# Patient Record
Sex: Female | Born: 1950 | Race: Black or African American | Hispanic: No | Marital: Married | Smoking: Current every day smoker
Health system: Southern US, Community
[De-identification: ages and names within clinical notes are randomized; demographics above are authoritative.]

## PROBLEM LIST (undated history)

## (undated) DIAGNOSIS — M545 Low back pain, unspecified: Secondary | ICD-10-CM

## (undated) DIAGNOSIS — I10 Essential (primary) hypertension: Secondary | ICD-10-CM

## (undated) DIAGNOSIS — F32A Depression, unspecified: Secondary | ICD-10-CM

## (undated) DIAGNOSIS — G8929 Other chronic pain: Secondary | ICD-10-CM

## (undated) DIAGNOSIS — M1990 Unspecified osteoarthritis, unspecified site: Secondary | ICD-10-CM

## (undated) DIAGNOSIS — F329 Major depressive disorder, single episode, unspecified: Secondary | ICD-10-CM

## (undated) DIAGNOSIS — E785 Hyperlipidemia, unspecified: Secondary | ICD-10-CM

---

## 1991-10-01 HISTORY — PX: LUMBAR DISC SURGERY: SHX700

## 1994-09-30 HISTORY — PX: CHOLECYSTECTOMY: SHX55

## 2003-12-06 ENCOUNTER — Encounter: Admission: RE | Admit: 2003-12-06 | Discharge: 2003-12-06 | Payer: Self-pay | Admitting: Neurosurgery

## 2003-12-21 ENCOUNTER — Encounter: Admission: RE | Admit: 2003-12-21 | Discharge: 2003-12-21 | Payer: Self-pay | Admitting: Neurosurgery

## 2004-01-10 ENCOUNTER — Encounter: Admission: RE | Admit: 2004-01-10 | Discharge: 2004-01-10 | Payer: Self-pay | Admitting: Neurosurgery

## 2004-07-19 ENCOUNTER — Ambulatory Visit: Payer: Self-pay | Admitting: Pain Medicine

## 2004-07-25 ENCOUNTER — Ambulatory Visit: Payer: Self-pay | Admitting: Pain Medicine

## 2004-08-20 ENCOUNTER — Ambulatory Visit: Payer: Self-pay | Admitting: Pain Medicine

## 2004-09-06 ENCOUNTER — Ambulatory Visit: Payer: Self-pay | Admitting: Pain Medicine

## 2004-09-17 ENCOUNTER — Ambulatory Visit: Payer: Self-pay | Admitting: Pain Medicine

## 2004-10-22 ENCOUNTER — Ambulatory Visit: Payer: Self-pay | Admitting: Pain Medicine

## 2004-11-22 ENCOUNTER — Ambulatory Visit: Payer: Self-pay | Admitting: Pain Medicine

## 2004-11-28 ENCOUNTER — Ambulatory Visit: Payer: Self-pay | Admitting: Pain Medicine

## 2005-01-08 ENCOUNTER — Ambulatory Visit: Payer: Self-pay | Admitting: Pain Medicine

## 2005-01-16 ENCOUNTER — Ambulatory Visit: Payer: Self-pay | Admitting: Pain Medicine

## 2005-02-20 ENCOUNTER — Ambulatory Visit: Payer: Self-pay | Admitting: Pain Medicine

## 2005-02-27 ENCOUNTER — Ambulatory Visit: Payer: Self-pay | Admitting: Pain Medicine

## 2005-03-08 ENCOUNTER — Encounter: Payer: Self-pay | Admitting: Pain Medicine

## 2005-03-30 ENCOUNTER — Encounter: Payer: Self-pay | Admitting: Pain Medicine

## 2005-04-09 ENCOUNTER — Ambulatory Visit: Payer: Self-pay | Admitting: Pain Medicine

## 2005-04-17 ENCOUNTER — Ambulatory Visit: Payer: Self-pay | Admitting: Pain Medicine

## 2005-04-30 ENCOUNTER — Encounter: Payer: Self-pay | Admitting: Pain Medicine

## 2005-05-09 ENCOUNTER — Ambulatory Visit: Payer: Self-pay | Admitting: Specialist

## 2005-05-16 ENCOUNTER — Ambulatory Visit: Payer: Self-pay | Admitting: Pain Medicine

## 2005-05-22 ENCOUNTER — Ambulatory Visit: Payer: Self-pay | Admitting: Pain Medicine

## 2005-06-18 ENCOUNTER — Ambulatory Visit: Payer: Self-pay | Admitting: Pain Medicine

## 2005-06-26 ENCOUNTER — Ambulatory Visit: Payer: Self-pay | Admitting: Pain Medicine

## 2005-07-18 ENCOUNTER — Ambulatory Visit: Payer: Self-pay | Admitting: Pain Medicine

## 2005-07-24 ENCOUNTER — Ambulatory Visit: Payer: Self-pay | Admitting: Pain Medicine

## 2005-08-15 ENCOUNTER — Ambulatory Visit: Payer: Self-pay | Admitting: Pain Medicine

## 2005-09-12 ENCOUNTER — Ambulatory Visit: Payer: Self-pay | Admitting: Pain Medicine

## 2005-09-25 ENCOUNTER — Ambulatory Visit: Payer: Self-pay | Admitting: Pain Medicine

## 2005-10-08 ENCOUNTER — Ambulatory Visit: Payer: Self-pay | Admitting: Pain Medicine

## 2005-10-16 ENCOUNTER — Ambulatory Visit: Payer: Self-pay | Admitting: Pain Medicine

## 2005-11-05 ENCOUNTER — Ambulatory Visit: Payer: Self-pay | Admitting: Pain Medicine

## 2005-11-13 ENCOUNTER — Ambulatory Visit: Payer: Self-pay | Admitting: Pain Medicine

## 2005-12-05 ENCOUNTER — Ambulatory Visit: Payer: Self-pay | Admitting: Pain Medicine

## 2005-12-11 ENCOUNTER — Ambulatory Visit: Payer: Self-pay | Admitting: Pain Medicine

## 2005-12-26 ENCOUNTER — Ambulatory Visit: Payer: Self-pay | Admitting: Pain Medicine

## 2006-01-20 ENCOUNTER — Ambulatory Visit: Payer: Self-pay | Admitting: Pain Medicine

## 2006-05-27 ENCOUNTER — Ambulatory Visit: Payer: Self-pay | Admitting: Pain Medicine

## 2006-05-28 ENCOUNTER — Ambulatory Visit: Payer: Self-pay | Admitting: Pain Medicine

## 2006-06-24 ENCOUNTER — Ambulatory Visit: Payer: Self-pay | Admitting: Pain Medicine

## 2006-07-21 ENCOUNTER — Ambulatory Visit: Payer: Self-pay | Admitting: Pain Medicine

## 2006-08-26 ENCOUNTER — Ambulatory Visit: Payer: Self-pay | Admitting: Pain Medicine

## 2006-09-10 ENCOUNTER — Ambulatory Visit (HOSPITAL_COMMUNITY): Admission: RE | Admit: 2006-09-10 | Discharge: 2006-09-10 | Payer: Self-pay | Admitting: Neurosurgery

## 2006-09-18 ENCOUNTER — Ambulatory Visit: Payer: Self-pay | Admitting: Pain Medicine

## 2006-10-21 ENCOUNTER — Ambulatory Visit: Payer: Self-pay | Admitting: Pain Medicine

## 2006-11-05 ENCOUNTER — Ambulatory Visit: Payer: Self-pay | Admitting: Pain Medicine

## 2006-11-18 ENCOUNTER — Ambulatory Visit: Payer: Self-pay | Admitting: Pain Medicine

## 2006-12-16 ENCOUNTER — Ambulatory Visit: Payer: Self-pay | Admitting: Pain Medicine

## 2006-12-24 ENCOUNTER — Ambulatory Visit: Payer: Self-pay | Admitting: Pain Medicine

## 2007-01-15 ENCOUNTER — Ambulatory Visit: Payer: Self-pay | Admitting: Pain Medicine

## 2007-02-11 ENCOUNTER — Ambulatory Visit: Payer: Self-pay | Admitting: Pain Medicine

## 2007-03-12 ENCOUNTER — Ambulatory Visit: Payer: Self-pay | Admitting: Pain Medicine

## 2007-04-08 ENCOUNTER — Ambulatory Visit: Payer: Self-pay | Admitting: Pain Medicine

## 2007-05-07 ENCOUNTER — Ambulatory Visit: Payer: Self-pay | Admitting: Pain Medicine

## 2007-05-13 ENCOUNTER — Ambulatory Visit: Payer: Self-pay | Admitting: Pain Medicine

## 2007-06-04 ENCOUNTER — Ambulatory Visit: Payer: Self-pay | Admitting: Pain Medicine

## 2007-07-08 ENCOUNTER — Ambulatory Visit: Payer: Self-pay | Admitting: Pain Medicine

## 2007-08-06 ENCOUNTER — Ambulatory Visit: Payer: Self-pay | Admitting: Pain Medicine

## 2007-08-12 ENCOUNTER — Ambulatory Visit: Payer: Self-pay | Admitting: Pain Medicine

## 2007-09-01 ENCOUNTER — Emergency Department: Payer: Self-pay | Admitting: Emergency Medicine

## 2007-09-10 ENCOUNTER — Ambulatory Visit: Payer: Self-pay | Admitting: Pain Medicine

## 2007-09-28 ENCOUNTER — Ambulatory Visit: Payer: Self-pay | Admitting: Pain Medicine

## 2007-10-12 ENCOUNTER — Ambulatory Visit: Payer: Self-pay | Admitting: Pain Medicine

## 2007-11-10 ENCOUNTER — Ambulatory Visit: Payer: Self-pay | Admitting: Pain Medicine

## 2007-12-10 ENCOUNTER — Ambulatory Visit: Payer: Self-pay | Admitting: Pain Medicine

## 2007-12-16 ENCOUNTER — Ambulatory Visit: Payer: Self-pay | Admitting: Pain Medicine

## 2008-01-05 ENCOUNTER — Ambulatory Visit: Payer: Self-pay | Admitting: Pain Medicine

## 2008-01-11 ENCOUNTER — Ambulatory Visit: Payer: Self-pay | Admitting: Pain Medicine

## 2008-03-08 ENCOUNTER — Ambulatory Visit: Payer: Self-pay | Admitting: Pain Medicine

## 2008-03-14 ENCOUNTER — Ambulatory Visit: Payer: Self-pay | Admitting: Pain Medicine

## 2008-04-05 ENCOUNTER — Ambulatory Visit: Payer: Self-pay | Admitting: Pain Medicine

## 2008-04-18 ENCOUNTER — Ambulatory Visit: Payer: Self-pay | Admitting: Pain Medicine

## 2008-05-03 ENCOUNTER — Ambulatory Visit: Payer: Self-pay | Admitting: Pain Medicine

## 2008-06-02 ENCOUNTER — Ambulatory Visit: Payer: Self-pay | Admitting: Pain Medicine

## 2008-06-15 ENCOUNTER — Ambulatory Visit: Payer: Self-pay | Admitting: Pain Medicine

## 2008-07-05 ENCOUNTER — Ambulatory Visit: Payer: Self-pay | Admitting: Pain Medicine

## 2008-08-04 ENCOUNTER — Ambulatory Visit: Payer: Self-pay | Admitting: Pain Medicine

## 2008-08-10 ENCOUNTER — Ambulatory Visit: Payer: Self-pay | Admitting: Pain Medicine

## 2008-08-30 ENCOUNTER — Ambulatory Visit: Payer: Self-pay | Admitting: Pain Medicine

## 2008-09-14 ENCOUNTER — Ambulatory Visit: Payer: Self-pay | Admitting: Pain Medicine

## 2008-10-03 ENCOUNTER — Ambulatory Visit: Payer: Self-pay | Admitting: Pain Medicine

## 2008-11-01 ENCOUNTER — Ambulatory Visit: Payer: Self-pay | Admitting: Pain Medicine

## 2008-11-09 ENCOUNTER — Ambulatory Visit: Payer: Self-pay | Admitting: Pain Medicine

## 2008-12-06 ENCOUNTER — Ambulatory Visit: Payer: Self-pay | Admitting: Pain Medicine

## 2008-12-28 ENCOUNTER — Ambulatory Visit: Payer: Self-pay | Admitting: Pain Medicine

## 2009-01-10 ENCOUNTER — Ambulatory Visit: Payer: Self-pay | Admitting: Family Medicine

## 2009-01-26 ENCOUNTER — Ambulatory Visit: Payer: Self-pay | Admitting: Pain Medicine

## 2009-02-28 ENCOUNTER — Ambulatory Visit: Payer: Self-pay | Admitting: Pain Medicine

## 2009-03-08 ENCOUNTER — Ambulatory Visit: Payer: Self-pay | Admitting: Pain Medicine

## 2009-03-30 ENCOUNTER — Ambulatory Visit: Payer: Self-pay | Admitting: Pain Medicine

## 2009-05-01 ENCOUNTER — Ambulatory Visit: Payer: Self-pay | Admitting: Pain Medicine

## 2009-05-30 ENCOUNTER — Ambulatory Visit: Payer: Self-pay | Admitting: Pain Medicine

## 2009-06-29 ENCOUNTER — Ambulatory Visit: Payer: Self-pay | Admitting: Pain Medicine

## 2009-07-12 ENCOUNTER — Ambulatory Visit: Payer: Self-pay | Admitting: Pain Medicine

## 2009-07-26 ENCOUNTER — Ambulatory Visit: Payer: Self-pay | Admitting: Pain Medicine

## 2009-08-07 ENCOUNTER — Ambulatory Visit: Payer: Self-pay | Admitting: Pain Medicine

## 2009-08-29 ENCOUNTER — Ambulatory Visit: Payer: Self-pay | Admitting: Pain Medicine

## 2009-08-30 ENCOUNTER — Ambulatory Visit: Payer: Self-pay | Admitting: Pain Medicine

## 2009-09-28 ENCOUNTER — Ambulatory Visit: Payer: Self-pay | Admitting: Pain Medicine

## 2009-10-24 ENCOUNTER — Ambulatory Visit: Payer: Self-pay | Admitting: Pain Medicine

## 2009-11-01 ENCOUNTER — Ambulatory Visit: Payer: Self-pay | Admitting: Pain Medicine

## 2009-11-23 ENCOUNTER — Ambulatory Visit: Payer: Self-pay | Admitting: Pain Medicine

## 2009-12-28 ENCOUNTER — Ambulatory Visit: Payer: Self-pay | Admitting: Pain Medicine

## 2010-01-16 ENCOUNTER — Ambulatory Visit: Payer: Self-pay | Admitting: Family Medicine

## 2010-01-29 ENCOUNTER — Ambulatory Visit: Payer: Self-pay | Admitting: Pain Medicine

## 2010-02-27 ENCOUNTER — Ambulatory Visit: Payer: Self-pay | Admitting: Pain Medicine

## 2010-03-09 ENCOUNTER — Encounter: Payer: Self-pay | Admitting: Pain Medicine

## 2010-03-29 ENCOUNTER — Ambulatory Visit: Payer: Self-pay | Admitting: Pain Medicine

## 2010-03-30 ENCOUNTER — Encounter: Payer: Self-pay | Admitting: Pain Medicine

## 2010-05-01 ENCOUNTER — Ambulatory Visit: Payer: Self-pay | Admitting: Pain Medicine

## 2010-05-09 ENCOUNTER — Ambulatory Visit: Payer: Self-pay | Admitting: Pain Medicine

## 2010-06-05 ENCOUNTER — Ambulatory Visit: Payer: Self-pay | Admitting: Pain Medicine

## 2010-07-03 ENCOUNTER — Ambulatory Visit: Payer: Self-pay | Admitting: Pain Medicine

## 2010-07-31 ENCOUNTER — Ambulatory Visit: Payer: Self-pay | Admitting: Pain Medicine

## 2010-08-08 ENCOUNTER — Ambulatory Visit: Payer: Self-pay | Admitting: Pain Medicine

## 2010-08-30 ENCOUNTER — Ambulatory Visit: Payer: Self-pay | Admitting: Pain Medicine

## 2010-10-02 ENCOUNTER — Ambulatory Visit: Payer: Self-pay | Admitting: Pain Medicine

## 2010-10-17 ENCOUNTER — Ambulatory Visit: Payer: Self-pay | Admitting: Pain Medicine

## 2010-10-30 ENCOUNTER — Ambulatory Visit: Payer: Self-pay | Admitting: Pain Medicine

## 2010-11-27 ENCOUNTER — Ambulatory Visit: Payer: Self-pay | Admitting: Pain Medicine

## 2010-12-05 ENCOUNTER — Ambulatory Visit: Payer: Self-pay | Admitting: Pain Medicine

## 2010-12-27 ENCOUNTER — Ambulatory Visit: Payer: Self-pay | Admitting: Pain Medicine

## 2011-01-22 ENCOUNTER — Ambulatory Visit: Payer: Self-pay | Admitting: Family Medicine

## 2011-01-24 ENCOUNTER — Ambulatory Visit: Payer: Self-pay | Admitting: Pain Medicine

## 2011-02-26 ENCOUNTER — Ambulatory Visit: Payer: Self-pay | Admitting: Pain Medicine

## 2011-03-04 ENCOUNTER — Ambulatory Visit: Payer: Self-pay | Admitting: Pain Medicine

## 2011-04-02 ENCOUNTER — Ambulatory Visit: Payer: Self-pay | Admitting: Pain Medicine

## 2011-04-16 ENCOUNTER — Ambulatory Visit: Payer: Self-pay | Admitting: Pain Medicine

## 2011-04-30 ENCOUNTER — Ambulatory Visit: Payer: Self-pay | Admitting: Pain Medicine

## 2011-05-06 ENCOUNTER — Ambulatory Visit: Payer: Self-pay | Admitting: Pain Medicine

## 2011-05-28 ENCOUNTER — Ambulatory Visit: Payer: Self-pay | Admitting: Pain Medicine

## 2011-06-27 ENCOUNTER — Ambulatory Visit: Payer: Self-pay | Admitting: Pain Medicine

## 2011-07-03 ENCOUNTER — Ambulatory Visit: Payer: Self-pay | Admitting: Pain Medicine

## 2011-07-25 ENCOUNTER — Ambulatory Visit: Payer: Self-pay | Admitting: Pain Medicine

## 2011-08-29 ENCOUNTER — Ambulatory Visit: Payer: Self-pay | Admitting: Pain Medicine

## 2011-10-14 ENCOUNTER — Encounter (HOSPITAL_COMMUNITY): Payer: Self-pay | Admitting: Emergency Medicine

## 2011-10-14 ENCOUNTER — Observation Stay (HOSPITAL_COMMUNITY)
Admission: EM | Admit: 2011-10-14 | Discharge: 2011-10-15 | Disposition: A | Discharge status: Home / Self Care | Payer: Medicare Other | Attending: Emergency Medicine | Admitting: Emergency Medicine

## 2011-10-14 DIAGNOSIS — M549 Dorsalgia, unspecified: Secondary | ICD-10-CM | POA: Insufficient documentation

## 2011-10-14 DIAGNOSIS — R112 Nausea with vomiting, unspecified: Principal | ICD-10-CM

## 2011-10-14 DIAGNOSIS — I10 Essential (primary) hypertension: Secondary | ICD-10-CM | POA: Insufficient documentation

## 2011-10-14 DIAGNOSIS — E860 Dehydration: Secondary | ICD-10-CM

## 2011-10-14 DIAGNOSIS — G8929 Other chronic pain: Secondary | ICD-10-CM | POA: Insufficient documentation

## 2011-10-14 DIAGNOSIS — E119 Type 2 diabetes mellitus without complications: Secondary | ICD-10-CM | POA: Insufficient documentation

## 2011-10-14 HISTORY — DX: Depression, unspecified: F32.A

## 2011-10-14 HISTORY — DX: Essential (primary) hypertension: I10

## 2011-10-14 HISTORY — DX: Major depressive disorder, single episode, unspecified: F32.9

## 2011-10-14 LAB — DIFFERENTIAL
Basophils Absolute: 0.1 10*3/uL (ref 0.0–0.1)
Basophils Relative: 1 % (ref 0–1)
Eosinophils Absolute: 0.3 10*3/uL (ref 0.0–0.7)
Eosinophils Relative: 3 % (ref 0–5)
Lymphocytes Relative: 29 % (ref 12–46)
Lymphs Abs: 2.9 K/uL (ref 0.7–4.0)
Monocytes Absolute: 0.5 K/uL (ref 0.1–1.0)
Monocytes Relative: 5 % (ref 3–12)
Neutro Abs: 6.2 10*3/uL (ref 1.7–7.7)
Neutrophils Relative %: 62 % (ref 43–77)

## 2011-10-14 LAB — URINALYSIS, ROUTINE W REFLEX MICROSCOPIC
Bilirubin Urine: NEGATIVE
Glucose, UA: 250 mg/dL — AB
Hgb urine dipstick: NEGATIVE
Ketones, ur: 15 mg/dL — AB
Leukocytes, UA: NEGATIVE
Nitrite: NEGATIVE
Protein, ur: NEGATIVE mg/dL
Specific Gravity, Urine: 1.014 (ref 1.005–1.030)
Urobilinogen, UA: 0.2 mg/dL (ref 0.0–1.0)
pH: 5 (ref 5.0–8.0)

## 2011-10-14 LAB — BASIC METABOLIC PANEL WITH GFR
BUN: 16 mg/dL (ref 6–23)
Creatinine, Ser: 1.17 mg/dL — ABNORMAL HIGH (ref 0.50–1.10)
GFR calc Af Amer: 57 mL/min — ABNORMAL LOW (ref >90)
GFR calc non Af Amer: 50 mL/min — ABNORMAL LOW (ref >90)
Potassium: 3.5 meq/L (ref 3.5–5.1)

## 2011-10-14 LAB — BASIC METABOLIC PANEL
CO2: 29 mEq/L (ref 19–32)
Calcium: 9.7 mg/dL (ref 8.4–10.5)
Chloride: 92 mEq/L — ABNORMAL LOW (ref 96–112)
Glucose, Bld: 241 mg/dL — ABNORMAL HIGH (ref 70–99)
Sodium: 134 mEq/L — ABNORMAL LOW (ref 135–145)

## 2011-10-14 LAB — CBC
HCT: 41.4 % (ref 36.0–46.0)
Hemoglobin: 14.2 g/dL (ref 12.0–15.0)
MCH: 30.3 pg (ref 26.0–34.0)
MCHC: 34.3 g/dL (ref 30.0–36.0)
MCV: 88.3 fL (ref 78.0–100.0)
Platelets: 292 K/uL (ref 150–400)
RBC: 4.69 MIL/uL (ref 3.87–5.11)
RDW: 12.7 % (ref 11.5–15.5)
WBC: 9.9 K/uL (ref 4.0–10.5)

## 2011-10-14 LAB — GLUCOSE, CAPILLARY: Glucose-Capillary: 214 mg/dL — ABNORMAL HIGH (ref 70–99)

## 2011-10-14 MED ORDER — ONDANSETRON HCL 4 MG/2ML IJ SOLN
4.0000 mg | Freq: Once | INTRAMUSCULAR | Status: AC
  Administered 2011-10-14: 4 mg via INTRAVENOUS
  Filled 2011-10-14: qty 2

## 2011-10-14 MED ORDER — OLMESARTAN-AMLODIPINE-HCTZ 40-10-25 MG PO TABS: 1.0000 | ORAL_TABLET | Freq: Every day | ORAL | Status: DC

## 2011-10-14 MED ORDER — ONDANSETRON HCL 4 MG/2ML IJ SOLN: 4.0000 mg | Freq: Four times a day (QID) | INTRAMUSCULAR | Status: DC | PRN

## 2011-10-14 MED ORDER — DULOXETINE HCL 60 MG PO CPEP
60.0000 mg | ORAL_CAPSULE | ORAL | Status: AC
  Administered 2011-10-14: 60 mg via ORAL
  Filled 2011-10-14: qty 1

## 2011-10-14 MED ORDER — INSULIN DETEMIR 100 UNIT/ML ~~LOC~~ SOLN
60.0000 [IU] | SUBCUTANEOUS | Status: AC
  Administered 2011-10-14: 60 [IU] via SUBCUTANEOUS
  Filled 2011-10-14 (×2): qty 3

## 2011-10-14 MED ORDER — AMLODIPINE BESYLATE 10 MG PO TABS
10.0000 mg | ORAL_TABLET | ORAL | Status: AC
  Administered 2011-10-14: 10 mg via ORAL
  Filled 2011-10-14: qty 1

## 2011-10-14 MED ORDER — SODIUM CHLORIDE 0.9 % IV BOLUS (SEPSIS)
1000.0000 mL | Freq: Once | INTRAVENOUS | Status: AC
  Administered 2011-10-14: 1000 mL via INTRAVENOUS

## 2011-10-14 MED ORDER — ASPIRIN EC 81 MG PO TBEC
81.0000 mg | DELAYED_RELEASE_TABLET | ORAL | Status: AC
  Administered 2011-10-14: 81 mg via ORAL
  Filled 2011-10-14: qty 1

## 2011-10-14 MED ORDER — OLMESARTAN MEDOXOMIL 40 MG PO TABS
40.0000 mg | ORAL_TABLET | ORAL | Status: AC
  Administered 2011-10-14: 40 mg via ORAL
  Filled 2011-10-14: qty 1

## 2011-10-14 MED ORDER — HYDROCHLOROTHIAZIDE 25 MG PO TABS
25.0000 mg | ORAL_TABLET | ORAL | Status: AC
  Administered 2011-10-14: 25 mg via ORAL
  Filled 2011-10-14: qty 1

## 2011-10-14 MED ORDER — METFORMIN HCL 500 MG PO TABS
1000.0000 mg | ORAL_TABLET | ORAL | Status: AC
  Administered 2011-10-14: 1000 mg via ORAL
  Filled 2011-10-14: qty 2

## 2011-10-14 MED ORDER — SODIUM CHLORIDE 0.9 % IV SOLN
INTRAVENOUS | Status: DC
  Administered 2011-10-14 – 2011-10-15 (×2): via INTRAVENOUS

## 2011-10-14 MED ORDER — FAMOTIDINE IN NACL 20-0.9 MG/50ML-% IV SOLN
20.0000 mg | Freq: Once | INTRAVENOUS | Status: AC
  Administered 2011-10-14: 20 mg via INTRAVENOUS
  Filled 2011-10-14: qty 50

## 2011-10-14 MED ORDER — SIMVASTATIN 20 MG PO TABS
20.0000 mg | ORAL_TABLET | ORAL | Status: AC
  Administered 2011-10-14: 20 mg via ORAL
  Filled 2011-10-14: qty 1

## 2011-10-14 MED ORDER — ACETAMINOPHEN 325 MG PO TABS: 650.0000 mg | ORAL_TABLET | ORAL | Status: DC | PRN

## 2011-10-14 NOTE — ED Provider Notes (Signed)
History     CSN: [id number]  Arrival date & time 10/14/11  1526   First MD Initiated Contact with Patient 10/14/11 1859      Chief Complaint  Patient presents with  . Emesis    (Consider location/radiation/quality/duration/timing/severity/associated sxs/prior treatment) HPI Comments: Pt with N/V/D for past 5 days.  No sig abd pain.  Pt has chronic back pain, not worse than usual.  Possibly chills at home, no known fevers.  No CP, SOB, coughing or URI symptoms.  Pt has been able to keep down some oranges, but not for long by her report.  She denies foreign travel, no recent abx use.  She has not taken any specific medications at home for her symptoms.  She has sig h/o HTN and DM as well as chronic pain.  In the last 24 hours, she has only had 2 episodes of vomiting and 1 loose stool which she attributes to not eating much recently.  Her PCP is in [place] with the [facility].  The history is provided by the patient, a relative and the spouse.    Past Medical History  Diagnosis Date  . Hypertension   . Diabetes mellitus   . Back pain     Past Surgical History  Procedure Date  . Back surgery     No family history on file.  History  Substance Use Topics  . Smoking status: Current Everyday Smoker  . Smokeless tobacco: Not on file  . Alcohol Use: No    OB History    Grav Para Term Preterm Abortions TAB SAB Ect Mult Living                  Review of Systems  Constitutional: Positive for appetite change and fatigue.  HENT: Negative for congestion and rhinorrhea.   Respiratory: Negative for cough, chest tightness and shortness of breath.   Cardiovascular: Negative for chest pain.  Gastrointestinal: Positive for nausea, vomiting and diarrhea. Negative for abdominal pain.  Musculoskeletal: Positive for back pain.  Skin: Negative for rash and wound.  Neurological: Negative for headaches.  All other systems reviewed and are negative.    Allergies  Aleve;  Iodine; Neurontin; and Voltaren  Home Medications   Current Outpatient Rx  Name Route Sig Dispense Refill  . ASPIRIN EC 81 MG PO TBEC Oral Take 81 mg by mouth daily.    . DORZOLAMIDE HCL-TIMOLOL MAL 22.3-6.8 MG/ML OP SOLN Both Eyes Place 1 drop into both eyes 2 (two) times daily.    . DULOXETINE HCL 60 MG PO CPEP Oral Take 60 mg by mouth daily.    . FENTANYL 75 MCG/HR TD PT72 Transdermal Place 1 patch onto the skin every 3 (three) days.    . INSULIN DETEMIR 100 UNIT/ML [place] SOLN Subcutaneous Inject 60 Units into the skin at bedtime.    Marland Kitchen METFORMIN HCL 1000 MG PO TABS Oral Take 1,000 mg by mouth 2 (two) times daily with a meal.    . OLMESARTAN-AMLODIPINE-HCTZ 40-10-25 MG PO TABS Oral Take 1 tablet by mouth daily.    . OXYCODONE HCL (ABUSE DETER) 5 MG PO TABS Oral Take 5-10 mg by mouth every 6 (six) hours as needed. For breakthrough pain    . PRAVASTATIN SODIUM 40 MG PO TABS Oral Take 40 mg by mouth daily.      BP 131/78  Pulse 84  Temp(Src) 97.9 F (36.6 C) (Oral)  Resp 16  SpO2 98%  Physical Exam  Nursing note and  vitals reviewed. Constitutional: She appears well-developed and well-nourished.  HENT:  Head: Normocephalic and atraumatic.  Mouth/Throat: Uvula is midline. Mucous membranes are not pale and dry.  Eyes: Pupils are equal, round, and reactive to light. No scleral icterus.  Cardiovascular: Normal rate.   No murmur heard. Pulmonary/Chest: Effort normal. No respiratory distress. She has no wheezes. She has no rales.  Abdominal: Soft. Bowel sounds are increased. There is no tenderness. There is no rebound, no guarding and no CVA tenderness.  Skin: Skin is warm, dry and intact. No rash noted.    ED Course  Procedures (including critical care time)  Labs Reviewed  BASIC METABOLIC PANEL - Abnormal; Notable for the following:    Sodium 134 (*)    Chloride 92 (*)    Glucose, Bld 241 (*)    Creatinine, Ser 1.17 (*)    GFR calc non Af Amer 50 (*)    GFR calc Af Amer 57 (*)     All other components within normal limits  CBC  DIFFERENTIAL  URINALYSIS, ROUTINE W REFLEX MICROSCOPIC   No results found.   No diagnosis found.    MDM  Pt has soft abodmen, no guard or rebound.  Pt is dehydrated on exam.  Pt placed in CDU under dehydration protocol and discussed with PAC Narvaez to follow  And reassess after IVF's and diet is advanced slowly.          Gavin Pound. Oletta Lamas, MD 10/14/11 1958

## 2011-10-14 NOTE — ED Notes (Signed)
Pt st's she has not been able to keep any food down for over 1 week.  St's she has had vomiting and diarrhea.   Also c/o bil feet being swollen for past 4 days.  Family at bedside.  Pt denies any pain

## 2011-10-14 NOTE — ED Notes (Signed)
Pt c/o n/v/d with inability to keep po foods down x 1 week. Pt states last instance of nvd was on Friday. Currently denies pain. Denies nausea or other urinary sx. Pt in no acute distress.

## 2011-10-14 NOTE — ED Notes (Signed)
PT. REPORTS VOMITTING WITH DIARRHEA FOR 4 DAYS WITH FEET SWELLING , GENERALIZED WEAKNESS , NO FEVER OR CHILLS.

## 2011-10-14 NOTE — ED Notes (Signed)
Patient denies pain and is resting comfortably.  

## 2011-10-15 LAB — GLUCOSE, CAPILLARY: Glucose-Capillary: 183 mg/dL — ABNORMAL HIGH (ref 70–99)

## 2011-10-15 MED ORDER — ONDANSETRON HCL 4 MG PO TABS: 4.0000 mg | ORAL_TABLET | Freq: Three times a day (TID) | ORAL | Status: AC | PRN

## 2011-10-15 NOTE — ED Notes (Signed)
Clear liquid tray ordered 

## 2011-10-15 NOTE — ED Provider Notes (Signed)
61 year old female currently in the CDU under dehydration protocol, due to nausea vomiting and diarrhea for the past 4 days. While in the CDU patient is receiving IV fluid and slowly advanced to soft diet. She is able to tolerate this by mouth without difficulty at this time. Patient states she feels much better. She denies any abdominal pain. Patient requests to be discharged. Patient agrees to follow up with her primary care Dr. for further evaluation.  Fayrene Helper, PA-C 10/15/11 [phone]

## 2011-10-15 NOTE — ED Notes (Signed)
Pt up to restroom with her cane. Gait steady.

## 2011-10-18 NOTE — ED Provider Notes (Signed)
History/physical exam/procedure(s) were performed by non-physician practitioner and as supervising physician I was immediately available for consultation/collaboration. I have reviewed all notes and am in agreement with care and plan.   Hilario Quarry, MD 10/18/11 [phone]

## 2011-10-22 ENCOUNTER — Ambulatory Visit: Payer: Self-pay | Admitting: Pain Medicine

## 2011-10-30 ENCOUNTER — Ambulatory Visit: Payer: Self-pay | Admitting: Pain Medicine

## 2011-11-28 ENCOUNTER — Ambulatory Visit: Payer: Self-pay | Admitting: Pain Medicine

## 2011-12-13 ENCOUNTER — Ambulatory Visit: Payer: Self-pay | Admitting: Gastroenterology

## 2011-12-26 ENCOUNTER — Ambulatory Visit: Payer: Self-pay | Admitting: Pain Medicine

## 2012-01-08 ENCOUNTER — Ambulatory Visit: Payer: Self-pay | Admitting: Pain Medicine

## 2012-01-28 ENCOUNTER — Ambulatory Visit: Payer: Self-pay | Admitting: Pain Medicine

## 2012-02-05 ENCOUNTER — Ambulatory Visit: Payer: Self-pay | Admitting: Pain Medicine

## 2012-02-25 ENCOUNTER — Ambulatory Visit: Payer: Self-pay | Admitting: Pain Medicine

## 2012-03-02 ENCOUNTER — Ambulatory Visit: Payer: Self-pay | Admitting: Pain Medicine

## 2012-03-26 ENCOUNTER — Ambulatory Visit: Payer: Self-pay | Admitting: Pain Medicine

## 2012-04-09 ENCOUNTER — Ambulatory Visit: Payer: Self-pay | Admitting: Family Medicine

## 2012-04-10 ENCOUNTER — Ambulatory Visit: Payer: Self-pay | Admitting: Family Medicine

## 2012-04-23 ENCOUNTER — Ambulatory Visit: Payer: Self-pay | Admitting: Family Medicine

## 2012-04-28 ENCOUNTER — Ambulatory Visit: Payer: Self-pay | Admitting: Pain Medicine

## 2012-04-30 ENCOUNTER — Ambulatory Visit: Payer: Self-pay | Admitting: Family Medicine

## 2012-05-26 ENCOUNTER — Ambulatory Visit: Payer: Self-pay | Admitting: Pain Medicine

## 2012-05-31 ENCOUNTER — Ambulatory Visit: Payer: Self-pay | Admitting: Family Medicine

## 2012-06-16 ENCOUNTER — Ambulatory Visit: Payer: Self-pay | Admitting: Pain Medicine

## 2012-06-22 ENCOUNTER — Ambulatory Visit: Payer: Self-pay | Admitting: Pain Medicine

## 2012-07-03 ENCOUNTER — Ambulatory Visit: Payer: Self-pay | Admitting: Pain Medicine

## 2012-07-23 ENCOUNTER — Ambulatory Visit: Payer: Self-pay | Admitting: Pain Medicine

## 2012-08-05 ENCOUNTER — Ambulatory Visit: Payer: Self-pay | Admitting: Pain Medicine

## 2012-09-02 ENCOUNTER — Ambulatory Visit: Payer: Self-pay | Admitting: Pain Medicine

## 2012-09-28 ENCOUNTER — Ambulatory Visit: Payer: Self-pay | Admitting: Pain Medicine

## 2012-10-26 ENCOUNTER — Ambulatory Visit: Payer: Self-pay | Admitting: Family Medicine

## 2012-10-29 ENCOUNTER — Ambulatory Visit: Payer: Self-pay | Admitting: Pain Medicine

## 2012-12-24 ENCOUNTER — Ambulatory Visit: Payer: Self-pay | Admitting: Pain Medicine

## 2013-01-21 ENCOUNTER — Ambulatory Visit: Payer: Self-pay | Admitting: Pain Medicine

## 2013-02-23 ENCOUNTER — Ambulatory Visit: Payer: Self-pay | Admitting: Pain Medicine

## 2013-04-20 ENCOUNTER — Ambulatory Visit: Payer: Self-pay | Admitting: Pain Medicine

## 2013-05-18 ENCOUNTER — Ambulatory Visit: Payer: Self-pay | Admitting: Pain Medicine

## 2013-06-16 ENCOUNTER — Ambulatory Visit: Payer: Self-pay | Admitting: Pain Medicine

## 2013-07-19 ENCOUNTER — Ambulatory Visit: Payer: Self-pay | Admitting: Pain Medicine

## 2013-08-19 ENCOUNTER — Ambulatory Visit: Payer: Self-pay | Admitting: Pain Medicine

## 2013-08-27 ENCOUNTER — Ambulatory Visit: Payer: Self-pay | Admitting: Family Medicine

## 2013-08-30 ENCOUNTER — Encounter (HOSPITAL_COMMUNITY): Payer: Self-pay | Admitting: Emergency Medicine

## 2013-08-30 ENCOUNTER — Emergency Department (HOSPITAL_COMMUNITY): Payer: Medicare Other

## 2013-08-30 ENCOUNTER — Emergency Department (HOSPITAL_COMMUNITY)
Admission: EM | Admit: 2013-08-30 | Discharge: 2013-08-31 | Disposition: A | Discharge status: Home / Self Care | Payer: Medicare Other | Attending: Emergency Medicine | Admitting: Emergency Medicine

## 2013-08-30 DIAGNOSIS — Z79899 Other long term (current) drug therapy: Secondary | ICD-10-CM | POA: Insufficient documentation

## 2013-08-30 DIAGNOSIS — F17200 Nicotine dependence, unspecified, uncomplicated: Secondary | ICD-10-CM | POA: Insufficient documentation

## 2013-08-30 DIAGNOSIS — Z8739 Personal history of other diseases of the musculoskeletal system and connective tissue: Secondary | ICD-10-CM | POA: Insufficient documentation

## 2013-08-30 DIAGNOSIS — Z7982 Long term (current) use of aspirin: Secondary | ICD-10-CM | POA: Insufficient documentation

## 2013-08-30 DIAGNOSIS — I10 Essential (primary) hypertension: Secondary | ICD-10-CM | POA: Insufficient documentation

## 2013-08-30 DIAGNOSIS — Z794 Long term (current) use of insulin: Secondary | ICD-10-CM | POA: Insufficient documentation

## 2013-08-30 DIAGNOSIS — N200 Calculus of kidney: Secondary | ICD-10-CM | POA: Insufficient documentation

## 2013-08-30 DIAGNOSIS — R112 Nausea with vomiting, unspecified: Secondary | ICD-10-CM | POA: Insufficient documentation

## 2013-08-30 DIAGNOSIS — E119 Type 2 diabetes mellitus without complications: Secondary | ICD-10-CM | POA: Insufficient documentation

## 2013-08-30 DIAGNOSIS — Z8659 Personal history of other mental and behavioral disorders: Secondary | ICD-10-CM | POA: Insufficient documentation

## 2013-08-30 LAB — CBC WITH DIFFERENTIAL/PLATELET
Basophils Absolute: 0.1 10*3/uL (ref 0.0–0.1)
Eosinophils Relative: 3 % (ref 0–5)
HCT: 40.7 % (ref 36.0–46.0)
Lymphocytes Relative: 38 % (ref 12–46)
MCH: 30.1 pg (ref 26.0–34.0)
MCHC: 34.9 g/dL (ref 30.0–36.0)
MCV: 86.2 fL (ref 78.0–100.0)
Monocytes Absolute: 0.6 10*3/uL (ref 0.1–1.0)
RDW: 13.3 % (ref 11.5–15.5)
WBC: 9.4 10*3/uL (ref 4.0–10.5)

## 2013-08-30 LAB — URINALYSIS, ROUTINE W REFLEX MICROSCOPIC
Bilirubin Urine: NEGATIVE
Ketones, ur: NEGATIVE mg/dL
Nitrite: NEGATIVE
pH: 6 (ref 5.0–8.0)

## 2013-08-30 LAB — BASIC METABOLIC PANEL
BUN: 21 mg/dL (ref 6–23)
CO2: 25 mEq/L (ref 19–32)
Chloride: 97 mEq/L (ref 96–112)
Creatinine, Ser: 1.49 mg/dL — ABNORMAL HIGH (ref 0.50–1.10)
Glucose, Bld: 182 mg/dL — ABNORMAL HIGH (ref 70–99)

## 2013-08-30 LAB — URINE MICROSCOPIC-ADD ON

## 2013-08-30 MED ORDER — MORPHINE SULFATE 4 MG/ML IJ SOLN
4.0000 mg | Freq: Once | INTRAMUSCULAR | Status: AC
  Administered 2013-08-30: 4 mg via INTRAVENOUS
  Filled 2013-08-30: qty 1

## 2013-08-30 MED ORDER — SODIUM CHLORIDE 0.9 % IV BOLUS (SEPSIS)
1000.0000 mL | Freq: Once | INTRAVENOUS | Status: AC
  Administered 2013-08-30: 1000 mL via INTRAVENOUS

## 2013-08-30 MED ORDER — ONDANSETRON HCL 4 MG/2ML IJ SOLN
4.0000 mg | Freq: Once | INTRAMUSCULAR | Status: AC
  Administered 2013-08-30: 4 mg via INTRAVENOUS
  Filled 2013-08-30: qty 2

## 2013-08-30 NOTE — ED Notes (Signed)
Pt c/o left flank pain x 3 wks; CT scan on Friday--told tonight has kidney stone right side; scheduled for MD appt on 12/11; states started vomiting today; no fever

## 2013-08-30 NOTE — ED Provider Notes (Signed)
CSN: [id number]     Arrival date & time 08/30/13  1908 History   First MD Initiated Contact with Patient 08/30/13 2109     Chief Complaint  Patient presents with  . Flank Pain   (Consider location/radiation/quality/duration/timing/severity/associated sxs/prior Treatment) HPI  This is a 62 year old female who presents with flank pain. She reports 3 weeks of right-sided flank pain. She had a CT scan on Friday and was told she had a kidney stone. She is unsure of how big it is. She has an appointment with urology on December 11. She reports nonbilious, nonbloody emesis today. She denies any fevers. She reports persistent pain despite using oxycodone. She denies any hematuria or dysuria. Patient currently reports her pain as 9/10.  Past Medical History  Diagnosis Date  . Hypertension   . Diabetes mellitus   . Back pain   . Depression    Past Surgical History  Procedure Laterality Date  . Back surgery     No family history on file. History  Substance Use Topics  . Smoking status: Current Every Day Smoker  . Smokeless tobacco: Not on file  . Alcohol Use: No   OB History   Grav Para Term Preterm Abortions TAB SAB Ect Mult Living                 Review of Systems  Constitutional: Negative for fever.  Respiratory: Negative for cough, chest tightness and shortness of breath.   Cardiovascular: Negative for chest pain.  Gastrointestinal: Positive for nausea and vomiting. Negative for abdominal pain and diarrhea.  Genitourinary: Positive for flank pain. Negative for dysuria and hematuria.  Neurological: Negative for headaches.  All other systems reviewed and are negative.    Allergies  Diclofenac sodium; Iodine; Naproxen sodium; and Neurontin  Home Medications   Current Outpatient Rx  Name  Route  Sig  Dispense  Refill  . aspirin EC 81 MG tablet   Oral   Take 81 mg by mouth daily.         . carvedilol (COREG) 3.125 MG tablet   Oral   Take 3.125 mg by mouth 2 (two)  times daily with a meal.         . dorzolamide-timolol (COSOPT) 22.3-6.8 MG/ML ophthalmic solution   Both Eyes   Place 1 drop into both eyes 2 (two) times daily.         . fentaNYL (DURAGESIC - DOSED MCG/HR) 100 MCG/HR   Transdermal   Place 100 mcg onto the skin every 3 (three) days. Used with patch.         . fentaNYL (DURAGESIC - DOSED MCG/HR) 75 MCG/HR   Transdermal   Place 1 patch onto the skin every 3 (three) days. Used with patch         . Insulin Glargine (LANTUS SOLOSTAR) 100 UNIT/ML SOPN   Subcutaneous   Inject 60 Units into the skin every morning.         . metFORMIN (GLUCOPHAGE) 1000 MG tablet   Oral   Take 1,000 mg by mouth 2 (two) times daily with a meal.         . Multiple Vitamins-Minerals (MULTIVITAMIN WITH MINERALS) tablet   Oral   Take 1 tablet by mouth daily.         . Olmesartan-Amlodipine-HCTZ (TRIBENZOR) 40-10-25 MG TABS   Oral   Take 1 tablet by mouth daily.         . Oxycodone HCl 10 MG TABS  Oral   Take 10-20 mg by mouth every 4 (four) hours as needed (breakthrough pain).         . pravastatin (PRAVACHOL) 40 MG tablet   Oral   Take 40 mg by mouth daily.         . saxagliptin HCl (ONGLYZA) 5 MG TABS tablet   Oral   Take 5 mg by mouth daily.         . tamsulosin (FLOMAX) 0.4 MG CAPS capsule   Oral   Take 1 capsule (0.4 mg total) by mouth daily.   30 capsule   0    BP 133/74  Pulse 81  Temp(Src) 98.5 F (36.9 C) (Oral)  Resp 20  SpO2 97% Physical Exam  Nursing note and vitals reviewed. Constitutional: She is oriented to person, place, and time. She appears well-developed and well-nourished.  HENT:  Head: Normocephalic and atraumatic.  Eyes: Pupils are equal, round, and reactive to light.  Neck: Neck supple.  Cardiovascular: Normal rate, regular rhythm and normal heart sounds.   No murmur heard. Pulmonary/Chest: Effort normal. No respiratory distress.  Abdominal: Soft. Bowel sounds are normal.  She exhibits no distension. There is no tenderness. There is no rebound and no guarding.  Genitourinary:  No CVA tenderness  Neurological: She is alert and oriented to person, place, and time.  Skin: Skin is warm and dry.  Psychiatric: She has a normal mood and affect.    ED Course  Procedures (including critical care time) Labs Review Labs Reviewed  BASIC METABOLIC PANEL - Abnormal; Notable for the following:    Glucose, Bld 182 (*)    Creatinine, Ser 1.49 (*)    GFR calc non Af Amer 36 (*)    GFR calc Af Amer 42 (*)    All other components within normal limits  URINALYSIS, ROUTINE W REFLEX MICROSCOPIC - Abnormal; Notable for the following:    APPearance CLOUDY (*)    Glucose, UA 100 (*)    Leukocytes, UA TRACE (*)    All other components within normal limits  URINE MICROSCOPIC-ADD ON - Abnormal; Notable for the following:    Squamous Epithelial / LPF MANY (*)    Bacteria, UA FEW (*)    All other components within normal limits  CBC WITH DIFFERENTIAL   Imaging Review Ct Abdomen Pelvis Wo Contrast  08/30/2013   CLINICAL DATA:  Worsening right-sided flank pain.  EXAM: CT ABDOMEN AND PELVIS WITHOUT CONTRAST  TECHNIQUE: Multidetector CT imaging of the abdomen and pelvis was performed following the standard protocol without intravenous contrast.  COMPARISON:  None.  FINDINGS: Lower Chest: Clear lung bases. Mild cardiomegaly without pericardial or pleural effusion.  Abdomen/Pelvis: Normal liver, spleen, stomach. Mild pancreatic atrophy. Cholecystectomy without biliary ductal dilatation. Normal adrenal glands. 6 mm low-density left renal lesion which is likely a cyst. Mild right renal atrophy. No hydronephrosis. No hydroureter or ureteric calculi. A right-sided bladder base or ureterovesicular junction stone measures 1.0 cm on image 66.  Advanced aortic and branch vessel atherosclerosis. No retroperitoneal or retrocrural adenopathy. Normal colon, appendix, and terminal ileum. Normal small  bowel without abdominal ascites. No pelvic adenopathy. Normal uterus and adnexa, without significant free pelvic fluid.  Bones/Musculoskeletal: Advanced bilateral hip osteoarthritis. 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  IMPRESSION: 1. 1.0 cm right bladder base or ureterovesicular junction calculus. No significant urinary tract obstruction. 2. Advanced aortic and branch vessel atherosclerosis.   Electronically Signed   By: Jeronimo Greaves M.D.   On: 08/30/2013 23:30  EKG Interpretation   None       MDM   1. Kidney stone    Patient presents with persistent right-sided flank pain. Was diagnosed with a kidney stone on Friday but states she's had worsening of pain. She is nontoxic-appearing on exam. Vital signs are within normal limits. Lab work is notable for mild bump in the patient's creatinine.  I do not have access to the patient's CT scan nor do I know how big the stone is. Given this, patient will be sent for another CT scan. CT scan shows a 1 cm at the bladder or UVJ junction. Patient has had complete resolution of her pain while in the ER. I suspect she may have passed a stone into her bladder. However, given the size of the stone will touch base with urology. Anticipate the patient to be discharged home with urology followup.    Shon Baton, MD 08/31/13 [phone]

## 2013-08-31 MED ORDER — TAMSULOSIN HCL 0.4 MG PO CAPS: 0.4000 mg | ORAL_CAPSULE | Freq: Every day | ORAL | Status: DC

## 2013-09-01 ENCOUNTER — Other Ambulatory Visit: Payer: Self-pay

## 2013-09-01 ENCOUNTER — Ambulatory Visit (HOSPITAL_BASED_OUTPATIENT_CLINIC_OR_DEPARTMENT_OTHER)
Admission: RE | Admit: 2013-09-01 | Discharge: 2013-09-01 | Disposition: A | Discharge status: Home / Self Care | Payer: Medicare Other | Source: Ambulatory Visit | Attending: Urology | Admitting: Urology

## 2013-09-01 ENCOUNTER — Ambulatory Visit (HOSPITAL_BASED_OUTPATIENT_CLINIC_OR_DEPARTMENT_OTHER): Payer: Medicare Other | Admitting: Anesthesiology

## 2013-09-01 ENCOUNTER — Encounter (HOSPITAL_BASED_OUTPATIENT_CLINIC_OR_DEPARTMENT_OTHER): Payer: Self-pay | Admitting: *Deleted

## 2013-09-01 ENCOUNTER — Other Ambulatory Visit: Payer: Self-pay | Admitting: Urology

## 2013-09-01 ENCOUNTER — Encounter (HOSPITAL_BASED_OUTPATIENT_CLINIC_OR_DEPARTMENT_OTHER): Payer: Medicare Other | Admitting: Anesthesiology

## 2013-09-01 ENCOUNTER — Encounter (HOSPITAL_BASED_OUTPATIENT_CLINIC_OR_DEPARTMENT_OTHER)
Admission: RE | Disposition: A | Discharge status: Home / Self Care | Payer: Self-pay | Source: Ambulatory Visit | Attending: Urology

## 2013-09-01 DIAGNOSIS — N210 Calculus in bladder: Secondary | ICD-10-CM | POA: Insufficient documentation

## 2013-09-01 DIAGNOSIS — Z7982 Long term (current) use of aspirin: Secondary | ICD-10-CM | POA: Insufficient documentation

## 2013-09-01 DIAGNOSIS — Q6231 Congenital ureterocele, orthotopic: Secondary | ICD-10-CM | POA: Insufficient documentation

## 2013-09-01 DIAGNOSIS — I10 Essential (primary) hypertension: Secondary | ICD-10-CM | POA: Insufficient documentation

## 2013-09-01 DIAGNOSIS — E7800 Pure hypercholesterolemia, unspecified: Secondary | ICD-10-CM | POA: Insufficient documentation

## 2013-09-01 DIAGNOSIS — Z79899 Other long term (current) drug therapy: Secondary | ICD-10-CM | POA: Insufficient documentation

## 2013-09-01 DIAGNOSIS — F17200 Nicotine dependence, unspecified, uncomplicated: Secondary | ICD-10-CM | POA: Insufficient documentation

## 2013-09-01 DIAGNOSIS — Z794 Long term (current) use of insulin: Secondary | ICD-10-CM | POA: Insufficient documentation

## 2013-09-01 DIAGNOSIS — N201 Calculus of ureter: Secondary | ICD-10-CM | POA: Insufficient documentation

## 2013-09-01 DIAGNOSIS — E119 Type 2 diabetes mellitus without complications: Secondary | ICD-10-CM | POA: Insufficient documentation

## 2013-09-01 HISTORY — DX: Low back pain, unspecified: M54.50

## 2013-09-01 HISTORY — DX: Unspecified osteoarthritis, unspecified site: M19.90

## 2013-09-01 HISTORY — DX: Other chronic pain: G89.29

## 2013-09-01 HISTORY — PX: CYSTOSCOPY WITH URETEROSCOPY: SHX5123

## 2013-09-01 HISTORY — DX: Low back pain: M54.5

## 2013-09-01 HISTORY — DX: Hyperlipidemia, unspecified: E78.5

## 2013-09-01 LAB — POCT I-STAT 4, (NA,K, GLUC, HGB,HCT)
HCT: 41 % (ref 36.0–46.0)
Hemoglobin: 13.9 g/dL (ref 12.0–15.0)
Sodium: 136 mEq/L (ref 135–145)

## 2013-09-01 LAB — GLUCOSE, CAPILLARY: Glucose-Capillary: 126 mg/dL — ABNORMAL HIGH (ref 70–99)

## 2013-09-01 SURGERY — CYSTOSCOPY WITH URETEROSCOPY
Anesthesia: General | Site: Bladder | Laterality: Right

## 2013-09-01 MED ORDER — CIPROFLOXACIN IN D5W 400 MG/200ML IV SOLN
INTRAVENOUS | Status: AC
  Filled 2013-09-01: qty 200

## 2013-09-01 MED ORDER — PHENAZOPYRIDINE HCL 200 MG PO TABS
200.0000 mg | ORAL_TABLET | Freq: Three times a day (TID) | ORAL | Status: DC | PRN
Start: 2013-09-01 — End: 2014-05-31

## 2013-09-01 MED ORDER — HYDROMORPHONE HCL PF 1 MG/ML IJ SOLN
0.2500 mg | INTRAMUSCULAR | Status: DC | PRN
  Filled 2013-09-01: qty 1

## 2013-09-01 MED ORDER — FENTANYL CITRATE 0.05 MG/ML IJ SOLN
INTRAMUSCULAR | Status: AC
  Filled 2013-09-01: qty 2

## 2013-09-01 MED ORDER — CIPROFLOXACIN IN D5W 400 MG/200ML IV SOLN
400.0000 mg | INTRAVENOUS | Status: AC
  Administered 2013-09-01: 400 mg via INTRAVENOUS
  Filled 2013-09-01: qty 200

## 2013-09-01 MED ORDER — PROMETHAZINE HCL 25 MG/ML IJ SOLN
6.2500 mg | INTRAMUSCULAR | Status: DC | PRN
  Filled 2013-09-01: qty 1

## 2013-09-01 MED ORDER — SODIUM CHLORIDE 0.9 % IJ SOLN
3.0000 mL | INTRAMUSCULAR | Status: DC | PRN
  Filled 2013-09-01: qty 3

## 2013-09-01 MED ORDER — PROPOFOL 10 MG/ML IV BOLUS
INTRAVENOUS | Status: DC | PRN
  Administered 2013-09-01: 50 mg via INTRAVENOUS
  Administered 2013-09-01: 150 mg via INTRAVENOUS

## 2013-09-01 MED ORDER — EPHEDRINE SULFATE 50 MG/ML IJ SOLN
INTRAMUSCULAR | Status: DC | PRN
  Administered 2013-09-01: 10 mg via INTRAVENOUS

## 2013-09-01 MED ORDER — SODIUM CHLORIDE 0.9 % IR SOLN
Status: DC | PRN
  Administered 2013-09-01: 1000 mL via INTRAVESICAL

## 2013-09-01 MED ORDER — LACTATED RINGERS IV SOLN
INTRAVENOUS | Status: DC
  Administered 2013-09-01 (×2): via INTRAVENOUS
  Filled 2013-09-01: qty 1000

## 2013-09-01 MED ORDER — MEPERIDINE HCL 25 MG/ML IJ SOLN
6.2500 mg | INTRAMUSCULAR | Status: DC | PRN
  Filled 2013-09-01: qty 1

## 2013-09-01 MED ORDER — BELLADONNA ALKALOIDS-OPIUM 16.2-60 MG RE SUPP
RECTAL | Status: AC
  Filled 2013-09-01: qty 1

## 2013-09-01 MED ORDER — OXYCODONE HCL 5 MG PO TABS
ORAL_TABLET | ORAL | Status: AC
  Filled 2013-09-01: qty 1

## 2013-09-01 MED ORDER — FENTANYL CITRATE 0.05 MG/ML IJ SOLN
INTRAMUSCULAR | Status: DC | PRN
  Administered 2013-09-01 (×4): 50 ug via INTRAVENOUS

## 2013-09-01 MED ORDER — ONDANSETRON HCL 4 MG/2ML IJ SOLN
INTRAMUSCULAR | Status: DC | PRN
  Administered 2013-09-01: 4 mg via INTRAVENOUS

## 2013-09-01 MED ORDER — ACETAMINOPHEN 325 MG PO TABS
650.0000 mg | ORAL_TABLET | ORAL | Status: DC | PRN
  Filled 2013-09-01: qty 2

## 2013-09-01 MED ORDER — BELLADONNA ALKALOIDS-OPIUM 16.2-60 MG RE SUPP
RECTAL | Status: DC | PRN
  Administered 2013-09-01: 1 via RECTAL

## 2013-09-01 MED ORDER — SODIUM CHLORIDE 0.9 % IV SOLN
250.0000 mL | INTRAVENOUS | Status: DC | PRN
  Filled 2013-09-01: qty 250

## 2013-09-01 MED ORDER — ONDANSETRON HCL 4 MG/2ML IJ SOLN
4.0000 mg | Freq: Four times a day (QID) | INTRAMUSCULAR | Status: DC | PRN
  Filled 2013-09-01: qty 2

## 2013-09-01 MED ORDER — OXYCODONE HCL 5 MG/5ML PO SOLN
5.0000 mg | Freq: Once | ORAL | Status: DC | PRN
  Filled 2013-09-01: qty 5

## 2013-09-01 MED ORDER — ACETAMINOPHEN 650 MG RE SUPP
650.0000 mg | RECTAL | Status: DC | PRN
  Filled 2013-09-01: qty 1

## 2013-09-01 MED ORDER — SODIUM CHLORIDE 0.9 % IJ SOLN
3.0000 mL | Freq: Two times a day (BID) | INTRAMUSCULAR | Status: DC
  Filled 2013-09-01: qty 3

## 2013-09-01 MED ORDER — FENTANYL CITRATE 0.05 MG/ML IJ SOLN
25.0000 ug | INTRAMUSCULAR | Status: DC | PRN
  Filled 2013-09-01: qty 1

## 2013-09-01 MED ORDER — OXYCODONE HCL 5 MG PO TABS
5.0000 mg | ORAL_TABLET | Freq: Once | ORAL | Status: DC | PRN
  Filled 2013-09-01: qty 1

## 2013-09-01 MED ORDER — OXYCODONE HCL 5 MG PO TABS
5.0000 mg | ORAL_TABLET | ORAL | Status: DC | PRN
  Administered 2013-09-01: 5 mg via ORAL
  Filled 2013-09-01: qty 2

## 2013-09-01 MED ORDER — LIDOCAINE HCL (CARDIAC) 20 MG/ML IV SOLN
INTRAVENOUS | Status: DC | PRN
  Administered 2013-09-01: 50 mg via INTRAVENOUS

## 2013-09-01 SURGICAL SUPPLY — 40 items
BAG DRAIN URO-CYSTO SKYTR STRL (DRAIN) ×2 IMPLANT
BAG DRN UROCATH (DRAIN) ×1
BASKET LASER NITINOL 1.9FR (BASKET) IMPLANT
BASKET SEGURA 3FR (UROLOGICAL SUPPLIES) IMPLANT
BASKET STNLS GEMINI 4WIRE 3FR (BASKET) IMPLANT
BASKET ZERO TIP NITINOL 2.4FR (BASKET) IMPLANT
BRUSH URET BIOPSY 3F (UROLOGICAL SUPPLIES) IMPLANT
BSKT STON RTRVL 120 1.9FR (BASKET)
BSKT STON RTRVL GEM 120X11 3FR (BASKET)
BSKT STON RTRVL ZERO TP 2.4FR (BASKET)
CANISTER SUCT LVC 12 LTR MEDI- (MISCELLANEOUS) ×1 IMPLANT
CATH URET 5FR 28IN CONE TIP (BALLOONS)
CATH URET 5FR 28IN OPEN ENDED (CATHETERS) IMPLANT
CATH URET 5FR 70CM CONE TIP (BALLOONS) IMPLANT
CLOTH BEACON ORANGE TIMEOUT ST (SAFETY) ×2 IMPLANT
DRAPE CAMERA CLOSED 9X96 (DRAPES) ×2 IMPLANT
ELECT HF RESECT BIPO 24F 45 ND (CUTTING LOOP) ×1 IMPLANT
ELECT REM PT RETURN 9FT ADLT (ELECTROSURGICAL)
ELECTRODE REM PT RTRN 9FT ADLT (ELECTROSURGICAL) IMPLANT
FIBER LASER FLEXIVA 1000 (UROLOGICAL SUPPLIES) IMPLANT
FIBER LASER FLEXIVA 200 (UROLOGICAL SUPPLIES) IMPLANT
FIBER LASER FLEXIVA 365 (UROLOGICAL SUPPLIES) IMPLANT
FIBER LASER FLEXIVA 550 (UROLOGICAL SUPPLIES) IMPLANT
GLOVE SURG SS PI 8.0 STRL IVOR (GLOVE) ×2 IMPLANT
GOWN STRL REIN XL XLG (GOWN DISPOSABLE) ×2 IMPLANT
GOWN XL W/COTTON TOWEL STD (GOWNS) ×3 IMPLANT
GUIDEWIRE 0.038 PTFE COATED (WIRE) IMPLANT
GUIDEWIRE ANG ZIPWIRE 038X150 (WIRE) IMPLANT
GUIDEWIRE STR DUAL SENSOR (WIRE) ×2 IMPLANT
IV NS 1000ML (IV SOLUTION) ×4
IV NS 1000ML BAXH (IV SOLUTION) IMPLANT
IV NS IRRIG 3000ML ARTHROMATIC (IV SOLUTION) ×1 IMPLANT
KIT BALLIN UROMAX 15FX10 (LABEL) IMPLANT
KIT BALLN UROMAX 15FX4 (MISCELLANEOUS) IMPLANT
KIT BALLN UROMAX 26 75X4 (MISCELLANEOUS)
LOOP ELECTRODE MEDIUM (ELECTRODE) ×1 IMPLANT
PACK CYSTOSCOPY (CUSTOM PROCEDURE TRAY) ×2 IMPLANT
SET HIGH PRES BAL DIL (LABEL)
SHEATH ACCESS URETERAL 38CM (SHEATH) IMPLANT
SHEATH ACCESS URETERAL 54CM (SHEATH) IMPLANT

## 2013-09-01 NOTE — H&P (Signed)
ctive Problems Problems   1. Ureteral stone (592.1)  History of Present Illness     Lorraine Jones is a 62 yo BF who is sent from the ER for a 10mm right distal/bladder stone seen on CT on 12/1.  She had the onset about 2 weeks ago of right flank pain.  The pain was severe with nausea and vomiting.  She has had no gross hematuria.  She did have frequency and urgency.   She had some hesitancy intermittantly.  She had no fever.   She has not had prior stones.  She denies a history of UTI's or GU surgery.   She continues to have pain.   Past Medical History Problems   1. History of arthritis (V13.4)  2. History of backache (V13.59)  3. History of depression (V11.8)  4. History of diabetes mellitus (V12.29)  5. History of hypercholesterolemia (V12.29)  6. History of hypertension (V12.59)  Surgical History Problems   1. History of Back Surgery  2. History of Cholecystectomy  Current Meds  1. Aspirin 81 MG Oral Tablet;  Therapy: (Recorded:03Dec2014) to Recorded  2. Coreg 3.125 MG Oral Tablet;  Therapy: (Recorded:03Dec2014) to Recorded  3. Cosopt 22.3-6.8 MG/ML Ophthalmic Solution;  Therapy: (Recorded:03Dec2014) to Recorded  4. Duragesic-100 PT72;  Therapy: (Recorded:03Dec2014) to Recorded  5. Duragesic-75 PT72;  Therapy: (Recorded:03Dec2014) to Recorded  6. Lantus SoloStar 100 UNIT/ML SOLN;  Therapy: (Recorded:03Dec2014) to Recorded  7. MetFORMIN HCl - 1000 MG Oral Tablet;  Therapy: (Recorded:03Dec2014) to Recorded  8. Onglyza 5 MG Oral Tablet;  Therapy: (Recorded:03Dec2014) to Recorded  9. OxyCODONE HCl - 10 MG Oral Tablet; TAKE TABLET  PRN;  Therapy: (Recorded:03Dec2014) to Recorded  10. Pravastatin Sodium 40 MG Oral Tablet;   Therapy: (Recorded:03Dec2014) to Recorded  11. Tribenzor 40-5-25 MG Oral Tablet;   Therapy: (Recorded:03Dec2014) to Recorded  Allergies Medication   1. Aleve TABS  2. Lodine TABS  3. Neurontin TABS  4. Voltaren TBEC  Family History Problems   1.  Family history of Dialysis : Mother  Social History Problems    Denied: History of Alcohol use   Caffeine use (V49.89)   3-4   Current every day smoker (305.1)   smokes 1 ppd; smoked for 10 years and quit for 15 years; resumed smoking for 2 years,     current smoker   Disabled   Married   Mother deceased   Number of children   1 son 2 daughters  Review of Systems  Genitourinary: urinary frequency, urinary urgency, nocturia, incontinence, urinary hesitancy and urinary stream starts and stops.  Gastrointestinal: flank pain and heartburn.  Cardiovascular: no chest pain.  Respiratory: no shortness of breath.  Musculoskeletal: back pain.    Vitals Vital Signs [Data Includes: Last 1 Day]  Recorded: 03Dec2014 09:05AM  Height: 4 ft 11 in Weight: 196 lb  BMI Calculated: 39.59 BSA Calculated: 1.83 Blood Pressure: 153 / 80 Temperature: 97.9 F Heart Rate: 85  Physical Exam Constitutional: Well nourished and well developed . No acute distress.  ENT:. The ears and nose are normal in appearance.  Neck: The appearance of the neck is normal and no neck mass is present.  Pulmonary: No respiratory distress and normal respiratory rhythm and effort.  Cardiovascular: Heart rate and rhythm are normal . No peripheral edema.  Abdomen: The abdomen is mildly obese. No masses are palpated. Mild tenderness in the RLQ is present. moderate right CVA tenderness and no left CVA tenderness. No hernias are palpable. No hepatosplenomegaly noted.  Lymphatics: The supraclavicular, femoral and inguinal nodes are not enlarged or tender.  Skin: Normal skin turgor, no visible rash and no visible skin lesions.  Neuro/Psych:. Mood and affect are appropriate.    Results/Data Urine [Data Includes: Last 1 Day]   03Dec2014  COLOR YELLOW   APPEARANCE CLOUDY   SPECIFIC GRAVITY 1.020   pH 5.5   GLUCOSE 500 mg/dL  BILIRUBIN NEG   KETONE NEG mg/dL  BLOOD NEG   PROTEIN NEG mg/dL  UROBILINOGEN 0.2 mg/dL   NITRITE NEG   LEUKOCYTE ESTERASE NEG   SQUAMOUS EPITHELIAL/HPF FEW   WBC 0-2 WBC/hpf  RBC 0-2 RBC/hpf  BACTERIA NONE SEEN   CRYSTALS NONE SEEN   CASTS NONE SEEN    Old records or history reviewed: ER records reviewed.  The following images/tracing/specimen were independently visualized:  I have reviewed her CT films and report. KUB today shows no change in the position of the right distal stone which is about 10mm. She has a clip in the RUQ but no other significant findings.  The following clinical lab reports were reviewed:  UA and labs from ER reviewed.    Assessment Assessed   1. Ureteral stone (592.1)   She has a 10mm right UVJ stone with pain and would like treatment.   Plan Health Maintenance   1. UA With REFLEX; Status:Resulted - Requires Verification;   Done: 03Dec2014 08:56AM Ureteral stone   2. KUB; Status:Active; Requested for:03Dec2014;   3. Follow-up Schedule Surgery Office  Follow-up  Status: Hold For - Appointment   Requested for: 03Dec2014   I am going to add her on for stone extraction today and reviewed the risks of bleeding, infection, ureteral and bladder injury, need for stent and second procedures, thrombotic events and anesthetic complications.   Discussion/Summary  CC: Dr. Autumn Messing at [facility].

## 2013-09-01 NOTE — Brief Op Note (Signed)
09/01/2013  2:43 PM  PATIENT:  Lorraine Jones  62 y.o. female  PRE-OPERATIVE DIAGNOSIS:  right uvj stone  POST-OPERATIVE DIAGNOSIS:  Right ureterocele with distal stone  PROCEDURE:  Procedure(s) with comments: CYSTOSCOPY WITH Unroofing of right ureterocele and ureteral stone extraction.   collins knife  SURGEON:  Surgeon(s) and Role:    * Bjorn Pippin, MD - Primary  PHYSICIAN ASSISTANT:   ASSISTANTS: none   ANESTHESIA:   general  EBL:  Total I/O In: 700 [I.V.:700] Out: -   BLOOD ADMINISTERED:none  DRAINS: none   LOCAL MEDICATIONS USED:  NONE  SPECIMEN:  Source of Specimen:  stone from ureterocele.  DISPOSITION OF SPECIMEN:  to family  COUNTS:  YES  TOURNIQUET:  * No tourniquets in log *  DICTATION: .Other Dictation: Dictation Number [id number]  PLAN OF CARE: Discharge to home after PACU  PATIENT DISPOSITION:  PACU - hemodynamically stable.   Delay start of Pharmacological VTE agent (>24hrs) due to surgical blood loss or risk of bleeding: not applicable

## 2013-09-01 NOTE — Anesthesia Procedure Notes (Signed)
Procedure Name: LMA Insertion Date/Time: 09/01/2013 2:25 PM Performed by: Maris Berger T Pre-anesthesia Checklist: Patient identified, Emergency Drugs available, Suction available and Patient being monitored Patient Re-evaluated:Patient Re-evaluated prior to inductionOxygen Delivery Method: Circle System Utilized Preoxygenation: Pre-oxygenation with 100% oxygen Intubation Type: IV induction Ventilation: Mask ventilation without difficulty LMA: LMA inserted LMA Size: 4.0 Number of attempts: 1 Airway Equipment and Method: bite block Placement Confirmation: positive ETCO2 Tube secured with: Tape Dental Injury: Teeth and Oropharynx as per pre-operative assessment

## 2013-09-01 NOTE — Transfer of Care (Signed)
Immediate Anesthesia Transfer of Care Note  Patient: Lorraine Jones  Procedure(s) Performed: Procedure(s) with comments: CYSTOSCOPY WITH UNROOFING AND REMOVING OF STONE WITH GYRUS COLLINS KNIFE.  (Right) - cysto, right uretersoscopy and stone extraction   collins knife  Patient Location: PACU  Anesthesia Type:General  Level of Consciousness: awake and oriented  Airway & Oxygen Therapy: Patient Spontanous Breathing and Patient connected to nasal cannula oxygen  Post-op Assessment: Report given to PACU RN  Post vital signs: Reviewed and stable  Complications: No apparent anesthesia complications

## 2013-09-01 NOTE — Anesthesia Preprocedure Evaluation (Addendum)
Anesthesia Evaluation  Patient identified by MRN, date of birth, ID band Patient awake    Reviewed: Allergy & Precautions, H&P , NPO status , Patient's Chart, lab work & pertinent test results  Airway       Dental  (+) Dental Advisory Given   Pulmonary Current Smoker,          Cardiovascular hypertension, Pt. on medications negative cardio ROS      Neuro/Psych PSYCHIATRIC DISORDERS Depression negative neurological ROS     GI/Hepatic negative GI ROS, Neg liver ROS,   Endo/Other  diabetes, Type 2, Insulin Dependent and Oral Hypoglycemic Agents  Renal/GU negative Renal ROS     Musculoskeletal negative musculoskeletal ROS (+)   Abdominal   Peds  Hematology negative hematology ROS (+)   Anesthesia Other Findings   Reproductive/Obstetrics negative OB ROS                        Anesthesia Physical Anesthesia Plan  ASA: II  Anesthesia Plan: General   Post-op Pain Management:    Induction: Intravenous  Airway Management Planned: LMA  Additional Equipment:   Intra-op Plan:   Post-operative Plan: Extubation in OR  Informed Consent: I have reviewed the patients History and Physical, chart, labs and discussed the procedure including the risks, benefits and alternatives for the proposed anesthesia with the patient or authorized representative who has indicated his/her understanding and acceptance.   Dental advisory given  Plan Discussed with: CRNA  Anesthesia Plan Comments:        Anesthesia Quick Evaluation

## 2013-09-02 ENCOUNTER — Encounter (HOSPITAL_BASED_OUTPATIENT_CLINIC_OR_DEPARTMENT_OTHER): Payer: Self-pay | Admitting: Urology

## 2013-09-02 NOTE — Op Note (Signed)
NAMEMIRELY, PANGLE                 ACCOUNT NO.:  [id number]  MEDICAL RECORD NO.:  [id number]  LOCATION:                                 FACILITY:  PHYSICIAN:  Excell Seltzer. Annabell Howells, M.D.    DATE OF BIRTH:  26-Oct-1950  DATE OF PROCEDURE:  09/01/2013 DATE OF DISCHARGE:  09/01/2013                              OPERATIVE REPORT   PROCEDURE:  Cystoscopy with unroofing of right ureterocele and extraction of right ureteral stone.  PREOPERATIVE DIAGNOSIS:  Right distal ureteral stone.  POSTOPERATIVE DIAGNOSIS:  Right distal ureteral stone, but the stone was within ureterocele.  SURGEON:  Excell Seltzer. Annabell Howells, M.D.  ANESTHESIA:  General.  SPECIMENS:  Stone.  DRAINS:  None.  COMPLICATIONS:  None.  INDICATIONS:  Ms. Weingartner is a 62 year old African American female with recent right flank pain.  She was found on CT to have a 1-cm right distal ureteral stone.  While an ureterocele sac was not evident on CT, clinically it behaved like a stone within the ureterocele.  It was felt that cystoscopy with possible ureteroscopy versus routine ureterocele was indicated.  FINDINGS OF PROCEDURE:  She was given Cipro.  She was taken to the operating room where general anesthetic was induced.  She was placed in the lithotomy position and fitted with PAS hose.  She was prepped with chlorhexidine and draped in usual sterile fashion.  Cystoscopy was performed using a 22-French scope and 12-degree lens. Examination revealed a normal urethra.  The bladder wall was smooth and pale without tumor, stones, or inflammation.  The left ureteral orifice was unremarkable.  The right ureteral orifice was medial on top of a bulging ureterocele consistent with CT findings.  Once cystoscopy had been complete, a 28-French gyrus continuous flow resectoscope sheath was inserted.  This was fitted with a [employer], and an Rowan handle with a 12-degree lens.  The ureteral orifice opening was identified and the tip of the  Collins knife was inserted, the ureterocele was then unroofed for approximately 1.5 cm laterally from the orifice.  Once unroofed, the stone was readily exposed and I was able to tease it into the bladder using the tip of the [employer].  The [employer] was then used to grasp the stone and it was retrieved intact from the bladder.  Inspection revealed no active bleeding.  I did fulgurate around the ureterocele rim to ensure hemostasis.  At this point, the bladder was drained.  B and O suppository was placed.  She was taken down from lithotomy position.  Her anesthetic was reversed.  She was moved to recovery room in a stable condition.  There were no complications.     Excell Seltzer. Annabell Howells, M.D.     JJW/MEDQ  D:  09/01/2013  T:  09/02/2013  Job:  [id number]

## 2013-09-02 NOTE — Anesthesia Postprocedure Evaluation (Signed)
  Anesthesia Post-op Note  Patient: Lorraine Jones  Procedure(s) Performed: Procedure(s) (LRB): CYSTOSCOPY WITH UNROOFING  RIGHT URETEROCELE AND URETERAL STONE REMOVED  WITH GYRUS COLLINS KNIFE.  (Right)  Patient Location: PACU  Anesthesia Type: General  Level of Consciousness: awake and alert   Airway and Oxygen Therapy: Patient Spontanous Breathing  Post-op Pain: mild  Post-op Assessment: Post-op Vital signs reviewed, Patient's Cardiovascular Status Stable, Respiratory Function Stable, Patent Airway and No signs of Nausea or vomiting  Last Vitals:  Filed Vitals:   09/01/13 1620  BP: 150/70  Pulse: 80  Temp: 36.3 C  Resp: 18    Post-op Vital Signs: stable   Complications: No apparent anesthesia complications

## 2013-09-16 ENCOUNTER — Ambulatory Visit: Payer: Self-pay | Admitting: Pain Medicine

## 2013-10-14 ENCOUNTER — Ambulatory Visit: Payer: Self-pay | Admitting: Pain Medicine

## 2013-11-30 ENCOUNTER — Ambulatory Visit: Payer: Self-pay | Admitting: Pain Medicine

## 2013-12-20 ENCOUNTER — Ambulatory Visit: Payer: Self-pay | Admitting: Family Medicine

## 2013-12-24 ENCOUNTER — Ambulatory Visit: Payer: Self-pay | Admitting: Family Medicine

## 2014-01-11 ENCOUNTER — Ambulatory Visit: Payer: Self-pay | Admitting: Pain Medicine

## 2014-02-10 ENCOUNTER — Ambulatory Visit: Payer: Self-pay | Admitting: Pain Medicine

## 2014-02-28 ENCOUNTER — Ambulatory Visit: Payer: Self-pay | Admitting: Pain Medicine

## 2014-03-09 ENCOUNTER — Ambulatory Visit: Payer: Self-pay | Admitting: Pain Medicine

## 2014-04-07 ENCOUNTER — Ambulatory Visit: Payer: Self-pay | Admitting: Pain Medicine

## 2014-04-27 ENCOUNTER — Ambulatory Visit: Payer: Self-pay | Admitting: Pain Medicine

## 2014-05-10 ENCOUNTER — Ambulatory Visit: Payer: Self-pay | Admitting: Pain Medicine

## 2014-05-31 ENCOUNTER — Encounter (HOSPITAL_COMMUNITY): Payer: Self-pay | Admitting: Emergency Medicine

## 2014-05-31 ENCOUNTER — Inpatient Hospital Stay (HOSPITAL_COMMUNITY)
Admission: EM | Admit: 2014-05-31 | Discharge: 2014-06-03 | DRG: 641 | Disposition: A | Discharge status: Home / Self Care | Payer: Medicare HMO | Attending: Internal Medicine | Admitting: Internal Medicine

## 2014-05-31 ENCOUNTER — Emergency Department (HOSPITAL_COMMUNITY): Payer: Medicare HMO

## 2014-05-31 DIAGNOSIS — E1165 Type 2 diabetes mellitus with hyperglycemia: Secondary | ICD-10-CM

## 2014-05-31 DIAGNOSIS — I10 Essential (primary) hypertension: Secondary | ICD-10-CM | POA: Diagnosis present

## 2014-05-31 DIAGNOSIS — E785 Hyperlipidemia, unspecified: Secondary | ICD-10-CM | POA: Diagnosis present

## 2014-05-31 DIAGNOSIS — E871 Hypo-osmolality and hyponatremia: Principal | ICD-10-CM | POA: Diagnosis present

## 2014-05-31 DIAGNOSIS — F17200 Nicotine dependence, unspecified, uncomplicated: Secondary | ICD-10-CM | POA: Diagnosis present

## 2014-05-31 DIAGNOSIS — F329 Major depressive disorder, single episode, unspecified: Secondary | ICD-10-CM | POA: Diagnosis present

## 2014-05-31 DIAGNOSIS — N390 Urinary tract infection, site not specified: Secondary | ICD-10-CM

## 2014-05-31 DIAGNOSIS — R1011 Right upper quadrant pain: Secondary | ICD-10-CM

## 2014-05-31 DIAGNOSIS — Z23 Encounter for immunization: Secondary | ICD-10-CM

## 2014-05-31 DIAGNOSIS — E119 Type 2 diabetes mellitus without complications: Secondary | ICD-10-CM

## 2014-05-31 DIAGNOSIS — Z7982 Long term (current) use of aspirin: Secondary | ICD-10-CM

## 2014-05-31 DIAGNOSIS — K5909 Other constipation: Secondary | ICD-10-CM | POA: Diagnosis present

## 2014-05-31 DIAGNOSIS — Z794 Long term (current) use of insulin: Secondary | ICD-10-CM

## 2014-05-31 DIAGNOSIS — F3289 Other specified depressive episodes: Secondary | ICD-10-CM | POA: Diagnosis present

## 2014-05-31 DIAGNOSIS — IMO0002 Reserved for concepts with insufficient information to code with codable children: Secondary | ICD-10-CM | POA: Diagnosis present

## 2014-05-31 DIAGNOSIS — T4275XA Adverse effect of unspecified antiepileptic and sedative-hypnotic drugs, initial encounter: Secondary | ICD-10-CM | POA: Diagnosis present

## 2014-05-31 DIAGNOSIS — R1084 Generalized abdominal pain: Secondary | ICD-10-CM

## 2014-05-31 DIAGNOSIS — R1013 Epigastric pain: Secondary | ICD-10-CM

## 2014-05-31 DIAGNOSIS — G8929 Other chronic pain: Secondary | ICD-10-CM | POA: Diagnosis present

## 2014-05-31 DIAGNOSIS — IMO0001 Reserved for inherently not codable concepts without codable children: Secondary | ICD-10-CM | POA: Diagnosis present

## 2014-05-31 DIAGNOSIS — E1143 Type 2 diabetes mellitus with diabetic autonomic (poly)neuropathy: Secondary | ICD-10-CM | POA: Diagnosis present

## 2014-05-31 LAB — LIPASE, BLOOD: LIPASE: 47 U/L (ref 11–59)

## 2014-05-31 LAB — URINALYSIS, ROUTINE W REFLEX MICROSCOPIC
BILIRUBIN URINE: NEGATIVE
Glucose, UA: >1000 mg/dL — AB
Ketones, ur: NEGATIVE mg/dL
NITRITE: NEGATIVE
Protein, ur: NEGATIVE mg/dL
SPECIFIC GRAVITY, URINE: 1.036 — AB (ref 1.005–1.030)
UROBILINOGEN UA: 0.2 mg/dL (ref 0.0–1.0)
pH: 5.5 (ref 5.0–8.0)

## 2014-05-31 LAB — COMPREHENSIVE METABOLIC PANEL
ALT: 22 U/L (ref 0–35)
ANION GAP: 17 — AB (ref 5–15)
AST: 35 U/L (ref 0–37)
Albumin: 4 g/dL (ref 3.5–5.2)
Alkaline Phosphatase: 94 U/L (ref 39–117)
BUN: 12 mg/dL (ref 6–23)
CALCIUM: 10.4 mg/dL (ref 8.4–10.5)
CO2: 26 mEq/L (ref 19–32)
Chloride: 88 mEq/L — ABNORMAL LOW (ref 96–112)
Creatinine, Ser: 0.87 mg/dL (ref 0.50–1.10)
GFR calc Af Amer: 80 mL/min — ABNORMAL LOW (ref >90)
GFR calc non Af Amer: 69 mL/min — ABNORMAL LOW (ref >90)
Glucose, Bld: 391 mg/dL — ABNORMAL HIGH (ref 70–99)
Potassium: 4.2 mEq/L (ref 3.7–5.3)
Sodium: 131 mEq/L — ABNORMAL LOW (ref 137–147)
TOTAL PROTEIN: 8.3 g/dL (ref 6.0–8.3)
Total Bilirubin: 0.3 mg/dL (ref 0.3–1.2)

## 2014-05-31 LAB — CBC WITH DIFFERENTIAL/PLATELET
BASOS PCT: 0 % (ref 0–1)
Basophils Absolute: 0 10*3/uL (ref 0.0–0.1)
EOS ABS: 0.2 10*3/uL (ref 0.0–0.7)
Eosinophils Relative: 2 % (ref 0–5)
HEMATOCRIT: 43.5 % (ref 36.0–46.0)
Hemoglobin: 15.3 g/dL — ABNORMAL HIGH (ref 12.0–15.0)
Lymphocytes Relative: 27 % (ref 12–46)
Lymphs Abs: 2.6 10*3/uL (ref 0.7–4.0)
MCH: 30.2 pg (ref 26.0–34.0)
MCHC: 35.2 g/dL (ref 30.0–36.0)
MCV: 85.8 fL (ref 78.0–100.0)
MONOS PCT: 6 % (ref 3–12)
Monocytes Absolute: 0.6 10*3/uL (ref 0.1–1.0)
Neutro Abs: 6.1 10*3/uL (ref 1.7–7.7)
Neutrophils Relative %: 65 % (ref 43–77)
Platelets: 428 10*3/uL — ABNORMAL HIGH (ref 150–400)
RBC: 5.07 MIL/uL (ref 3.87–5.11)
RDW: 12.3 % (ref 11.5–15.5)
WBC: 9.5 10*3/uL (ref 4.0–10.5)

## 2014-05-31 LAB — URINE MICROSCOPIC-ADD ON

## 2014-05-31 LAB — I-STAT TROPONIN, ED: TROPONIN I, POC: 0 ng/mL (ref 0.00–0.08)

## 2014-05-31 MED ORDER — ONDANSETRON HCL 4 MG/2ML IJ SOLN
4.0000 mg | Freq: Once | INTRAMUSCULAR | Status: AC
  Administered 2014-06-01: 4 mg via INTRAVENOUS
  Filled 2014-05-31: qty 2

## 2014-05-31 MED ORDER — MORPHINE SULFATE 4 MG/ML IJ SOLN
4.0000 mg | Freq: Once | INTRAMUSCULAR | Status: AC
Start: 2014-05-31 — End: 2014-06-01
  Administered 2014-06-01: 4 mg via INTRAVENOUS
  Filled 2014-05-31: qty 1

## 2014-05-31 MED ORDER — IOHEXOL 300 MG/ML  SOLN
50.0000 mL | Freq: Once | INTRAMUSCULAR | Status: AC | PRN
  Administered 2014-05-31: 50 mL via ORAL

## 2014-05-31 MED ORDER — SODIUM CHLORIDE 0.9 % IV BOLUS (SEPSIS)
1000.0000 mL | Freq: Once | INTRAVENOUS | Status: AC
  Administered 2014-06-01: 1000 mL via INTRAVENOUS

## 2014-05-31 NOTE — ED Notes (Addendum)
Pt states she has had mid ABD pain x 2 weeks. Pt also states she has not had a bowel movement in 2 weeks. Pt has had a reduced appetite and did have some n/v over a week ago but denies any recent n/v. Pt states that she went to see her PCP last week and yesterday and was given Prilosec and Xantac.

## 2014-06-01 ENCOUNTER — Encounter (HOSPITAL_COMMUNITY): Payer: Self-pay | Admitting: Radiology

## 2014-06-01 DIAGNOSIS — Z7982 Long term (current) use of aspirin: Secondary | ICD-10-CM | POA: Diagnosis not present

## 2014-06-01 DIAGNOSIS — Z794 Long term (current) use of insulin: Secondary | ICD-10-CM | POA: Diagnosis not present

## 2014-06-01 DIAGNOSIS — K5909 Other constipation: Secondary | ICD-10-CM | POA: Diagnosis present

## 2014-06-01 DIAGNOSIS — IMO0002 Reserved for concepts with insufficient information to code with codable children: Secondary | ICD-10-CM | POA: Diagnosis present

## 2014-06-01 DIAGNOSIS — N390 Urinary tract infection, site not specified: Secondary | ICD-10-CM | POA: Diagnosis present

## 2014-06-01 DIAGNOSIS — I10 Essential (primary) hypertension: Secondary | ICD-10-CM | POA: Diagnosis present

## 2014-06-01 DIAGNOSIS — Z23 Encounter for immunization: Secondary | ICD-10-CM | POA: Diagnosis not present

## 2014-06-01 DIAGNOSIS — T4275XA Adverse effect of unspecified antiepileptic and sedative-hypnotic drugs, initial encounter: Secondary | ICD-10-CM | POA: Diagnosis present

## 2014-06-01 DIAGNOSIS — E1165 Type 2 diabetes mellitus with hyperglycemia: Secondary | ICD-10-CM

## 2014-06-01 DIAGNOSIS — E785 Hyperlipidemia, unspecified: Secondary | ICD-10-CM | POA: Diagnosis present

## 2014-06-01 DIAGNOSIS — G8929 Other chronic pain: Secondary | ICD-10-CM | POA: Diagnosis present

## 2014-06-01 DIAGNOSIS — IMO0001 Reserved for inherently not codable concepts without codable children: Secondary | ICD-10-CM | POA: Diagnosis present

## 2014-06-01 DIAGNOSIS — F3289 Other specified depressive episodes: Secondary | ICD-10-CM | POA: Diagnosis present

## 2014-06-01 DIAGNOSIS — F17200 Nicotine dependence, unspecified, uncomplicated: Secondary | ICD-10-CM | POA: Diagnosis present

## 2014-06-01 DIAGNOSIS — E871 Hypo-osmolality and hyponatremia: Secondary | ICD-10-CM | POA: Diagnosis present

## 2014-06-01 DIAGNOSIS — F329 Major depressive disorder, single episode, unspecified: Secondary | ICD-10-CM | POA: Diagnosis present

## 2014-06-01 DIAGNOSIS — E1143 Type 2 diabetes mellitus with diabetic autonomic (poly)neuropathy: Secondary | ICD-10-CM | POA: Diagnosis present

## 2014-06-01 LAB — BASIC METABOLIC PANEL
ANION GAP: 16 — AB (ref 5–15)
BUN: 12 mg/dL (ref 6–23)
CO2: 25 mEq/L (ref 19–32)
CREATININE: 0.79 mg/dL (ref 0.50–1.10)
Calcium: 9.4 mg/dL (ref 8.4–10.5)
Chloride: 88 mEq/L — ABNORMAL LOW (ref 96–112)
GFR, EST NON AFRICAN AMERICAN: 87 mL/min — AB (ref >90)
Glucose, Bld: 364 mg/dL — ABNORMAL HIGH (ref 70–99)
Potassium: 4.1 mEq/L (ref 3.7–5.3)
SODIUM: 129 meq/L — AB (ref 137–147)

## 2014-06-01 LAB — CBC
HEMATOCRIT: 39.6 % (ref 36.0–46.0)
Hemoglobin: 13.6 g/dL (ref 12.0–15.0)
MCH: 29.5 pg (ref 26.0–34.0)
MCHC: 34.3 g/dL (ref 30.0–36.0)
MCV: 85.9 fL (ref 78.0–100.0)
Platelets: 370 10*3/uL (ref 150–400)
RBC: 4.61 MIL/uL (ref 3.87–5.11)
RDW: 12.2 % (ref 11.5–15.5)
WBC: 8.4 10*3/uL (ref 4.0–10.5)

## 2014-06-01 LAB — GLUCOSE, CAPILLARY
GLUCOSE-CAPILLARY: 245 mg/dL — AB (ref 70–99)
Glucose-Capillary: 321 mg/dL — ABNORMAL HIGH (ref 70–99)
Glucose-Capillary: 274 mg/dL — ABNORMAL HIGH (ref 70–99)
Glucose-Capillary: 220 mg/dL — ABNORMAL HIGH (ref 70–99)

## 2014-06-01 LAB — CREATININE, SERUM
CREATININE: 0.77 mg/dL (ref 0.50–1.10)
GFR calc Af Amer: >90 mL/min (ref >90)
GFR, EST NON AFRICAN AMERICAN: 88 mL/min — AB (ref >90)

## 2014-06-01 LAB — HEMOGLOBIN A1C
Hgb A1c MFr Bld: 9.3 % — ABNORMAL HIGH (ref <5.7)
Mean Plasma Glucose: 220 mg/dL — ABNORMAL HIGH (ref <117)

## 2014-06-01 LAB — TSH: TSH: 0.849 u[IU]/mL (ref 0.350–4.500)

## 2014-06-01 MED ORDER — ONDANSETRON HCL 4 MG PO TABS: 4.0000 mg | ORAL_TABLET | Freq: Four times a day (QID) | ORAL | Status: DC | PRN

## 2014-06-01 MED ORDER — ONDANSETRON HCL 4 MG/2ML IJ SOLN: 4.0000 mg | Freq: Four times a day (QID) | INTRAMUSCULAR | Status: DC | PRN

## 2014-06-01 MED ORDER — IRBESARTAN 300 MG PO TABS
300.0000 mg | ORAL_TABLET | Freq: Every day | ORAL | Status: DC
  Administered 2014-06-01 – 2014-06-03 (×3): 300 mg via ORAL
  Filled 2014-06-01 (×3): qty 1

## 2014-06-01 MED ORDER — ADULT MULTIVITAMIN W/MINERALS CH
1.0000 | ORAL_TABLET | Freq: Every day | ORAL | Status: DC
  Administered 2014-06-01 – 2014-06-03 (×3): 1 via ORAL
  Filled 2014-06-01 (×3): qty 1

## 2014-06-01 MED ORDER — BISACODYL 10 MG RE SUPP: 10.0000 mg | Freq: Every day | RECTAL | Status: DC | PRN

## 2014-06-01 MED ORDER — METFORMIN HCL 500 MG PO TABS
1000.0000 mg | ORAL_TABLET | Freq: Two times a day (BID) | ORAL | Status: DC
  Administered 2014-06-02 – 2014-06-03 (×2): 1000 mg via ORAL
  Filled 2014-06-01 (×7): qty 2

## 2014-06-01 MED ORDER — CEFTRIAXONE SODIUM 1 G IJ SOLR
1.0000 g | Freq: Once | INTRAMUSCULAR | Status: AC
  Administered 2014-06-01: 1 g via INTRAVENOUS
  Filled 2014-06-01: qty 10

## 2014-06-01 MED ORDER — DOCUSATE SODIUM 100 MG PO CAPS
100.0000 mg | ORAL_CAPSULE | Freq: Two times a day (BID) | ORAL | Status: DC
  Administered 2014-06-01 – 2014-06-03 (×5): 100 mg via ORAL
  Filled 2014-06-01 (×6): qty 1

## 2014-06-01 MED ORDER — IOHEXOL 300 MG/ML  SOLN
100.0000 mL | Freq: Once | INTRAMUSCULAR | Status: AC | PRN
  Administered 2014-06-01: 100 mL via INTRAVENOUS

## 2014-06-01 MED ORDER — OLMESARTAN-AMLODIPINE-HCTZ 40-10-25 MG PO TABS: 1.0000 | ORAL_TABLET | Freq: Every day | ORAL | Status: DC

## 2014-06-01 MED ORDER — DORZOLAMIDE HCL-TIMOLOL MAL 2-0.5 % OP SOLN
1.0000 [drp] | Freq: Two times a day (BID) | OPHTHALMIC | Status: DC
  Administered 2014-06-01 – 2014-06-03 (×5): 1 [drp] via OPHTHALMIC
  Filled 2014-06-01: qty 10

## 2014-06-01 MED ORDER — LINAGLIPTIN 5 MG PO TABS
5.0000 mg | ORAL_TABLET | Freq: Every day | ORAL | Status: DC
  Administered 2014-06-01 – 2014-06-03 (×3): 5 mg via ORAL
  Filled 2014-06-01 (×3): qty 1

## 2014-06-01 MED ORDER — ASPIRIN EC 81 MG PO TBEC
81.0000 mg | DELAYED_RELEASE_TABLET | Freq: Every day | ORAL | Status: DC
  Administered 2014-06-01 – 2014-06-03 (×3): 81 mg via ORAL
  Filled 2014-06-01 (×3): qty 1

## 2014-06-01 MED ORDER — POLYETHYLENE GLYCOL 3350 17 G PO PACK
17.0000 g | PACK | Freq: Every day | ORAL | Status: DC | PRN
  Filled 2014-06-01: qty 1

## 2014-06-01 MED ORDER — INSULIN ASPART 100 UNIT/ML ~~LOC~~ SOLN
0.0000 [IU] | Freq: Three times a day (TID) | SUBCUTANEOUS | Status: DC
  Administered 2014-06-01: 06:00:00 via SUBCUTANEOUS
  Administered 2014-06-01: 5 [IU] via SUBCUTANEOUS
  Administered 2014-06-01: 8 [IU] via SUBCUTANEOUS
  Administered 2014-06-01: 5 [IU] via SUBCUTANEOUS
  Administered 2014-06-02: 2 [IU] via SUBCUTANEOUS
  Administered 2014-06-02 (×2): 8 [IU] via SUBCUTANEOUS
  Administered 2014-06-03: 2 [IU] via SUBCUTANEOUS

## 2014-06-01 MED ORDER — OXYCODONE HCL 5 MG PO TABS
5.0000 mg | ORAL_TABLET | ORAL | Status: DC | PRN
  Administered 2014-06-01 – 2014-06-02 (×4): 5 mg via ORAL
  Filled 2014-06-01 (×4): qty 1

## 2014-06-01 MED ORDER — HYDROCHLOROTHIAZIDE 25 MG PO TABS
25.0000 mg | ORAL_TABLET | Freq: Every day | ORAL | Status: DC
  Administered 2014-06-01 – 2014-06-03 (×3): 25 mg via ORAL
  Filled 2014-06-01 (×3): qty 1

## 2014-06-01 MED ORDER — HEPARIN SODIUM (PORCINE) 5000 UNIT/ML IJ SOLN
5000.0000 [IU] | Freq: Three times a day (TID) | INTRAMUSCULAR | Status: DC
  Administered 2014-06-01 – 2014-06-03 (×7): 5000 [IU] via SUBCUTANEOUS
  Filled 2014-06-01 (×10): qty 1

## 2014-06-01 MED ORDER — SODIUM CHLORIDE 0.9 % IV SOLN
INTRAVENOUS | Status: DC
  Administered 2014-06-01 – 2014-06-02 (×4): via INTRAVENOUS

## 2014-06-01 MED ORDER — MORPHINE SULFATE 2 MG/ML IJ SOLN
1.0000 mg | INTRAMUSCULAR | Status: DC | PRN
  Administered 2014-06-01 – 2014-06-02 (×3): 1 mg via INTRAVENOUS
  Filled 2014-06-01 (×3): qty 1

## 2014-06-01 MED ORDER — FOLIC ACID 1 MG PO TABS
1.0000 mg | ORAL_TABLET | Freq: Every day | ORAL | Status: DC
  Administered 2014-06-01 – 2014-06-03 (×3): 1 mg via ORAL
  Filled 2014-06-01 (×3): qty 1

## 2014-06-01 MED ORDER — MORPHINE SULFATE 4 MG/ML IJ SOLN
4.0000 mg | Freq: Once | INTRAMUSCULAR | Status: AC
  Administered 2014-06-01: 4 mg via INTRAVENOUS
  Filled 2014-06-01: qty 1

## 2014-06-01 MED ORDER — POLYETHYLENE GLYCOL 3350 17 G PO PACK
17.0000 g | PACK | Freq: Every day | ORAL | Status: DC
  Administered 2014-06-01: 17 g via ORAL
  Filled 2014-06-01 (×3): qty 1

## 2014-06-01 MED ORDER — SIMVASTATIN 20 MG PO TABS: 20.0000 mg | ORAL_TABLET | Freq: Every day | ORAL | Status: DC

## 2014-06-01 MED ORDER — MAGNESIUM HYDROXIDE 400 MG/5ML PO SUSP
30.0000 mL | Freq: Once | ORAL | Status: AC
  Administered 2014-06-01: 30 mL via ORAL
  Filled 2014-06-01: qty 30

## 2014-06-01 MED ORDER — VITAMIN B-1 100 MG PO TABS
100.0000 mg | ORAL_TABLET | Freq: Every day | ORAL | Status: DC
  Administered 2014-06-01 – 2014-06-03 (×3): 100 mg via ORAL
  Filled 2014-06-01 (×3): qty 1

## 2014-06-01 MED ORDER — DEXTROSE 5 % IV SOLN
1.0000 g | INTRAVENOUS | Status: DC
  Administered 2014-06-02 (×2): 1 g via INTRAVENOUS
  Filled 2014-06-01 (×2): qty 10

## 2014-06-01 MED ORDER — PRAVASTATIN SODIUM 40 MG PO TABS
40.0000 mg | ORAL_TABLET | Freq: Every day | ORAL | Status: DC
  Administered 2014-06-01 – 2014-06-02 (×2): 40 mg via ORAL
  Filled 2014-06-01 (×3): qty 1

## 2014-06-01 MED ORDER — AMLODIPINE BESYLATE 10 MG PO TABS
10.0000 mg | ORAL_TABLET | Freq: Every day | ORAL | Status: DC
  Administered 2014-06-01 – 2014-06-03 (×3): 10 mg via ORAL
  Filled 2014-06-01 (×3): qty 1

## 2014-06-01 MED ORDER — INSULIN GLARGINE 100 UNIT/ML ~~LOC~~ SOLN
60.0000 [IU] | Freq: Every morning | SUBCUTANEOUS | Status: DC
  Administered 2014-06-01: 60 [IU] via SUBCUTANEOUS
  Filled 2014-06-01 (×2): qty 0.6

## 2014-06-01 MED ORDER — PNEUMOCOCCAL VAC POLYVALENT 25 MCG/0.5ML IJ INJ
0.5000 mL | INJECTION | INTRAMUSCULAR | Status: AC
  Administered 2014-06-02: 0.5 mL via INTRAMUSCULAR
  Filled 2014-06-01 (×2): qty 0.5

## 2014-06-01 MED ORDER — CARVEDILOL 3.125 MG PO TABS
3.1250 mg | ORAL_TABLET | Freq: Two times a day (BID) | ORAL | Status: DC
  Administered 2014-06-01 – 2014-06-03 (×5): 3.125 mg via ORAL
  Filled 2014-06-01 (×8): qty 1

## 2014-06-01 NOTE — Care Management Note (Addendum)
    Page 1 of 1   06/03/2014     1:53:25 PM CARE MANAGEMENT NOTE 06/03/2014  Patient:  Lorraine Jones, Lorraine Jones   Account Number:  [id number]  Date Initiated:  06/01/2014  Documentation initiated by:  Dessa Phi  Subjective/Objective Assessment:   63 Y/O F ADMITTED W/HYPONATREMIA,UTI,SEVERE CONSTIPATION.     Action/Plan:   FROM HOME W/SPOUSE.HAS CANE.Marland Kitchen   Anticipated DC Date:  06/03/2014   Anticipated DC Plan:  [place]  CM consult      Choice offered to / List presented to:             Status of service:  Completed, signed off Medicare Important Message given?  YES (If response is "NO", the following Medicare IM given date fields will be blank) Date Medicare IM given:  06/03/2014 Medicare IM given by:  [facility] Date Additional Medicare IM given:   Additional Medicare IM given by:    Discharge Disposition:  HOME/SELF CARE  Per UR Regulation:  Reviewed for med. necessity/level of care/duration of stay  If discussed at [place] of Stay Meetings, dates discussed:    Comments:  06/03/14 Denay Pleitez RN,BSN NCM [phone] D/C [place].  06/01/14 Niyana Chesbro RN,BSN NCM [phone] IVF,IV ABX.D/C [facility] HOME.

## 2014-06-01 NOTE — ED Provider Notes (Signed)
CSN: [id number]     Arrival date & time 05/31/14  1907 History   First MD Initiated Contact with Patient 05/31/14 2256     Chief Complaint  Patient presents with  . Abdominal Pain     (Consider location/radiation/quality/duration/timing/severity/associated sxs/prior Treatment) HPI  Lorraine Jones is a 63 y.o. female with past medical history of hypertension and diabetes, presented to the emergency department with abdominal pain for the past 2 weeks. Patient describes the pain as a hurts and cannot describe it further. It is in the right upper quadrant, and it is worse with eating. Because of this the patient has avoided eating and lost 9 pounds in the last 2 weeks. Her primary care provider gave her Prilosec and Zantac which did not provide any relief. Patient states has a history of a cholecystectomy. She denies any fevers or recent infections. There's been no vomiting no changes in her bowel movements no changes in her urine. Nothing has made the pain better thus far.  10 Systems reviewed and are negative for acute change except as noted in the HPI.    Past Medical History  Diagnosis Date  . Hypertension   . Diabetes mellitus   . Depression   . Chronic low back pain   . Arthritis   . Hyperlipidemia    Past Surgical History  Procedure Laterality Date  . Cholecystectomy  1996  . Lumbar disc surgery  1993    L4  --  L5  . Cystoscopy with ureteroscopy Right 09/01/2013    Procedure: CYSTOSCOPY WITH UNROOFING  RIGHT URETEROCELE AND URETERAL STONE REMOVED  WITH GYRUS COLLINS KNIFE. ;  Surgeon: Irine Seal, MD;  Location: [place] [facility];  Service: Urology;  Laterality: Right;  cysto, right uretersoscopy and stone extraction   collins knife   History reviewed. No pertinent family history. History  Substance Use Topics  . Smoking status: Current Every Day Smoker -- 1.00 packs/day for 12 years    Types: Cigarettes  . Smokeless tobacco: Never Used  . Alcohol Use: No   OB  History   Grav Para Term Preterm Abortions TAB SAB Ect Mult Living                 Review of Systems    Allergies  Diclofenac sodium; Lodine; Naproxen sodium; and Neurontin  Home Medications   Prior to Admission medications   Medication Sig Start Date End Date Taking? Authorizing Provider  aspirin EC 81 MG tablet Take 81 mg by mouth daily.   Yes Historical Provider, MD  carvedilol (COREG) 3.125 MG tablet Take 3.125 mg by mouth 2 (two) times daily with a meal.   Yes Historical Provider, MD  dorzolamide-timolol (COSOPT) 22.3-6.8 MG/ML ophthalmic solution Place 1 drop into both eyes 2 (two) times daily.   Yes Historical Provider, MD  fentaNYL (DURAGESIC - DOSED MCG/HR) 100 MCG/HR Place 100 mcg onto the skin every other day. Used with 27mcg patch.   Yes Historical Provider, MD  fentaNYL (DURAGESIC - DOSED MCG/HR) 75 MCG/HR Place 1 patch onto the skin every other day. Used with 156mcg patch   Yes Historical Provider, MD  Insulin Glargine (LANTUS SOLOSTAR) 100 UNIT/ML SOPN Inject 60 Units into the skin every morning.   Yes Historical Provider, MD  metFORMIN (GLUCOPHAGE) 1000 MG tablet Take 1,000 mg by mouth 2 (two) times daily with a meal.   Yes Historical Provider, MD  Multiple Vitamins-Minerals (MULTIVITAMIN WITH MINERALS) tablet Take 1 tablet by mouth daily.  Yes Historical Provider, MD  Olmesartan-Amlodipine-HCTZ (TRIBENZOR) 40-10-25 MG TABS Take 1 tablet by mouth daily.   Yes Historical Provider, MD  Oxycodone HCl 10 MG TABS Take 10-20 mg by mouth every 4 (four) hours as needed (breakthrough pain).   Yes Historical Provider, MD  pravastatin (PRAVACHOL) 40 MG tablet Take 40 mg by mouth daily.   Yes Historical Provider, MD  saxagliptin HCl (ONGLYZA) 5 MG TABS tablet Take 5 mg by mouth daily.   Yes Historical Provider, MD   BP 140/85  Pulse 98  Temp(Src) 98.1 F (36.7 C) (Oral)  Resp 18  Ht 4\' 11"  (1.499 m)  Wt 182 lb 12.8 oz (82.918 kg)  BMI 36.90 kg/m2  SpO2 98% Physical Exam   Nursing note and vitals reviewed. Constitutional: She is oriented to person, place, and time. She appears well-developed and well-nourished. She appears distressed.  Patient is in mild distress from abdominal pain.  HENT:  Head: Normocephalic and atraumatic.  Nose: Nose normal.  Mouth/Throat: Oropharynx is clear and moist. No oropharyngeal exudate.  Eyes: Conjunctivae and EOM are normal. Pupils are equal, round, and reactive to light. No scleral icterus.  Neck: Normal range of motion. Neck supple. No JVD present. No tracheal deviation present. No thyromegaly present.  Cardiovascular: Normal rate, regular rhythm and normal heart sounds.  Exam reveals no gallop and no friction rub.   No murmur heard. Pulmonary/Chest: Effort normal and breath sounds normal. No respiratory distress. She has no wheezes. She exhibits no tenderness.  Abdominal: Soft. Bowel sounds are normal. She exhibits no distension and no mass. There is tenderness. There is no rebound and no guarding.  Tenderness to palpation of the right upper quadrant and midepigastrium. Positive Murphy's sign noted  Musculoskeletal: Normal range of motion. She exhibits no edema and no tenderness.  Lymphadenopathy:    She has no cervical adenopathy.  Neurological: She is alert and oriented to person, place, and time. No cranial nerve deficit.  Skin: Skin is warm and dry. No rash noted. She is not diaphoretic. No erythema. No pallor.    ED Course  Procedures (including critical care time) Labs Review Labs Reviewed  CBC WITH DIFFERENTIAL - Abnormal; Notable for the following:    Hemoglobin 15.3 (*)    Platelets 428 (*)    All other components within normal limits  COMPREHENSIVE METABOLIC PANEL - Abnormal; Notable for the following:    Sodium 131 (*)    Chloride 88 (*)    Glucose, Bld 391 (*)    GFR calc non Af Amer 69 (*)    GFR calc Af Amer 80 (*)    Anion gap 17 (*)    All other components within normal limits  URINALYSIS,  ROUTINE W REFLEX MICROSCOPIC - Abnormal; Notable for the following:    APPearance CLOUDY (*)    Specific Gravity, Urine 1.036 (*)    Glucose, UA >1000 (*)    Hgb urine dipstick SMALL (*)    Leukocytes, UA MODERATE (*)    All other components within normal limits  URINE MICROSCOPIC-ADD ON - Abnormal; Notable for the following:    Squamous Epithelial / LPF FEW (*)    Bacteria, UA MANY (*)    All other components within normal limits  BASIC METABOLIC PANEL - Abnormal; Notable for the following:    Sodium 129 (*)    Chloride 88 (*)    Glucose, Bld 364 (*)    GFR calc non Af Amer 87 (*)    Anion gap  16 (*)    All other components within normal limits  GLUCOSE, CAPILLARY - Abnormal; Notable for the following:    Glucose-Capillary 321 (*)    All other components within normal limits  LIPASE, BLOOD  CBC  CREATININE, SERUM  TSH  HEMOGLOBIN A1C  I-STAT TROPOININ, ED    Imaging Review Ct Abdomen Pelvis W Contrast  06/01/2014   CLINICAL DATA:  RUQ pain, s/p chole RUQ pain, s/p chole  EXAM: CT ABDOMEN AND PELVIS WITH CONTRAST  TECHNIQUE: Multidetector CT imaging of the abdomen and pelvis was performed using the standard protocol following bolus administration of intravenous contrast.  CONTRAST:  186mL OMNIPAQUE IOHEXOL 300 MG/ML  SOLN  COMPARISON:  08/30/2013  FINDINGS: Visualized lung bases clear. Surgical clips in the gallbladder fossa. Unremarkable liver, spleen, adrenal glands, pancreas. Subcentimeter low-attenuation bilateral renal lesions probably cysts but too small to accurately characterize. Prominent extrarenal collecting systems, with no ureterectasis. Patchy aortoiliac arterial calcifications without aneurysm. Portal vein patent. Stomach, small bowel, and colon are nondilated. Normal appendix. Uterus and adnexal regions unremarkable. Urinary bladder incompletely distended. Left pelvic phlebolith. No ascites. No free air. Degenerative disc disease L4-S1. Advanced degenerative changes in  bilateral hips right worse than left.  IMPRESSION: 1. No acute abdominal process post cholecystectomy. 2. Additional degenerative and  ancillary findings as above.   Electronically Signed   By: Arne Cleveland M.D.   On: 06/01/2014 01:06     EKG Interpretation None      MDM   Final diagnoses:  Hyponatremia  UTI (lower urinary tract infection)  Right upper quadrant pain    Patient presents emerge primarily concerned abdominal pain for 2 weeks. Physical exam revealed a concerning amount of abdominal tenderness, a CT scan was ordered and the absence of the gallbladder. CT did not reveal any acute process for her pain. Urinalysis did show a UTI, but this does not explain her right upper quadrant pain. She was given ceftriaxone for treatment. In addition patient has elected to light abnormalities, likely secondary to malnutrition. She was given a liter bolus of normal saline for hyponatremia, however repeat labs remain the same. For these reasons she was retained in the hospital for correction of her electrolytes her lites and further evaluation of her abdominal pain.    Everlene Balls, MD 06/01/14 [phone]

## 2014-06-01 NOTE — ED Notes (Signed)
Pt's family contact information.  Emmilyn Crooke (Husband) [phone]  Laquientta Woodhead (Daughter) [phone]

## 2014-06-01 NOTE — H&P (Signed)
[facility] History and Physical  BOWIE DOIRON IRW:[id number] DOB: [birth year] DOA: 05/31/2014  Referring physician: Everlene Balls, MD PCP: Christianne Borrow, MD   Chief Complaint: Abdominal Pain  HPI: Lorraine Jones is a 63 y.o. female presents with constipation and nausea. She has been ill for about 2 weeks. Patient states that she has not had a bowel movement in about 2 weeks. She has had abdominal pain. She states that she has noted some nausea and vomiting also. She has not had any black stools or frank blood. Patient states that she has not had any blood in the vomitus. Patient states that she has had no fevers or chills noted. There may have been some difficulty with urination with increased frequency. In the ED lab work reveals hyponatremia and she is being admitted for this. In addition she has significant use of narcotics for chronic pain.   Review of Systems:  Complete ROS performed is unremarkable other than what is noted in HPI  Past Medical History  Diagnosis Date  . Hypertension   . Diabetes mellitus   . Depression   . Chronic low back pain   . Arthritis   . Hyperlipidemia    Past Surgical History  Procedure Laterality Date  . Cholecystectomy  1996  . Lumbar disc surgery  1993    L4  --  L5  . Cystoscopy with ureteroscopy Right 09/01/2013    Procedure: CYSTOSCOPY WITH UNROOFING  RIGHT URETEROCELE AND URETERAL STONE REMOVED  WITH GYRUS COLLINS KNIFE. ;  Surgeon: Irine Seal, MD;  Location: [facility];  Service: Urology;  Laterality: Right;  cysto, right uretersoscopy and stone extraction   collins knife   Social History:  reports that she has been smoking Cigarettes.  She has a 12 pack-year smoking history. She has never used smokeless tobacco. She reports that she does not drink alcohol or use illicit drugs.  Allergies  Allergen Reactions  . Diclofenac Sodium Swelling  . Lodine [Etodolac] Swelling  . Naproxen Sodium Swelling  . Neurontin  [Gabapentin] Swelling    No family history on file.   Prior to Admission medications   Medication Sig Start Date End Date Taking? Authorizing Provider  aspirin EC 81 MG tablet Take 81 mg by mouth daily.   Yes Historical Provider, MD  carvedilol (COREG) 3.125 MG tablet Take 3.125 mg by mouth 2 (two) times daily with a meal.   Yes Historical Provider, MD  dorzolamide-timolol (COSOPT) 22.3-6.8 MG/ML ophthalmic solution Place 1 drop into both eyes 2 (two) times daily.   Yes Historical Provider, MD  fentaNYL (DURAGESIC - DOSED MCG/HR) 100 MCG/HR Place 100 mcg onto the skin every other day. Used with 26mcg patch.   Yes Historical Provider, MD  fentaNYL (DURAGESIC - DOSED MCG/HR) 75 MCG/HR Place 1 patch onto the skin every other day. Used with 169mcg patch   Yes Historical Provider, MD  Insulin Glargine (LANTUS SOLOSTAR) 100 UNIT/ML SOPN Inject 60 Units into the skin every morning.   Yes Historical Provider, MD  metFORMIN (GLUCOPHAGE) 1000 MG tablet Take 1,000 mg by mouth 2 (two) times daily with a meal.   Yes Historical Provider, MD  Multiple Vitamins-Minerals (MULTIVITAMIN WITH MINERALS) tablet Take 1 tablet by mouth daily.   Yes Historical Provider, MD  Olmesartan-Amlodipine-HCTZ (TRIBENZOR) 40-10-25 MG TABS Take 1 tablet by mouth daily.   Yes Historical Provider, MD  Oxycodone HCl 10 MG TABS Take 10-20 mg by mouth every 4 (four) hours as needed (breakthrough pain).  Yes Historical Provider, MD  pravastatin (PRAVACHOL) 40 MG tablet Take 40 mg by mouth daily.   Yes Historical Provider, MD  saxagliptin HCl (ONGLYZA) 5 MG TABS tablet Take 5 mg by mouth daily.   Yes Historical Provider, MD   Physical Exam: Filed Vitals:   05/31/14 1936 05/31/14 2315 06/01/14 0130  BP: 167/115 160/84 148/79  Pulse: 120 112 101  Temp: 98.9 F (37.2 C) 98.1 F (36.7 C) 97.9 F (36.6 C)  TempSrc: Oral Oral Oral  Resp: 18 20 17   SpO2: 99% 97% 95%    Wt Readings from Last 3 Encounters:  09/01/13 87.998 kg (194  lb)  09/01/13 87.998 kg (194 lb)    General:  Appears calm and comfortable Eyes: PERRL, normal lids, irises & conjunctiva ENT: grossly normal hearing, lips & tongue Neck: no LAD, masses or thyromegaly Cardiovascular: RRR, no m/r/g. No LE edema. Respiratory: CTA bilaterally, no w/r/r. Normal respiratory effort. Abdomen: soft, non distended Bowel sounds are diminished Skin: no rash or induration seen on limited exam Musculoskeletal: grossly normal tone BUE/BLE Psychiatric: grossly normal mood and affect, speech fluent and appropriate Neurologic: grossly non-focal.          Labs on Admission:  Basic Metabolic Panel:  Recent Labs Lab 05/31/14 2042 06/01/14 0232  NA 131* 129*  K 4.2 4.1  CL 88* 88*  CO2 26 25  GLUCOSE 391* 364*  BUN 12 12  CREATININE 0.87 0.79  CALCIUM 10.4 9.4   Liver Function Tests:  Recent Labs Lab 05/31/14 2042  AST 35  ALT 22  ALKPHOS 94  BILITOT 0.3  PROT 8.3  ALBUMIN 4.0    Recent Labs Lab 05/31/14 2042  LIPASE 47   No results found for this basename: AMMONIA,  in the last 168 hours CBC:  Recent Labs Lab 05/31/14 2042  WBC 9.5  NEUTROABS 6.1  HGB 15.3*  HCT 43.5  MCV 85.8  PLT 428*   Cardiac Enzymes: No results found for this basename: CKTOTAL, CKMB, CKMBINDEX, TROPONINI,  in the last 168 hours  BNP (last 3 results) No results found for this basename: PROBNP,  in the last 8760 hours CBG: No results found for this basename: GLUCAP,  in the last 168 hours  Radiological Exams on Admission: Ct Abdomen Pelvis W Contrast  06/01/2014   CLINICAL DATA:  RUQ pain, s/p chole RUQ pain, s/p chole  EXAM: CT ABDOMEN AND PELVIS WITH CONTRAST  TECHNIQUE: Multidetector CT imaging of the abdomen and pelvis was performed using the standard protocol following bolus administration of intravenous contrast.  CONTRAST:  150mL OMNIPAQUE IOHEXOL 300 MG/ML  SOLN  COMPARISON:  08/30/2013  FINDINGS: Visualized lung bases clear. Surgical clips in the  gallbladder fossa. Unremarkable liver, spleen, adrenal glands, pancreas. Subcentimeter low-attenuation bilateral renal lesions probably cysts but too small to accurately characterize. Prominent extrarenal collecting systems, with no ureterectasis. Patchy aortoiliac arterial calcifications without aneurysm. Portal vein patent. Stomach, small bowel, and colon are nondilated. Normal appendix. Uterus and adnexal regions unremarkable. Urinary bladder incompletely distended. Left pelvic phlebolith. No ascites. No free air. Degenerative disc disease L4-S1. Advanced degenerative changes in bilateral hips right worse than left.  IMPRESSION: 1. No acute abdominal process post cholecystectomy. 2. Additional degenerative and  ancillary findings as above.   Electronically Signed   By: Arne Cleveland M.D.   On: 06/01/2014 01:06      Assessment/Plan Active Problems:   Hyponatremia   Hypertension   Abdominal pain   Diabetes mellitus   1.  Hyponatremia -will be started on IV NS  -repeat labs in am  2. Hypertension -currently pressure is under control -will continue with home medications  3. UTI -started on rocephin daily -await culture results  4. Abdominal pain/constipation -she is high doses of narcotics likely contributing to her constipation -will hold fentanyl patch -give morphine as needed -CT scan clear of any obstruction -pain is more chronic nature  5. Diabetes Mellitus -will be continued on insulin -monitor CBG -cover with finger sticks   Code Status: Full Code (must indicate code status--if unknown or must be presumed, indicate so) DVT Prophylaxis:Heparin Family Communication: NOne (indicate person spoken with, if applicable, with phone number if by telephone) Disposition Plan: Home (indicate anticipated LOS)  Time spent: 73min  Nixon Sparr A [facility] Pager [phone]  **Disclaimer: This note may have been dictated with voice recognition software. Similar sounding  words can inadvertently be transcribed and this note may contain transcription errors which may not have been corrected upon publication of note.**

## 2014-06-01 NOTE — Progress Notes (Signed)
Patient seen and examined earlier today by my colleague Dr. Humphrey Rolls. Patient seen and examined, data base reviewed. Presented with moderate to severe epigastric abdominal pain. Found to have UTI, and severe constipation. Pseudohyponatremia with sodium of 129, corrected to glucose of 364 is 135.  Birdie Hopes Pager: [phone] 06/01/2014, 1:58 PM

## 2014-06-01 NOTE — Progress Notes (Signed)
UR completed 

## 2014-06-01 NOTE — ED Notes (Signed)
Bedside report received from previous RN, Carmelia.

## 2014-06-01 NOTE — Progress Notes (Signed)
Call to Dr. Humphrey Rolls to inform him patient CBG on arrival to floor is 321. Informed him this was taken due to lab glucose 364 on last check. Verbal order from Dr. Humphrey Rolls to cover patient CBG per existing insulin protocol as addtional dose. Alesia Richards, RN, 06/01/2014, .[phone].

## 2014-06-02 DIAGNOSIS — E871 Hypo-osmolality and hyponatremia: Principal | ICD-10-CM

## 2014-06-02 DIAGNOSIS — K5909 Other constipation: Secondary | ICD-10-CM | POA: Diagnosis present

## 2014-06-02 DIAGNOSIS — R1011 Right upper quadrant pain: Secondary | ICD-10-CM

## 2014-06-02 DIAGNOSIS — R1084 Generalized abdominal pain: Secondary | ICD-10-CM

## 2014-06-02 DIAGNOSIS — E119 Type 2 diabetes mellitus without complications: Secondary | ICD-10-CM

## 2014-06-02 DIAGNOSIS — N390 Urinary tract infection, site not specified: Secondary | ICD-10-CM

## 2014-06-02 LAB — GLUCOSE, CAPILLARY
GLUCOSE-CAPILLARY: 260 mg/dL — AB (ref 70–99)
GLUCOSE-CAPILLARY: 275 mg/dL — AB (ref 70–99)
GLUCOSE-CAPILLARY: 254 mg/dL — AB (ref 70–99)
GLUCOSE-CAPILLARY: 142 mg/dL — AB (ref 70–99)
Glucose-Capillary: 140 mg/dL — ABNORMAL HIGH (ref 70–99)

## 2014-06-02 LAB — BASIC METABOLIC PANEL
Anion gap: 14 (ref 5–15)
BUN: 6 mg/dL (ref 6–23)
CHLORIDE: 98 meq/L (ref 96–112)
CO2: 25 mEq/L (ref 19–32)
Calcium: 9.4 mg/dL (ref 8.4–10.5)
Creatinine, Ser: 0.73 mg/dL (ref 0.50–1.10)
GFR calc non Af Amer: 89 mL/min — ABNORMAL LOW (ref >90)
Glucose, Bld: 245 mg/dL — ABNORMAL HIGH (ref 70–99)
Potassium: 3.8 mEq/L (ref 3.7–5.3)
SODIUM: 137 meq/L (ref 137–147)

## 2014-06-02 MED ORDER — INSULIN GLARGINE 100 UNIT/ML ~~LOC~~ SOLN
80.0000 [IU] | Freq: Every morning | SUBCUTANEOUS | Status: DC
  Administered 2014-06-02 – 2014-06-03 (×2): 80 [IU] via SUBCUTANEOUS
  Filled 2014-06-02 (×3): qty 0.8

## 2014-06-02 NOTE — Progress Notes (Signed)
Patient had a large bowel movement this morning, and requested to not have Miralax this morning.  Durwin Nora RN

## 2014-06-02 NOTE — Progress Notes (Addendum)
Inpatient Diabetes Program Recommendations  AACE/ADA: New Consensus Statement on Inpatient Glycemic Control (2013)  Target Ranges:  Prepandial:   less than 140 mg/dL      Peak postprandial:   less than 180 mg/dL (1-2 hours)      Critically ill patients:  140 - 180 mg/dL     Results for Lorraine Jones, Lorraine Jones (MRN [id number]) as of 06/02/2014 14:24  Ref. Range 06/02/2014 07:53 06/02/2014 12:01  Glucose-Capillary Latest Range: 70-99 mg/dL 275 (H) 254 (H)    Results for Lorraine Jones, Lorraine Jones (MRN [id number]) as of 06/02/2014 14:24  Ref. Range 06/01/2014 06:23  Hemoglobin A1C Latest Range: <5.7 % 9.3 (H)     Home DM Meds:   Lantus 60 units QAM  Onglyza 5 mg daily  Metformin 1000 mg bid   **Note Lantus increased to 80 units QAM today.  Patient also getting Home oral DM medications + Novolog Moderate SSI.  **Spoke to patient about her current A1c of 9.3%.  Explained what an A1c is and what it measures.  Reminded patient that her goal A1c is 7% or less per ADA standards to prevent both acute and long-term complications.  Encouraged patient to check her CBGs at least bid at home (fasting and another check within the day) and to record all CBGs in a logbook for her PCP to review.  **Patient told me she sees her MD (Dr. Ancil Boozer at [facility] in [place]) every 3 months.  Patient told me her last A1c back in July was 7.7%.  Patient stated she constantly battles with her blood sugars and tries very hard to manage them at home.  Discussed with patient that the MD has increased her Lantus dose today.  Patient asked me if she will need to take more Lantus at home.  Explained to patient that the MD will ultimately make that decision and that the nurses working with her will review any medication adjustments with her at time of d/c.  Patient very appreciative of my visit and told me she did not have any additional questions.     Will follow Wyn Quaker RN, MSN, CDE Diabetes Coordinator Inpatient Diabetes  Program Team Pager: [phone] (8a-10p)

## 2014-06-02 NOTE — Progress Notes (Signed)
[facility] PROGRESS NOTE   Lorraine Jones ATF:[id number] DOB: July 15, 1951 DOA: 05/31/2014 PCP: Christianne Borrow, MD  HPI/Subjective: Feels much better, seen with husband at bedside. Epigastric abdominal pain is much better, had bowel movement this morning.  Assessment/Plan: Active Problems:   Hyponatremia   Hypertension   Abdominal pain   Diabetes mellitus     Abdominal pain/constipation -She is high doses of narcotics likely contributing to her constipation  -will hold fentanyl patch  -give morphine as needed  -CT scan clear of any obstruction  -Pain is much better, likely vomiting is contributing to abdominal pain. -Continue bowel regimen.  Hypertension -currently pressure is under control  -will continue with home medications   UTI -started on rocephin daily  -await culture results   Hyponatremia -will be started on IV NS  -repeat labs in am  Diabetes Mellitus -Uncontrolled diabetes mellitus, hemoglobin A1c is 9.2. -Patient is on 60 units of Lantus, we'll increase it to 80 units. -Continue SSI and carb modified diet.  Code Status: Full code Family Communication: Plan discussed with the patient. Disposition Plan: Remains inpatient   Consultants:  None  Procedures:  1  Antibiotics: Rocephin  Objective: Filed Vitals:   06/02/14 1355  BP: 109/66  Pulse: 92  Temp: 97.8 F (36.6 C)  Resp: 18    Intake/Output Summary (Last 24 hours) at 06/02/14 1437 Last data filed at 06/02/14 1330  Gross per 24 hour  Intake   2495 ml  Output   3401 ml  Net   -906 ml   Filed Weights   06/01/14 0457  Weight: 82.918 kg (182 lb 12.8 oz)    Exam: General: Alert and awake, oriented x3, not in any acute distress. HEENT: anicteric sclera, pupils reactive to light and accommodation, EOMI CVS: S1-S2 clear, no murmur rubs or gallops Chest: clear to auscultation bilaterally, no wheezing, rales or rhonchi Abdomen: soft nontender, nondistended, normal bowel  sounds, no organomegaly Extremities: no cyanosis, clubbing or edema noted bilaterally Neuro: Cranial nerves II-XII intact, no focal neurological deficits  Data Reviewed: Basic Metabolic Panel:  Recent Labs Lab 05/31/14 2042 06/01/14 0232 06/01/14 0623 06/02/14 0418  NA 131* 129*  --  137  K 4.2 4.1  --  3.8  CL 88* 88*  --  98  CO2 26 25  --  25  GLUCOSE 391* 364*  --  245*  BUN 12 12  --  6  CREATININE 0.87 0.79 0.77 0.73  CALCIUM 10.4 9.4  --  9.4   Liver Function Tests:  Recent Labs Lab 05/31/14 2042  AST 35  ALT 22  ALKPHOS 94  BILITOT 0.3  PROT 8.3  ALBUMIN 4.0    Recent Labs Lab 05/31/14 2042  LIPASE 47   No results found for this basename: AMMONIA,  in the last 168 hours CBC:  Recent Labs Lab 05/31/14 2042 06/01/14 0623  WBC 9.5 8.4  NEUTROABS 6.1  --   HGB 15.3* 13.6  HCT 43.5 39.6  MCV 85.8 85.9  PLT 428* 370   Cardiac Enzymes: No results found for this basename: CKTOTAL, CKMB, CKMBINDEX, TROPONINI,  in the last 168 hours BNP (last 3 results) No results found for this basename: PROBNP,  in the last 8760 hours CBG:  Recent Labs Lab 06/01/14 1150 06/01/14 1700 06/01/14 2045 06/02/14 0753 06/02/14 1201  GLUCAP 274* 220* 260* 275* 254*    Micro No results found for this or any previous visit (from the past 240 hour(s)).   Studies:  Ct Abdomen Pelvis W Contrast  06/01/2014   CLINICAL DATA:  RUQ pain, s/p chole RUQ pain, s/p chole  EXAM: CT ABDOMEN AND PELVIS WITH CONTRAST  TECHNIQUE: Multidetector CT imaging of the abdomen and pelvis was performed using the standard protocol following bolus administration of intravenous contrast.  CONTRAST:  130mL OMNIPAQUE IOHEXOL 300 MG/ML  SOLN  COMPARISON:  08/30/2013  FINDINGS: Visualized lung bases clear. Surgical clips in the gallbladder fossa. Unremarkable liver, spleen, adrenal glands, pancreas. Subcentimeter low-attenuation bilateral renal lesions probably cysts but too small to accurately  characterize. Prominent extrarenal collecting systems, with no ureterectasis. Patchy aortoiliac arterial calcifications without aneurysm. Portal vein patent. Stomach, small bowel, and colon are nondilated. Normal appendix. Uterus and adnexal regions unremarkable. Urinary bladder incompletely distended. Left pelvic phlebolith. No ascites. No free air. Degenerative disc disease L4-S1. Advanced degenerative changes in bilateral hips right worse than left.  IMPRESSION: 1. No acute abdominal process post cholecystectomy. 2. Additional degenerative and  ancillary findings as above.   Electronically Signed   By: Arne Cleveland M.D.   On: 06/01/2014 01:06    Scheduled Meds: . irbesartan  300 mg Oral Daily   And  . amLODipine  10 mg Oral Daily   And  . hydrochlorothiazide  25 mg Oral Daily  . aspirin EC  81 mg Oral Daily  . carvedilol  3.125 mg Oral BID WC  . cefTRIAXone (ROCEPHIN)  IV  1 g Intravenous Q24H  . docusate sodium  100 mg Oral BID  . dorzolamide-timolol  1 drop Both Eyes BID  . folic acid  1 mg Oral Daily  . heparin  5,000 Units Subcutaneous 3 times per day  . insulin aspart  0-15 Units Subcutaneous TID WC  . insulin glargine  80 Units Subcutaneous q morning - 10a  . linagliptin  5 mg Oral Daily  . metFORMIN  1,000 mg Oral BID WC  . multivitamin with minerals  1 tablet Oral Daily  . polyethylene glycol  17 g Oral Daily  . pravastatin  40 mg Oral q1800  . thiamine  100 mg Oral Daily   Continuous Infusions: . sodium chloride 75 mL/hr at 06/02/14 0431       Time spent: 35 minutes    [facility] A  [facility] Pager [phone] If 7PM-7AM, please contact night-coverage at [email or web address], password [facility] 06/02/2014, 2:37 PM  LOS: 2 days

## 2014-06-03 DIAGNOSIS — I10 Essential (primary) hypertension: Secondary | ICD-10-CM

## 2014-06-03 DIAGNOSIS — R1013 Epigastric pain: Secondary | ICD-10-CM

## 2014-06-03 LAB — BASIC METABOLIC PANEL
ANION GAP: 14 (ref 5–15)
BUN: 4 mg/dL — ABNORMAL LOW (ref 6–23)
CHLORIDE: 97 meq/L (ref 96–112)
CO2: 24 meq/L (ref 19–32)
Calcium: 9.9 mg/dL (ref 8.4–10.5)
Creatinine, Ser: 0.83 mg/dL (ref 0.50–1.10)
GFR calc Af Amer: 85 mL/min — ABNORMAL LOW (ref >90)
GFR calc non Af Amer: 73 mL/min — ABNORMAL LOW (ref >90)
GLUCOSE: 233 mg/dL — AB (ref 70–99)
POTASSIUM: 4 meq/L (ref 3.7–5.3)
SODIUM: 135 meq/L — AB (ref 137–147)

## 2014-06-03 LAB — GLUCOSE, CAPILLARY
GLUCOSE-CAPILLARY: 209 mg/dL — AB (ref 70–99)
Glucose-Capillary: 128 mg/dL — ABNORMAL HIGH (ref 70–99)

## 2014-06-03 MED ORDER — INSULIN GLARGINE 100 UNIT/ML SOLOSTAR PEN: 80.0000 [IU] | PEN_INJECTOR | Freq: Every morning | SUBCUTANEOUS | Status: DC

## 2014-06-03 MED ORDER — CEFUROXIME AXETIL 500 MG PO TABS: 500.0000 mg | ORAL_TABLET | Freq: Two times a day (BID) | ORAL | Status: DC

## 2014-06-03 MED ORDER — POLYETHYLENE GLYCOL 3350 17 G PO PACK: 17.0000 g | PACK | Freq: Every day | ORAL | Status: AC

## 2014-06-03 NOTE — Discharge Summary (Signed)
Physician Discharge Summary  Lorraine Jones JJO:[id number] DOB: 1951/05/05 DOA: 05/31/2014  PCP: Christianne Borrow, MD  Admit date: 05/31/2014 Discharge date: 06/03/2014  Time spent: 40 minutes  Recommendations for Outpatient Follow-up:  1. Followup with primary care physician within one week.  Discharge Diagnoses:  Principal Problem:   Abdominal pain Active Problems:   Hyponatremia   Hypertension   Diabetes mellitus   Unspecified constipation   Discharge Condition: Stable  Diet recommendation: Heart healthy  Filed Weights   06/01/14 0457  Weight: 82.918 kg (182 lb 12.8 oz)    History of present illness:  Lorraine Jones is a 63 y.o. female presents with constipation and nausea. She has been ill for about 2 weeks. Patient states that she has not had a bowel movement in about 2 weeks. She has had abdominal pain. She states that she has noted some nausea and vomiting also. She has not had any black stools or frank blood. Patient states that she has not had any blood in the vomitus. Patient states that she has had no fevers or chills noted. There may have been some difficulty with urination with increased frequency. In the ED lab work reveals hyponatremia and she is being admitted for this. In addition she has significant use of narcotics for chronic pain.  Hospital Course:   Abdominal pain/constipation  -She is high doses of narcotics likely contributing to her constipation  -Fentanyl patch held at the time of admission and restarted on discharge. -CT scan did not show any abnormalities. -Pain resolved prior to discharge, likely vomiting is contributing to abdominal pain. -Patient tolerated diet very well prior to discharge, she had 4 bowel movements.  -I have asked her to take MiraLax every day and MOM as needed for constipation  UTI  -Urinalysis was consistent with UTI the time of admission, patient started on Rocephin. -At the time of discharge the urine culture was not back so  discharge on Ceftin for 5 more days.  Diabetes Mellitus  -Uncontrolled DM2, hemoglobin A1c is 9.3 this correlates with a mean blood glucose of 220.  -Patient is on 60 units of Lantus, increased to 80 units. -Explained to her at length about sticking to carbohydrate modified diet.  Hypertension  -currently pressure is under control  -will continue with home medications   Hyponatremia  -Hyponatremia was pseudo-hyponatremia secondary to hyperglycemia. -Corrected sodium level was 135 at the time of admission (presented was 129 and glucose of 364)   Procedures:  None  Consultations:  None  Discharge Exam: Filed Vitals:   06/03/14 0624  BP: 144/83  Pulse: 98  Temp: 98.2 F (36.8 C)  Resp: 17   General: Alert and awake, oriented x3, not in any acute distress. HEENT: anicteric sclera, pupils reactive to light and accommodation, EOMI CVS: S1-S2 clear, no murmur rubs or gallops Chest: clear to auscultation bilaterally, no wheezing, rales or rhonchi Abdomen: soft nontender, nondistended, normal bowel sounds, no organomegaly Extremities: no cyanosis, clubbing or edema noted bilaterally Neuro: Cranial nerves II-XII intact, no focal neurological deficits  Discharge Instructions You were cared for by a hospitalist during your hospital stay. If you have any questions about your discharge medications or the care you received while you were in the hospital after you are discharged, you can call the unit and asked to speak with the hospitalist on call if the hospitalist that took care of you is not available. Once you are discharged, your primary care physician will handle any further medical issues. Please  note that NO REFILLS for any discharge medications will be authorized once you are discharged, as it is imperative that you return to your primary care physician (or establish a relationship with a primary care physician if you do not have one) for your aftercare needs so that they can  reassess your need for medications and monitor your lab values.  Discharge Instructions   Diet Carb Modified    Complete by:  As directed      Increase activity slowly    Complete by:  As directed           Current Discharge Medication List    START taking these medications   Details  cefUROXime (CEFTIN) 500 MG tablet Take 1 tablet (500 mg total) by mouth 2 (two) times daily with a meal. Qty: 10 tablet, Refills: 0    polyethylene glycol (MIRALAX / GLYCOLAX) packet Take 17 g by mouth daily. Qty: 14 each, Refills: 0      CONTINUE these medications which have CHANGED   Details  Insulin Glargine (LANTUS SOLOSTAR) 100 UNIT/ML Solostar Pen Inject 80 Units into the skin every morning.      CONTINUE these medications which have NOT CHANGED   Details  aspirin EC 81 MG tablet Take 81 mg by mouth daily.    carvedilol (COREG) 3.125 MG tablet Take 3.125 mg by mouth 2 (two) times daily with a meal.    dorzolamide-timolol (COSOPT) 22.3-6.8 MG/ML ophthalmic solution Place 1 drop into both eyes 2 (two) times daily.    fentaNYL (DURAGESIC - DOSED MCG/HR) 100 MCG/HR Place 100 mcg onto the skin every other day. Used with 5mcg patch.    fentaNYL (DURAGESIC - DOSED MCG/HR) 75 MCG/HR Place 1 patch onto the skin every other day. Used with 127mcg patch    metFORMIN (GLUCOPHAGE) 1000 MG tablet Take 1,000 mg by mouth 2 (two) times daily with a meal.    Multiple Vitamins-Minerals (MULTIVITAMIN WITH MINERALS) tablet Take 1 tablet by mouth daily.    Olmesartan-Amlodipine-HCTZ (TRIBENZOR) 40-10-25 MG TABS Take 1 tablet by mouth daily.    Oxycodone HCl 10 MG TABS Take 10-20 mg by mouth every 4 (four) hours as needed (breakthrough pain).    pravastatin (PRAVACHOL) 40 MG tablet Take 40 mg by mouth daily.    saxagliptin HCl (ONGLYZA) 5 MG TABS tablet Take 5 mg by mouth daily.       Allergies  Allergen Reactions  . Diclofenac Sodium Swelling  . Lodine [Etodolac] Swelling  . Naproxen Sodium  Swelling  . Neurontin [Gabapentin] Swelling   Follow-up Information   Follow up with Christianne Borrow, MD In 1 week.   Specialty:  Family Medicine   Contact information:   [address] [place] [address] [phone]        The results of significant diagnostics from this hospitalization (including imaging, microbiology, ancillary and laboratory) are listed below for reference.    Significant Diagnostic Studies: Ct Abdomen Pelvis W Contrast  06/01/2014   CLINICAL DATA:  RUQ pain, s/p chole RUQ pain, s/p chole  EXAM: CT ABDOMEN AND PELVIS WITH CONTRAST  TECHNIQUE: Multidetector CT imaging of the abdomen and pelvis was performed using the standard protocol following bolus administration of intravenous contrast.  CONTRAST:  154mL OMNIPAQUE IOHEXOL 300 MG/ML  SOLN  COMPARISON:  08/30/2013  FINDINGS: Visualized lung bases clear. Surgical clips in the gallbladder fossa. Unremarkable liver, spleen, adrenal glands, pancreas. Subcentimeter low-attenuation bilateral renal lesions probably cysts but too small to accurately characterize.  Prominent extrarenal collecting systems, with no ureterectasis. Patchy aortoiliac arterial calcifications without aneurysm. Portal vein patent. Stomach, small bowel, and colon are nondilated. Normal appendix. Uterus and adnexal regions unremarkable. Urinary bladder incompletely distended. Left pelvic phlebolith. No ascites. No free air. Degenerative disc disease L4-S1. Advanced degenerative changes in bilateral hips right worse than left.  IMPRESSION: 1. No acute abdominal process post cholecystectomy. 2. Additional degenerative and  ancillary findings as above.   Electronically Signed   By: Arne Cleveland M.D.   On: 06/01/2014 01:06    Microbiology: No results found for this or any previous visit (from the past 240 hour(s)).   Labs: Basic Metabolic Panel:  Recent Labs Lab 05/31/14 2042 06/01/14 0232 06/01/14 0623 06/02/14 0418 06/03/14 0831   NA 131* 129*  --  137 135*  K 4.2 4.1  --  3.8 4.0  CL 88* 88*  --  98 97  CO2 26 25  --  25 24  GLUCOSE 391* 364*  --  245* 233*  BUN 12 12  --  6 4*  CREATININE 0.87 0.79 0.77 0.73 0.83  CALCIUM 10.4 9.4  --  9.4 9.9   Liver Function Tests:  Recent Labs Lab 05/31/14 2042  AST 35  ALT 22  ALKPHOS 94  BILITOT 0.3  PROT 8.3  ALBUMIN 4.0    Recent Labs Lab 05/31/14 2042  LIPASE 47   No results found for this basename: AMMONIA,  in the last 168 hours CBC:  Recent Labs Lab 05/31/14 2042 06/01/14 0623  WBC 9.5 8.4  NEUTROABS 6.1  --   HGB 15.3* 13.6  HCT 43.5 39.6  MCV 85.8 85.9  PLT 428* 370   Cardiac Enzymes: No results found for this basename: CKTOTAL, CKMB, CKMBINDEX, TROPONINI,  in the last 168 hours BNP: BNP (last 3 results) No results found for this basename: PROBNP,  in the last 8760 hours CBG:  Recent Labs Lab 06/02/14 0753 06/02/14 1201 06/02/14 1645 06/02/14 2131 06/03/14 0720  GLUCAP 275* 254* 142* 140* 128*       Signed:  Duaa Stelzner A  [facility] 06/03/2014, 11:35 AM

## 2014-06-09 ENCOUNTER — Ambulatory Visit: Payer: Self-pay | Admitting: Pain Medicine

## 2014-07-07 ENCOUNTER — Ambulatory Visit: Payer: Self-pay | Admitting: Pain Medicine

## 2014-07-27 ENCOUNTER — Ambulatory Visit: Payer: Self-pay | Admitting: Pain Medicine

## 2014-08-30 ENCOUNTER — Ambulatory Visit: Payer: Self-pay | Admitting: Pain Medicine

## 2014-09-26 ENCOUNTER — Ambulatory Visit: Payer: Self-pay | Admitting: Pain Medicine

## 2014-10-04 ENCOUNTER — Ambulatory Visit: Payer: Self-pay | Admitting: Pain Medicine

## 2014-10-04 DIAGNOSIS — G894 Chronic pain syndrome: Secondary | ICD-10-CM | POA: Diagnosis not present

## 2014-10-04 DIAGNOSIS — M5416 Radiculopathy, lumbar region: Secondary | ICD-10-CM | POA: Diagnosis not present

## 2014-10-04 DIAGNOSIS — F1120 Opioid dependence, uncomplicated: Secondary | ICD-10-CM | POA: Diagnosis not present

## 2014-10-04 DIAGNOSIS — M169 Osteoarthritis of hip, unspecified: Secondary | ICD-10-CM | POA: Diagnosis not present

## 2014-10-04 DIAGNOSIS — E1340 Other specified diabetes mellitus with diabetic neuropathy, unspecified: Secondary | ICD-10-CM | POA: Diagnosis not present

## 2014-10-04 DIAGNOSIS — M47896 Other spondylosis, lumbar region: Secondary | ICD-10-CM | POA: Diagnosis not present

## 2014-10-04 DIAGNOSIS — Z79891 Long term (current) use of opiate analgesic: Secondary | ICD-10-CM | POA: Diagnosis not present

## 2014-10-04 DIAGNOSIS — M47817 Spondylosis without myelopathy or radiculopathy, lumbosacral region: Secondary | ICD-10-CM | POA: Diagnosis not present

## 2014-10-04 DIAGNOSIS — Z79899 Other long term (current) drug therapy: Secondary | ICD-10-CM | POA: Diagnosis not present

## 2014-10-13 DIAGNOSIS — G8929 Other chronic pain: Secondary | ICD-10-CM | POA: Diagnosis not present

## 2014-10-13 DIAGNOSIS — I10 Essential (primary) hypertension: Secondary | ICD-10-CM | POA: Diagnosis not present

## 2014-10-13 DIAGNOSIS — M549 Dorsalgia, unspecified: Secondary | ICD-10-CM | POA: Diagnosis not present

## 2014-10-13 DIAGNOSIS — E1121 Type 2 diabetes mellitus with diabetic nephropathy: Secondary | ICD-10-CM | POA: Diagnosis not present

## 2014-10-13 DIAGNOSIS — N182 Chronic kidney disease, stage 2 (mild): Secondary | ICD-10-CM | POA: Diagnosis not present

## 2014-12-01 ENCOUNTER — Ambulatory Visit: Payer: Self-pay | Admitting: Pain Medicine

## 2014-12-01 DIAGNOSIS — M47817 Spondylosis without myelopathy or radiculopathy, lumbosacral region: Secondary | ICD-10-CM | POA: Diagnosis not present

## 2014-12-01 DIAGNOSIS — M169 Osteoarthritis of hip, unspecified: Secondary | ICD-10-CM | POA: Diagnosis not present

## 2014-12-01 DIAGNOSIS — E1340 Other specified diabetes mellitus with diabetic neuropathy, unspecified: Secondary | ICD-10-CM | POA: Diagnosis not present

## 2014-12-01 DIAGNOSIS — M1610 Unilateral primary osteoarthritis, unspecified hip: Secondary | ICD-10-CM | POA: Diagnosis not present

## 2014-12-01 DIAGNOSIS — M47896 Other spondylosis, lumbar region: Secondary | ICD-10-CM | POA: Diagnosis not present

## 2014-12-01 DIAGNOSIS — M5416 Radiculopathy, lumbar region: Secondary | ICD-10-CM | POA: Diagnosis not present

## 2014-12-26 ENCOUNTER — Ambulatory Visit: Payer: Self-pay | Admitting: Pain Medicine

## 2014-12-26 DIAGNOSIS — M169 Osteoarthritis of hip, unspecified: Secondary | ICD-10-CM | POA: Diagnosis not present

## 2014-12-26 DIAGNOSIS — M47896 Other spondylosis, lumbar region: Secondary | ICD-10-CM | POA: Diagnosis not present

## 2014-12-26 DIAGNOSIS — M1610 Unilateral primary osteoarthritis, unspecified hip: Secondary | ICD-10-CM | POA: Diagnosis not present

## 2014-12-26 DIAGNOSIS — E119 Type 2 diabetes mellitus without complications: Secondary | ICD-10-CM | POA: Diagnosis not present

## 2015-01-26 ENCOUNTER — Ambulatory Visit
Admit: 2015-01-26 | Disposition: A | Discharge status: Home / Self Care | Payer: Self-pay | Attending: Pain Medicine | Admitting: Pain Medicine

## 2015-01-26 DIAGNOSIS — I10 Essential (primary) hypertension: Secondary | ICD-10-CM | POA: Diagnosis not present

## 2015-01-26 DIAGNOSIS — E1340 Other specified diabetes mellitus with diabetic neuropathy, unspecified: Secondary | ICD-10-CM | POA: Diagnosis not present

## 2015-01-26 DIAGNOSIS — M169 Osteoarthritis of hip, unspecified: Secondary | ICD-10-CM | POA: Diagnosis not present

## 2015-01-26 DIAGNOSIS — M5416 Radiculopathy, lumbar region: Secondary | ICD-10-CM | POA: Diagnosis not present

## 2015-01-26 DIAGNOSIS — E780 Pure hypercholesterolemia: Secondary | ICD-10-CM | POA: Diagnosis not present

## 2015-01-26 DIAGNOSIS — M545 Low back pain: Secondary | ICD-10-CM | POA: Diagnosis not present

## 2015-01-26 DIAGNOSIS — Z5181 Encounter for therapeutic drug level monitoring: Secondary | ICD-10-CM | POA: Diagnosis not present

## 2015-01-26 DIAGNOSIS — Z79899 Other long term (current) drug therapy: Secondary | ICD-10-CM | POA: Diagnosis not present

## 2015-01-26 DIAGNOSIS — M47817 Spondylosis without myelopathy or radiculopathy, lumbosacral region: Secondary | ICD-10-CM | POA: Diagnosis not present

## 2015-01-26 DIAGNOSIS — M79604 Pain in right leg: Secondary | ICD-10-CM | POA: Diagnosis not present

## 2015-01-26 DIAGNOSIS — F1120 Opioid dependence, uncomplicated: Secondary | ICD-10-CM | POA: Diagnosis not present

## 2015-01-26 DIAGNOSIS — E119 Type 2 diabetes mellitus without complications: Secondary | ICD-10-CM | POA: Diagnosis not present

## 2015-01-26 DIAGNOSIS — Z0389 Encounter for observation for other suspected diseases and conditions ruled out: Secondary | ICD-10-CM | POA: Diagnosis not present

## 2015-01-26 DIAGNOSIS — G8929 Other chronic pain: Secondary | ICD-10-CM | POA: Diagnosis not present

## 2015-01-30 ENCOUNTER — Other Ambulatory Visit: Payer: Self-pay

## 2015-01-30 DIAGNOSIS — Z1231 Encounter for screening mammogram for malignant neoplasm of breast: Secondary | ICD-10-CM

## 2015-02-08 ENCOUNTER — Telehealth: Payer: Self-pay | Admitting: Pain Medicine

## 2015-02-08 NOTE — Telephone Encounter (Signed)
Lorraine Jones, please check [employer] Coverage for [facility], was told by [facility] her coverage is lapsed and she is sched for appt 03-01-15 procedure

## 2015-02-14 DIAGNOSIS — E1121 Type 2 diabetes mellitus with diabetic nephropathy: Secondary | ICD-10-CM | POA: Diagnosis not present

## 2015-02-14 DIAGNOSIS — N182 Chronic kidney disease, stage 2 (mild): Secondary | ICD-10-CM | POA: Diagnosis not present

## 2015-02-14 DIAGNOSIS — G8929 Other chronic pain: Secondary | ICD-10-CM | POA: Diagnosis not present

## 2015-02-14 DIAGNOSIS — M549 Dorsalgia, unspecified: Secondary | ICD-10-CM | POA: Diagnosis not present

## 2015-02-14 DIAGNOSIS — I10 Essential (primary) hypertension: Secondary | ICD-10-CM | POA: Diagnosis not present

## 2015-02-16 ENCOUNTER — Ambulatory Visit: Payer: Medicare HMO

## 2015-02-24 ENCOUNTER — Other Ambulatory Visit: Payer: Self-pay

## 2015-02-24 ENCOUNTER — Ambulatory Visit
Admission: RE | Admit: 2015-02-24 | Discharge: 2015-02-24 | Disposition: A | Discharge status: Home / Self Care | Payer: Medicare Other | Source: Ambulatory Visit | Attending: Family Medicine | Admitting: Family Medicine

## 2015-02-24 DIAGNOSIS — Z1231 Encounter for screening mammogram for malignant neoplasm of breast: Secondary | ICD-10-CM | POA: Diagnosis not present

## 2015-02-27 ENCOUNTER — Other Ambulatory Visit: Payer: Self-pay | Admitting: Pain Medicine

## 2015-02-27 DIAGNOSIS — M169 Osteoarthritis of hip, unspecified: Secondary | ICD-10-CM | POA: Insufficient documentation

## 2015-02-27 DIAGNOSIS — M160 Bilateral primary osteoarthritis of hip: Secondary | ICD-10-CM

## 2015-02-27 DIAGNOSIS — M48062 Spinal stenosis, lumbar region with neurogenic claudication: Secondary | ICD-10-CM | POA: Insufficient documentation

## 2015-02-27 DIAGNOSIS — M5030 Other cervical disc degeneration, unspecified cervical region: Secondary | ICD-10-CM | POA: Insufficient documentation

## 2015-02-27 DIAGNOSIS — E1340 Other specified diabetes mellitus with diabetic neuropathy, unspecified: Secondary | ICD-10-CM

## 2015-02-27 DIAGNOSIS — M961 Postlaminectomy syndrome, not elsewhere classified: Secondary | ICD-10-CM

## 2015-02-27 DIAGNOSIS — M5481 Occipital neuralgia: Secondary | ICD-10-CM

## 2015-02-27 DIAGNOSIS — M533 Sacrococcygeal disorders, not elsewhere classified: Secondary | ICD-10-CM | POA: Insufficient documentation

## 2015-02-27 DIAGNOSIS — M5136 Other intervertebral disc degeneration, lumbar region: Secondary | ICD-10-CM

## 2015-03-01 ENCOUNTER — Encounter: Payer: Self-pay | Admitting: Pain Medicine

## 2015-03-01 ENCOUNTER — Ambulatory Visit: Payer: Medicare Other | Attending: Pain Medicine | Admitting: Pain Medicine

## 2015-03-01 VITALS — BP 123/64 | HR 85 | Temp 98.2°F | Resp 14 | Ht 60.0 in | Wt 195.0 lb

## 2015-03-01 DIAGNOSIS — M1610 Unilateral primary osteoarthritis, unspecified hip: Secondary | ICD-10-CM | POA: Diagnosis not present

## 2015-03-01 DIAGNOSIS — M1288 Other specific arthropathies, not elsewhere classified, other specified site: Secondary | ICD-10-CM | POA: Diagnosis not present

## 2015-03-01 DIAGNOSIS — M533 Sacrococcygeal disorders, not elsewhere classified: Secondary | ICD-10-CM

## 2015-03-01 DIAGNOSIS — M79604 Pain in right leg: Secondary | ICD-10-CM | POA: Diagnosis present

## 2015-03-01 DIAGNOSIS — M5136 Other intervertebral disc degeneration, lumbar region: Secondary | ICD-10-CM | POA: Insufficient documentation

## 2015-03-01 DIAGNOSIS — M5416 Radiculopathy, lumbar region: Secondary | ICD-10-CM | POA: Diagnosis not present

## 2015-03-01 DIAGNOSIS — M961 Postlaminectomy syndrome, not elsewhere classified: Secondary | ICD-10-CM

## 2015-03-01 DIAGNOSIS — M48062 Spinal stenosis, lumbar region with neurogenic claudication: Secondary | ICD-10-CM

## 2015-03-01 DIAGNOSIS — M51369 Other intervertebral disc degeneration, lumbar region without mention of lumbar back pain or lower extremity pain: Secondary | ICD-10-CM

## 2015-03-01 DIAGNOSIS — E1340 Other specified diabetes mellitus with diabetic neuropathy, unspecified: Secondary | ICD-10-CM

## 2015-03-01 DIAGNOSIS — M5030 Other cervical disc degeneration, unspecified cervical region: Secondary | ICD-10-CM

## 2015-03-01 DIAGNOSIS — M545 Low back pain: Secondary | ICD-10-CM | POA: Diagnosis present

## 2015-03-01 DIAGNOSIS — M79605 Pain in left leg: Secondary | ICD-10-CM | POA: Diagnosis present

## 2015-03-01 DIAGNOSIS — M160 Bilateral primary osteoarthritis of hip: Secondary | ICD-10-CM

## 2015-03-01 DIAGNOSIS — M5481 Occipital neuralgia: Secondary | ICD-10-CM

## 2015-03-01 MED ORDER — OXYCODONE HCL 10 MG PO TABS: ORAL_TABLET | ORAL | Status: DC

## 2015-03-01 MED ORDER — FENTANYL 75 MCG/HR TD PT72: MEDICATED_PATCH | TRANSDERMAL | Status: DC

## 2015-03-01 MED ORDER — TRIAMCINOLONE ACETONIDE 40 MG/ML IJ SUSP
INTRAMUSCULAR | Status: AC
  Administered 2015-03-01: 40 mg
  Filled 2015-03-01: qty 1

## 2015-03-01 MED ORDER — ORPHENADRINE CITRATE 30 MG/ML IJ SOLN
INTRAMUSCULAR | Status: AC
  Administered 2015-03-01: 60 mg
  Filled 2015-03-01: qty 2

## 2015-03-01 MED ORDER — FENTANYL 100 MCG/HR TD PT72: MEDICATED_PATCH | TRANSDERMAL | Status: DC

## 2015-03-01 MED ORDER — BUPIVACAINE HCL (PF) 0.25 % IJ SOLN
INTRAMUSCULAR | Status: AC
  Administered 2015-03-01: 30 mL
  Filled 2015-03-01: qty 30

## 2015-03-01 MED ORDER — FENTANYL CITRATE (PF) 100 MCG/2ML IJ SOLN
INTRAMUSCULAR | Status: AC
  Administered 2015-03-01: 100 ug via INTRAVENOUS
  Filled 2015-03-01: qty 2

## 2015-03-01 MED ORDER — CEFUROXIME AXETIL 250 MG PO TABS: 250.0000 mg | ORAL_TABLET | Freq: Two times a day (BID) | ORAL | Status: DC

## 2015-03-01 MED ORDER — MIDAZOLAM HCL 5 MG/5ML IJ SOLN
INTRAMUSCULAR | Status: AC
  Administered 2015-03-01: 3 mg via INTRAVENOUS
  Filled 2015-03-01: qty 5

## 2015-03-01 NOTE — Progress Notes (Signed)
   Subjective:    Patient ID: Lorraine Jones, female    DOB: 03-08-1951, 64 y.o.   MRN: [id number]  HPI PROCEDURE PERFORMED: Lumbosacral selective nerve root block   NOTE: The patient is a 64 y.o. female who returns to [place] for further evaluation and treatment of pain involving the lumbar and lower extremity region. Studies consisting of MRI has revealed the patient to be with evidence of degenerative changes lumbar spine, L5-S1 scar tissue formation, facet arthropathy, diffuse degenerative changes noted throughout the lumbar spine with degenerative joint disease of hip as well as component of diabetic neuropathy. There is concern regarding intraspinal abnormalities contributing to the patient's symptomatology. Patient appears to be with component of lumbar radiculopathy at this time. The risks, benefits, and expectations of the procedure have been explained to the patient who was understanding and in agreement with suggested treatment plan. We will proceed with interventional treatment as discussed and as explained to the patient. The patient is understanding and in agreement with suggested treatment plan.   DESCRIPTION OF PROCEDURE: Lumbosacral selective nerve root block with IV Versed, IV fentanyl conscious sedation, EKG, blood pressure, pulse, and pulse oximetry monitoring. The procedure was performed with the patient in the prone position under fluoroscopic guidance. With the patient in the prone position, Betadine prep of proposed entry site was performed. Local anesthetic skin wheal of proposed needle entry site was prepared with 1.5% plain lidocaine with AP view of the lumbosacral spine.   PROCEDURE #1: Needle placement at the right L 2 vertebral body: A 22 -gauge needle was inserted at the inferior border of the transverse process of the vertebral body with needle placed medial to the midline of the transverse process on AP view of the lumbosacral spine.  Repeat this technique at  L3, L4, L5, on the right side .   Needle placement was then verified on lateral view at all levels with needle tip documented to be in the posterior superior quadrant of the intervertebral foramen of  L 2, L3, L4, and L5. Following negative aspiration for heme and CSF at each level, each level was injected with 3 mL of 0.25% bupivacaine with Kenalog. The patient tolerated the procedure well. A total of 10 mg of Kenalog was utilized for the procedure.   PLAN:  1. Medications: Will continue presently prescribed medications. 2. The patient is to undergo follow-up evaluation with for evaluation of blood pressure and general medical condition status post procedure performed on today's visit. 3. Surgical follow-up evaluation. 4. Neurological evaluation. 5. May consider radiofrequency procedures, implantation type procedures and other treatment pending response to treatment and follow-up evaluation. 6. The patient has been advise do adhere to proper body mechanics and avoid activities which may aggravate condition. The patient has been advised to call the Pain Management Center prior to scheduled return appointment should there be significant change in the patient's condition or should the patient have other concerns regarding condition prior to scheduled return appointment.        Review of Systems     Objective:   Physical Exam        Assessment & Plan:

## 2015-03-01 NOTE — Patient Instructions (Addendum)
Continue present medications and antibioticcs  F/U PCP for evaliation of  BP and general medical  condition.  F/U surgical evaluation.  F/U nrurological evaluation.  May consider radiofrequency rhizolysis or intraspinal procedures pending response to present treatment and F/U evaluation.  Patient to call Pain Management Center should patient have concerns prior to scheduled return appointment.     Pain Management Discharge Instructions  General Discharge Instructions :  If you need to reach your doctor call: Monday-Friday 8:00 am - 4:00 pm at [phone] or toll free [phone].  After clinic hours [phone] to have operator reach doctor.  Bring all of your medication bottles to all your appointments in the pain clinic.  To cancel or reschedule your appointment with Pain Management please remember to call 24 hours in advance to avoid a fee.  Refer to the educational materials which you have been given on: General Risks, I had my Procedure. Discharge Instructions, Post Sedation.  Post Procedure Instructions:  The drugs you were given will stay in your system until tomorrow, so for the next 24 hours you should not drive, make any legal decisions or drink any alcoholic beverages.  You may eat anything you prefer, but it is better to start with liquids then soups and crackers, and gradually work up to solid foods.  Please notify your doctor immediately if you have any unusual bleeding, trouble breathing or pain that is not related to your normal pain.  Depending on the type of procedure that was done, some parts of your body may feel week and/or numb.  This usually clears up by tonight or the next day.  Walk with the use of an assistive device or accompanied by an adult for the 24 hours.  You may use ice on the affected area for the first 24 hours.  Put ice in a Ziploc bag and cover with a towel and place against area 15 minutes on 15 minutes off.  You may switch to heat  after 24 hours.  A prescription for CEFTIN was sent to your pharmacy and should be available for pickup today. A prescription for DURAGESIC X2  And OXYCODONE was given to you today.

## 2015-03-01 NOTE — Progress Notes (Signed)
Safety precautions to be maintained throughout the outpatient stay will include: orient to surroundings, keep bed in low position, maintain call bell within reach at all times, provide assistance with transfer out of bed and ambulation.  

## 2015-03-02 ENCOUNTER — Telehealth: Payer: Self-pay | Admitting: *Deleted

## 2015-03-02 NOTE — Telephone Encounter (Signed)
Denies any complications post procedure.

## 2015-03-18 ENCOUNTER — Encounter: Payer: Self-pay | Admitting: Family Medicine

## 2015-03-21 ENCOUNTER — Ambulatory Visit (INDEPENDENT_AMBULATORY_CARE_PROVIDER_SITE_OTHER): Payer: Medicare Other | Admitting: Family Medicine

## 2015-03-21 ENCOUNTER — Encounter: Payer: Self-pay | Admitting: Family Medicine

## 2015-03-21 VITALS — BP 132/84 | HR 91 | Temp 98.5°F | Resp 16 | Ht 60.0 in | Wt 193.3 lb

## 2015-03-21 DIAGNOSIS — E1143 Type 2 diabetes mellitus with diabetic autonomic (poly)neuropathy: Secondary | ICD-10-CM

## 2015-03-21 DIAGNOSIS — E1142 Type 2 diabetes mellitus with diabetic polyneuropathy: Secondary | ICD-10-CM

## 2015-03-21 DIAGNOSIS — G990 Autonomic neuropathy in diseases classified elsewhere: Secondary | ICD-10-CM

## 2015-03-21 DIAGNOSIS — I10 Essential (primary) hypertension: Secondary | ICD-10-CM | POA: Diagnosis not present

## 2015-03-21 DIAGNOSIS — IMO0002 Reserved for concepts with insufficient information to code with codable children: Secondary | ICD-10-CM

## 2015-03-21 DIAGNOSIS — E1165 Type 2 diabetes mellitus with hyperglycemia: Principal | ICD-10-CM

## 2015-03-21 MED ORDER — PIOGLITAZONE HCL 15 MG PO TABS: 15.0000 mg | ORAL_TABLET | Freq: Every day | ORAL | Status: DC

## 2015-03-21 MED ORDER — INSULIN LISPRO PROT & LISPRO (75-25 MIX) 100 UNIT/ML ~~LOC~~ SUSP: SUBCUTANEOUS | Status: DC

## 2015-03-21 NOTE — Patient Instructions (Signed)
Diabetes Mellitus and Food It is important for you to manage your blood sugar (glucose) level. Your blood glucose level can be greatly affected by what you eat. Eating healthier foods in the appropriate amounts throughout the day at about the same time each day will help you control your blood glucose level. It can also help slow or prevent worsening of your diabetes mellitus. Healthy eating may even help you improve the level of your blood pressure and reach or maintain a healthy weight.  HOW CAN FOOD AFFECT ME? Carbohydrates Carbohydrates affect your blood glucose level more than any other type of food. Your dietitian will help you determine how many carbohydrates to eat at each meal and teach you how to count carbohydrates. Counting carbohydrates is important to keep your blood glucose at a healthy level, especially if you are using insulin or taking certain medicines for diabetes mellitus. Alcohol Alcohol can cause sudden decreases in blood glucose (hypoglycemia), especially if you use insulin or take certain medicines for diabetes mellitus. Hypoglycemia can be a life-threatening condition. Symptoms of hypoglycemia (sleepiness, dizziness, and disorientation) are similar to symptoms of having too much alcohol.  If your health care provider has given you approval to drink alcohol, do so in moderation and use the following guidelines:  Women should not have more than one drink per day, and men should not have more than two drinks per day. One drink is equal to:  12 oz of beer.  5 oz of wine.  1 oz of hard liquor.  Do not drink on an empty stomach.  Keep yourself hydrated. Have water, diet soda, or unsweetened iced tea.  Regular soda, juice, and other mixers might contain a lot of carbohydrates and should be counted. WHAT FOODS ARE NOT RECOMMENDED? As you make food choices, it is important to remember that all foods are not the same. Some foods have fewer nutrients per serving than other  foods, even though they might have the same number of calories or carbohydrates. It is difficult to get your body what it needs when you eat foods with fewer nutrients. Examples of foods that you should avoid that are high in calories and carbohydrates but low in nutrients include:  Trans fats (most processed foods list trans fats on the Nutrition Facts label).  Regular soda.  Juice.  Candy.  Sweets, such as cake, pie, doughnuts, and cookies.  Fried foods. WHAT FOODS CAN I EAT? Have nutrient-rich foods, which will nourish your body and keep you healthy. The food you should eat also will depend on several factors, including:  The calories you need.  The medicines you take.  Your weight.  Your blood glucose level.  Your blood pressure level.  Your cholesterol level. You also should eat a variety of foods, including:  Protein, such as meat, poultry, fish, tofu, nuts, and seeds (lean animal proteins are best).  Fruits.  Vegetables.  Dairy products, such as milk, cheese, and yogurt (low fat is best).  Breads, grains, pasta, cereal, rice, and beans.  Fats such as olive oil, trans fat-free margarine, canola oil, avocado, and olives. DOES EVERYONE WITH DIABETES MELLITUS HAVE THE SAME MEAL PLAN? Because every person with diabetes mellitus is different, there is not one meal plan that works for everyone. It is very important that you meet with a dietitian who will help you create a meal plan that is just right for you. Document Released: 06/13/2005 Document Revised: 09/21/2013 Document Reviewed: 08/13/2013 [employer] Patient Information 2015 [employer]. This   information is not intended to replace advice given to you by your health care provider. Make sure you discuss any questions you have with your health care provider.  

## 2015-03-21 NOTE — Progress Notes (Signed)
Name: Lorraine Jones   MRN: [id number]    DOB: 19-Oct-1950   Date:03/21/2015       Progress Note  Subjective  Chief Complaint  Chief Complaint  Patient presents with  . Follow-up    1 month DM  . Diabetes    checks BG 3x qd Low-137,High-358  Pt states she went out of town and left the new insulin Humalog and has been replacing it with lantus    HPI  DMII insulin requiring: glucose has not been controlled, she was on Lantus 60 units daily, metfomin and Onglyza. However because of cost she was not compliant with Onglyza . Last hgbA1C was 9.9 % , we changed to Novolog Mix 75/25 one month ago and Pioglitazone,  but she went on vacation and forgot the insulin at home . She also states she never got the prescription for pioglitazone.  Glucose was between 170-265 fasting and 150-288 post-prandially while on the new insulin. She denies blurred vision, polydipsia or polyuria. Going tomorrow to have her eye exam.    HTN: taking medication and bp is at goal, denies side effects, chest pain or sob  Patient Active Problem List   Diagnosis Date Noted  . DDD (degenerative disc disease), lumbar 02/27/2015  . Lumbar post-laminectomy syndrome 02/27/2015  . Neuropathy due to secondary diabetes 02/27/2015  . Spinal stenosis, lumbar region, with neurogenic claudication 02/27/2015  . Sacroiliac joint dysfunction of both sides 02/27/2015  . Degenerative joint disease (DJD) of hip 02/27/2015  . DDD (degenerative disc disease), cervical 02/27/2015  . Bilateral occipital neuralgia 02/27/2015  . Chronic constipation 06/02/2014  . Hyponatremia 06/01/2014  . Hypertension 06/01/2014  . Uncontrolled type 2 diabetes with peripheral autonomic neuropathy 06/01/2014    History  Substance Use Topics  . Smoking status: Current Every Day Smoker -- 1.00 packs/day for 12 years    Types: Cigarettes  . Smokeless tobacco: Never Used  . Alcohol Use: No     Current outpatient prescriptions:  .  aspirin EC 81 MG  tablet, Take 81 mg by mouth daily., Disp: , Rfl:  .  carvedilol (COREG) 3.125 MG tablet, Take 3.125 mg by mouth 2 (two) times daily with a meal., Disp: , Rfl:  .  cefUROXime (CEFTIN) 500 MG tablet, Take 1 tablet (500 mg total) by mouth 2 (two) times daily with a meal., Disp: 10 tablet, Rfl: 0 .  dorzolamide-timolol (COSOPT) 22.3-6.8 MG/ML ophthalmic solution, Place 1 drop into both eyes 2 (two) times daily., Disp: , Rfl:  .  fentaNYL (DURAGESIC - DOSED MCG/HR) 100 MCG/HR, Apply  1 patch to skin every 2 days if tolerated. The patch will be applied with a 75 g per hour fentanyl patch, Disp: 15 patch, Rfl: 0 .  fentaNYL (DURAGESIC - DOSED MCG/HR) 75 MCG/HR, Apply 1 patch to skin every 2 days if tolerated. The patch will be applied with a 100 g per hour fentanyl patch, Disp: 15 patch, Rfl: Seroquel .  metFORMIN (GLUCOPHAGE) 1000 MG tablet, Take 1,000 mg by mouth 2 (two) times daily with a meal., Disp: , Rfl:  .  Multiple Vitamins-Minerals (MULTIVITAMIN WITH MINERALS) tablet, Take 1 tablet by mouth daily., Disp: , Rfl:  .  Olmesartan-Amlodipine-HCTZ (TRIBENZOR) 40-10-25 MG TABS, Take 1 tablet by mouth daily., Disp: , Rfl:  .  omeprazole (PRILOSEC) 20 MG capsule, Take 20 mg by mouth daily., Disp: , Rfl:  .  Oxycodone HCl 10 MG TABS, Limit 1 tab by mouth 4-6 times per day if tolerated, Disp:  100 tablet, Rfl: 0 .  polyethylene glycol (MIRALAX / GLYCOLAX) packet, Take 17 g by mouth daily., Disp: 14 each, Rfl: 0 .  pravastatin (PRAVACHOL) 40 MG tablet, Take 40 mg by mouth daily., Disp: , Rfl:  .  insulin lispro protamine-lispro (HUMALOG MIX 75/25) (75-25) 100 UNIT/ML SUSP injection, 30 units before breakfast and 20 units before dinner, Disp: 10 mL, Rfl: 1 .  pioglitazone (ACTOS) 15 MG tablet, Take 1 tablet (15 mg total) by mouth daily., Disp: 30 tablet, Rfl: 2  Allergies  Allergen Reactions  . Diclofenac Sodium Swelling  . Lodine [Etodolac] Swelling  . Naproxen Sodium Swelling  . Neurontin [Gabapentin]  Swelling    ROS  Constitutional: Negative for fever or weight change.  Respiratory: Negative for cough and shortness of breath.   Cardiovascular: Negative for chest pain or palpitations.  Gastrointestinal: Negative for abdominal pain, no bowel changes.  Musculoskeletal: chronic back pain and uses a cane to assist with ambulation  Skin: Negative for rash.  Neurological: Negative for dizziness or headache.  No other specific complaints in a complete review of systems (except as listed in HPI above).   Objective  Filed Vitals:   03/21/15 0911  BP: 132/84  Pulse: 91  Temp: 98.5 F (36.9 C)  TempSrc: Oral  Resp: 16  Height: 5' (1.524 m)  Weight: 193 lb 4.8 oz (87.68 kg)  SpO2: 96%    Body mass index is 37.75 kg/(m^2).    Physical Exam  Constitutional: Patient appears well-developed and well-nourished. No distress.  Eyes:  No scleral icterus.  Neck: Normal range of motion. Neck supple. Cardiovascular: Normal rate, regular rhythm and normal heart sounds.  No murmur heard. No BLE edema. Pulmonary/Chest: Effort normal and breath sounds normal. No respiratory distress. Abdominal: Soft.  There is no tenderness. Psychiatric: Patient has a normal mood and affect. behavior is normal. Judgment and thought content normal.   Depression screen PHQ 2/9 03/01/2015  Decreased Interest 1  Down, Depressed, Hopeless 1  PHQ - 2 Score 2  Altered sleeping 1  Tired, decreased energy 1  Change in appetite 1  Feeling bad or failure about yourself  0  Trouble concentrating 0  Moving slowly or fidgety/restless 0  Suicidal thoughts 0  PHQ-9 Score 5  Difficult doing work/chores Not difficult at all    No falls over the last year  Diabetic Foot Exam - Simple   Simple Foot Form  Visual Inspection  No deformities, no ulcerations, no other skin breakdown bilaterally:  Yes  Sensation Testing  Intact to touch and monofilament testing bilaterally:  Yes  Pulse Check  Posterior Tibialis and  Dorsalis pulse intact bilaterally:  Yes  Comments      Assessment & Plan  1. Uncontrolled type 2 diabetes with peripheral autonomic neuropathy Explained importance of not skipping meals, following a diabetic diet, increasing dose of Humalog Mix and monitor for hypoglycemia. Add Actos and continue Metformin - insulin lispro protamine-lispro (HUMALOG MIX 75/25) (75-25) 100 UNIT/ML SUSP injection; 30 units before breakfast and 20 units before dinner  Dispense: 10 mL; Refill: 1 - pioglitazone (ACTOS) 15 MG tablet; Take 1 tablet (15 mg total) by mouth daily.  Dispense: 30 tablet; Refill: 2   2. Essential hypertension Continue medication

## 2015-03-22 DIAGNOSIS — H40023 Open angle with borderline findings, high risk, bilateral: Secondary | ICD-10-CM | POA: Diagnosis not present

## 2015-03-30 ENCOUNTER — Ambulatory Visit: Payer: Medicare Other | Attending: Pain Medicine | Admitting: Pain Medicine

## 2015-03-30 ENCOUNTER — Encounter: Payer: Self-pay | Admitting: Pain Medicine

## 2015-03-30 VITALS — BP 145/61 | HR 89 | Temp 96.5°F | Resp 20 | Ht 60.0 in | Wt 193.0 lb

## 2015-03-30 DIAGNOSIS — M79604 Pain in right leg: Secondary | ICD-10-CM | POA: Diagnosis present

## 2015-03-30 DIAGNOSIS — E1140 Type 2 diabetes mellitus with diabetic neuropathy, unspecified: Secondary | ICD-10-CM | POA: Diagnosis not present

## 2015-03-30 DIAGNOSIS — M79609 Pain in unspecified limb: Secondary | ICD-10-CM | POA: Diagnosis not present

## 2015-03-30 DIAGNOSIS — M1610 Unilateral primary osteoarthritis, unspecified hip: Secondary | ICD-10-CM | POA: Diagnosis not present

## 2015-03-30 DIAGNOSIS — M533 Sacrococcygeal disorders, not elsewhere classified: Secondary | ICD-10-CM | POA: Diagnosis not present

## 2015-03-30 DIAGNOSIS — M5481 Occipital neuralgia: Secondary | ICD-10-CM

## 2015-03-30 DIAGNOSIS — M961 Postlaminectomy syndrome, not elsewhere classified: Secondary | ICD-10-CM

## 2015-03-30 DIAGNOSIS — M47817 Spondylosis without myelopathy or radiculopathy, lumbosacral region: Secondary | ICD-10-CM | POA: Diagnosis not present

## 2015-03-30 DIAGNOSIS — M160 Bilateral primary osteoarthritis of hip: Secondary | ICD-10-CM

## 2015-03-30 DIAGNOSIS — M5416 Radiculopathy, lumbar region: Secondary | ICD-10-CM | POA: Insufficient documentation

## 2015-03-30 DIAGNOSIS — M5136 Other intervertebral disc degeneration, lumbar region: Secondary | ICD-10-CM | POA: Insufficient documentation

## 2015-03-30 DIAGNOSIS — M48062 Spinal stenosis, lumbar region with neurogenic claudication: Secondary | ICD-10-CM

## 2015-03-30 DIAGNOSIS — Z9889 Other specified postprocedural states: Secondary | ICD-10-CM | POA: Insufficient documentation

## 2015-03-30 DIAGNOSIS — M5030 Other cervical disc degeneration, unspecified cervical region: Secondary | ICD-10-CM

## 2015-03-30 DIAGNOSIS — M791 Myalgia: Secondary | ICD-10-CM | POA: Diagnosis not present

## 2015-03-30 DIAGNOSIS — M545 Low back pain: Secondary | ICD-10-CM | POA: Diagnosis present

## 2015-03-30 DIAGNOSIS — E1340 Other specified diabetes mellitus with diabetic neuropathy, unspecified: Secondary | ICD-10-CM

## 2015-03-30 DIAGNOSIS — E871 Hypo-osmolality and hyponatremia: Secondary | ICD-10-CM

## 2015-03-30 MED ORDER — FENTANYL 100 MCG/HR TD PT72: MEDICATED_PATCH | TRANSDERMAL | Status: DC

## 2015-03-30 MED ORDER — OXYCODONE HCL 10 MG PO TABS: ORAL_TABLET | ORAL | Status: DC

## 2015-03-30 MED ORDER — FENTANYL 75 MCG/HR TD PT72: MEDICATED_PATCH | TRANSDERMAL | Status: DC

## 2015-03-30 NOTE — Progress Notes (Signed)
Safety precautions to be maintained throughout the outpatient stay will include: orient to surroundings, keep bed in low position, maintain call bell within reach at all times, provide assistance with transfer out of bed and ambulation.  

## 2015-03-30 NOTE — Progress Notes (Signed)
   Subjective:    Patient ID: Lorraine Jones, female    DOB: 1951/02/20, 64 y.o.   MRN: [id number]  HPI  Patient is 64 year old female returns to [facility] for further evaluation treatment of pain involving the lower back lower extremity region on the right especially. Patient is with prior surgical intervention of the lumbar region and is also with evidence of degenerative joint disease of the hip there is concern regarding both intra-articular abnormalities of the hip as well as intraspinal abnormalities contributing to patient's symptomatology in addition to component of diabetic neuropathy. At the present time we will avoid interventional treatment patient has had significant improvement following previous lumbosacral selective [facility]. We will consider interventional treatment at the hip as discussed with patient pending follow-up evaluation and continue presently prescribed medications at this time. All understanding and agree with suggested treatment plan.     Review of Systems     Objective:   Physical Exam  Mild tinnitus of the splenius capitis and occipitalis region was noted with mild tennis over the cervical facet cervical paraspinal musculature region and thoracic facet thoracic paraspinal musculature region. No crepitus of the thoracic region was noted. Mild tinnitus of the acromioclavicular glenohumeral joint region and unremarkable Spurling's maneuver. Palpation over the lumbar paraspinal muscles region lumbar facet region associated with moderate to moderately severe discomfort on the right compared to the left. Extension and palpation of the lumbar facets reproduce moderate to moderately severe discomfort. Straight leg raising limited to approximately 20 without a definite increased pain with dorsiflexion noted there was decreased sensation of the lower extremities in a stocking type distribution. Palpation over the PSIS and  PII S region reproduced moderate discomfort. Patrick's maneuver associated with severe pain when performed on the right. There was mild tenderness of the greater trochanteric region iliotibial band region. There was negative clonus negative Homans. DTRs difficult to elicit patient had difficulty relaxing. Abdomen nontender with no costovertebral maintenance noted.    Assessment & Plan:    Degenerative disc disease lumbar spine Status post prior surgical intervention lumbar region with L5-S1 scar formation, facet hypertrophy, and diffuse degenerative changes noted throughout the lumbar spine  Lumbar facet syndrome  Lumbar radiculopathy  Degenerative joint disease of hip  Diabetic neuropathy  Sacroiliac joint dysfunction    Plan   Continue present medications oxycodone and fentanyl patch  F/U PCP Dr. Lucita Lora for evaliation of  BP and general medical  condition.  F/U surgical evaluation  F/U neurological evaluation  May consider radiofrequency rhizolysis or intraspinal procedures pending response to present treatment and F/U evaluation.  [facility] to [facility] should patient have concerns prior to scheduled return appointment.

## 2015-03-30 NOTE — Patient Instructions (Addendum)
Continue present medications fentanyl patch and oxycodone   F/U PCP for evaliation of  BP and general medical  condition.  F/U surgical evaluation  F/U neurological evaluation  May consider radiofrequency rhizolysis or intraspinal procedures pending response to present treatment and F/U evaluation.  As discussed we may consider radiofrequency procedure for treating the pain of your hip  [facility] to call [facility] should patient have concerns prior to scheduled return appointment.

## 2015-04-27 ENCOUNTER — Encounter: Payer: Self-pay | Admitting: Pain Medicine

## 2015-04-27 ENCOUNTER — Ambulatory Visit: Payer: Medicare Other | Attending: Pain Medicine | Admitting: Pain Medicine

## 2015-04-27 VITALS — BP 135/85 | HR 95 | Temp 98.4°F | Resp 18 | Ht 63.0 in | Wt 193.0 lb

## 2015-04-27 DIAGNOSIS — M47817 Spondylosis without myelopathy or radiculopathy, lumbosacral region: Secondary | ICD-10-CM | POA: Diagnosis not present

## 2015-04-27 DIAGNOSIS — M160 Bilateral primary osteoarthritis of hip: Secondary | ICD-10-CM

## 2015-04-27 DIAGNOSIS — M5136 Other intervertebral disc degeneration, lumbar region: Secondary | ICD-10-CM | POA: Diagnosis not present

## 2015-04-27 DIAGNOSIS — E1140 Type 2 diabetes mellitus with diabetic neuropathy, unspecified: Secondary | ICD-10-CM | POA: Insufficient documentation

## 2015-04-27 DIAGNOSIS — M533 Sacrococcygeal disorders, not elsewhere classified: Secondary | ICD-10-CM

## 2015-04-27 DIAGNOSIS — E1340 Other specified diabetes mellitus with diabetic neuropathy, unspecified: Secondary | ICD-10-CM

## 2015-04-27 DIAGNOSIS — M79609 Pain in unspecified limb: Secondary | ICD-10-CM | POA: Diagnosis not present

## 2015-04-27 DIAGNOSIS — M545 Low back pain: Secondary | ICD-10-CM | POA: Diagnosis present

## 2015-04-27 DIAGNOSIS — M5416 Radiculopathy, lumbar region: Secondary | ICD-10-CM | POA: Insufficient documentation

## 2015-04-27 DIAGNOSIS — M5030 Other cervical disc degeneration, unspecified cervical region: Secondary | ICD-10-CM

## 2015-04-27 DIAGNOSIS — Z9889 Other specified postprocedural states: Secondary | ICD-10-CM | POA: Diagnosis not present

## 2015-04-27 DIAGNOSIS — M5481 Occipital neuralgia: Secondary | ICD-10-CM

## 2015-04-27 DIAGNOSIS — M48062 Spinal stenosis, lumbar region with neurogenic claudication: Secondary | ICD-10-CM

## 2015-04-27 DIAGNOSIS — M791 Myalgia: Secondary | ICD-10-CM | POA: Diagnosis not present

## 2015-04-27 DIAGNOSIS — M1610 Unilateral primary osteoarthritis, unspecified hip: Secondary | ICD-10-CM | POA: Insufficient documentation

## 2015-04-27 DIAGNOSIS — M961 Postlaminectomy syndrome, not elsewhere classified: Secondary | ICD-10-CM

## 2015-04-27 MED ORDER — FENTANYL 100 MCG/HR TD PT72: MEDICATED_PATCH | TRANSDERMAL | Status: DC

## 2015-04-27 MED ORDER — FENTANYL 75 MCG/HR TD PT72: MEDICATED_PATCH | TRANSDERMAL | Status: DC

## 2015-04-27 MED ORDER — OXYCODONE HCL 10 MG PO TABS: ORAL_TABLET | ORAL | Status: DC

## 2015-04-27 NOTE — Patient Instructions (Addendum)
Continue present medications oxycodone and fentanyl patch   Block of nerves to the sacroiliac joint to be performed Wednesday, 05/10/2015  F/U PCP Dr. Ancil Boozer for evaliation of  BP and general medical  condition.  F/U surgical evaluation  F/U neurological evaluation  May consider radiofrequency rhizolysis or intraspinal procedures pending response to present treatment and F/U evaluation.  Patient to call [facility] should patient have concerns prior to scheduled return appointment. GENERAL RISKS AND COMPLICATIONS  What are the risk, side effects and possible complications? Generally speaking, most procedures are safe.  However, with any procedure there are risks, side effects, and the possibility of complications.  The risks and complications are dependent upon the sites that are lesioned, or the type of nerve block to be performed.  The closer the procedure is to the spine, the more serious the risks are.  Great care is taken when placing the radio frequency needles, block needles or lesioning probes, but sometimes complications can occur. 1. Infection: Any time there is an injection through the skin, there is a risk of infection.  This is why sterile conditions are used for these blocks.  There are four possible types of infection. 1. Localized skin infection. 2. Central Nervous System Infection-This can be in the form of Meningitis, which can be deadly. 3. Epidural Infections-This can be in the form of an epidural abscess, which can cause pressure inside of the spine, causing compression of the spinal cord with subsequent paralysis. This would require an emergency surgery to decompress, and there are no guarantees that the patient would recover from the paralysis. 4. Discitis-This is an infection of the intervertebral discs.  It occurs in about 1% of discography procedures.  It is difficult to treat and it may lead to surgery.        2. Pain: the needles have to go through skin  and soft tissues, will cause soreness.       3. Damage to internal structures:  The nerves to be lesioned may be near blood vessels or    other nerves which can be potentially damaged.       4. Bleeding: Bleeding is more common if the patient is taking blood thinners such as  aspirin, Coumadin, Ticiid, Plavix, etc., or if he/she have some genetic predisposition  such as hemophilia. Bleeding into the spinal canal can cause compression of the spinal  cord with subsequent paralysis.  This would require an emergency surgery to  decompress and there are no guarantees that the patient would recover from the  paralysis.       5. Pneumothorax:  Puncturing of a lung is a possibility, every time a needle is introduced in  the area of the chest or upper back.  Pneumothorax refers to free air around the  collapsed lung(s), inside of the thoracic cavity (chest cavity).  Another two possible  complications related to a similar event would include: Hemothorax and Chylothorax.   These are variations of the Pneumothorax, where instead of air around the collapsed  lung(s), you may have blood or chyle, respectively.       6. Spinal headaches: They may occur with any procedures in the area of the spine.       7. Persistent CSF (Cerebro-Spinal Fluid) leakage: This is a rare problem, but may occur  with prolonged intrathecal or epidural catheters either due to the formation of a fistulous  track or a dural tear.       8. Nerve damage:  By working so close to the spinal cord, there is always a possibility of  nerve damage, which could be as serious as a permanent spinal cord injury with  paralysis.       9. Death:  Although rare, severe deadly allergic reactions known as "Anaphylactic  reaction" can occur to any of the medications used.      10. Worsening of the symptoms:  We can always make thing worse.  What are the chances of something like this happening? Chances of any of this occuring are extremely low.  By statistics,  you have more of a chance of getting killed in a motor vehicle accident: while driving to the hospital than any of the above occurring .  Nevertheless, you should be aware that they are possibilities.  In general, it is similar to taking a shower.  Everybody knows that you can slip, hit your head and get killed.  Does that mean that you should not shower again?  Nevertheless always keep in mind that statistics do not mean anything if you happen to be on the wrong side of them.  Even if a procedure has a 1 (one) in a 1,000,000 (million) chance of going wrong, it you happen to be that one..Also, keep in mind that by statistics, you have more of a chance of having something go wrong when taking medications.  Who should not have this procedure? If you are on a blood thinning medication (e.g. Coumadin, Plavix, see list of "Blood Thinners"), or if you have an active infection going on, you should not have the procedure.  If you are taking any blood thinners, please inform your physician.  How should I prepare for this procedure?  Do not eat or drink anything at least six hours prior to the procedure.  Bring a driver with you .  It cannot be a taxi.  Come accompanied by an adult that can drive you back, and that is strong enough to help you if your legs get weak or numb from the local anesthetic.  Take all of your medicines the morning of the procedure with just enough water to swallow them.  If you have diabetes, make sure that you are scheduled to have your procedure done first thing in the morning, whenever possible.  If you have diabetes, take only half of your insulin dose and notify our nurse that you have done so as soon as you arrive at the clinic.  If you are diabetic, but only take blood sugar pills (oral hypoglycemic), then do not take them on the morning of your procedure.  You may take them after you have had the procedure.  Do not take aspirin or any aspirin-containing medications, at  least eleven (11) days prior to the procedure.  They may prolong bleeding.  Wear loose fitting clothing that may be easy to take off and that you would not mind if it got stained with Betadine or blood.  Do not wear any jewelry or perfume  Remove any nail coloring.  It will interfere with some of our monitoring equipment.  NOTE: Remember that this is not meant to be interpreted as a complete list of all possible complications.  Unforeseen problems may occur.  BLOOD THINNERS The following drugs contain aspirin or other products, which can cause increased bleeding during surgery and should not be taken for 2 weeks prior to and 1 week after surgery.  If you should need take something for relief of minor pain, you may  take acetaminophen which is found in Tylenol,m Datril, Anacin-3 and Panadol. It is not blood thinner. The products listed below are.  Do not take any of the products listed below in addition to any listed on your instruction sheet.  A.P.C or A.P.C with Codeine Codeine Phosphate Capsules #3 Ibuprofen Ridaura  ABC compound Congesprin Imuran rimadil  Advil Cope Indocin Robaxisal  Alka-Seltzer Effervescent Pain Reliever and Antacid Coricidin or Coricidin-D  Indomethacin Rufen  Alka-Seltzer plus Cold Medicine Cosprin Ketoprofen S-A-C Tablets  Anacin Analgesic Tablets or Capsules Coumadin Korlgesic Salflex  Anacin Extra Strength Analgesic tablets or capsules CP-2 Tablets Lanoril Salicylate  Anaprox Cuprimine Capsules Levenox Salocol  Anexsia-D Dalteparin Magan Salsalate  Anodynos Darvon compound Magnesium Salicylate Sine-off  Ansaid Dasin Capsules Magsal Sodium Salicylate  Anturane Depen Capsules Marnal Soma  APF Arthritis pain formula Dewitt's Pills Measurin Stanback  Argesic Dia-Gesic Meclofenamic Sulfinpyrazone  Arthritis Bayer Timed Release Aspirin Diclofenac Meclomen Sulindac  Arthritis pain formula Anacin Dicumarol Medipren Supac  Analgesic (Safety coated) Arthralgen  Diffunasal Mefanamic Suprofen  Arthritis Strength Bufferin Dihydrocodeine Mepro Compound Suprol  Arthropan liquid Dopirydamole Methcarbomol with Aspirin Synalgos  ASA tablets/Enseals Disalcid Micrainin Tagament  Ascriptin Doan's Midol Talwin  Ascriptin A/D Dolene Mobidin Tanderil  Ascriptin Extra Strength Dolobid Moblgesic Ticlid  Ascriptin with Codeine Doloprin or Doloprin with Codeine Momentum Tolectin  Asperbuf Duoprin Mono-gesic Trendar  Aspergum Duradyne Motrin or Motrin IB Triminicin  Aspirin plain, buffered or enteric coated Durasal Myochrisine Trigesic  Aspirin Suppositories Easprin Nalfon Trillsate  Aspirin with Codeine Ecotrin Regular or Extra Strength Naprosyn Uracel  Atromid-S Efficin Naproxen Ursinus  Auranofin Capsules Elmiron Neocylate Vanquish  Axotal Emagrin Norgesic Verin  Azathioprine Empirin or Empirin with Codeine Normiflo Vitamin E  Azolid Emprazil Nuprin Voltaren  Bayer Aspirin plain, buffered or children's or timed BC Tablets or powders Encaprin Orgaran Warfarin Sodium  Buff-a-Comp Enoxaparin Orudis Zorpin  Buff-a-Comp with Codeine Equegesic Os-Cal-Gesic   Buffaprin Excedrin plain, buffered or Extra Strength Oxalid   Bufferin Arthritis Strength Feldene Oxphenbutazone   Bufferin plain or Extra Strength Feldene Capsules Oxycodone with Aspirin   Bufferin with Codeine Fenoprofen Fenoprofen Pabalate or Pabalate-SF   Buffets II Flogesic Panagesic   Buffinol plain or Extra Strength Florinal or Florinal with Codeine Panwarfarin   Buf-Tabs Flurbiprofen Penicillamine   Butalbital Compound Four-way cold tablets Penicillin   Butazolidin Fragmin Pepto-Bismol   Carbenicillin Geminisyn Percodan   Carna Arthritis Reliever Geopen Persantine   Carprofen Gold's salt Persistin   Chloramphenicol Goody's Phenylbutazone   Chloromycetin Haltrain Piroxlcam   Clmetidine heparin Plaquenil   Cllnoril Hyco-pap Ponstel   Clofibrate Hydroxy chloroquine Propoxyphen         Before  stopping any of these medications, be sure to consult the physician who ordered them.  Some, such as Coumadin (Warfarin) are ordered to prevent or treat serious conditions such as "deep thrombosis", "pumonary embolisms", and other heart problems.  The amount of time that you may need off of the medication may also vary with the medication and the reason for which you were taking it.  If you are taking any of these medications, please make sure you notify your pain physician before you undergo any procedures.         Selective Nerve Root Block Patient Information  Description: Specific nerve roots exit the spinal canal and these nerves can be compressed and inflamed by a bulging disc and bone spurs.  By injecting steroids on the nerve root, we can potentially decrease the inflammation surrounding these nerves,  which often leads to decreased pain.  Also, by injecting local anesthesia on the nerve root, this can provide [place] helpful information to give to your referring doctor if it decreases your pain.  Selective nerve root blocks can be done along the spine from the neck to the low back depending on the location of your pain.   After numbing the skin with local anesthesia, a small needle is passed to the nerve root and the position of the needle is verified using x-ray pictures.  After the needle is in correct position, we then deposit the medication.  You may experience a pressure sensation while this is being done.  The entire block usually lasts less than 15 minutes.  Conditions that may be treated with selective nerve root blocks:  Low back and leg pain  Spinal stenosis  Diagnostic block prior to potential surgery  Neck and arm pain  Post laminectomy syndrome  Preparation for the injection:  1. Do not eat any solid food or dairy products within 6 hours of your appointment. 2. You may drink clear liquids up to 2 hours before an appointment.  Clear liquids include water, black coffee,  juice or soda.  No milk or cream please. 3. You may take your regular medications, including pain medications, with a sip of water before your appointment.  Diabetics should hold regular insulin (if taken separately) and take 1/2 normal NPH dose the morning of the procedure.  Carry some sugar containing items with you to your appointment. 4. A driver must accompany you and be prepared to drive you home after your procedure. 5. Bring all your current medications with you. 6. An IV may be inserted and sedation may be given at the discretion of the physician. 7. A blood pressure cuff, EKG, and other monitors will often be applied during the procedure.  Some patients may need to have extra oxygen administered for a short period. 8. You will be asked to provide medical information, including allergies, prior to the procedure.  We must know immediately if you are taking blood  Thinners (like Coumadin) or if you are allergic to IV iodine contrast (dye).  Possible side-effects: All are usually temporary  Bleeding from needle site  Light headedness  Numbness and tingling  Decreased blood pressure  Weakness in arms/legs  Pressure sensation in back/neck  Pain at injection site (several days)  Possible complications: All are extremely rare  Infection  Nerve injury  Spinal headache (a headache wore with upright position)  Call if you experience:  Fever/chills associated with headache or increased back/neck pain  Headache worsened by an upright position  New onset weakness or numbness of an extremity below the injection site  Hives or difficulty breathing (go to the emergency room)  Inflammation or drainage at the injection site(s)  Severe back/neck pain greater than usual  New symptoms which are concerning to you  Please note:  Although the local anesthetic injected can often make your back or neck feel good for several hours after the injection the pain will likely return.   It takes 3-5 days for steroids to work on the nerve root. You may not notice any pain relief for at least one week.  If effective, we will often do a series of 3 injections spaced 3-6 weeks apart to maximally decrease your pain.    If you have any questions, please call [phone] [facility]

## 2015-04-27 NOTE — Progress Notes (Signed)
Discharged to home ambulatory with script in hand for Fentanyl patch 100 mcg and fentanyl patch 75 mcg, and oxycodone.  Pre procedure instructions given with teach back 3 done.

## 2015-04-27 NOTE — Progress Notes (Signed)
   Subjective:    Patient ID: Lorraine Jones, female    DOB: [birth year], 64 y.o.   MRN: [id number]  HPI  Patient is 64 year old female returns to [facility] for further evaluation and treatment of pain involving the lower back lower extremity region. Patient states she has severe pain of the right hip and buttocks region. Patient states that pain is aggravated by standing walking climbing stairs. Patient missed increased pain with twisting turning maneuvers and states that she has difficulty turning over in bed as well. We discussed patient's condition and is concern regarding significant component of pain being due to sacroiliac joint dysfunction. We will proceed with block of nerves to the sacroiliac joint at time return appointment. We will continue fentanyl patch and oxycodone as prescribed. The patient was understanding and agree persisted    Review of Systems     Objective:   Physical Exam  There was mild tenderness of the splenius capitis and occipitalis muscles. There was mild tenderness of the cervical facet cervical paraspinal musculature region as well as the thoracic facet thoracic paraspinal musculature region. There appeared to be unremarkable Spurling's maneuver. Patient appeared to be bilaterally equal grip strength Tinel and Phalen's maneuver without increased pain of significant degree. Palpation over the region of the lower thoracic paraspinal musculature region was a tends to palpation of moderate degree. Palpation over the lumbar facets lumbar paraspinal muscles reproduced pain of moderate degree. Palpation over the PSIS and PII S region on the right reproduced severe pain. Straight leg raising limited to 20 without increased pain with dorsiflexion noted. There was negative clonus negative Homans. Abdomen was nontender and no costovertebral angle tenderness was noted.      Assessment & Plan:   Degenerative disc disease lumbar spine Status post prior surgical  intervention lumbar region with L5-S1 scar formation, facet hypertrophy, and diffuse degenerative changes noted throughout the lumbar spine  Lumbar facet syndrome  Lumbar radiculopathy  Sacroiliac joint disease  Degenerative joint disease of hip  Diabetic neuropathy  Plan   Continue present medications oxycodone and fentanyl patch  Block of nerves to sacroiliac joint to be performed at time of return appointment  F/U PCP Dr. Lucita Lora for evaliation of  BP and general medical  condition.  F/U surgical evaluation  F/U neurological evaluation  May consider radiofrequency rhizolysis or intraspinal procedures pending response to present treatment and F/U evaluation.  Patient to call [facility] should patient have concerns prior to scheduled return appointment.

## 2015-05-10 ENCOUNTER — Encounter: Payer: Self-pay | Admitting: Pain Medicine

## 2015-05-10 ENCOUNTER — Ambulatory Visit: Payer: Medicare Other | Attending: Pain Medicine | Admitting: Pain Medicine

## 2015-05-10 VITALS — BP 141/68 | HR 88 | Temp 98.3°F | Resp 16 | Ht 61.0 in | Wt 193.0 lb

## 2015-05-10 DIAGNOSIS — M79604 Pain in right leg: Secondary | ICD-10-CM | POA: Diagnosis present

## 2015-05-10 DIAGNOSIS — M47817 Spondylosis without myelopathy or radiculopathy, lumbosacral region: Secondary | ICD-10-CM | POA: Diagnosis not present

## 2015-05-10 DIAGNOSIS — M5481 Occipital neuralgia: Secondary | ICD-10-CM

## 2015-05-10 DIAGNOSIS — M47816 Spondylosis without myelopathy or radiculopathy, lumbar region: Secondary | ICD-10-CM | POA: Diagnosis not present

## 2015-05-10 DIAGNOSIS — M545 Low back pain: Secondary | ICD-10-CM | POA: Diagnosis present

## 2015-05-10 DIAGNOSIS — M160 Bilateral primary osteoarthritis of hip: Secondary | ICD-10-CM

## 2015-05-10 DIAGNOSIS — Z9889 Other specified postprocedural states: Secondary | ICD-10-CM | POA: Diagnosis not present

## 2015-05-10 DIAGNOSIS — M5136 Other intervertebral disc degeneration, lumbar region: Secondary | ICD-10-CM | POA: Diagnosis not present

## 2015-05-10 DIAGNOSIS — E1340 Other specified diabetes mellitus with diabetic neuropathy, unspecified: Secondary | ICD-10-CM

## 2015-05-10 DIAGNOSIS — M48062 Spinal stenosis, lumbar region with neurogenic claudication: Secondary | ICD-10-CM

## 2015-05-10 DIAGNOSIS — M533 Sacrococcygeal disorders, not elsewhere classified: Secondary | ICD-10-CM

## 2015-05-10 DIAGNOSIS — M961 Postlaminectomy syndrome, not elsewhere classified: Secondary | ICD-10-CM

## 2015-05-10 DIAGNOSIS — K5909 Other constipation: Secondary | ICD-10-CM

## 2015-05-10 DIAGNOSIS — M5030 Other cervical disc degeneration, unspecified cervical region: Secondary | ICD-10-CM

## 2015-05-10 DIAGNOSIS — M79605 Pain in left leg: Secondary | ICD-10-CM | POA: Diagnosis present

## 2015-05-10 MED ORDER — FENTANYL CITRATE (PF) 100 MCG/2ML IJ SOLN
INTRAMUSCULAR | Status: AC
  Administered 2015-05-10: 100 ug via INTRAVENOUS
  Filled 2015-05-10: qty 2

## 2015-05-10 MED ORDER — OXYCODONE HCL 10 MG PO TABS: ORAL_TABLET | ORAL | Status: DC

## 2015-05-10 MED ORDER — LIDOCAINE HCL (PF) 1 % IJ SOLN
INTRAMUSCULAR | Status: AC
  Filled 2015-05-10: qty 5

## 2015-05-10 MED ORDER — MIDAZOLAM HCL 5 MG/5ML IJ SOLN
INTRAMUSCULAR | Status: AC
  Administered 2015-05-10: 5 mg via INTRAVENOUS
  Filled 2015-05-10: qty 5

## 2015-05-10 MED ORDER — TRIAMCINOLONE ACETONIDE 40 MG/ML IJ SUSP
INTRAMUSCULAR | Status: AC
  Administered 2015-05-10: 40 mg
  Filled 2015-05-10: qty 1

## 2015-05-10 MED ORDER — BUPIVACAINE HCL (PF) 0.25 % IJ SOLN
INTRAMUSCULAR | Status: AC
  Administered 2015-05-10: 20 mL
  Filled 2015-05-10: qty 30

## 2015-05-10 MED ORDER — FENTANYL 100 MCG/HR TD PT72: MEDICATED_PATCH | TRANSDERMAL | Status: DC

## 2015-05-10 MED ORDER — ORPHENADRINE CITRATE 30 MG/ML IJ SOLN
INTRAMUSCULAR | Status: AC
  Administered 2015-05-10: 30 mg
  Filled 2015-05-10: qty 2

## 2015-05-10 MED ORDER — CEFUROXIME AXETIL 250 MG PO TABS
250.0000 mg | ORAL_TABLET | Freq: Two times a day (BID) | ORAL | Status: DC
Start: 2015-05-10 — End: 2015-05-12

## 2015-05-10 MED ORDER — FENTANYL 75 MCG/HR TD PT72: MEDICATED_PATCH | TRANSDERMAL | Status: DC

## 2015-05-10 NOTE — Progress Notes (Signed)
Safety precautions to be maintained throughout the outpatient stay will include: orient to surroundings, keep bed in low position, maintain call bell within reach at all times, provide assistance with transfer out of bed and ambulation.  

## 2015-05-10 NOTE — Progress Notes (Signed)
Subjective:    Patient ID: Lorraine Jones, female    DOB: May 04, 1951, 64 y.o.   MRN: [id number]  HPI  PROCEDURE:  Block of nerves to the sacroiliac joint.   NOTE:  The patient is a 64 y.o. female who returns to the [facility] for further evaluation and treatment of pain involving the lower back and lower extremity region with pain in the region of the buttocks as well. Prior MRI studies reveal degenerative disc disease lumbar spine, status post prior surgical intervention lumbar region with L5-S1 scar formation, facet hypertrophy, and diffuse degenerative changes noted throughout the lumbar spine. Patient is with reproduction of pain with palpation of the PSIS and PII S regions with positive Patrick's maneuver is well    There is concern regarding a significant component of the patient's pain being due to sacroiliac joint dysfunction The risks, benefits, expectations of the procedure have been discussed and explained to the patient who is understanding and willing to proceed with interventional treatment in attempt to decrease severity of patient's symptoms, minimize the risk of medication escalation and  hopefully retard the progression of the patient's symptoms. We will proceed with what is felt to be a medically necessary procedure, block of nerves to the sacroiliac joint.   DESCRIPTION OF PROCEDURE:  Block of nerves to the sacroiliac joint.   The patient was taken to the fluoroscopy suite. With the patient in the prone position with EKG, blood pressure, pulse and pulse oximetry monitoring, IV Versed, IV fentanyl conscious sedation, Betadine prep of proposed entry site was performed.   Block of nerves at the L5 vertebral body level.   With the patient in prone position, under fluoroscopic guidance, a 22 -gauge needle was inserted at the L5 vertebral body level on the right side. With 15 degrees oblique orientation a 22 -gauge needle was inserted in the region known as Burton's eye or  eye of the Scotty dog. Following documentation of needle placement in the area of Burton's eye or eye of the Scotty dog under fluoroscopic guidance, needle placement was then accomplished at the sacral ala level on the right side.   Needle placement at the sacral ala.   With the patient in prone position under fluoroscopic guidance with AP view of the lumbosacral spine, a 22 -gauge needle was inserted in the region known as the sacral ala on the right side. Following documentation of needle placement on the right side under fluoroscopic guidance needle placement was then accomplished at the S1 foramen level.   Needle placement at the S1 foramen level.   With the patient in prone position under fluoroscopic guidance with AP view of the lumbosacral spine and cephalad orientation, a 22 -gauge needle was inserted at the superior and lateral border of the S1 foramen on the right side. Following documentation of needle placement at the S1 foramen level on the right side, needle placement was then accomplished at the S2 foramen level on the right side.   Needle placement at the S2 foramen level.   With the patient in prone position with AP view of the lumbosacral spine with cephalad orientation, a 22 - gauge needle was inserted at the superior and lateral border of the S2 foramen under fluoroscopic guidance on the right side. Following needle placement at the L5 vertebral body level, sacral ala, S1 foramen and S2 foramen on the right side, needle placement was verified on lateral view under fluoroscopic guidance.  Following needle placement documentation on lateral  view, each needle was injected with 1 mL of 0.25% bupivacaine and Kenalog.   A total of 10mg  of Kenalog was utilized for the procedure.   PLAN:  1. Medications: The patient will continue presently prescribed medications fentanyl patches and oxycodone.  2. The patient will be considered for modification of treatment regimen pending response to  the procedure performed on today's visit.  3. The patient is to follow-up with primary care physician  Dr Ancil Boozer  for evaluation of diabetes mellitus, blood pressure and general medical condition following the procedure performed on today's visit.  4. Surgical evaluation as discussed.  5. Neurological evaluation as discussed.  6. The patient may be a candidate for radiofrequency procedures, implantation devices and other treatment pending response to treatment performed on today's visit and follow-up evaluation.  7. The patient has been advised to adhere to proper body mechanics and to avoid activities which may exacerbate the patient's symptoms.   Return appointment to [facility] Management Center as scheduled.        Review of Systems     Objective:   Physical Exam        Assessment & Plan:

## 2015-05-10 NOTE — Patient Instructions (Addendum)
Continue present medications fentanyl patches and oxycodone and please began antibiotic antibiotic. Please obtain your antibiotic Ceftin today and begin taking antibiotic today  F/U PCP Dr.Sowles  for evaliation of  BP and general medical  condition.  F/U surgical evaluation as discussed  F/U neurological evaluation.  May consider radiofrequency rhizolysis or intraspinal procedures pending response to present treatment and F/U evaluation.  Patient to call [facility] should patient have concerns prior to scheduled return appointment.  Pain Management Discharge Instructions  General Discharge Instructions :  If you need to reach your doctor call: Monday-Friday 8:00 am - 4:00 pm at [phone] or toll free [phone].  After clinic hours [phone] to have operator reach doctor.  Bring all of your medication bottles to all your appointments in the pain clinic.  To cancel or reschedule your appointment with Pain Management please remember to call 24 hours in advance to avoid a fee.  Refer to the educational materials which you have been given on: General Risks, I had my Procedure. Discharge Instructions, Post Sedation.  Post Procedure Instructions:  The drugs you were given will stay in your system until tomorrow, so for the next 24 hours you should not drive, make any legal decisions or drink any alcoholic beverages.  You may eat anything you prefer, but it is better to start with liquids then soups and crackers, and gradually work up to solid foods.  Please notify your doctor immediately if you have any unusual bleeding, trouble breathing or pain that is not related to your normal pain.  Depending on the type of procedure that was done, some parts of your body may feel week and/or numb.  This usually clears up by tonight or the next day.  Walk with the use of an assistive device or accompanied by an adult for the 24 hours.  You may use ice on the affected area for  the first 24 hours.  Put ice in a Ziploc bag and cover with a towel and place against area 15 minutes on 15 minutes off.  You may switch to heat after 24 hours.GENERAL RISKS AND COMPLICATIONS  What are the risk, side effects and possible complications? Generally speaking, most procedures are safe.  However, with any procedure there are risks, side effects, and the possibility of complications.  The risks and complications are dependent upon the sites that are lesioned, or the type of nerve block to be performed.  The closer the procedure is to the spine, the more serious the risks are.  Great care is taken when placing the radio frequency needles, block needles or lesioning probes, but sometimes complications can occur. 1. Infection: Any time there is an injection through the skin, there is a risk of infection.  This is why sterile conditions are used for these blocks.  There are four possible types of infection. 1. Localized skin infection. 2. Central Nervous System Infection-This can be in the form of Meningitis, which can be deadly. 3. Epidural Infections-This can be in the form of an epidural abscess, which can cause pressure inside of the spine, causing compression of the spinal cord with subsequent paralysis. This would require an emergency surgery to decompress, and there are no guarantees that the patient would recover from the paralysis. 4. Discitis-This is an infection of the intervertebral discs.  It occurs in about 1% of discography procedures.  It is difficult to treat and it may lead to surgery.        2. Pain: the needles  have to go through skin and soft tissues, will cause soreness.       3. Damage to internal structures:  The nerves to be lesioned may be near blood vessels or    other nerves which can be potentially damaged.       4. Bleeding: Bleeding is more common if the patient is taking blood thinners such as  aspirin, Coumadin, Ticiid, Plavix, etc., or if he/she have some genetic  predisposition  such as hemophilia. Bleeding into the spinal canal can cause compression of the spinal  cord with subsequent paralysis.  This would require an emergency surgery to  decompress and there are no guarantees that the patient would recover from the  paralysis.       5. Pneumothorax:  Puncturing of a lung is a possibility, every time a needle is introduced in  the area of the chest or upper back.  Pneumothorax refers to free air around the  collapsed lung(s), inside of the thoracic cavity (chest cavity).  Another two possible  complications related to a similar event would include: Hemothorax and Chylothorax.   These are variations of the Pneumothorax, where instead of air around the collapsed  lung(s), you may have blood or chyle, respectively.       6. Spinal headaches: They may occur with any procedures in the area of the spine.       7. Persistent CSF (Cerebro-Spinal Fluid) leakage: This is a rare problem, but may occur  with prolonged intrathecal or epidural catheters either due to the formation of a fistulous  track or a dural tear.       8. Nerve damage: By working so close to the spinal cord, there is always a possibility of  nerve damage, which could be as serious as a permanent spinal cord injury with  paralysis.       9. Death:  Although rare, severe deadly allergic reactions known as "Anaphylactic  reaction" can occur to any of the medications used.      10. Worsening of the symptoms:  We can always make thing worse.  What are the chances of something like this happening? Chances of any of this occuring are extremely low.  By statistics, you have more of a chance of getting killed in a motor vehicle accident: while driving to the hospital than any of the above occurring .  Nevertheless, you should be aware that they are possibilities.  In general, it is similar to taking a shower.  Everybody knows that you can slip, hit your head and get killed.  Does that mean that you should not  shower again?  Nevertheless always keep in mind that statistics do not mean anything if you happen to be on the wrong side of them.  Even if a procedure has a 1 (one) in a 1,000,000 (million) chance of going wrong, it you happen to be that one..Also, keep in mind that by statistics, you have more of a chance of having something go wrong when taking medications.  Who should not have this procedure? If you are on a blood thinning medication (e.g. Coumadin, Plavix, see list of "Blood Thinners"), or if you have an active infection going on, you should not have the procedure.  If you are taking any blood thinners, please inform your physician.  How should I prepare for this procedure?  Do not eat or drink anything at least six hours prior to the procedure.  Bring a driver with you .  It cannot be a taxi.  Come accompanied by an adult that can drive you back, and that is strong enough to help you if your legs get weak or numb from the local anesthetic.  Take all of your medicines the morning of the procedure with just enough water to swallow them.  If you have diabetes, make sure that you are scheduled to have your procedure done first thing in the morning, whenever possible.  If you have diabetes, take only half of your insulin dose and notify our nurse that you have done so as soon as you arrive at the clinic.  If you are diabetic, but only take blood sugar pills (oral hypoglycemic), then do not take them on the morning of your procedure.  You may take them after you have had the procedure.  Do not take aspirin or any aspirin-containing medications, at least eleven (11) days prior to the procedure.  They may prolong bleeding.  Wear loose fitting clothing that may be easy to take off and that you would not mind if it got stained with Betadine or blood.  Do not wear any jewelry or perfume  Remove any nail coloring.  It will interfere with some of our monitoring equipment.  NOTE: Remember that  this is not meant to be interpreted as a complete list of all possible complications.  Unforeseen problems may occur.  BLOOD THINNERS The following drugs contain aspirin or other products, which can cause increased bleeding during surgery and should not be taken for 2 weeks prior to and 1 week after surgery.  If you should need take something for relief of minor pain, you may take acetaminophen which is found in Tylenol,m Datril, Anacin-3 and Panadol. It is not blood thinner. The products listed below are.  Do not take any of the products listed below in addition to any listed on your instruction sheet.  A.P.C or A.P.C with Codeine Codeine Phosphate Capsules #3 Ibuprofen Ridaura  ABC compound Congesprin Imuran rimadil  Advil Cope Indocin Robaxisal  Alka-Seltzer Effervescent Pain Reliever and Antacid Coricidin or Coricidin-D  Indomethacin Rufen  Alka-Seltzer plus Cold Medicine Cosprin Ketoprofen S-A-C Tablets  Anacin Analgesic Tablets or Capsules Coumadin Korlgesic Salflex  Anacin Extra Strength Analgesic tablets or capsules CP-2 Tablets Lanoril Salicylate  Anaprox Cuprimine Capsules Levenox Salocol  Anexsia-D Dalteparin Magan Salsalate  Anodynos Darvon compound Magnesium Salicylate Sine-off  Ansaid Dasin Capsules Magsal Sodium Salicylate  Anturane Depen Capsules Marnal Soma  APF Arthritis pain formula Dewitt's Pills Measurin Stanback  Argesic Dia-Gesic Meclofenamic Sulfinpyrazone  Arthritis Bayer Timed Release Aspirin Diclofenac Meclomen Sulindac  Arthritis pain formula Anacin Dicumarol Medipren Supac  Analgesic (Safety coated) Arthralgen Diffunasal Mefanamic Suprofen  Arthritis Strength Bufferin Dihydrocodeine Mepro Compound Suprol  Arthropan liquid Dopirydamole Methcarbomol with Aspirin Synalgos  ASA tablets/Enseals Disalcid Micrainin Tagament  Ascriptin Doan's Midol Talwin  Ascriptin A/D Dolene Mobidin Tanderil  Ascriptin Extra Strength Dolobid Moblgesic Ticlid  Ascriptin with Codeine  Doloprin or Doloprin with Codeine Momentum Tolectin  Asperbuf Duoprin Mono-gesic Trendar  Aspergum Duradyne Motrin or Motrin IB Triminicin  Aspirin plain, buffered or enteric coated Durasal Myochrisine Trigesic  Aspirin Suppositories Easprin Nalfon Trillsate  Aspirin with Codeine Ecotrin Regular or Extra Strength Naprosyn Uracel  Atromid-S Efficin Naproxen Ursinus  Auranofin Capsules Elmiron Neocylate Vanquish  Axotal Emagrin Norgesic Verin  Azathioprine Empirin or Empirin with Codeine Normiflo Vitamin E  Azolid Emprazil Nuprin Voltaren  Bayer Aspirin plain, buffered or children's or timed BC Tablets or powders Encaprin Orgaran Warfarin Sodium  Buff-a-Comp Enoxaparin Orudis Zorpin  Buff-a-Comp with Codeine Equegesic Os-Cal-Gesic   Buffaprin Excedrin plain, buffered or Extra Strength Oxalid   Bufferin Arthritis Strength Feldene Oxphenbutazone   Bufferin plain or Extra Strength Feldene Capsules Oxycodone with Aspirin   Bufferin with Codeine Fenoprofen Fenoprofen Pabalate or Pabalate-SF   Buffets II Flogesic Panagesic   Buffinol plain or Extra Strength Florinal or Florinal with Codeine Panwarfarin   Buf-Tabs Flurbiprofen Penicillamine   Butalbital Compound Four-way cold tablets Penicillin   Butazolidin Fragmin Pepto-Bismol   Carbenicillin Geminisyn Percodan   Carna Arthritis Reliever Geopen Persantine   Carprofen Gold's salt Persistin   Chloramphenicol Goody's Phenylbutazone   Chloromycetin Haltrain Piroxlcam   Clmetidine heparin Plaquenil   Cllnoril Hyco-pap Ponstel   Clofibrate Hydroxy chloroquine Propoxyphen         Before stopping any of these medications, be sure to consult the physician who ordered them.  Some, such as Coumadin (Warfarin) are ordered to prevent or treat serious conditions such as "deep thrombosis", "pumonary embolisms", and other heart problems.  The amount of time that you may need off of the medication may also vary with the medication and the reason for which  you were taking it.  If you are taking any of these medications, please make sure you notify your pain physician before you undergo any procedures.   Pick up antibiotic at the pharmacy. Duragesic 157mcg and 75 mcg script given Oxycodone 10mg  script given.

## 2015-05-11 ENCOUNTER — Telehealth: Payer: Self-pay

## 2015-05-11 NOTE — Telephone Encounter (Signed)
Patient states she is doing good.  

## 2015-05-12 ENCOUNTER — Ambulatory Visit (INDEPENDENT_AMBULATORY_CARE_PROVIDER_SITE_OTHER): Payer: Medicare Other | Admitting: Family Medicine

## 2015-05-12 ENCOUNTER — Encounter: Payer: Self-pay | Admitting: Family Medicine

## 2015-05-12 VITALS — BP 136/78 | HR 101 | Temp 98.0°F | Resp 16 | Ht 61.0 in | Wt 186.1 lb

## 2015-05-12 DIAGNOSIS — Z79899 Other long term (current) drug therapy: Secondary | ICD-10-CM | POA: Diagnosis not present

## 2015-05-12 DIAGNOSIS — E785 Hyperlipidemia, unspecified: Secondary | ICD-10-CM

## 2015-05-12 DIAGNOSIS — G8929 Other chronic pain: Secondary | ICD-10-CM | POA: Diagnosis not present

## 2015-05-12 DIAGNOSIS — Z23 Encounter for immunization: Secondary | ICD-10-CM

## 2015-05-12 DIAGNOSIS — E1165 Type 2 diabetes mellitus with hyperglycemia: Secondary | ICD-10-CM

## 2015-05-12 DIAGNOSIS — IMO0002 Reserved for concepts with insufficient information to code with codable children: Secondary | ICD-10-CM

## 2015-05-12 DIAGNOSIS — E1142 Type 2 diabetes mellitus with diabetic polyneuropathy: Secondary | ICD-10-CM

## 2015-05-12 DIAGNOSIS — K219 Gastro-esophageal reflux disease without esophagitis: Secondary | ICD-10-CM | POA: Diagnosis not present

## 2015-05-12 DIAGNOSIS — G990 Autonomic neuropathy in diseases classified elsewhere: Secondary | ICD-10-CM

## 2015-05-12 DIAGNOSIS — I10 Essential (primary) hypertension: Secondary | ICD-10-CM

## 2015-05-12 DIAGNOSIS — E1143 Type 2 diabetes mellitus with diabetic autonomic (poly)neuropathy: Secondary | ICD-10-CM

## 2015-05-12 LAB — POCT UA - MICROALBUMIN: Microalbumin Ur, POC: NEGATIVE mg/L

## 2015-05-12 MED ORDER — INSULIN LISPRO PROT & LISPRO (75-25 MIX) 100 UNIT/ML ~~LOC~~ SUSP: SUBCUTANEOUS | Status: DC

## 2015-05-12 MED ORDER — ZOSTER VACCINE LIVE 19400 UNT/0.65ML ~~LOC~~ SOLR: 0.6500 mL | Freq: Once | SUBCUTANEOUS | Status: DC

## 2015-05-12 MED ORDER — CARVEDILOL 3.125 MG PO TABS: 3.1250 mg | ORAL_TABLET | Freq: Two times a day (BID) | ORAL | Status: DC

## 2015-05-12 MED ORDER — PRAVASTATIN SODIUM 40 MG PO TABS: 40.0000 mg | ORAL_TABLET | Freq: Every day | ORAL | Status: DC

## 2015-05-12 MED ORDER — PIOGLITAZONE HCL 15 MG PO TABS: 15.0000 mg | ORAL_TABLET | Freq: Every day | ORAL | Status: DC

## 2015-05-12 MED ORDER — METFORMIN HCL 1000 MG PO TABS: 1000.0000 mg | ORAL_TABLET | Freq: Two times a day (BID) | ORAL | Status: DC

## 2015-05-12 MED ORDER — OLMESARTAN-AMLODIPINE-HCTZ 40-10-25 MG PO TABS: 1.0000 | ORAL_TABLET | Freq: Every day | ORAL | Status: DC

## 2015-05-12 NOTE — Progress Notes (Signed)
Name: Lorraine Jones   MRN: [id number]    DOB: 1951-03-15   Date:05/12/2015       Progress Note  Subjective  Chief Complaint  Chief Complaint  Patient presents with  . Diabetes    1 month f/u checking BG 2x day low-190, high-473    HPI  DMII with neuropathy: she is now on Pioglitazone, Metformin and Humalog 75/25, she brought the sugar log and average is 250's post-prandially, no fasting results, it has spiked to 473 once and 300's a couple of times over the past month.  She states polydipsia, polyphagia and polyuria have improved.  She is still getting steroid injections from the pain clinic.  She is taking statins and aspirin daily also on ARB. She states her neuropathy is under control at this time  HTN: she needs refill of Tribenzor, she is compliant with medication and bp is at goal today  GERD: she has been off Omeprazole and states symptoms have been controlled  Chronic Pain: she sees Dr. Primus Bravo at least once a month, taking pain medication as prescribed, she has constipation secondary to narcotics and is taking Miralax to control symptoms   Hyperlipidemia: due for labs, taking Pravastatin and denies side effects  Patient Active Problem List   Diagnosis Date Noted  . Gastroesophageal reflux disease without esophagitis 05/12/2015  . Chronic pain 05/12/2015  . Hyperlipidemia 05/12/2015  . DDD (degenerative disc disease), lumbar 02/27/2015  . Lumbar post-laminectomy syndrome 02/27/2015  . Neuropathy due to secondary diabetes 02/27/2015  . Spinal stenosis, lumbar region, with neurogenic claudication 02/27/2015  . Sacroiliac joint dysfunction of both sides 02/27/2015  . Degenerative joint disease (DJD) of hip 02/27/2015  . DDD (degenerative disc disease), cervical 02/27/2015  . Bilateral occipital neuralgia 02/27/2015  . Chronic constipation 06/02/2014  . Hyponatremia 06/01/2014  . Hypertension, benign 06/01/2014  . Uncontrolled type 2 diabetes with peripheral autonomic  neuropathy 06/01/2014    Past Surgical History  Procedure Laterality Date  . Cholecystectomy  1996  . Lumbar disc surgery  1993    L4  --  L5  . Cystoscopy with ureteroscopy Right 09/01/2013    Procedure: CYSTOSCOPY WITH UNROOFING  RIGHT URETEROCELE AND URETERAL STONE REMOVED  WITH GYRUS COLLINS KNIFE. ;  Surgeon: Irine Seal, MD;  Location: [facility];  Service: Urology;  Laterality: Right;  cysto, right uretersoscopy and stone extraction   collins knife    Family History  Problem Relation Age of Onset  . Diabetes Mother   . Heart disease Mother   . Hypertension Mother     Social History   Social History  . Marital Status: Married    Spouse Name: N/A  . Number of Children: N/A  . Years of Education: N/A   Occupational History  . Not on file.   Social History Main Topics  . Smoking status: Current Every Day Smoker -- 0.50 packs/day for 12 years    Types: Cigarettes  . Smokeless tobacco: Never Used  . Alcohol Use: No  . Drug Use: No  . Sexual Activity: Yes   Other Topics Concern  . Not on file   Social History Narrative     Current outpatient prescriptions:  .  aspirin EC 81 MG tablet, Take 81 mg by mouth daily., Disp: , Rfl:  .  carvedilol (COREG) 3.125 MG tablet, Take 3.125 mg by mouth 2 (two) times daily with a meal., Disp: , Rfl:  .  dorzolamide-timolol (COSOPT) 22.3-6.8 MG/ML ophthalmic solution, Place 1  drop into both eyes 2 (two) times daily., Disp: , Rfl:  .  fentaNYL (DURAGESIC - DOSED MCG/HR) 100 MCG/HR, Apply  1 patch to skin every 2 days if tolerated. The patch will be applied with a 75 g per hour fentanyl patch, Disp: 15 patch, Rfl: 0 .  fentaNYL (DURAGESIC - DOSED MCG/HR) 75 MCG/HR, Apply 1 patch to skin every 2 days if tolerated. The patch will be applied with a 100 g per hour fentanyl patch, Disp: 15 patch, Rfl: Seroquel .  insulin lispro protamine-lispro (HUMALOG MIX 75/25) (75-25) 100 UNIT/ML SUSP injection, 35 units before  breakfast and 25 units before dinner, Disp: 10 mL, Rfl: 1 .  metFORMIN (GLUCOPHAGE) 1000 MG tablet, Take 1 tablet (1,000 mg total) by mouth 2 (two) times daily with a meal., Disp: 60 tablet, Rfl: 6 .  Multiple Vitamins-Minerals (MULTIVITAMIN WITH MINERALS) tablet, Take 1 tablet by mouth daily., Disp: , Rfl:  .  Olmesartan-Amlodipine-HCTZ (TRIBENZOR) 40-10-25 MG TABS, Take 1 tablet by mouth daily., Disp: 30 tablet, Rfl: 6 .  Oxycodone HCl 10 MG TABS, Limit 1 tab by mouth 4-6 times per day if tolerated, Disp: 100 tablet, Rfl: 0 .  pioglitazone (ACTOS) 15 MG tablet, Take 1 tablet (15 mg total) by mouth daily., Disp: 30 tablet, Rfl: 5 .  polyethylene glycol (MIRALAX / GLYCOLAX) packet, Take 17 g by mouth daily., Disp: 14 each, Rfl: 0 .  pravastatin (PRAVACHOL) 40 MG tablet, Take 40 mg by mouth daily., Disp: , Rfl:  .  zoster vaccine live, PF, (ZOSTAVAX) 68341 UNT/0.65ML injection, Inject 19,400 Units into the skin once., Disp: 1 each, Rfl: 0  Allergies  Allergen Reactions  . Diclofenac Sodium Swelling  . Lodine [Etodolac] Swelling  . Naproxen Sodium Swelling  . Neurontin [Gabapentin] Swelling     ROS  Constitutional: Negative for fever or weight change.  Respiratory: Negative for cough and shortness of breath.   Cardiovascular: Negative for chest pain or palpitations.  Gastrointestinal: Negative for abdominal pain, no bowel changes.  Musculoskeletal: Positive  for gait problem - uses a cane  or joint swelling.  Skin: Negative for rash.  Neurological: Negative for dizziness or headache.  No other specific complaints in a complete review of systems (except as listed in HPI above).  Objective  Filed Vitals:   05/12/15 0910  BP: 136/78  Pulse: 101  Temp: 98 F (36.7 C)  TempSrc: Oral  Resp: 16  Height: 5\' 1"  (1.549 m)  Weight: 186 lb 1.6 oz (84.414 kg)  SpO2: 95%    Body mass index is 35.18 kg/(m^2).  Physical Exam  Constitutional: Patient appears well-developed and  well-nourished. Obese  No distress.  HEENT: head atraumatic, normocephalic, pupils equal and reactive to light, ears neck supple, throat within normal limits Cardiovascular: Normal rate, regular rhythm and normal heart sounds.  No murmur heard. No BLE edema. Pulmonary/Chest: Effort normal and breath sounds normal. No respiratory distress. Abdominal: Soft.  There is no tenderness. Psychiatric: Patient has a normal mood and affect. behavior is normal. Judgment and thought content normal.  Recent Results (from the past 2160 hour(s))  POCT UA - Microalbumin     Status: Normal   Collection Time: 05/12/15  9:18 AM  Result Value Ref Range   Microalbumin Ur, POC neg mg/L   Creatinine, POC  mg/dL   Albumin/Creatinine Ratio, Urine, POC       PHQ2/9: Depression screen [facility] 2/9 04/27/2015 03/30/2015 03/01/2015  Decreased Interest 0 0 1  Down, Depressed, Hopeless 0  0 1  PHQ - 2 Score 0 0 2  Altered sleeping - - 1  Tired, decreased energy - - 1  Change in appetite - - 1  Feeling bad or failure about yourself  - - 0  Trouble concentrating - - 0  Moving slowly or fidgety/restless - - 0  Suicidal thoughts - - 0  PHQ-9 Score - - 5  Difficult doing work/chores - - Not difficult at all    Fall Risk: Fall Risk  05/10/2015 04/27/2015 03/30/2015 03/21/2015 03/01/2015  Falls in the past year? No No No No No      Assessment & Plan  1. Uncontrolled type 2 diabetes with peripheral autonomic neuropathy Seems to be improving, we will increase dose of insulin to 35 in am and 25 in pm, discussed diet, eat more protein and less carbohydrates, check fasting glucose at least a couple times weekly, continue post-prandial monitoring - POCT UA - Microalbumin - pioglitazone (ACTOS) 15 MG tablet; Take 1 tablet (15 mg total) by mouth daily.  Dispense: 90 tablet; Refill: 1 - metFORMIN (GLUCOPHAGE) 1000 MG tablet; Take 1 tablet (1,000 mg total) by mouth 2 (two) times daily with a meal.  Dispense: 180 tablet; Refill: 1 -  insulin lispro protamine-lispro (HUMALOG MIX 75/25) (75-25) 100 UNIT/ML SUSP injection; 35 units before breakfast and 25 units before dinner  Dispense: 30 mL; Refill: 1  2. Hypertension, benign At goal, continue medication  - Comprehensive metabolic panel - carvedilol (COREG) 3.125 MG tablet; Take 1 tablet (3.125 mg total) by mouth 2 (two) times daily with a meal.  Dispense: 180 tablet; Refill: 1 - Olmesartan-Amlodipine-HCTZ (TRIBENZOR) 40-10-25 MG TABS; Take 1 tablet by mouth daily.  Dispense: 90 tablet; Refill: 1  3. Gastroesophageal reflux disease without esophagitis Doing well at this time, off medication   4. Chronic pain Continue follow up with Dr. Primus Bravo  5. High risk medication use  - CBC with Differential/Platelet - Vitamin B12  6. Hyperlipidemia  - Lipid panel - pravastatin (PRAVACHOL) 40 MG tablet; Take 1 tablet (40 mg total) by mouth daily.  Dispense: 90 tablet; Refill: 1  7. Need for shingles vaccine  - zoster vaccine live, PF, (ZOSTAVAX) 68127 UNT/0.65ML injection; Inject 19,400 Units into the skin once.  Dispense: 1 each; Refill: 0

## 2015-05-13 LAB — CBC WITH DIFFERENTIAL/PLATELET
Basophils Absolute: 0.1 10*3/uL (ref 0.0–0.2)
Basos: 1 %
EOS (ABSOLUTE): 0.1 10*3/uL (ref 0.0–0.4)
EOS: 1 %
Hematocrit: 44.2 % (ref 34.0–46.6)
Hemoglobin: 14.9 g/dL (ref 11.1–15.9)
Immature Grans (Abs): 0 10*3/uL (ref 0.0–0.1)
Immature Granulocytes: 0 %
Lymphocytes Absolute: 3.1 10*3/uL (ref 0.7–3.1)
Lymphs: 31 %
MCH: 31 pg (ref 26.6–33.0)
MCHC: 33.7 g/dL (ref 31.5–35.7)
MCV: 92 fL (ref 79–97)
Monocytes Absolute: 0.4 10*3/uL (ref 0.1–0.9)
Monocytes: 4 %
Neutrophils Absolute: 6.3 10*3/uL (ref 1.4–7.0)
Neutrophils: 63 %
Platelets: 302 10*3/uL (ref 150–379)
RBC: 4.81 x10E6/uL (ref 3.77–5.28)
RDW: 13.3 % (ref 12.3–15.4)
WBC: 10 10*3/uL (ref 3.4–10.8)

## 2015-05-13 LAB — LIPID PANEL
Chol/HDL Ratio: 3 ratio units (ref 0.0–4.4)
Cholesterol, Total: 215 mg/dL — ABNORMAL HIGH (ref 100–199)
HDL: 72 mg/dL (ref >39)
LDL CALC: 103 mg/dL — AB (ref 0–99)
TRIGLYCERIDES: 202 mg/dL — AB (ref 0–149)
VLDL Cholesterol Cal: 40 mg/dL (ref 5–40)

## 2015-05-13 LAB — COMPREHENSIVE METABOLIC PANEL
A/G RATIO: 1.9 (ref 1.1–2.5)
ALK PHOS: 60 IU/L (ref 39–117)
ALT: 12 IU/L (ref 0–32)
AST: 11 IU/L (ref 0–40)
Albumin: 4.6 g/dL (ref 3.6–4.8)
BUN/Creatinine Ratio: 14 (ref 11–26)
BUN: 14 mg/dL (ref 8–27)
CALCIUM: 10.1 mg/dL (ref 8.7–10.3)
CHLORIDE: 89 mmol/L — AB (ref 97–108)
CO2: 23 mmol/L (ref 18–29)
CREATININE: 0.97 mg/dL (ref 0.57–1.00)
GFR calc Af Amer: 71 mL/min/{1.73_m2} (ref >59)
GFR calc non Af Amer: 62 mL/min/{1.73_m2} (ref >59)
GLUCOSE: 367 mg/dL — AB (ref 65–99)
Globulin, Total: 2.4 g/dL (ref 1.5–4.5)
Potassium: 4.9 mmol/L (ref 3.5–5.2)
Sodium: 131 mmol/L — ABNORMAL LOW (ref 134–144)
Total Protein: 7 g/dL (ref 6.0–8.5)

## 2015-05-13 LAB — VITAMIN B12: Vitamin B-12: 406 pg/mL (ref 211–946)

## 2015-05-15 ENCOUNTER — Telehealth: Payer: Self-pay

## 2015-05-15 ENCOUNTER — Other Ambulatory Visit: Payer: Self-pay | Admitting: Pain Medicine

## 2015-05-15 NOTE — Telephone Encounter (Signed)
Left patient voicemail.

## 2015-05-30 ENCOUNTER — Ambulatory Visit: Payer: Medicare Other | Attending: Pain Medicine | Admitting: Pain Medicine

## 2015-05-30 ENCOUNTER — Encounter: Payer: Self-pay | Admitting: Pain Medicine

## 2015-05-30 VITALS — BP 148/74 | HR 93 | Temp 98.2°F | Resp 18 | Ht 61.0 in | Wt 186.0 lb

## 2015-05-30 DIAGNOSIS — M160 Bilateral primary osteoarthritis of hip: Secondary | ICD-10-CM

## 2015-05-30 DIAGNOSIS — M1610 Unilateral primary osteoarthritis, unspecified hip: Secondary | ICD-10-CM | POA: Diagnosis not present

## 2015-05-30 DIAGNOSIS — M79609 Pain in unspecified limb: Secondary | ICD-10-CM | POA: Diagnosis not present

## 2015-05-30 DIAGNOSIS — M5136 Other intervertebral disc degeneration, lumbar region: Secondary | ICD-10-CM

## 2015-05-30 DIAGNOSIS — E1165 Type 2 diabetes mellitus with hyperglycemia: Secondary | ICD-10-CM

## 2015-05-30 DIAGNOSIS — IMO0002 Reserved for concepts with insufficient information to code with codable children: Secondary | ICD-10-CM

## 2015-05-30 DIAGNOSIS — M79605 Pain in left leg: Secondary | ICD-10-CM | POA: Diagnosis present

## 2015-05-30 DIAGNOSIS — M5030 Other cervical disc degeneration, unspecified cervical region: Secondary | ICD-10-CM

## 2015-05-30 DIAGNOSIS — E1340 Other specified diabetes mellitus with diabetic neuropathy, unspecified: Secondary | ICD-10-CM

## 2015-05-30 DIAGNOSIS — M961 Postlaminectomy syndrome, not elsewhere classified: Secondary | ICD-10-CM

## 2015-05-30 DIAGNOSIS — E1140 Type 2 diabetes mellitus with diabetic neuropathy, unspecified: Secondary | ICD-10-CM | POA: Diagnosis not present

## 2015-05-30 DIAGNOSIS — M5416 Radiculopathy, lumbar region: Secondary | ICD-10-CM | POA: Diagnosis not present

## 2015-05-30 DIAGNOSIS — M47817 Spondylosis without myelopathy or radiculopathy, lumbosacral region: Secondary | ICD-10-CM | POA: Diagnosis not present

## 2015-05-30 DIAGNOSIS — M48062 Spinal stenosis, lumbar region with neurogenic claudication: Secondary | ICD-10-CM

## 2015-05-30 DIAGNOSIS — M5481 Occipital neuralgia: Secondary | ICD-10-CM

## 2015-05-30 DIAGNOSIS — M545 Low back pain: Secondary | ICD-10-CM | POA: Diagnosis present

## 2015-05-30 DIAGNOSIS — M533 Sacrococcygeal disorders, not elsewhere classified: Secondary | ICD-10-CM | POA: Diagnosis not present

## 2015-05-30 DIAGNOSIS — E1143 Type 2 diabetes mellitus with diabetic autonomic (poly)neuropathy: Secondary | ICD-10-CM

## 2015-05-30 DIAGNOSIS — M79604 Pain in right leg: Secondary | ICD-10-CM | POA: Diagnosis present

## 2015-05-30 MED ORDER — FENTANYL 100 MCG/HR TD PT72: MEDICATED_PATCH | TRANSDERMAL | Status: DC

## 2015-05-30 MED ORDER — FENTANYL 75 MCG/HR TD PT72: MEDICATED_PATCH | TRANSDERMAL | Status: DC

## 2015-05-30 MED ORDER — OXYCODONE HCL 10 MG PO TABS: ORAL_TABLET | ORAL | Status: DC

## 2015-05-30 NOTE — Progress Notes (Signed)
Safety precautions to be maintained throughout the outpatient stay will include: orient to surroundings, keep bed in low position, maintain call bell within reach at all times, provide assistance with transfer out of bed and ambulation.  

## 2015-05-30 NOTE — Progress Notes (Signed)
   Subjective:    Patient ID: Lorraine Jones, female    DOB: 11-Jul-1951, 64 y.o.   MRN: [id number]  HPI  Patient is 63 year old female returns to [facility] for further evaluation and treatment of pain involving the lower back and lower extremity region predominantly. Patient states that she had tremendous relief of pain following block of nerves to the sacroiliac joint. At the present time patient states she is doing remarkably well. Patient is able to enjoy activities of daily living without significant pain interfering with all activities of daily living. Patient denies trauma change in events of daily living the cause change in symptomatology. We will continue present medications of oxycodone and fentanyl patches. Patient is call Pain Management Center should there be significant change in condition prior to scheduled return appointment. The patient is understanding and agrees suggested treatment plan.      Review of Systems     Objective:   Physical Exam   there was mild tenderness of the splenius capitis and occipitalis musculature. Patient agrees regions reproduced mild discomfort. There was mild tenderness of the acromioclavicular glenohumeral joint regions. Palpation of the thoracic facet thoracic paraspinal muscles region was with mild tends to palpation. There was mild to palpation of the cervical facet cervical paraspinal musculature region as well. Patient appeared to be with bilaterally equal grip strength. Tinel and Phalen's maneuver were without increase of pain of significant 8. There was no crepitus of the thoracic region noted. Palpation over the lumbar paraspinal musculature region lumbar facet region with tinged palpation of mild degree. Lateral bending and rotation and extension and palpation of the lumbar facets reproduced mild discomfort. There was mild tenderness of PSIS PI is regions as well as the gluteal and piriformis musculature. There was mild tenderness  along the greater trochanteric region iliotibial band region. Straight leg raising tolerates approximately 20 without increased pain with dorsiflexion noted. There was no definite sensory deficits detected. Reevaluation was questionable decreased sensation along the L5 dermatomal distribution. DTRs difficult to elicit patient had difficulty relaxing. There was negative clonus negative Homans. Abdomen was nontender with no costovertebral tenderness noted.Degenerative disc disease lumbar spine Status post prior surgical intervention lumbar region with L5-S1 scar formation, facet hypertrophy, and diffuse degenerative changes noted throughout the lumbar spine  Lumbar facet syndrome  Lumbar radiculopathy  Sacroiliac joint disease  Degenerative joint disease of hip  Diabetic neuropathy   PLAN   Continue present medications oxycodone and fentanyl patches  F/U PCP Dr. Lucita Lora for evaliation of  BP and general medical  condition  F/U surgical evaluation to be considered as discussed   F/U neurological evaluation. Will facet avoid at this time  May radiofrequency rhizolysis or intraspinal procedures pending response to present treatment and F/U evaluation   Patient to call Pain Management Center should patient have concerns prior to scheduled return appointment.       Assessment & Plan:

## 2015-05-30 NOTE — Patient Instructions (Addendum)
Continue present medications oxycodone and fentanyl patches  F/U PCP Dr. Lucita Lora for evaliation of  BP and general medical  condition  F/U surgical evaluation  F/U neurological evaluation  May consider radiofrequency rhizolysis or intraspinal procedures pending response to present treatment and F/U evaluation   Patient to call Pain Management Center should patient have concerns prior to scheduled return appointment.

## 2015-06-12 ENCOUNTER — Telehealth: Payer: Self-pay | Admitting: Family Medicine

## 2015-06-12 NOTE — Telephone Encounter (Signed)
Ask her to come in with all her reading logs and we will discuss adjusting or changing dose

## 2015-06-12 NOTE — Telephone Encounter (Signed)
Please advise 

## 2015-06-12 NOTE — Telephone Encounter (Signed)
Pt wants to know if she can change her insulin back to the Lantus. She states the insulin she is on is not working and she is still getting high readings.

## 2015-06-12 NOTE — Telephone Encounter (Signed)
Patient notified and transferred up front to make a appointment to be seen.

## 2015-06-16 ENCOUNTER — Other Ambulatory Visit: Payer: Self-pay | Admitting: Family Medicine

## 2015-06-16 NOTE — Telephone Encounter (Signed)
Patient requesting refill. 

## 2015-06-19 ENCOUNTER — Ambulatory Visit (INDEPENDENT_AMBULATORY_CARE_PROVIDER_SITE_OTHER): Payer: Medicare Other | Admitting: Family Medicine

## 2015-06-19 ENCOUNTER — Encounter: Payer: Self-pay | Admitting: Family Medicine

## 2015-06-19 VITALS — BP 134/68 | HR 91 | Temp 98.6°F | Resp 16 | Ht 61.0 in | Wt 195.3 lb

## 2015-06-19 DIAGNOSIS — E118 Type 2 diabetes mellitus with unspecified complications: Secondary | ICD-10-CM | POA: Diagnosis not present

## 2015-06-19 DIAGNOSIS — Z23 Encounter for immunization: Secondary | ICD-10-CM

## 2015-06-19 DIAGNOSIS — E1340 Other specified diabetes mellitus with diabetic neuropathy, unspecified: Secondary | ICD-10-CM

## 2015-06-19 DIAGNOSIS — IMO0002 Reserved for concepts with insufficient information to code with codable children: Secondary | ICD-10-CM

## 2015-06-19 DIAGNOSIS — E1165 Type 2 diabetes mellitus with hyperglycemia: Secondary | ICD-10-CM

## 2015-06-19 LAB — POCT GLYCOSYLATED HEMOGLOBIN (HGB A1C): Hemoglobin A1C: 10.5

## 2015-06-19 LAB — POCT UA - MICROALBUMIN: MICROALBUMIN (UR) POC: NEGATIVE mg/L

## 2015-06-19 MED ORDER — INSULIN GLARGINE 100 UNIT/ML SOLOSTAR PEN: 50.0000 [IU] | PEN_INJECTOR | Freq: Every day | SUBCUTANEOUS | Status: DC

## 2015-06-19 MED ORDER — INSULIN ASPART 100 UNIT/ML FLEXPEN: 20.0000 [IU] | PEN_INJECTOR | Freq: Three times a day (TID) | SUBCUTANEOUS | Status: DC

## 2015-06-19 NOTE — Progress Notes (Signed)
Name: Lorraine Jones   MRN: [id number]    DOB: Sep 24, 1951   Date:06/19/2015       Progress Note  Subjective  Chief Complaint  Chief Complaint  Patient presents with  . Medication Refill    follow-up  . Diabetes    checks BG bid High-566, Low-345 pt is concerned about elevated readings.  she was changed from lantus to hemalog    HPI  DMII insulin requiring: she is on Metformin, Actos and Humalog Mix 75/25 but glucose is getting higher instead of lower, hgbA1C has gone from 9.9% to 10.5%, her glucose at home has been very high. She states she has been compliant with medication. She wants to go back to previous regiment of Lantus and Novolog. States husband has an income now and they should be able to afford medication.  She denies polyphagia, but always drinking a lot of water and has polydipsia. She denies blurred vision. Taking aspirin , ACE and statins as prescribed.  She has diabetic neuropathy and states pain on her feet is under control with medication ( given by Dr. Primus Bravo for chronic back pain). She is not physically active and she states she tries to be compliant with diet but a few times weekly she eats candy or dessert. She also eats fried chicken three times weekly, also has pasta, bread with lunch   Patient Active Problem List   Diagnosis Date Noted  . Gastroesophageal reflux disease without esophagitis 05/12/2015  . Chronic pain 05/12/2015  . Hyperlipidemia 05/12/2015  . DDD (degenerative disc disease), lumbar 02/27/2015  . Lumbar post-laminectomy syndrome 02/27/2015  . Neuropathy due to secondary diabetes 02/27/2015  . Spinal stenosis, lumbar region, with neurogenic claudication 02/27/2015  . Sacroiliac joint dysfunction of both sides 02/27/2015  . Degenerative joint disease (DJD) of hip 02/27/2015  . DDD (degenerative disc disease), cervical 02/27/2015  . Bilateral occipital neuralgia 02/27/2015  . Chronic constipation 06/02/2014  . Hyponatremia 06/01/2014  .  Hypertension, benign 06/01/2014    Past Surgical History  Procedure Laterality Date  . Cholecystectomy  1996  . Lumbar disc surgery  1993    L4  --  L5  . Cystoscopy with ureteroscopy Right 09/01/2013    Procedure: CYSTOSCOPY WITH UNROOFING  RIGHT URETEROCELE AND URETERAL STONE REMOVED  WITH GYRUS COLLINS KNIFE. ;  Surgeon: Irine Seal, MD;  Location: [facility];  Service: Urology;  Laterality: Right;  cysto, right uretersoscopy and stone extraction   collins knife    Family History  Problem Relation Age of Onset  . Diabetes Mother   . Heart disease Mother   . Hypertension Mother     Social History   Social History  . Marital Status: Married    Spouse Name: N/A  . Number of Children: N/A  . Years of Education: N/A   Occupational History  . Not on file.   Social History Main Topics  . Smoking status: Current Every Day Smoker -- 0.50 packs/day for 12 years    Types: Cigarettes  . Smokeless tobacco: Never Used  . Alcohol Use: No  . Drug Use: No  . Sexual Activity: Yes   Other Topics Concern  . Not on file   Social History Narrative     Current outpatient prescriptions:  .  aspirin EC 81 MG tablet, Take 81 mg by mouth daily., Disp: , Rfl:  .  carvedilol (COREG) 3.125 MG tablet, Take 1 tablet (3.125 mg total) by mouth 2 (two) times daily with a  meal., Disp: 180 tablet, Rfl: 1 .  dorzolamide-timolol (COSOPT) 22.3-6.8 MG/ML ophthalmic solution, Place 1 drop into both eyes 2 (two) times daily., Disp: , Rfl:  .  fentaNYL (DURAGESIC - DOSED MCG/HR) 100 MCG/HR, Apply  1 patch to skin every 2 days if tolerated. The patch will be applied with a 75 g per hour fentanyl patch, Disp: 15 patch, Rfl: 0 .  fentaNYL (DURAGESIC - DOSED MCG/HR) 75 MCG/HR, Apply 1 patch to skin every 2 days if tolerated. The patch will be applied with a 100 g per hour fentanyl patch, Disp: 15 patch, Rfl: Seroquel .  insulin aspart (NOVOLOG FLEXPEN) 100 UNIT/ML FlexPen, Inject 20 Units  into the skin 3 (three) times daily with meals., Disp: 15 mL, Rfl: 1 .  Insulin Glargine (LANTUS) 100 UNIT/ML Solostar Pen, Inject 50 Units into the skin daily at 10 pm., Disp: 15 mL, Rfl: 1 .  metFORMIN (GLUCOPHAGE) 1000 MG tablet, Take 1 tablet (1,000 mg total) by mouth 2 (two) times daily with a meal., Disp: 180 tablet, Rfl: 1 .  metoCLOPramide (REGLAN) 5 MG tablet, TAKE 1 TABLET BY MOUTH 3 TIMES A DAY, Disp: 90 tablet, Rfl: 3 .  Multiple Vitamins-Minerals (MULTIVITAMIN WITH MINERALS) tablet, Take 1 tablet by mouth daily., Disp: , Rfl:  .  Olmesartan-Amlodipine-HCTZ (TRIBENZOR) 40-10-25 MG TABS, Take 1 tablet by mouth daily., Disp: 90 tablet, Rfl: 1 .  Oxycodone HCl 10 MG TABS, Limit 1 tab by mouth 4-6 times per day if tolerated, Disp: 100 tablet, Rfl: 0 .  pioglitazone (ACTOS) 15 MG tablet, Take 1 tablet (15 mg total) by mouth daily., Disp: 90 tablet, Rfl: 1 .  polyethylene glycol (MIRALAX / GLYCOLAX) packet, Take 17 g by mouth daily., Disp: 14 each, Rfl: 0 .  pravastatin (PRAVACHOL) 40 MG tablet, Take 1 tablet (40 mg total) by mouth daily., Disp: 90 tablet, Rfl: 1  Allergies  Allergen Reactions  . Diclofenac Sodium Swelling  . Lodine [Etodolac] Swelling  . Naproxen Sodium Swelling  . Neurontin [Gabapentin] Swelling     ROS  Constitutional: Negative for fever , positive for  weight change - gain.  Respiratory: Negative for cough and shortness of breath.   Cardiovascular: Negative for chest pain or palpitations.  Gastrointestinal: Negative for abdominal pain, no bowel changes.  Musculoskeletal: positive  for gait problem - uses a cane - or joint swelling.  Skin: Negative for rash.  Neurological: Negative for dizziness or headache.  No other specific complaints in a complete review of systems (except as listed in HPI above).  Objective  Filed Vitals:   06/19/15 1211  BP: 134/68  Pulse: 91  Temp: 98.6 F (37 C)  TempSrc: Oral  Resp: 16  Height: 5\' 1"  (1.549 m)  Weight: 195  lb 4.8 oz (88.587 kg)  SpO2: 96%    Body mass index is 36.92 kg/(m^2).  Physical Exam  Constitutional: Patient appears well-developed and well-nourished. Obese No distress.  HEENT: head atraumatic, normocephalic, pupils equal and reactive to light,  neck supple, throat within normal limits Cardiovascular: Normal rate, regular rhythm and normal heart sounds.  No murmur heard. No BLE edema. Pulmonary/Chest: Effort normal and breath sounds normal. No respiratory distress. Abdominal: Soft.  There is no tenderness. Psychiatric: Patient has a normal mood and affect. behavior is normal. Judgment and thought content normal.  Recent Results (from the past 2160 hour(s))  POCT UA - Microalbumin     Status: Normal   Collection Time: 05/12/15  9:18 AM  Result Value  Ref Range   Microalbumin Ur, POC neg mg/L   Creatinine, POC  mg/dL   Albumin/Creatinine Ratio, Urine, POC    Lipid panel     Status: Abnormal   Collection Time: 05/12/15 10:10 AM  Result Value Ref Range   Cholesterol, Total 215 (H) 100 - 199 mg/dL   Triglycerides 202 (H) 0 - 149 mg/dL   HDL 72 >39 mg/dL    Comment: According to ATP-III Guidelines, HDL-C >59 mg/dL is considered a negative risk factor for CHD.    VLDL Cholesterol Cal 40 5 - 40 mg/dL   LDL Calculated 103 (H) 0 - 99 mg/dL   Chol/HDL Ratio 3.0 0.0 - 4.4 ratio units    Comment:                                   T. Chol/HDL Ratio                                             Men  Women                               1/2 Avg.Risk  3.4    3.3                                   Avg.Risk  5.0    4.4                                2X Avg.Risk  9.6    7.1                                3X Avg.Risk 23.4   11.0   Comprehensive metabolic panel     Status: Abnormal   Collection Time: 05/12/15 10:10 AM  Result Value Ref Range   Glucose 367 (H) 65 - 99 mg/dL   BUN 14 8 - 27 mg/dL   Creatinine, Ser 0.97 0.57 - 1.00 mg/dL   GFR calc non Af Amer 62 >59 mL/min/1.73   GFR calc Af  Amer 71 >59 mL/min/1.73   BUN/Creatinine Ratio 14 11 - 26   Sodium 131 (L) 134 - 144 mmol/L   Potassium 4.9 3.5 - 5.2 mmol/L   Chloride 89 (L) 97 - 108 mmol/L   CO2 23 18 - 29 mmol/L   Calcium 10.1 8.7 - 10.3 mg/dL   Total Protein 7.0 6.0 - 8.5 g/dL   Albumin 4.6 3.6 - 4.8 g/dL   Globulin, Total 2.4 1.5 - 4.5 g/dL   Albumin/Globulin Ratio 1.9 1.1 - 2.5   Bilirubin Total <0.2 0.0 - 1.2 mg/dL   Alkaline Phosphatase 60 39 - 117 IU/L   AST 11 0 - 40 IU/L   ALT 12 0 - 32 IU/L  CBC with Differential/Platelet     Status: None   Collection Time: 05/12/15 10:10 AM  Result Value Ref Range   WBC 10.0 3.4 - 10.8 x10E3/uL   RBC 4.81 3.77 - 5.28 x10E6/uL   Hemoglobin 14.9 11.1 - 15.9 g/dL   Hematocrit 44.2 34.0 -  46.6 %   MCV 92 79 - 97 fL   MCH 31.0 26.6 - 33.0 pg   MCHC 33.7 31.5 - 35.7 g/dL   RDW 13.3 12.3 - 15.4 %   Platelets 302 150 - 379 x10E3/uL   Neutrophils 63 %   Lymphs 31 %   Monocytes 4 %   Eos 1 %   Basos 1 %   Neutrophils Absolute 6.3 1.4 - 7.0 x10E3/uL   Lymphocytes Absolute 3.1 0.7 - 3.1 x10E3/uL   Monocytes Absolute 0.4 0.1 - 0.9 x10E3/uL   EOS (ABSOLUTE) 0.1 0.0 - 0.4 x10E3/uL   Basophils Absolute 0.1 0.0 - 0.2 x10E3/uL   Immature Granulocytes 0 %   Immature Grans (Abs) 0.0 0.0 - 0.1 x10E3/uL  Vitamin B12     Status: None   Collection Time: 05/12/15 10:10 AM  Result Value Ref Range   Vitamin B-12 406 211 - 946 pg/mL  POCT UA - Microalbumin     Status: Normal   Collection Time: 06/19/15 12:10 PM  Result Value Ref Range   Microalbumin Ur, POC negative mg/L   Creatinine, POC  mg/dL   Albumin/Creatinine Ratio, Urine, POC    POCT HgB A1C     Status: None   Collection Time: 06/19/15 12:12 PM  Result Value Ref Range   Hemoglobin A1C 10.5      PHQ2/9: Depression screen [facility] 2/9 05/30/2015 04/27/2015 03/30/2015 03/01/2015  Decreased Interest 0 0 0 1  Down, Depressed, Hopeless 0 0 0 1  PHQ - 2 Score 0 0 0 2  Altered sleeping - - - 1  Tired, decreased energy - - - 1   Change in appetite - - - 1  Feeling bad or failure about yourself  - - - 0  Trouble concentrating - - - 0  Moving slowly or fidgety/restless - - - 0  Suicidal thoughts - - - 0  PHQ-9 Score - - - 5  Difficult doing work/chores - - - Not difficult at all     Fall Risk: Fall Risk  05/30/2015 05/10/2015 04/27/2015 03/30/2015 03/21/2015  Falls in the past year? No No No No No      Functional Status Survey: Is the patient deaf or have difficulty hearing?: No Does the patient have difficulty seeing, even when wearing glasses/contacts?: Yes (glasses) Does the patient have difficulty concentrating, remembering, or making decisions?: No Does the patient have difficulty walking or climbing stairs?: No Does the patient have difficulty dressing or bathing?: No Does the patient have difficulty doing errands alone such as visiting a doctor's office or shopping?: No    Assessment & Plan  1. Diabetes mellitus type 2, uncontrolled, with complications Explained importance of compliance with her diet, gaining weight, having cookies and fried food, glucose is out of control, we will try going back to previous regiment, but hgbA1C has not been at goal for years, and we will refer her to Endocrinologist - patient refused to get referral made today, she states she will try her best to eat a diabetic diet - POCT UA - Microalbumin - POCT HgB A1C - Insulin Glargine (LANTUS) 100 UNIT/ML Solostar Pen; Inject 50 Units into the skin daily at 10 pm.  Dispense: 15 mL; Refill: 1 - insulin aspart (NOVOLOG FLEXPEN) 100 UNIT/ML FlexPen; Inject 20 Units into the skin 3 (three) times daily with meals.  Dispense: 15 mL; Refill: 1  2. Needs flu shot  - Flu Vaccine QUAD 36+ mos PF IM (Fluarix & Fluzone [employer]  PF)  3. Neuropathy due to secondary diabetes Doing well at this time

## 2015-06-19 NOTE — Patient Instructions (Signed)
Diabetes Mellitus and Food It is important for you to manage your blood sugar (glucose) level. Your blood glucose level can be greatly affected by what you eat. Eating healthier foods in the appropriate amounts throughout the day at about the same time each day will help you control your blood glucose level. It can also help slow or prevent worsening of your diabetes mellitus. Healthy eating may even help you improve the level of your blood pressure and reach or maintain a healthy weight.  HOW CAN FOOD AFFECT ME? Carbohydrates Carbohydrates affect your blood glucose level more than any other type of food. Your dietitian will help you determine how many carbohydrates to eat at each meal and teach you how to count carbohydrates. Counting carbohydrates is important to keep your blood glucose at a healthy level, especially if you are using insulin or taking certain medicines for diabetes mellitus. Alcohol Alcohol can cause sudden decreases in blood glucose (hypoglycemia), especially if you use insulin or take certain medicines for diabetes mellitus. Hypoglycemia can be a life-threatening condition. Symptoms of hypoglycemia (sleepiness, dizziness, and disorientation) are similar to symptoms of having too much alcohol.  If your health care provider has given you approval to drink alcohol, do so in moderation and use the following guidelines:  Women should not have more than one drink per day, and men should not have more than two drinks per day. One drink is equal to:  12 oz of beer.  5 oz of wine.  1 oz of hard liquor.  Do not drink on an empty stomach.  Keep yourself hydrated. Have water, diet soda, or unsweetened iced tea.  Regular soda, juice, and other mixers might contain a lot of carbohydrates and should be counted. WHAT FOODS ARE NOT RECOMMENDED? As you make food choices, it is important to remember that all foods are not the same. Some foods have fewer nutrients per serving than other  foods, even though they might have the same number of calories or carbohydrates. It is difficult to get your body what it needs when you eat foods with fewer nutrients. Examples of foods that you should avoid that are high in calories and carbohydrates but low in nutrients include:  Trans fats (most processed foods list trans fats on the Nutrition Facts label).  Regular soda.  Juice.  Candy.  Sweets, such as cake, pie, doughnuts, and cookies.  Fried foods. WHAT FOODS CAN I EAT? Have nutrient-rich foods, which will nourish your body and keep you healthy. The food you should eat also will depend on several factors, including:  The calories you need.  The medicines you take.  Your weight.  Your blood glucose level.  Your blood pressure level.  Your cholesterol level. You also should eat a variety of foods, including:  Protein, such as meat, poultry, fish, tofu, nuts, and seeds (lean animal proteins are best).  Fruits.  Vegetables.  Dairy products, such as milk, cheese, and yogurt (low fat is best).  Breads, grains, pasta, cereal, rice, and beans.  Fats such as olive oil, trans fat-free margarine, canola oil, avocado, and olives. DOES EVERYONE WITH DIABETES MELLITUS HAVE THE SAME MEAL PLAN? Because every person with diabetes mellitus is different, there is not one meal plan that works for everyone. It is very important that you meet with a dietitian who will help you create a meal plan that is just right for you. Document Released: 06/13/2005 Document Revised: 09/21/2013 Document Reviewed: 08/13/2013 [employer] Patient Information 2015 [employer]. This   information is not intended to replace advice given to you by your health care provider. Make sure you discuss any questions you have with your health care provider.  

## 2015-06-21 ENCOUNTER — Telehealth: Payer: Self-pay

## 2015-06-21 MED ORDER — INSULIN LISPRO 200 UNIT/ML ~~LOC~~ SOPN: 20.0000 [IU] | PEN_INJECTOR | Freq: Three times a day (TID) | SUBCUTANEOUS | Status: DC

## 2015-06-21 NOTE — Telephone Encounter (Signed)
[facility] faxed a letter stating due to patient Insurance Medicare Part D they will not cover Novolog. There needs to be failed or have contraindication or intolerance to Humalog Kwikpen or Humalog Cartridge.

## 2015-06-29 ENCOUNTER — Encounter: Payer: Self-pay | Admitting: Pain Medicine

## 2015-06-29 ENCOUNTER — Ambulatory Visit: Payer: Medicare Other | Attending: Pain Medicine | Admitting: Pain Medicine

## 2015-06-29 VITALS — BP 152/74 | HR 92 | Temp 98.3°F | Resp 16 | Ht 61.0 in | Wt 186.0 lb

## 2015-06-29 DIAGNOSIS — M5481 Occipital neuralgia: Secondary | ICD-10-CM

## 2015-06-29 DIAGNOSIS — M47817 Spondylosis without myelopathy or radiculopathy, lumbosacral region: Secondary | ICD-10-CM | POA: Diagnosis not present

## 2015-06-29 DIAGNOSIS — E1140 Type 2 diabetes mellitus with diabetic neuropathy, unspecified: Secondary | ICD-10-CM | POA: Diagnosis not present

## 2015-06-29 DIAGNOSIS — M5030 Other cervical disc degeneration, unspecified cervical region: Secondary | ICD-10-CM

## 2015-06-29 DIAGNOSIS — M533 Sacrococcygeal disorders, not elsewhere classified: Secondary | ICD-10-CM | POA: Diagnosis not present

## 2015-06-29 DIAGNOSIS — M5416 Radiculopathy, lumbar region: Secondary | ICD-10-CM | POA: Insufficient documentation

## 2015-06-29 DIAGNOSIS — Z9889 Other specified postprocedural states: Secondary | ICD-10-CM | POA: Diagnosis not present

## 2015-06-29 DIAGNOSIS — M961 Postlaminectomy syndrome, not elsewhere classified: Secondary | ICD-10-CM

## 2015-06-29 DIAGNOSIS — M5136 Other intervertebral disc degeneration, lumbar region: Secondary | ICD-10-CM

## 2015-06-29 DIAGNOSIS — M160 Bilateral primary osteoarthritis of hip: Secondary | ICD-10-CM

## 2015-06-29 DIAGNOSIS — M48062 Spinal stenosis, lumbar region with neurogenic claudication: Secondary | ICD-10-CM

## 2015-06-29 DIAGNOSIS — M545 Low back pain: Secondary | ICD-10-CM | POA: Diagnosis present

## 2015-06-29 DIAGNOSIS — E1340 Other specified diabetes mellitus with diabetic neuropathy, unspecified: Secondary | ICD-10-CM

## 2015-06-29 DIAGNOSIS — M79609 Pain in unspecified limb: Secondary | ICD-10-CM | POA: Diagnosis not present

## 2015-06-29 MED ORDER — FENTANYL 75 MCG/HR TD PT72: MEDICATED_PATCH | TRANSDERMAL | Status: DC

## 2015-06-29 MED ORDER — FENTANYL 100 MCG/HR TD PT72: MEDICATED_PATCH | TRANSDERMAL | Status: DC

## 2015-06-29 MED ORDER — OXYCODONE HCL 10 MG PO TABS: ORAL_TABLET | ORAL | Status: DC

## 2015-06-29 NOTE — Progress Notes (Signed)
Safety precautions to be maintained throughout the outpatient stay will include: orient to surroundings, keep bed in low position, maintain call bell within reach at all times, provide assistance with transfer out of bed and ambulation.  

## 2015-06-29 NOTE — Patient Instructions (Addendum)
PLAN   Continue present medication oxycodone and fentanyl patches  Radiofrequency rhizolysis lumbar facet, medial branch nerves, to be performed at time return appointment  F/U PCP Dr.Sowles  for evaliation of  BP and general medical  condition  F/U surgical evaluation. May consider pending follow-up evaluations  F/U neurological evaluation. May consider pending follow-up evaluations  May consider radiofrequency rhizolysis or intraspinal procedures pending response to present treatment and F/U evaluation   Patient to call Pain Management Center should patient have concerns prior to scheduled return appointment.

## 2015-06-29 NOTE — Progress Notes (Signed)
   Subjective:    Patient ID: Lorraine Jones, female    DOB: 10-07-1950, 64 y.o.   MRN: [id number]  HPI Patient is 64 year old female returns to [facility] for further evaluation and treatment of pain involving the lower back lower extremity region especially the right lower extremity region. Patient states she has had return of pain involving the lower back and lower extremity region. Patient states pain is aggravated by standing walking twisting turning maneuvers and interferes with patient's ability to obtain restful sleep as well as performs several activities of daily living. Patient states she is unable to stand  direct without experiencing severely disabling pain and the twisting turning maneuvers. Produce severe disabling pain. We discussed patient's condition and will attempt to obtain approval for radiofrequency rhizolysis lumbar facets, medial branch radiofrequency rhizolysis since patient has had greater than 50% relief of pain with prior lumbar facet, medial branch nerve, blocks. The patient was in agreement with suggested treatment plan.     Review of Systems     Objective:   Physical Exam  There was tends to palpation of the paraspinal muscular region cervical region cervical facet region a mild degree. There was mild tenderness of the splenius capitis and occipitalis musculature regions. Patient appeared to be with bilaterally equal grip strength. Tinel and Phalen's maneuver were without increase of pain of significant degree. There was tends to palpation over the thoracic facet thoracic paraspinal muscles region on the right. The left. No crepitus of the thoracic region was noted.there was tennis to palpation of the acromioclavicular and glenohumeral joint regions of mild degree. There appeared to be unremarkable Spurling's maneuver. Palpation over the thoracic paraspinal muscles and the thoracic facet region was without crepitus of the thoracic region noted. Palpation over  the lumbar paraspinal muscles region lumbar facet region with associated severe discomfort right greater than left with lateral bending and rotation reproducing severe pain on the right compared to the left. Straight leg raising was limited approximately 20 without a definite increased pain with dorsiflexion noted. There was negative clonus negative Homans. DTRs were difficult to elicit patient had difficulty relaxing. There was increase of pain of moderate degree with palpation of the PSIS and PII S region with severe disabling pain in the region of the PSIS and PII S region on the right greater than the left. DTRs were difficult to elicit patient had difficulty relaxing. Lateral bending and rotation reproduce severe disabling pain and. Reproduced most significant portion of patient's pain. There was negative clonus negative Homans. Abdomen was nontender with no costovertebral maintenance noted.      Assessment & Plan:  Degenerative disc disease lumbar spine Status post prior surgical intervention lumbar region with L5-S1 scar formation, facet hypertrophy, and diffuse degenerative changes noted throughout the lumbar spine  Lumbar facet syndrome  Lumbar radiculopathy  Sacroiliac joint disease  Diabetic neuropathy    PLAN   Continue present medication oxycodone and fentanyl patches  Radiofrequency rhizolysis lumbar facet, medial branch nerves, to be performed at time return appointment  F/U PCP  Dr.Sowles for evaliation of  BP and general medical  condition  F/U surgical evaluation. May consider pending follow-up evaluations  F/U neurological evaluation. May consider pending follow-up evaluatio   Patient to call Pain Management Center should patient have concerns prior to scheduled return appointment.

## 2015-07-13 DIAGNOSIS — H40053 Ocular hypertension, bilateral: Secondary | ICD-10-CM | POA: Diagnosis not present

## 2015-07-13 DIAGNOSIS — H40023 Open angle with borderline findings, high risk, bilateral: Secondary | ICD-10-CM | POA: Diagnosis not present

## 2015-07-17 ENCOUNTER — Other Ambulatory Visit: Payer: Self-pay

## 2015-07-17 MED ORDER — METOCLOPRAMIDE HCL 5 MG PO TABS: 5.0000 mg | ORAL_TABLET | Freq: Three times a day (TID) | ORAL | Status: DC

## 2015-07-17 NOTE — Telephone Encounter (Signed)
Patient requesting refill. 

## 2015-07-19 ENCOUNTER — Ambulatory Visit: Payer: Medicare Other | Attending: Pain Medicine | Admitting: Pain Medicine

## 2015-07-19 ENCOUNTER — Encounter: Payer: Self-pay | Admitting: Pain Medicine

## 2015-07-19 VITALS — BP 138/56 | HR 85 | Temp 98.2°F | Resp 14 | Ht 61.0 in | Wt 186.0 lb

## 2015-07-19 DIAGNOSIS — M79605 Pain in left leg: Secondary | ICD-10-CM | POA: Diagnosis present

## 2015-07-19 DIAGNOSIS — M48062 Spinal stenosis, lumbar region with neurogenic claudication: Secondary | ICD-10-CM

## 2015-07-19 DIAGNOSIS — M51369 Other intervertebral disc degeneration, lumbar region without mention of lumbar back pain or lower extremity pain: Secondary | ICD-10-CM

## 2015-07-19 DIAGNOSIS — M5136 Other intervertebral disc degeneration, lumbar region: Secondary | ICD-10-CM | POA: Diagnosis not present

## 2015-07-19 DIAGNOSIS — M5030 Other cervical disc degeneration, unspecified cervical region: Secondary | ICD-10-CM

## 2015-07-19 DIAGNOSIS — Z9889 Other specified postprocedural states: Secondary | ICD-10-CM | POA: Diagnosis not present

## 2015-07-19 DIAGNOSIS — M79604 Pain in right leg: Secondary | ICD-10-CM | POA: Diagnosis present

## 2015-07-19 DIAGNOSIS — M47817 Spondylosis without myelopathy or radiculopathy, lumbosacral region: Secondary | ICD-10-CM | POA: Diagnosis not present

## 2015-07-19 DIAGNOSIS — E1340 Other specified diabetes mellitus with diabetic neuropathy, unspecified: Secondary | ICD-10-CM

## 2015-07-19 DIAGNOSIS — M5481 Occipital neuralgia: Secondary | ICD-10-CM

## 2015-07-19 DIAGNOSIS — M961 Postlaminectomy syndrome, not elsewhere classified: Secondary | ICD-10-CM

## 2015-07-19 DIAGNOSIS — M160 Bilateral primary osteoarthritis of hip: Secondary | ICD-10-CM

## 2015-07-19 DIAGNOSIS — M545 Low back pain: Secondary | ICD-10-CM | POA: Diagnosis present

## 2015-07-19 DIAGNOSIS — M47816 Spondylosis without myelopathy or radiculopathy, lumbar region: Secondary | ICD-10-CM | POA: Insufficient documentation

## 2015-07-19 DIAGNOSIS — M533 Sacrococcygeal disorders, not elsewhere classified: Secondary | ICD-10-CM

## 2015-07-19 MED ORDER — MIDAZOLAM HCL 5 MG/5ML IJ SOLN
5.0000 mg | Freq: Once | INTRAMUSCULAR | Status: AC
  Administered 2015-07-19: 2 mg via INTRAVENOUS

## 2015-07-19 MED ORDER — CEFAZOLIN SODIUM 1-5 GM-% IV SOLN: 1.0000 g | Freq: Once | INTRAVENOUS | Status: DC

## 2015-07-19 MED ORDER — CEFUROXIME AXETIL 250 MG PO TABS: 250.0000 mg | ORAL_TABLET | Freq: Two times a day (BID) | ORAL | Status: DC

## 2015-07-19 MED ORDER — LACTATED RINGERS IV SOLN: 1000.0000 mL | INTRAVENOUS | Status: DC

## 2015-07-19 MED ORDER — SODIUM CHLORIDE 0.9 % IJ SOLN: 20.0000 mL | Freq: Once | INTRAMUSCULAR | Status: DC

## 2015-07-19 MED ORDER — LIDOCAINE HCL (PF) 1 % IJ SOLN
10.0000 mL | Freq: Once | INTRAMUSCULAR | Status: AC
  Administered 2015-07-19: 10:00:00 via SUBCUTANEOUS

## 2015-07-19 MED ORDER — LIDOCAINE HCL (PF) 1 % IJ SOLN
INTRAMUSCULAR | Status: AC
  Filled 2015-07-19: qty 10

## 2015-07-19 MED ORDER — FENTANYL CITRATE (PF) 100 MCG/2ML IJ SOLN
100.0000 ug | Freq: Once | INTRAMUSCULAR | Status: AC
  Administered 2015-07-19: 100 ug via INTRAVENOUS

## 2015-07-19 MED ORDER — MIDAZOLAM HCL 5 MG/5ML IJ SOLN
INTRAMUSCULAR | Status: AC
  Administered 2015-07-19: 2 mg via INTRAVENOUS
  Filled 2015-07-19: qty 5

## 2015-07-19 MED ORDER — FENTANYL CITRATE (PF) 100 MCG/2ML IJ SOLN
INTRAMUSCULAR | Status: AC
  Administered 2015-07-19: 100 ug via INTRAVENOUS
  Filled 2015-07-19: qty 2

## 2015-07-19 MED ORDER — BUPIVACAINE HCL (PF) 0.25 % IJ SOLN: 30.0000 mL | Freq: Once | INTRAMUSCULAR | Status: DC

## 2015-07-19 MED ORDER — ORPHENADRINE CITRATE 30 MG/ML IJ SOLN: 60.0000 mg | Freq: Once | INTRAMUSCULAR | Status: DC

## 2015-07-19 NOTE — Progress Notes (Signed)
Safety precautions to be maintained throughout the outpatient stay will include: orient to surroundings, keep bed in low position, maintain call bell within reach at all times, provide assistance with transfer out of bed and ambulation.  

## 2015-07-19 NOTE — Patient Instructions (Addendum)
PLAN  Continue present medication oxycodone and fentanyl patch and begin taking antibiotic Ceftin as prescribed. Please obtain your antibiotic today and begin taking antibiotic today  F/U PCP Dr.  Ancil Boozer for evaliation of  BPdiabetes mellitusd general medical  condition.  F/U surgical evaluation. May consider pending follow-up evaluations  F/U neurological evaluation. May consider pending follow-up evaluations  May consider radiofrequency rhizolysis or intraspinal procedures pending response to present treatment and F/U evaluation.  Patient to call Pain Management Center should patient have concerns prior to scheduled return appointment.   Pain Management Discharge Instructions  General Discharge Instructions :  If you need to reach your doctor call: Monday-Friday 8:00 am - 4:00 pm at [phone] or toll free [phone].  After clinic hours [phone] to have operator reach doctor.  Bring all of your medication bottles to all your appointments in the pain clinic.  To cancel or reschedule your appointment with Pain Management please remember to call 24 hours in advance to avoid a fee.  Refer to the educational materials which you have been given on: General Risks, I had my Procedure. Discharge Instructions, Post Sedation.  Post Procedure Instructions:  The drugs you were given will stay in your system until tomorrow, so for the next 24 hours you should not drive, make any legal decisions or drink any alcoholic beverages.  You may eat anything you prefer, but it is better to start with liquids then soups and crackers, and gradually work up to solid foods.  Please notify your doctor immediately if you have any unusual bleeding, trouble breathing or pain that is not related to your normal pain.  Depending on the type of procedure that was done, some parts of your body may feel week and/or numb.  This usually clears up by tonight or the next day.  Walk with the use of an  assistive device or accompanied by an adult for the 24 hours.  You may use ice on the affected area for the first 24 hours.  Put ice in a Ziploc bag and cover with a towel and place against area 15 minutes on 15 minutes off.  You may switch to heat after 24 hours.  A prescription for CEFTIN was sent to your pharmacy and should be available for pickup today.

## 2015-07-19 NOTE — Progress Notes (Signed)
Subjective:    Patient ID: Lorraine Jones, female    DOB: 1951/06/16, 64 y.o.   MRN: [id number]  HPI  PROCEDURE PERFORMED: Radiofrequency rhizolysis (lunbar facet medial branch nerve radiofrequency rhizolysis).  The procedure was performed with the COOLIEF technique.  COOLIEF technique was performed using the Synergy cooled radiofrequency probe [id number]-17-150-4, the synergy cooled radiofrequency introducer SII-17-150, and the synergy cooled sterile tube kit [id number].   HISTORY: The patient is a 64 y.o. female who returns to the [facility] for further evaluation of severely disabling pain involving the lumbar and lower extremity region.  MRI revealed degenerative disc disease lumbar spine Status post prior surgical intervention lumbar region with L5-S1 scar formation, facet hypertrophy, and diffuse degenerative changes noted throughout the lumbar spine   There is concern regarding significant component of patient's pain being due to facet arthropathy with facet syndrome. The patient has had significant relief of pain following previous lumbar facet, medial branch nerve, blocks. Radiofrequency rhizolysis of the lumbar facets, medial branch nerves, will be performed at this time an attempt to provide longer lasting relief of the patient's pain, minimize progression of the patient's symptoms, and avoid the need for more involved treatment. The risks, benefits and expectations of the procedure have been discussed and explained to the patient who was with understanding and wished to proceed with interventional treatment in an attempt to decrease severely  disabling pain of the lumbar and lower extremity region.  We will proceed with what is felt to be a medically necessary procedure, radiofrequency rhizolysis (lumbar facet medial branch nerve radiofrequency rhizolysis) using the COOLIEF technique.  DESCRIPTION OF PROCEDURE: COOLIEF Technique Radiofrequency Rhizolysis of Lumbar Facets (Medial Branch  Nerves Radiofrequency Rhizolysis) with IV Versed, IV Fentanyl conscious sedation, EKG, blood pressure, pulse, and pulse oximetry monitoring.  The procedure was performed with the patient in the prone position under fluoroscopic guidance.  Following Betadine prep of the proposed entry site, a 1.5% lidocaine plain skin wheal of the proposed needle entry site was  prepared in oblique orientation.   Right, L5 Radiofrequency Rhizolysis right L5 Facet  (Medial Branch Nerve): Under fluoroscopic guidance, the radiofrequency needle was inserted at the L5 vertebral body level after a local anesthetic skin wheal of 1.5% lidocaine plain and Betadine prep of the proposed needle entry site was performed.  The needle was inserted under fluoroscopic guidance in the area known as Burton's eye or eye of the Scotty dog with needle placed at the superior and lateral border of the targeted area with oblique orientation utilizing the tunnel (gun  barrel approach) technique to the targeted area.  Right Radiofrequency Rhizolysis at L4 and L3  Lumbar Facets (Medial Branch Nerves) The procedure was performed at L4 and L3 exactly as was performed at the L5 and under fluoroscopic guidance utilizing the identical technique as utilized at the L5 vertebral body level.     Needle was then verified on lateral view and following needle placement verification on lateral view, radiofrequency testing was then carried out with motor testing at 2 Hz stimulation without evidence of stimulation of the lower extremity. Following radiofrequency testing for motor testing, each radiofrequency probe was then prepared with 2 mL of preservative-free lidocaine and following 1 minute of anesthetizing each proposed radiofrequency lesioning  Level, the radiofrequency probe was then inserted in the right, L5 vertebral body level needle with radiofrequency lesioning carried out for 2-1/2 minutes with temperature of the tissue being maintained at 80 degrees  centigrade for 2-1/2 minutes.  Radiofrequency probe was then inserted in the right, L4 vertebral body level needle and radiofrequency lesioning was performed at 80 degrees centigrade tissue temperature for 2-1/2 minutes.  Radiofrequency probe was then inserted in the right L3 vertebral body level needle and radiofrequency lesioning was performed at 80 degrees centigrade tissue temperature for 2-1/2 minutes  The patient tolerated the procedure well.   PLAN: 1. Medications: The patient will continue the present medications. 2. The patient will follow up with Dr. Ancil Boozer regarding blood pressure and diabetes mellitus with and general medical condition as discussed with the patient.  3. Surgical evaluation as discussed . 4. Neurological evaluation as discussed.  5. The patient has been advised to call the [facility] prior to scheduled return appointment should there be significant change in the patient's condition or should the patient have other concerns regarding condition prior to scheduled return appointment.  The patient is with understanding and is in agreement with suggested treatment plan.    Review of Systems     Objective:   Physical Exam        Assessment & Plan:

## 2015-07-20 ENCOUNTER — Telehealth: Payer: Self-pay | Admitting: *Deleted

## 2015-07-20 NOTE — Telephone Encounter (Signed)
No problems post procedure. 

## 2015-07-25 ENCOUNTER — Encounter: Payer: Medicare Other | Admitting: Pain Medicine

## 2015-07-26 ENCOUNTER — Ambulatory Visit: Payer: Medicare Other | Attending: Pain Medicine | Admitting: Pain Medicine

## 2015-07-26 ENCOUNTER — Encounter: Payer: Self-pay | Admitting: Pain Medicine

## 2015-07-26 VITALS — BP 169/69 | HR 89 | Temp 98.2°F | Resp 18 | Ht 61.0 in | Wt 186.0 lb

## 2015-07-26 DIAGNOSIS — Z79899 Other long term (current) drug therapy: Secondary | ICD-10-CM | POA: Diagnosis not present

## 2015-07-26 DIAGNOSIS — M48062 Spinal stenosis, lumbar region with neurogenic claudication: Secondary | ICD-10-CM

## 2015-07-26 DIAGNOSIS — M791 Myalgia: Secondary | ICD-10-CM | POA: Diagnosis not present

## 2015-07-26 DIAGNOSIS — M1611 Unilateral primary osteoarthritis, right hip: Secondary | ICD-10-CM | POA: Diagnosis not present

## 2015-07-26 DIAGNOSIS — M79606 Pain in leg, unspecified: Secondary | ICD-10-CM | POA: Diagnosis not present

## 2015-07-26 DIAGNOSIS — E1140 Type 2 diabetes mellitus with diabetic neuropathy, unspecified: Secondary | ICD-10-CM | POA: Insufficient documentation

## 2015-07-26 DIAGNOSIS — M5136 Other intervertebral disc degeneration, lumbar region: Secondary | ICD-10-CM | POA: Diagnosis not present

## 2015-07-26 DIAGNOSIS — Z5181 Encounter for therapeutic drug level monitoring: Secondary | ICD-10-CM | POA: Diagnosis not present

## 2015-07-26 DIAGNOSIS — M5416 Radiculopathy, lumbar region: Secondary | ICD-10-CM | POA: Insufficient documentation

## 2015-07-26 DIAGNOSIS — M533 Sacrococcygeal disorders, not elsewhere classified: Secondary | ICD-10-CM | POA: Diagnosis not present

## 2015-07-26 DIAGNOSIS — M5481 Occipital neuralgia: Secondary | ICD-10-CM

## 2015-07-26 DIAGNOSIS — Z0389 Encounter for observation for other suspected diseases and conditions ruled out: Secondary | ICD-10-CM | POA: Diagnosis not present

## 2015-07-26 DIAGNOSIS — M160 Bilateral primary osteoarthritis of hip: Secondary | ICD-10-CM

## 2015-07-26 DIAGNOSIS — M545 Low back pain: Secondary | ICD-10-CM | POA: Insufficient documentation

## 2015-07-26 DIAGNOSIS — Z9889 Other specified postprocedural states: Secondary | ICD-10-CM | POA: Diagnosis not present

## 2015-07-26 DIAGNOSIS — M47817 Spondylosis without myelopathy or radiculopathy, lumbosacral region: Secondary | ICD-10-CM | POA: Diagnosis not present

## 2015-07-26 DIAGNOSIS — M5030 Other cervical disc degeneration, unspecified cervical region: Secondary | ICD-10-CM

## 2015-07-26 DIAGNOSIS — M51369 Other intervertebral disc degeneration, lumbar region without mention of lumbar back pain or lower extremity pain: Secondary | ICD-10-CM

## 2015-07-26 DIAGNOSIS — F1120 Opioid dependence, uncomplicated: Secondary | ICD-10-CM | POA: Diagnosis not present

## 2015-07-26 DIAGNOSIS — M961 Postlaminectomy syndrome, not elsewhere classified: Secondary | ICD-10-CM

## 2015-07-26 DIAGNOSIS — E1340 Other specified diabetes mellitus with diabetic neuropathy, unspecified: Secondary | ICD-10-CM

## 2015-07-26 MED ORDER — OXYCODONE HCL 10 MG PO TABS: ORAL_TABLET | ORAL | Status: DC

## 2015-07-26 MED ORDER — FENTANYL 75 MCG/HR TD PT72: MEDICATED_PATCH | TRANSDERMAL | Status: DC

## 2015-07-26 MED ORDER — FENTANYL 100 MCG/HR TD PT72: MEDICATED_PATCH | TRANSDERMAL | Status: DC

## 2015-07-26 NOTE — Progress Notes (Signed)
   Subjective:    Patient ID: Lorraine Jones, female    DOB: Feb 16, 1951, 64 y.o.   MRN: [id number]  HPI patient is 64 year old female who returns to [facility] for further evaluation and treatment of pain involving the lower back lower extremity region. 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 Patient is status post radiofrequency rhizolysis lumbar facet, medial branch nerves, at the present time we will continue to observe patient's response to radiofrequency procedures and will continue patient's medications of oxycodone and fentanyl patches. The patient was understanding and in agreement status treatment plan.     Review of Systems     Objective:   Physical Exam  There was tends to palpation of the splenius capitis and occipitalis musculature regions. Palpation of these regions reproduced pain of minimal degree. There was minimal tenderness of the splenius capitis the and occipitalis musculature region on the left as well as on the right. Palpation over the cervical facet cervical paraspinal musculature region with tinged palpation of mild degree. There was unremarkable Spurling's maneuver. Tinel and Phalen's maneuver without increased pain of significant degree. Patient appeared to be with bilaterally equal grip strength. Palpation over the thoracic facet thoracic paraspinal must region was without definite pain. Palpation over the lumbar paraspinal muscles lumbar facet region associated with pain of moderate to moderately severe degree. [place] rotation and extension to palpation lumbar facets reproduce moderate discomfort. There was moderate tends to palpation of the PSIS and PII S regions as well as the gluteal and piriformis musculature region. Straight leg raising was tolerates approximately 20 without increased pain with dorsiflexion noted. No  definite sensory deficit of dermatomal description was detected. There was negative clonus negative Homans. Abdomen was nontender with no costovertebral angle tenderness noted. There was mild tends to palpation over the region of the greater trochanteric region and iliotibial band region.      Assessment & Plan:     Degenerative disc disease lumbar spine Status post prior surgical intervention lumbar region with L5-S1 scar formation, facet hypertrophy, and diffuse degenerative changes noted throughout the lumbar spine  Lumbar facet syndrome  Lumbar radiculopathy  Sacroiliac joint disease  Diabetic neuropathy    PLAN   Continue present medication oxycodone and fentanyl patches  F/U PCP  Dr.Sowles for evaliation of  BP diabetes mellitus and general medical  condition  F/U surgical evaluation. May consider pending follow-up evaluations  F/U neurological evaluation. May consider pending follow-up evaluatio   Patient to call [facility] Management Center should patient have concerns prior to scheduled return appointment.

## 2015-07-26 NOTE — Patient Instructions (Signed)
PLAN   Continue present medication oxycodone and fentanyl patch  F/U PCP Dr.Sowles  for evaliation of  BP and general medical  condition  F/U surgical evaluation. May consider pending follow-up evaluations  F/U neurological evaluation. May consider pending follow-up evaluations  May consider radiofrequency rhizolysis or intraspinal procedures pending response to present treatment and F/U evaluation   Patient to call Pain Management Center should patient have concerns prior to scheduled return appointment.

## 2015-07-26 NOTE — Progress Notes (Signed)
Safety precautions to be maintained throughout the outpatient stay will include: orient to surroundings, keep bed in low position, maintain call bell within reach at all times, provide assistance with transfer out of bed and ambulation.  

## 2015-08-17 ENCOUNTER — Ambulatory Visit: Payer: Medicare Other | Admitting: Family Medicine

## 2015-08-21 ENCOUNTER — Other Ambulatory Visit: Payer: Self-pay | Admitting: Pain Medicine

## 2015-08-22 ENCOUNTER — Encounter: Payer: Self-pay | Admitting: Pain Medicine

## 2015-08-22 ENCOUNTER — Ambulatory Visit: Payer: Medicare Other | Attending: Pain Medicine | Admitting: Pain Medicine

## 2015-08-22 VITALS — BP 130/70 | HR 48 | Temp 98.3°F | Resp 16 | Ht 61.0 in | Wt 186.0 lb

## 2015-08-22 DIAGNOSIS — M48062 Spinal stenosis, lumbar region with neurogenic claudication: Secondary | ICD-10-CM

## 2015-08-22 DIAGNOSIS — F1120 Opioid dependence, uncomplicated: Secondary | ICD-10-CM | POA: Diagnosis not present

## 2015-08-22 DIAGNOSIS — M5116 Intervertebral disc disorders with radiculopathy, lumbar region: Secondary | ICD-10-CM | POA: Insufficient documentation

## 2015-08-22 DIAGNOSIS — M533 Sacrococcygeal disorders, not elsewhere classified: Secondary | ICD-10-CM | POA: Insufficient documentation

## 2015-08-22 DIAGNOSIS — Z9889 Other specified postprocedural states: Secondary | ICD-10-CM | POA: Insufficient documentation

## 2015-08-22 DIAGNOSIS — M791 Myalgia: Secondary | ICD-10-CM | POA: Diagnosis not present

## 2015-08-22 DIAGNOSIS — K5909 Other constipation: Secondary | ICD-10-CM

## 2015-08-22 DIAGNOSIS — M160 Bilateral primary osteoarthritis of hip: Secondary | ICD-10-CM

## 2015-08-22 DIAGNOSIS — M47817 Spondylosis without myelopathy or radiculopathy, lumbosacral region: Secondary | ICD-10-CM | POA: Diagnosis not present

## 2015-08-22 DIAGNOSIS — M25551 Pain in right hip: Secondary | ICD-10-CM | POA: Diagnosis not present

## 2015-08-22 DIAGNOSIS — Z5181 Encounter for therapeutic drug level monitoring: Secondary | ICD-10-CM | POA: Diagnosis not present

## 2015-08-22 DIAGNOSIS — M5416 Radiculopathy, lumbar region: Secondary | ICD-10-CM | POA: Diagnosis not present

## 2015-08-22 DIAGNOSIS — Z0389 Encounter for observation for other suspected diseases and conditions ruled out: Secondary | ICD-10-CM | POA: Diagnosis not present

## 2015-08-22 DIAGNOSIS — M545 Low back pain: Secondary | ICD-10-CM | POA: Diagnosis present

## 2015-08-22 DIAGNOSIS — M5030 Other cervical disc degeneration, unspecified cervical region: Secondary | ICD-10-CM

## 2015-08-22 DIAGNOSIS — M79604 Pain in right leg: Secondary | ICD-10-CM | POA: Diagnosis present

## 2015-08-22 DIAGNOSIS — M961 Postlaminectomy syndrome, not elsewhere classified: Secondary | ICD-10-CM

## 2015-08-22 DIAGNOSIS — E1140 Type 2 diabetes mellitus with diabetic neuropathy, unspecified: Secondary | ICD-10-CM | POA: Diagnosis not present

## 2015-08-22 DIAGNOSIS — E1340 Other specified diabetes mellitus with diabetic neuropathy, unspecified: Secondary | ICD-10-CM

## 2015-08-22 DIAGNOSIS — Z79899 Other long term (current) drug therapy: Secondary | ICD-10-CM | POA: Diagnosis not present

## 2015-08-22 DIAGNOSIS — M79605 Pain in left leg: Secondary | ICD-10-CM | POA: Diagnosis present

## 2015-08-22 DIAGNOSIS — M5481 Occipital neuralgia: Secondary | ICD-10-CM

## 2015-08-22 DIAGNOSIS — M5136 Other intervertebral disc degeneration, lumbar region: Secondary | ICD-10-CM

## 2015-08-22 MED ORDER — FENTANYL 100 MCG/HR TD PT72: MEDICATED_PATCH | TRANSDERMAL | Status: DC

## 2015-08-22 MED ORDER — OXYCODONE HCL 10 MG PO TABS: ORAL_TABLET | ORAL | Status: DC

## 2015-08-22 MED ORDER — FENTANYL 75 MCG/HR TD PT72: MEDICATED_PATCH | TRANSDERMAL | Status: DC

## 2015-08-22 NOTE — Progress Notes (Signed)
   Subjective:    Patient ID: Lorraine Jones, female    DOB: [birth year], 64 y.o.   MRN: [id number]  HPI  The patient is a 64 year old female who returns to pain management for further evaluation and treatment of pain involving the lower back and lower extremity region. Patient has had significant improvement of pain with prior interventional treatment performed in pain management Center with greater function rhizolysis lumbar facet medial branch nerve improvement patient ability to perform twisting and turning maneuvers. Patient continues to be with some pain involving the lower extremity region especially on the right. The patient also admits to pain occurring in the region of the right hip. We discussed interventional treatment of pain of the right hip and will consider patient for additional modifications of treatment pending follow-up evaluation At the present time we will continue patient's oxycodone and fentanyl patches.     Review of Systems     Objective:   Physical Exam   there was tenderness of the splenius capitis and a separate talus musculature regions of minimal degree. There was minimal tenderness of the cervical facet cervical paraspinal muscular region. There was unremarkable Spurling's maneuver. There was unremarkable Tinel and Phalen's maneuver. Patient was with bilaterally equal grip strength. There was minimal tense to palpation of the acromioclavicular and glenohumeral joint regions. Palpation of the thoracic facet thoracic paraspinal must reason reproduced minimal discomfort. Palpation over the lumbar paraspinal muscle region lumbosacral region associated with moderate to moderately severe discomfort. Lateral bending rotation extension and palpation of the lumbar facets reproduce moderate severe discomfort. Patient was a positive Patrick's maneuver and severe increased pain with palpation over the PSIS and PSIS region as well as tends to palpation greater trochanteric region  iliotibial band region. Straight leg raise was tolerates approximately 20 without increased pain with dorsiflexion noted. DTRs difficult to elicit patient had difficulty relaxing. Negative clonus negative Homans. Abdomen nontender with no costovertebral tenderness noted.       Assessment & Plan:     Degenerative disc disease lumbar spine Status post prior surgical intervention lumbar region with L5-S1 scar formation, facet hypertrophy, and diffuse degenerative changes noted throughout the lumbar spine  Lumbar facet syndrome  Lumbar radiculopathy  Sacroiliac joint disease  Diabetic neuropathy    PLAN   Continue present medication oxycodone and fentanyl patches  F/U PCP Dr.Sowles  for evaliation of  BP and general medical  condition  F/U surgical evaluation. May consider pending follow-up evaluations  F/U neurological evaluation. May consider pending follow-up evaluations  May consider radiofrequency rhizolysis or intraspinal procedures pending response to present treatment and F/U evaluation   Patient to call Pain Management Center should patient have concerns prior to scheduled return appointment.

## 2015-08-22 NOTE — Patient Instructions (Addendum)
PLAN   Continue present medication oxycodone and fentanyl patches  F/U PCP Dr.Sowles  for evaliation of  BP and general medical  condition  F/U surgical evaluation. May consider pending follow-up evaluations  F/U neurological evaluation. May consider pending follow-up evaluations  May consider radiofrequency rhizolysis or intraspinal procedures pending response to present treatment and F/U evaluation   Patient to call Pain Management Center should patient have concerns prior to scheduled return appointment.

## 2015-08-22 NOTE — Progress Notes (Signed)
Safety precautions to be maintained throughout the outpatient stay will include: orient to surroundings, keep bed in low position, maintain call bell within reach at all times, provide assistance with transfer out of bed and ambulation.  

## 2015-09-07 ENCOUNTER — Ambulatory Visit (INDEPENDENT_AMBULATORY_CARE_PROVIDER_SITE_OTHER): Payer: Medicare Other | Admitting: Family Medicine

## 2015-09-07 ENCOUNTER — Encounter: Payer: Self-pay | Admitting: Family Medicine

## 2015-09-07 VITALS — BP 130/78 | HR 94 | Temp 97.9°F | Resp 16 | Ht 61.0 in | Wt 197.0 lb

## 2015-09-07 DIAGNOSIS — E1140 Type 2 diabetes mellitus with diabetic neuropathy, unspecified: Secondary | ICD-10-CM

## 2015-09-07 DIAGNOSIS — G8929 Other chronic pain: Secondary | ICD-10-CM

## 2015-09-07 DIAGNOSIS — E1165 Type 2 diabetes mellitus with hyperglycemia: Secondary | ICD-10-CM | POA: Diagnosis not present

## 2015-09-07 DIAGNOSIS — Z794 Long term (current) use of insulin: Secondary | ICD-10-CM

## 2015-09-07 DIAGNOSIS — IMO0002 Reserved for concepts with insufficient information to code with codable children: Secondary | ICD-10-CM

## 2015-09-07 LAB — POCT UA - MICROALBUMIN: MICROALBUMIN (UR) POC: 20 mg/L

## 2015-09-07 MED ORDER — INSULIN LISPRO 200 UNIT/ML ~~LOC~~ SOPN: 24.0000 [IU] | PEN_INJECTOR | Freq: Three times a day (TID) | SUBCUTANEOUS | Status: DC

## 2015-09-07 MED ORDER — INSULIN GLARGINE 100 UNIT/ML SOLOSTAR PEN: 60.0000 [IU] | PEN_INJECTOR | Freq: Every day | SUBCUTANEOUS | Status: DC

## 2015-09-07 NOTE — Progress Notes (Signed)
Name: Lorraine Jones   MRN: [id number]    DOB: June 11, 1951   Date:09/07/2015       Progress Note  Subjective  Chief Complaint  Chief Complaint  Patient presents with  . Medication Refill    follow-up  . Diabetes    Checking glucose 2x every other day. Low-180, high-240  . Hypertension  . Hyperlipidemia  . Pain    chronic back pain seeing Dr. Primus Bravo    HPI  DMII type 2 insulin requiring: she is on Metformin, Actos and Humalog  Pre-meal 20 units, also Lantus 50 units daly.  hgbA1C has gone from 9.9% to 10.5% back in September, her glucose at home has been around 180's. . She states she has been compliant with medication. She denies polyphagia, but always drinking a lot of water and has polyuria. She denies blurred vision. Taking aspirin , ACE and statins as prescribed. She has diabetic neuropathy and states pain on her feet is under control with medication ( given by Dr. Primus Bravo for chronic back pain). She is not physically active and she states she tries to be compliant with diet but a few times weekly she eats candy or dessert. She also eats fried chicken three times weekly, also has pasta, bread with lunch - but is trying to cut down. Explained that glucose will not improve if she does not change her life style.   Patient Active Problem List   Diagnosis Date Noted  . Gastroesophageal reflux disease without esophagitis 05/12/2015  . Chronic pain 05/12/2015  . Hyperlipidemia 05/12/2015  . DDD (degenerative disc disease), lumbar 02/27/2015  . Lumbar post-laminectomy syndrome 02/27/2015  . Neuropathy due to secondary diabetes ([facility]) 02/27/2015  . Spinal stenosis, lumbar region, with neurogenic claudication 02/27/2015  . Sacroiliac joint dysfunction of both sides 02/27/2015  . Degenerative joint disease (DJD) of hip 02/27/2015  . DDD (degenerative disc disease), cervical 02/27/2015  . Bilateral occipital neuralgia 02/27/2015  . Chronic constipation 06/02/2014  . Hyponatremia 06/01/2014  .  Hypertension, benign 06/01/2014    Past Surgical History  Procedure Laterality Date  . Cholecystectomy  1996  . Lumbar disc surgery  1993    L4  --  L5  . Cystoscopy with ureteroscopy Right 09/01/2013    Procedure: CYSTOSCOPY WITH UNROOFING  RIGHT URETEROCELE AND URETERAL STONE REMOVED  WITH GYRUS COLLINS KNIFE. ;  Surgeon: Irine Seal, MD;  Location: [facility];  Service: Urology;  Laterality: Right;  cysto, right uretersoscopy and stone extraction   collins knife    Family History  Problem Relation Age of Onset  . Diabetes Mother   . Heart disease Mother   . Hypertension Mother     Social History   Social History  . Marital Status: Married    Spouse Name: N/A  . Number of Children: N/A  . Years of Education: N/A   Occupational History  . Not on file.   Social History Main Topics  . Smoking status: Current Every Day Smoker -- 0.50 packs/day for 12 years    Types: Cigarettes  . Smokeless tobacco: Never Used  . Alcohol Use: No  . Drug Use: No  . Sexual Activity: Yes   Other Topics Concern  . Not on file   Social History Narrative     Current outpatient prescriptions:  .  aspirin EC 81 MG tablet, Take 81 mg by mouth daily., Disp: , Rfl:  .  carvedilol (COREG) 3.125 MG tablet, Take 1 tablet (3.125 mg total) by  mouth 2 (two) times daily with a meal., Disp: 180 tablet, Rfl: 1 .  dorzolamide-timolol (COSOPT) 22.3-6.8 MG/ML ophthalmic solution, Place 1 drop into both eyes 2 (two) times daily., Disp: , Rfl:  .  fentaNYL (DURAGESIC - DOSED MCG/HR) 100 MCG/HR, Apply  1 patch to skin every 2 days if tolerated. The patch will be applied with a 75 g per hour fentanyl patch, Disp: 15 patch, Rfl: 0 .  fentaNYL (DURAGESIC - DOSED MCG/HR) 75 MCG/HR, Apply 1 patch to skin every 2 days if tolerated. The patch will be applied with a 100 g per hour fentanyl patch, Disp: 15 patch, Rfl: Seroquel .  Insulin Glargine (LANTUS) 100 UNIT/ML Solostar Pen, Inject 60 Units into  the skin daily at 10 pm., Disp: 15 mL, Rfl: 1 .  Insulin Lispro (HUMALOG KWIKPEN) 200 UNIT/ML SOPN, Inject 24 Units into the skin 3 (three) times daily before meals., Disp: 45 mL, Rfl: 1 .  metFORMIN (GLUCOPHAGE) 1000 MG tablet, Take 1 tablet (1,000 mg total) by mouth 2 (two) times daily with a meal., Disp: 180 tablet, Rfl: 1 .  metoCLOPramide (REGLAN) 5 MG tablet, Take 1 tablet (5 mg total) by mouth 3 (three) times daily., Disp: 90 tablet, Rfl: 1 .  Multiple Vitamins-Minerals (MULTIVITAMIN WITH MINERALS) tablet, Take 1 tablet by mouth daily., Disp: , Rfl:  .  Olmesartan-Amlodipine-HCTZ (TRIBENZOR) 40-10-25 MG TABS, Take 1 tablet by mouth daily., Disp: 90 tablet, Rfl: 1 .  Oxycodone HCl 10 MG TABS, Limit 1 tab by mouth 4-6 times per day if tolerated, Disp: 100 tablet, Rfl: 0 .  pioglitazone (ACTOS) 15 MG tablet, Take 1 tablet (15 mg total) by mouth daily., Disp: 90 tablet, Rfl: 1 .  polyethylene glycol (MIRALAX / GLYCOLAX) packet, Take 17 g by mouth daily., Disp: 14 each, Rfl: 0 .  pravastatin (PRAVACHOL) 40 MG tablet, Take 1 tablet (40 mg total) by mouth daily., Disp: 90 tablet, Rfl: 1  Allergies  Allergen Reactions  . Diclofenac Sodium Swelling  . Lodine [Etodolac] Swelling  . Naproxen Sodium Swelling  . Neurontin [Gabapentin] Swelling     ROS  Constitutional: Negative for fever ,but has  weight change.  Respiratory: Negative for cough and shortness of breath.   Cardiovascular: Negative for chest pain or palpitations.  Gastrointestinal: Negative for abdominal pain, no bowel changes - still has constipation .  Musculoskeletal: Positive  for gait problem but no joint swelling.  Skin: Negative for rash.  Neurological: Negative for dizziness or headache.  No other specific complaints in a complete review of systems (except as listed in HPI above).   Objective  Filed Vitals:   09/07/15 1132  BP: 130/78  Pulse: 94  Temp: 97.9 F (36.6 C)  TempSrc: Oral  Resp: 16  Height: 5\' 1"   (1.549 m)  Weight: 197 lb (89.359 kg)  SpO2: 90%    Body mass index is 37.24 kg/(m^2).  Physical Exam  Constitutional: Patient appears well-developed and well-nourished. Obese No distress.  HEENT: head atraumatic, normocephalic, pupils equal and reactive to light, neck supple, throat within normal limits Cardiovascular: Normal rate, regular rhythm and normal heart sounds.  No murmur heard. No BLE edema. Pulmonary/Chest: Effort normal and breath sounds normal. No respiratory distress. Abdominal: Soft.  There is no tenderness. Psychiatric: Patient has a normal mood and affect. behavior is normal. Judgment and thought content normal. Muscular Skeletal: still has a cane to assist with her gait. Pain during palpation of lumbar spine  Recent Results (from the past 2160  hour(s))  POCT UA - Microalbumin     Status: Normal   Collection Time: 06/19/15 12:10 PM  Result Value Ref Range   Microalbumin Ur, POC negative mg/L   Creatinine, POC  mg/dL   Albumin/Creatinine Ratio, Urine, POC    POCT HgB A1C     Status: None   Collection Time: 06/19/15 12:12 PM  Result Value Ref Range   Hemoglobin A1C 10.5   POCT UA - Microalbumin     Status: None   Collection Time: 09/07/15 11:53 AM  Result Value Ref Range   Microalbumin Ur, POC 20 mg/L   Creatinine, POC  mg/dL   Albumin/Creatinine Ratio, Urine, POC       PHQ2/9: Depression screen [facility] 2/9 08/22/2015 07/26/2015 06/29/2015 05/30/2015 04/27/2015  Decreased Interest 0 0 0 0 0  Down, Depressed, Hopeless 0 0 0 0 0  PHQ - 2 Score 0 0 0 0 0  Altered sleeping - - - - -  Tired, decreased energy - - - - -  Change in appetite - - - - -  Feeling bad or failure about yourself  - - - - -  Trouble concentrating - - - - -  Moving slowly or fidgety/restless - - - - -  Suicidal thoughts - - - - -  PHQ-9 Score - - - - -  Difficult doing work/chores - - - - -     Fall Risk: Fall Risk  08/22/2015 07/26/2015 07/19/2015 06/29/2015 05/30/2015  Falls in the  past year? (No Data) No No No No    Assessment & Plan  1. Uncontrolled type 2 diabetes mellitus with diabetic neuropathy, with long-term current use of insulin (HCC)  - POCT UA - Microalbumin Increase lantus to 60 units daily Increase Novolog to up to 24 units before meals  2. Chronic pain  Continue follow up with Dr. Primus Bravo., last steroid injection was in October, pain can also increase glucose, but needs to follow diet to get glucose under control

## 2015-09-08 ENCOUNTER — Other Ambulatory Visit: Payer: Self-pay | Admitting: Pain Medicine

## 2015-09-20 ENCOUNTER — Encounter: Payer: Self-pay | Admitting: Pain Medicine

## 2015-09-20 ENCOUNTER — Ambulatory Visit: Payer: Medicare Other | Attending: Pain Medicine | Admitting: Pain Medicine

## 2015-09-20 VITALS — BP 158/78 | HR 95 | Temp 98.6°F | Resp 18 | Ht 61.0 in | Wt 197.0 lb

## 2015-09-20 DIAGNOSIS — M533 Sacrococcygeal disorders, not elsewhere classified: Secondary | ICD-10-CM

## 2015-09-20 DIAGNOSIS — M545 Low back pain: Secondary | ICD-10-CM | POA: Diagnosis present

## 2015-09-20 DIAGNOSIS — M160 Bilateral primary osteoarthritis of hip: Secondary | ICD-10-CM

## 2015-09-20 DIAGNOSIS — E1340 Other specified diabetes mellitus with diabetic neuropathy, unspecified: Secondary | ICD-10-CM

## 2015-09-20 DIAGNOSIS — M5116 Intervertebral disc disorders with radiculopathy, lumbar region: Secondary | ICD-10-CM | POA: Diagnosis not present

## 2015-09-20 DIAGNOSIS — E1140 Type 2 diabetes mellitus with diabetic neuropathy, unspecified: Secondary | ICD-10-CM | POA: Diagnosis not present

## 2015-09-20 DIAGNOSIS — M47896 Other spondylosis, lumbar region: Secondary | ICD-10-CM | POA: Diagnosis not present

## 2015-09-20 DIAGNOSIS — M47817 Spondylosis without myelopathy or radiculopathy, lumbosacral region: Secondary | ICD-10-CM | POA: Diagnosis not present

## 2015-09-20 DIAGNOSIS — M961 Postlaminectomy syndrome, not elsewhere classified: Secondary | ICD-10-CM

## 2015-09-20 DIAGNOSIS — K5909 Other constipation: Secondary | ICD-10-CM

## 2015-09-20 DIAGNOSIS — M5416 Radiculopathy, lumbar region: Secondary | ICD-10-CM | POA: Diagnosis not present

## 2015-09-20 DIAGNOSIS — M79604 Pain in right leg: Secondary | ICD-10-CM | POA: Diagnosis present

## 2015-09-20 DIAGNOSIS — M5030 Other cervical disc degeneration, unspecified cervical region: Secondary | ICD-10-CM

## 2015-09-20 DIAGNOSIS — M5481 Occipital neuralgia: Secondary | ICD-10-CM

## 2015-09-20 DIAGNOSIS — M51369 Other intervertebral disc degeneration, lumbar region without mention of lumbar back pain or lower extremity pain: Secondary | ICD-10-CM

## 2015-09-20 DIAGNOSIS — M5136 Other intervertebral disc degeneration, lumbar region: Secondary | ICD-10-CM

## 2015-09-20 DIAGNOSIS — M79605 Pain in left leg: Secondary | ICD-10-CM | POA: Diagnosis present

## 2015-09-20 DIAGNOSIS — M791 Myalgia: Secondary | ICD-10-CM | POA: Diagnosis not present

## 2015-09-20 DIAGNOSIS — M48062 Spinal stenosis, lumbar region with neurogenic claudication: Secondary | ICD-10-CM

## 2015-09-20 MED ORDER — FENTANYL 75 MCG/HR TD PT72: MEDICATED_PATCH | TRANSDERMAL | Status: DC

## 2015-09-20 MED ORDER — FENTANYL 100 MCG/HR TD PT72: MEDICATED_PATCH | TRANSDERMAL | Status: DC

## 2015-09-20 MED ORDER — OXYCODONE HCL 10 MG PO TABS: ORAL_TABLET | ORAL | Status: DC

## 2015-09-20 NOTE — Patient Instructions (Addendum)
PLAN   Continue present medication oxycodone and fentanyl patches  Lumbosacral selective nerve root block to be performed at time return appointment  F/U PCP Dr.Sowles  for evaliation of  BP diabetes mellitus and general medical  condition  F/U surgical evaluation. May consider pending follow-up evaluations as discussed  F/U neurological evaluation. May consider [facility] EMG studies and other studies  May consider radiofrequency rhizolysis or intraspinal procedures pending response to present treatment and F/U evaluation   Patient to call [facility] Management Center should patient have concerns prior to scheduled return appointment.Selective Nerve Root Block Patient Information  Description: Specific nerve roots exit the spinal canal and these nerves can be compressed and inflamed by a bulging disc and bone spurs.  By injecting steroids on the nerve root, we can potentially decrease the inflammation surrounding these nerves, which often leads to decreased pain.  Also, by injecting local anesthesia on the nerve root, this can provide [place] helpful information to give to your referring doctor if it decreases your pain.  Selective nerve root blocks can be done along the spine from the neck to the low back depending on the location of your pain.   After numbing the skin with local anesthesia, a small needle is passed to the nerve root and the position of the needle is verified using x-ray pictures.  After the needle is in correct position, we then deposit the medication.  You may experience a pressure sensation while this is being done.  The entire block usually lasts less than 15 minutes.  Conditions that may be treated with selective nerve root blocks:  Low back and leg pain  Spinal stenosis  Diagnostic block prior to potential surgery  Neck and arm pain  Post laminectomy syndrome  Preparation for the injection:  1. Do not eat any solid food or dairy products within 6 hours of your  appointment. 2. You may drink clear liquids up to 2 hours before an appointment.  Clear liquids include water, black coffee, juice or soda.  No milk or cream please. 3. You may take your regular medications, including pain medications, with a sip of water before your appointment.  Diabetics should hold regular insulin (if taken separately) and take 1/2 normal NPH dose the morning of the procedure.  Carry some sugar containing items with you to your appointment. 4. A driver must accompany you and be prepared to drive you home after your procedure. 5. Bring all your current medications with you. 6. An IV may be inserted and sedation may be given at the discretion of the physician. 7. A blood pressure cuff, EKG, and other monitors will often be applied during the procedure.  Some patients may need to have extra oxygen administered for a short period. 8. You will be asked to provide medical information, including allergies, prior to the procedure.  We must know immediately if you are taking blood  Thinners (like Coumadin) or if you are allergic to IV iodine contrast (dye).  Possible side-effects: All are usually temporary  Bleeding from needle site  Light headedness  Numbness and tingling  Decreased blood pressure  Weakness in arms/legs  Pressure sensation in back/neck  Pain at injection site (several days)  Possible complications: All are extremely rare  Infection  Nerve injury  Spinal headache (a headache wore with upright position)  Call if you experience:  Fever/chills associated with headache or increased back/neck pain  Headache worsened by an upright position  New onset weakness or numbness of an  extremity below the injection site  Hives or difficulty breathing (go to the emergency room)  Inflammation or drainage at the injection site(s)  Severe back/neck pain greater than usual  New symptoms which are concerning to you  Please note:  Although the local  anesthetic injected can often make your back or neck feel good for several hours after the injection the pain will likely return.  It takes 3-5 days for steroids to work on the nerve root. You may not notice any pain relief for at least one week.  If effective, we will often do a series of 3 injections spaced 3-6 weeks apart to maximally decrease your pain.    If you have any questions, please call [phone] [facility]

## 2015-09-20 NOTE — Progress Notes (Signed)
Safety precautions to be maintained throughout the outpatient stay will include: orient to surroundings, keep bed in low position, maintain call bell within reach at all times, provide assistance with transfer out of bed and ambulation.  

## 2015-09-20 NOTE — Progress Notes (Signed)
   Subjective:    Patient ID: Lorraine Jones, female    DOB: Aug 11, 1951, 64 y.o.   MRN: [id number]  HPI   the patient is a 64 year old female who returns to pain management Center for further evaluation and treatment of pain involving the region of the lower back and lower extremity region especially the right lower extremity region. The patient is with pain aggravated by standing walking with pain radiating from the lumbar region toward the lower extremity. The patient is with prior surgical intervention of the lumbar region and is also with known degenerative joint disease of the hip. Patient also carries the diagnosis of diabetic neuropathy. We discussed patient's condition and will continue medications consisting of oxycodone and fentanyl patches. Patient wished to proceed with interventional treatment at time return appointment in attempt to decrease severe disabling pain of the lumbar lower extremity region especially the right lower extremity region. We will proceed with lumbosacral selective nerve root block at time return appointment in attempt to decrease severity of patient's symptoms, minimize progression of symptoms, and avoid need for more involved treatment. The patient agreed to suggested treatment plan.     Review of Systems     Objective:   Physical Exam   there was tends of the splenius capitis and occipitalis musculature region of mild degree with mild tenderness over the cervical facet cervical paraspinal musculatures region. There was tends to palpation over  Cervical facet cervical paraspinal musculature region as well as the thoracic facet thoracic paraspinal musculature region of mild to moderate degree. There was tenderness of the acromioclavicular and glenohumeral joint regions of minimal degree. Palpation over the thoracic facet thoracic paraspinal musculature region was without crepitus of the thoracic region. The patient appeared to be with bilaterally equal grip strength  and Tinel and Phalen's maneuver were without increased pain of significant degree. Palpation over the lumbar paraspinal must reason lumbar facet region associated with moderate to moderately severe discomfort. Lateral bending rotation extension and palpation of the lumbar facets reproduced moderately severe discomfort. Straight leg raise was limited to 20 without a definite increased pain with dorsiflexion noted. There appeared to be negative clonus negative Homans. Palpation over the PSIS and PII S region reproduced moderately severe discomfort. There was moderate to moderately severe increase of pain with Patrick's maneuver. There was moderate to moderate tends to palpation of the greater trochanteric region and iliotibial band region. There was questionable decreased sensation of the L5 dermatomal distribution.  There was negative clonus negative HomansAbdomen was nontender with no costovertebral tenderness noted.     Assessment & Plan:    Degenerative disc disease lumbar spine Status post prior surgical intervention lumbar region with L5-S1 scar formation, facet hypertrophy, and diffuse degenerative changes noted throughout the lumbar spine  Lumbar facet syndrome  Lumbar radiculopathy  Sacroiliac joint disease  Diabetic neuropathy    PLAN   Continue present medication oxycodone and fentanyl patches  Lumbosacral selective nerve root block to be performed at time return appointment  F/U PCP Dr.Sowles  for evaliation of  BP diabetes mellitus and general medical  condition  F/U surgical evaluation. May consider pending follow-up evaluations as discussed  F/U neurological evaluation. May consider [facility] EMG studies and other studies  May consider radiofrequency rhizolysis or intraspinal procedures pending response to present treatment and F/U evaluation   Patient to call Pain Management Center should patient have concerns prior to scheduled return appointment.

## 2015-10-16 ENCOUNTER — Ambulatory Visit: Payer: Medicare Other | Attending: Pain Medicine | Admitting: Pain Medicine

## 2015-10-16 ENCOUNTER — Encounter: Payer: Self-pay | Admitting: Pain Medicine

## 2015-10-16 VITALS — BP 114/59 | HR 88 | Temp 99.1°F | Resp 15 | Ht 61.0 in | Wt 197.0 lb

## 2015-10-16 DIAGNOSIS — M47816 Spondylosis without myelopathy or radiculopathy, lumbar region: Secondary | ICD-10-CM | POA: Diagnosis not present

## 2015-10-16 DIAGNOSIS — M5416 Radiculopathy, lumbar region: Secondary | ICD-10-CM | POA: Diagnosis not present

## 2015-10-16 DIAGNOSIS — M5030 Other cervical disc degeneration, unspecified cervical region: Secondary | ICD-10-CM

## 2015-10-16 DIAGNOSIS — K5909 Other constipation: Secondary | ICD-10-CM

## 2015-10-16 DIAGNOSIS — M5481 Occipital neuralgia: Secondary | ICD-10-CM

## 2015-10-16 DIAGNOSIS — M5136 Other intervertebral disc degeneration, lumbar region: Secondary | ICD-10-CM | POA: Diagnosis not present

## 2015-10-16 DIAGNOSIS — M160 Bilateral primary osteoarthritis of hip: Secondary | ICD-10-CM

## 2015-10-16 DIAGNOSIS — M48062 Spinal stenosis, lumbar region with neurogenic claudication: Secondary | ICD-10-CM

## 2015-10-16 DIAGNOSIS — Z9889 Other specified postprocedural states: Secondary | ICD-10-CM | POA: Diagnosis not present

## 2015-10-16 DIAGNOSIS — E1340 Other specified diabetes mellitus with diabetic neuropathy, unspecified: Secondary | ICD-10-CM

## 2015-10-16 DIAGNOSIS — M545 Low back pain: Secondary | ICD-10-CM | POA: Diagnosis not present

## 2015-10-16 DIAGNOSIS — M961 Postlaminectomy syndrome, not elsewhere classified: Secondary | ICD-10-CM

## 2015-10-16 DIAGNOSIS — M533 Sacrococcygeal disorders, not elsewhere classified: Secondary | ICD-10-CM

## 2015-10-16 MED ORDER — MIDAZOLAM HCL 5 MG/5ML IJ SOLN: 5.0000 mg | Freq: Once | INTRAMUSCULAR | Status: DC

## 2015-10-16 MED ORDER — FENTANYL 75 MCG/HR TD PT72: MEDICATED_PATCH | TRANSDERMAL | Status: DC

## 2015-10-16 MED ORDER — BUPIVACAINE HCL (PF) 0.25 % IJ SOLN: 30.0000 mL | Freq: Once | INTRAMUSCULAR | Status: DC

## 2015-10-16 MED ORDER — LIDOCAINE HCL (PF) 1 % IJ SOLN: 10.0000 mL | Freq: Once | INTRAMUSCULAR | Status: DC

## 2015-10-16 MED ORDER — ORPHENADRINE CITRATE 30 MG/ML IJ SOLN: 60.0000 mg | Freq: Once | INTRAMUSCULAR | Status: DC

## 2015-10-16 MED ORDER — OXYCODONE HCL 10 MG PO TABS: ORAL_TABLET | ORAL | Status: DC

## 2015-10-16 MED ORDER — MIDAZOLAM HCL 5 MG/5ML IJ SOLN
INTRAMUSCULAR | Status: AC
  Administered 2015-10-16: 09:00:00
  Filled 2015-10-16: qty 5

## 2015-10-16 MED ORDER — CEFAZOLIN SODIUM 1 G IJ SOLR
INTRAMUSCULAR | Status: AC
Start: 2015-10-16 — End: 2015-10-16
  Administered 2015-10-16: 1 g via INTRAVENOUS
  Filled 2015-10-16: qty 10

## 2015-10-16 MED ORDER — CEFAZOLIN SODIUM 1-5 GM-% IV SOLN: 1.0000 g | Freq: Once | INTRAVENOUS | Status: DC

## 2015-10-16 MED ORDER — FENTANYL CITRATE (PF) 100 MCG/2ML IJ SOLN: 100.0000 ug | Freq: Once | INTRAMUSCULAR | Status: DC

## 2015-10-16 MED ORDER — ORPHENADRINE CITRATE 30 MG/ML IJ SOLN
INTRAMUSCULAR | Status: AC
  Administered 2015-10-16: 09:00:00
  Filled 2015-10-16: qty 2

## 2015-10-16 MED ORDER — FENTANYL 100 MCG/HR TD PT72: MEDICATED_PATCH | TRANSDERMAL | Status: DC

## 2015-10-16 MED ORDER — BUPIVACAINE HCL (PF) 0.25 % IJ SOLN
INTRAMUSCULAR | Status: AC
  Administered 2015-10-16: 09:00:00
  Filled 2015-10-16: qty 30

## 2015-10-16 MED ORDER — TRIAMCINOLONE ACETONIDE 40 MG/ML IJ SUSP: 40.0000 mg | Freq: Once | INTRAMUSCULAR | Status: DC

## 2015-10-16 MED ORDER — FENTANYL CITRATE (PF) 100 MCG/2ML IJ SOLN
INTRAMUSCULAR | Status: AC
  Administered 2015-10-16: 100 ug via INTRAVENOUS
  Filled 2015-10-16: qty 2

## 2015-10-16 MED ORDER — TRIAMCINOLONE ACETONIDE 40 MG/ML IJ SUSP
INTRAMUSCULAR | Status: AC
  Administered 2015-10-16: 09:00:00
  Filled 2015-10-16: qty 1

## 2015-10-16 MED ORDER — CEFUROXIME AXETIL 250 MG PO TABS: 250.0000 mg | ORAL_TABLET | Freq: Two times a day (BID) | ORAL | Status: DC

## 2015-10-16 MED ORDER — LACTATED RINGERS IV SOLN: 1000.0000 mL | INTRAVENOUS | Status: DC

## 2015-10-16 NOTE — Patient Instructions (Addendum)
PLAN  Continue present medication oxycodone and fentanyl patch and begin taking antibiotic Ceftin as prescribed. Please obtain your antibiotic today and begin taking antibiotic today  F/U PCP Dr.  Ancil Boozer for evaliation of  BPdiabetes mellitusd general medical  condition.  F/U surgical evaluation. May consider pending follow-up evaluations  F/U neurological evaluation. May consider pending follow-up evaluations  May consider radiofrequency rhizolysis or intraspinal procedures pending response to present treatment and F/U evaluation.  Patient to call Pain Management Center should patient have concerns prior to scheduled return appointment. Selective Nerve Root Block Patient Information  Description: Specific nerve roots exit the spinal canal and these nerves can be compressed and inflamed by a bulging disc and bone spurs.  By injecting steroids on the nerve root, we can potentially decrease the inflammation surrounding these nerves, which often leads to decreased pain.  Also, by injecting local anesthesia on the nerve root, this can provide [place] helpful information to give to your referring doctor if it decreases your pain.  Selective nerve root blocks can be done along the spine from the neck to the low back depending on the location of your pain.   After numbing the skin with local anesthesia, a small needle is passed to the nerve root and the position of the needle is verified using x-ray pictures.  After the needle is in correct position, we then deposit the medication.  You may experience a pressure sensation while this is being done.  The entire block usually lasts less than 15 minutes.  Conditions that may be treated with selective nerve root blocks:  Low back and leg pain  Spinal stenosis  Diagnostic block prior to potential surgery  Neck and arm pain  Post laminectomy syndrome  Preparation for the injection:  1. Do not eat any solid food or dairy products within 6 hours of your  appointment. 2. You may drink clear liquids up to 2 hours before an appointment.  Clear liquids include water, black coffee, juice or soda.  No milk or cream please. 3. You may take your regular medications, including pain medications, with a sip of water before your appointment.  Diabetics should hold regular insulin (if taken separately) and take 1/2 normal NPH dose the morning of the procedure.  Carry some sugar containing items with you to your appointment. 4. A driver must accompany you and be prepared to drive you home after your procedure. 5. Bring all your current medications with you. 6. An IV may be inserted and sedation may be given at the discretion of the physician. 7. A blood pressure cuff, EKG, and other monitors will often be applied during the procedure.  Some patients may need to have extra oxygen administered for a short period. 8. You will be asked to provide medical information, including allergies, prior to the procedure.  We must know immediately if you are taking blood  Thinners (like Coumadin) or if you are allergic to IV iodine contrast (dye).  Possible side-effects: All are usually temporary  Bleeding from needle site  Light headedness  Numbness and tingling  Decreased blood pressure  Weakness in arms/legs  Pressure sensation in back/neck  Pain at injection site (several days)  Possible complications: All are extremely rare  Infection  Nerve injury  Spinal headache (a headache wore with upright position)  Call if you experience:  Fever/chills associated with headache or increased back/neck pain  Headache worsened by an upright position  New onset weakness or numbness of an extremity below the  injection site  Hives or difficulty breathing (go to the emergency room)  Inflammation or drainage at the injection site(s)  Severe back/neck pain greater than usual  New symptoms which are concerning to you  Please note:  Although the local  anesthetic injected can often make your back or neck feel good for several hours after the injection the pain will likely return.  It takes 3-5 days for steroids to work on the nerve root. You may not notice any pain relief for at least one week.  If effective, we will often do a series of 3 injections spaced 3-6 weeks apart to maximally decrease your pain.    If you have any questions, please call [phone] [facility]  General Discharge Instructions :  If you need to reach your doctor call: Monday-Friday 8:00 am - 4:00 pm at [phone] or toll free [phone].  After clinic hours [phone] to have operator reach doctor.  Bring all of your medication bottles to all your appointments in the pain clinic.  To cancel or reschedule your appointment with Pain Management please remember to call 24 hours in advance to avoid a fee.  Refer to the educational materials which you have been given on: General Risks, I had my Procedure. Discharge Instructions, Post Sedation.  Post Procedure Instructions:  The drugs you were given will stay in your system until tomorrow, so for the next 24 hours you should not drive, make any legal decisions or drink any alcoholic beverages.  You may eat anything you prefer, but it is better to start with liquids then soups and crackers, and gradually work up to solid foods.  Please notify your doctor immediately if you have any unusual bleeding, trouble breathing or pain that is not related to your normal pain.  Depending on the type of procedure that was done, some parts of your body may feel week and/or numb.  This usually clears up by tonight or the next day.  Walk with the use of an assistive device or accompanied by an adult for the 24 hours.  You may use ice on the affected area for the first 24 hours.  Put ice in a Ziploc bag and cover with a towel and place against area 15  minutes on 15 minutes off.  You may switch to heat after 24 hours.

## 2015-10-16 NOTE — Progress Notes (Signed)
Safety precautions to be maintained throughout the outpatient stay will include: orient to surroundings, keep bed in low position, maintain call bell within reach at all times, provide assistance with transfer out of bed and ambulation.  

## 2015-10-16 NOTE — Progress Notes (Signed)
Subjective:    Patient ID: Lorraine Jones, female    DOB: 07-12-1951, 65 y.o.   MRN: [id number]  HPI PROCEDURE PERFORMED: Lumbosacral selective nerve root block   NOTE: The patient is a 65 y.o. female who returns to [place] for further evaluation and treatment of pain involving the lumbar and lower extremity region. Studies consisting of MRI has revealed the patient to be with evidence of  Degenerative disc disease lumbar spine Status post prior surgical intervention lumbar region with L5-S1 scar formation, facet hypertrophy, and diffuse degenerative changes noted throughout the lumbar spine The patient also is with known degenerative joint disease of the hips. . There is concern regarding patient being with significant component of pain due to lumbar radiculopathy as well as concern regarding diabetic neuropathy  The risks, benefits, and expectations of the procedure have been explained to the patient who was understanding and in agreement with suggested treatment plan. We will proceed with interventional treatment as discussed and as explained to the patient. The patient is understanding and in agreement with suggested treatment plan.   DESCRIPTION OF PROCEDURE: Lumbosacral selective nerve root block with IV Versed, IV fentanyl conscious sedation, EKG, blood pressure, pulse, and pulse oximetry monitoring. The procedure was performed with the patient in the prone position under fluoroscopic guidance. With the patient in the prone position, Betadine prep of proposed entry site was performed. Local anesthetic skin wheal of proposed needle entry site was prepared with 1.5% plain lidocaine with AP view of the lumbosacral spine.   PROCEDURE #1: Needle placement at the right L 2 vertebral body: A 22 -gauge needle was inserted at the inferior border of the transverse process of the vertebral body with needle placed medial to the midline of the transverse process on AP view of the lumbosacral  spine.   NEEDLE PLACEMENT AT  L3, L4, and L5  VERTEBRAL BODY LEVELS  Needle  placement was accomplished at L3, L4, and L5  vertebral body levels on the right side exactly as was accomplished at the L2  vertebral body level  and utilizing the same technique and under fluoroscopic guidance.    Needle placement was then verified on lateral view at all levels with needle tip documented to be in the posterior superior quadrant of the intervertebral foramen of  L 2, L3, L4, and L5. Following negative aspiration for heme and CSF at each level, each level was injected with 3 mL of 0.25% bupivacaine with Kenalog.   The patient tolerated the procedure well. A total of 10 mg of Kenalog was utilized for the procedure.   PLAN:  1. Medications: Will continue presently prescribed medications oxycodone and fentanyl patches. 2. The patient is to undergo follow-up evaluation with PCP Dr. Lucita Lora for evaluation of blood pressure and general medical condition status post procedure performed on today's visit. 3. Surgical follow-up evaluation. Has been addressed 4. Neurological evaluation. May consider [facility] EMG studies 5. May consider radiofrequency procedures, implantation type procedures and other treatment pending response to treatment and follow-up evaluation. 6. The patient has been advise do adhere to proper body mechanics and avoid activities which may aggravate condition. 7. The patient has been advised to call the Pain Management Center prior to scheduled return appointment should there be significant change in the patient's condition or should the patient have other concerns regarding condition prior to scheduled return appointment.   Review of Systems     Objective:   Physical Exam  Assessment & Plan:

## 2015-10-17 ENCOUNTER — Telehealth: Payer: Self-pay | Admitting: *Deleted

## 2015-10-17 NOTE — Telephone Encounter (Signed)
Denies complications post procedure. 

## 2015-10-19 ENCOUNTER — Ambulatory Visit: Payer: Medicare Other | Admitting: Family Medicine

## 2015-10-21 ENCOUNTER — Other Ambulatory Visit: Payer: Self-pay | Admitting: Family Medicine

## 2015-11-01 ENCOUNTER — Encounter: Payer: Self-pay | Admitting: Family Medicine

## 2015-11-01 ENCOUNTER — Ambulatory Visit (INDEPENDENT_AMBULATORY_CARE_PROVIDER_SITE_OTHER): Payer: Medicare Other | Admitting: Family Medicine

## 2015-11-01 ENCOUNTER — Telehealth: Payer: Self-pay

## 2015-11-01 VITALS — BP 122/68 | HR 113 | Temp 98.3°F | Resp 18 | Wt 199.1 lb

## 2015-11-01 DIAGNOSIS — I10 Essential (primary) hypertension: Secondary | ICD-10-CM

## 2015-11-01 DIAGNOSIS — G8929 Other chronic pain: Secondary | ICD-10-CM | POA: Diagnosis not present

## 2015-11-01 DIAGNOSIS — E785 Hyperlipidemia, unspecified: Secondary | ICD-10-CM | POA: Diagnosis not present

## 2015-11-01 DIAGNOSIS — E1143 Type 2 diabetes mellitus with diabetic autonomic (poly)neuropathy: Secondary | ICD-10-CM

## 2015-11-01 DIAGNOSIS — IMO0002 Reserved for concepts with insufficient information to code with codable children: Secondary | ICD-10-CM

## 2015-11-01 DIAGNOSIS — E1165 Type 2 diabetes mellitus with hyperglycemia: Secondary | ICD-10-CM

## 2015-11-01 LAB — POCT GLYCOSYLATED HEMOGLOBIN (HGB A1C): Hemoglobin A1C: 10.5

## 2015-11-01 MED ORDER — METFORMIN HCL 1000 MG PO TABS: 1000.0000 mg | ORAL_TABLET | Freq: Two times a day (BID) | ORAL | Status: AC

## 2015-11-01 MED ORDER — CARVEDILOL 3.125 MG PO TABS: 3.1250 mg | ORAL_TABLET | Freq: Two times a day (BID) | ORAL | Status: DC

## 2015-11-01 MED ORDER — PIOGLITAZONE HCL 15 MG PO TABS: 15.0000 mg | ORAL_TABLET | Freq: Every day | ORAL | Status: DC

## 2015-11-01 MED ORDER — METOCLOPRAMIDE HCL 5 MG PO TABS: 5.0000 mg | ORAL_TABLET | Freq: Three times a day (TID) | ORAL | Status: DC

## 2015-11-01 MED ORDER — PRAVASTATIN SODIUM 40 MG PO TABS: 40.0000 mg | ORAL_TABLET | Freq: Every day | ORAL | Status: DC

## 2015-11-01 MED ORDER — OLMESARTAN-AMLODIPINE-HCTZ 40-10-25 MG PO TABS: 1.0000 | ORAL_TABLET | Freq: Every day | ORAL | Status: DC

## 2015-11-01 NOTE — Telephone Encounter (Signed)
Please call pharmacy and request transfer, or she can call [facility] and ask them to request it to be transferred to them.

## 2015-11-01 NOTE — Progress Notes (Signed)
Name: Lorraine Jones   MRN: [id number]    DOB: [birth year]   Date:11/01/2015       Progress Note  Subjective  Chief Complaint  Chief Complaint  Patient presents with  . Diabetes    patient stated she is here for her recheck of her sugar. highest 300 lowest 180. she does not check it everyday.    HPI  DMII with neuropathy and gastroparesis: she is now on Pioglitazone, Metformin and Novolog 24 units before meals and Lantus 60 units daily. She states she has been compliant with her diet and medications, not checking fsbs very often. She did not bring her meter again today.  She denies polydipsia, polyuria or polyphagia. She is still getting steroid injections from the pain clinic, and states when she is in a lot of pain her glucose gets even higher. She is taking statins and aspirin daily also on ARB. She states her neuropathy is under control at this time, and indigestion is under control with Reglan before meals. No longer takes PPI  HTN: she needs refill of Tribenzor and carvedilol, she is compliant with medication and bp is at goal. No chest pain or palpitation.  Chronic Pain: she sees Dr. Primus Bravo at least once a month, taking pain medication as prescribed, she has constipation secondary to narcotics and is taking Miralax to control symptoms, she does not want to switch to Linzess of [place]mit[place] at this time  Hyperlipidemia: taking Pravastatin and denies side effects.     Patient Active Problem List   Diagnosis Date Noted  . Gastroesophageal reflux disease without esophagitis 05/12/2015  . Chronic pain 05/12/2015  . Hyperlipidemia 05/12/2015  . DDD (degenerative disc disease), lumbar 02/27/2015  . Lumbar post-laminectomy syndrome 02/27/2015  . Neuropathy due to secondary diabetes ([place]) 02/27/2015  . Spinal stenosis, lumbar region, with neurogenic claudication 02/27/2015  . Sacroiliac joint dysfunction of both sides 02/27/2015  . Degenerative joint disease (DJD) of hip 02/27/2015  . DDD  (degenerative disc disease), cervical 02/27/2015  . Bilateral occipital neuralgia 02/27/2015  . Chronic constipation 06/02/2014  . Hyponatremia 06/01/2014  . Hypertension, benign 06/01/2014    Past Surgical History  Procedure Laterality Date  . Cholecystectomy  1996  . Lumbar disc surgery  1993    L4  --  L5  . Cystoscopy with ureteroscopy Right 09/01/2013    Procedure: CYSTOSCOPY WITH UNROOFING  RIGHT URETEROCELE AND URETERAL STONE REMOVED  WITH GYRUS COLLINS KNIFE. ;  Surgeon: Irine Seal, MD;  Location: [facility];  Service: Urology;  Laterality: Right;  cysto, right uretersoscopy and stone extraction   collins knife    Family History  Problem Relation Age of Onset  . Diabetes Mother   . Heart disease Mother   . Hypertension Mother     Social History   Social History  . Marital Status: Married    Spouse Name: N/A  . Number of Children: N/A  . Years of Education: N/A   Occupational History  . Not on file.   Social History Main Topics  . Smoking status: Current Every Day Smoker -- 0.50 packs/day for 12 years    Types: Cigarettes  . Smokeless tobacco: Never Used  . Alcohol Use: No  . Drug Use: No  . Sexual Activity: Yes   Other Topics Concern  . Not on file   Social History Narrative     Current outpatient prescriptions:  .  aspirin EC 81 MG tablet, Take 81 mg by mouth daily., Disp: ,  Rfl:  .  carvedilol (COREG) 3.125 MG tablet, Take 1 tablet (3.125 mg total) by mouth 2 (two) times daily with a meal., Disp: 180 tablet, Rfl: 1 .  cefUROXime (CEFTIN) 250 MG tablet, Take 1 tablet (250 mg total) by mouth 2 (two) times daily with a meal., Disp: 14 tablet, Rfl: 0 .  dorzolamide-timolol (COSOPT) 22.3-6.8 MG/ML ophthalmic solution, Place 1 drop into both eyes 2 (two) times daily., Disp: , Rfl:  .  fentaNYL (DURAGESIC - DOSED MCG/HR) 100 MCG/HR, Apply  1 patch to skin every 2 days if tolerated. The patch will be applied with a 75 g per hour fentanyl  patch, Disp: 15 patch, Rfl: 0 .  fentaNYL (DURAGESIC - DOSED MCG/HR) 75 MCG/HR, Apply 1 patch to skin every 2 days if tolerated. The patch will be applied with a 100 g per hour fentanyl patch, Disp: 15 patch, Rfl: Seroquel .  Insulin Lispro (HUMALOG KWIKPEN) 200 UNIT/ML SOPN, Inject 24 Units into the skin 3 (three) times daily before meals., Disp: 45 mL, Rfl: 1 .  LANTUS SOLOSTAR 100 UNIT/ML Solostar Pen, INJECT 50 UNITS INTO THE SKIN DAILY AT 10 PM., Disp: 15 pen, Rfl: 5 .  metFORMIN (GLUCOPHAGE) 1000 MG tablet, Take 1 tablet (1,000 mg total) by mouth 2 (two) times daily with a meal., Disp: 180 tablet, Rfl: 1 .  metoCLOPramide (REGLAN) 5 MG tablet, Take 1 tablet (5 mg total) by mouth 3 (three) times daily., Disp: 270 tablet, Rfl: 1 .  Multiple Vitamins-Minerals (MULTIVITAMIN WITH MINERALS) tablet, Take 1 tablet by mouth daily., Disp: , Rfl:  .  Olmesartan-Amlodipine-HCTZ (TRIBENZOR) 40-10-25 MG TABS, Take 1 tablet by mouth daily., Disp: 90 tablet, Rfl: 1 .  Oxycodone HCl 10 MG TABS, Limit 1 tab by mouth 4-6 times per day if tolerated, Disp: 150 tablet, Rfl: 0 .  pioglitazone (ACTOS) 15 MG tablet, Take 1 tablet (15 mg total) by mouth daily., Disp: 90 tablet, Rfl: 1 .  polyethylene glycol (MIRALAX / GLYCOLAX) packet, Take 17 g by mouth daily., Disp: 14 each, Rfl: 0 .  pravastatin (PRAVACHOL) 40 MG tablet, Take 1 tablet (40 mg total) by mouth daily., Disp: 90 tablet, Rfl: 1  Current facility-administered medications:  .  bupivacaine (PF) (MARCAINE) 0.25 % injection 30 mL, 30 mL, Other, Once, Mohammed Kindle, MD .  ceFAZolin (ANCEF) IVPB 1 g/50 mL premix, 1 g, Intravenous, Once, Mohammed Kindle, MD .  fentaNYL (SUBLIMAZE) injection 100 mcg, 100 mcg, Intravenous, Once, Mohammed Kindle, MD .  lactated ringers infusion 1,000 mL, 1,000 mL, Intravenous, Continuous, Mohammed Kindle, MD .  lidocaine (PF) (XYLOCAINE) 1 % injection 10 mL, 10 mL, Subcutaneous, Once, Mohammed Kindle, MD .  midazolam (VERSED) 5 MG/5ML  injection 5 mg, 5 mg, Intravenous, Once, Mohammed Kindle, MD .  orphenadrine (NORFLEX) injection 60 mg, 60 mg, Intramuscular, Once, Mohammed Kindle, MD .  triamcinolone acetonide (KENALOG-40) injection 40 mg, 40 mg, Other, Once, Mohammed Kindle, MD  Allergies  Allergen Reactions  . Diclofenac Sodium Swelling  . Lodine [Etodolac] Swelling  . Naproxen Sodium Swelling  . Neurontin [Gabapentin] Swelling     ROS  Constitutional: Negative for fever or significant  weight change.  Respiratory: Negative for cough and shortness of breath.   Cardiovascular: Negative for chest pain or palpitations.  Gastrointestinal: Negative for abdominal pain, no bowel changes ( still has constipation).  Musculoskeletal: Negative for gait problem or joint swelling.  Skin: Negative for rash.  Neurological: Negative for dizziness or headache.  No other specific complaints  in a complete review of systems (except as listed in HPI above).  Objective  Filed Vitals:   11/01/15 0902  BP: 122/68  Pulse: 113  Temp: 98.3 F (36.8 C)  TempSrc: Oral  Resp: 18  Weight: 199 lb 1.6 oz (90.311 kg)  SpO2: 94%    Body mass index is 37.64 kg/(m^2).  Physical Exam  Constitutional: Patient appears well-developed and well-nourished. Obese  No distress.  HEENT: head atraumatic, normocephalic, pupils equal and reactive to light,  neck supple, throat within normal limits Cardiovascular: Normal rate, regular rhythm and normal heart sounds.  No murmur heard. No BLE edema. Pulmonary/Chest: Effort normal and breath sounds normal. No respiratory distress. Abdominal: Soft.  There is no tenderness. Psychiatric: Patient has a normal mood and affect. behavior is normal. Judgment and thought content normal. Muscular skeletal: she has low back pain during palpation with positive right straight leg raise  Recent Results (from the past 2160 hour(s))  POCT UA - Microalbumin     Status: None   Collection Time: 09/07/15 11:53 AM  Result  Value Ref Range   Microalbumin Ur, POC 20 mg/L   Creatinine, POC  mg/dL   Albumin/Creatinine Ratio, Urine, POC    POCT glycosylated hemoglobin (Hb A1C)     Status: Abnormal   Collection Time: 11/01/15  9:26 AM  Result Value Ref Range   Hemoglobin A1C 10.5      PHQ2/9: Depression screen [facility] 2/9 11/01/2015 09/20/2015 08/22/2015 07/26/2015 06/29/2015  Decreased Interest 0 0 0 0 0  Down, Depressed, Hopeless 1 0 0 0 0  PHQ - 2 Score 1 0 0 0 0  Altered sleeping - - - - -  Tired, decreased energy - - - - -  Change in appetite - - - - -  Feeling bad or failure about yourself  - - - - -  Trouble concentrating - - - - -  Moving slowly or fidgety/restless - - - - -  Suicidal thoughts - - - - -  PHQ-9 Score - - - - -  Difficult doing work/chores - - - - -     Fall Risk: Fall Risk  11/01/2015 10/16/2015 09/20/2015 08/22/2015 07/26/2015  Falls in the past year? No No No (No Data) No  Risk for fall due to : Impaired mobility - - - -     Functional Status Survey: Is the patient deaf or have difficulty hearing?: No Does the patient have difficulty seeing, even when wearing glasses/contacts?: No (patient wears glasses) Does the patient have difficulty concentrating, remembering, or making decisions?: No Does the patient have difficulty walking or climbing stairs?: Yes Does the patient have difficulty dressing or bathing?: No Does the patient have difficulty doing errands alone such as visiting a doctor's office or shopping?: No    Assessment & Plan  1. 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  Dispense: 90 tablet; Refill: 1 - metFORMIN (GLUCOPHAGE) 1000 MG tablet; Take 1 tablet (1,000 mg total) by mouth 2 (two) times daily with a meal.  Dispense: 180 tablet; Refill: 1 -referral Endocrinologist. Her hgbA1C has remained above 10.   2.  Hypertension, benign  - Olmesartan-Amlodipine-HCTZ (TRIBENZOR) 40-10-25 MG TABS; Take 1 tablet by mouth daily.  Dispense: 90 tablet; Refill: 1 - carvedilol (COREG) 3.125 MG tablet; Take 1 tablet (3.125 mg total) by mouth 2 (two) times  daily with a meal.  Dispense: 180 tablet; Refill: 1  3. Hyperlipidemia  - pravastatin (PRAVACHOL) 40 MG tablet; Take 1 tablet (40 mg total) by mouth daily.  Dispense: 90 tablet; Refill: 1  4. Chronic pain  Continue follow up with Dr. Primus Bravo  5. Type 2 diabetes, uncontrolled, with gastroparesis (HCC)  - metoCLOPramide (REGLAN) 5 MG tablet; Take 1 tablet (5 mg total) by mouth 3 (three) times daily.  Dispense: 270 tablet; Refill: 1

## 2015-11-01 NOTE — Telephone Encounter (Signed)
Patient uses 2 pharmacies and stated that she needed the meds she received today to be sent to [facility] instead of [facility] since it is cheaper there.

## 2015-11-02 NOTE — Telephone Encounter (Signed)
A message was left for the pharmacist at [facility] requesting that they transfer all rx that was sent in yesterday to [address].

## 2015-11-14 ENCOUNTER — Ambulatory Visit: Payer: Medicare Other | Attending: Pain Medicine | Admitting: Pain Medicine

## 2015-11-14 ENCOUNTER — Encounter: Payer: Self-pay | Admitting: Pain Medicine

## 2015-11-14 VITALS — BP 158/72 | HR 91 | Temp 98.6°F | Resp 15 | Ht 61.0 in | Wt 196.0 lb

## 2015-11-14 DIAGNOSIS — M48062 Spinal stenosis, lumbar region with neurogenic claudication: Secondary | ICD-10-CM

## 2015-11-14 DIAGNOSIS — Z9889 Other specified postprocedural states: Secondary | ICD-10-CM | POA: Insufficient documentation

## 2015-11-14 DIAGNOSIS — M47896 Other spondylosis, lumbar region: Secondary | ICD-10-CM | POA: Insufficient documentation

## 2015-11-14 DIAGNOSIS — E1140 Type 2 diabetes mellitus with diabetic neuropathy, unspecified: Secondary | ICD-10-CM | POA: Diagnosis not present

## 2015-11-14 DIAGNOSIS — E1340 Other specified diabetes mellitus with diabetic neuropathy, unspecified: Secondary | ICD-10-CM

## 2015-11-14 DIAGNOSIS — M545 Low back pain: Secondary | ICD-10-CM | POA: Diagnosis present

## 2015-11-14 DIAGNOSIS — M961 Postlaminectomy syndrome, not elsewhere classified: Secondary | ICD-10-CM

## 2015-11-14 DIAGNOSIS — M5030 Other cervical disc degeneration, unspecified cervical region: Secondary | ICD-10-CM

## 2015-11-14 DIAGNOSIS — M5136 Other intervertebral disc degeneration, lumbar region: Secondary | ICD-10-CM

## 2015-11-14 DIAGNOSIS — M160 Bilateral primary osteoarthritis of hip: Secondary | ICD-10-CM

## 2015-11-14 DIAGNOSIS — M5116 Intervertebral disc disorders with radiculopathy, lumbar region: Secondary | ICD-10-CM | POA: Diagnosis not present

## 2015-11-14 DIAGNOSIS — M79606 Pain in leg, unspecified: Secondary | ICD-10-CM | POA: Diagnosis present

## 2015-11-14 DIAGNOSIS — G8929 Other chronic pain: Secondary | ICD-10-CM | POA: Diagnosis not present

## 2015-11-14 DIAGNOSIS — F1190 Opioid use, unspecified, uncomplicated: Secondary | ICD-10-CM | POA: Diagnosis not present

## 2015-11-14 DIAGNOSIS — Z5181 Encounter for therapeutic drug level monitoring: Secondary | ICD-10-CM | POA: Diagnosis not present

## 2015-11-14 DIAGNOSIS — M791 Myalgia: Secondary | ICD-10-CM | POA: Diagnosis not present

## 2015-11-14 DIAGNOSIS — Z79891 Long term (current) use of opiate analgesic: Secondary | ICD-10-CM | POA: Diagnosis not present

## 2015-11-14 DIAGNOSIS — M5416 Radiculopathy, lumbar region: Secondary | ICD-10-CM | POA: Diagnosis not present

## 2015-11-14 DIAGNOSIS — M5481 Occipital neuralgia: Secondary | ICD-10-CM

## 2015-11-14 DIAGNOSIS — M533 Sacrococcygeal disorders, not elsewhere classified: Secondary | ICD-10-CM

## 2015-11-14 DIAGNOSIS — M47817 Spondylosis without myelopathy or radiculopathy, lumbosacral region: Secondary | ICD-10-CM | POA: Diagnosis not present

## 2015-11-14 MED ORDER — FENTANYL 75 MCG/HR TD PT72: MEDICATED_PATCH | TRANSDERMAL | Status: DC

## 2015-11-14 MED ORDER — OXYCODONE HCL 10 MG PO TABS: ORAL_TABLET | ORAL | Status: DC

## 2015-11-14 NOTE — Progress Notes (Signed)
Safety precautions to be maintained throughout the outpatient stay will include: orient to surroundings, keep bed in low position, maintain call bell within reach at all times, provide assistance with transfer out of bed and ambulation.  

## 2015-11-14 NOTE — Patient Instructions (Addendum)
PLAN   Continue present medication oxycodone and fentanyl patches.  NOTE: Do not apply 100 g per hour fentanyl patch since insurance will not approve the 100 g per hour fentanyl patch for you  Lumbar sympathetic block to be performed at time of return appointment   F/U PCP Dr.Sowles  for evaliation of  BP diabetes mellitus and general medical  condition  F/U surgical evaluation. May consider pending follow-up evaluations as discussed  F/U neurological evaluation. May consider [facility] EMG studies and other studies  May consider radiofrequency rhizolysis or intraspinal procedures pending response to present treatment and F/U evaluation   Patient to call [facility] should patient have concerns prior to scheduled return appointment.Lumbar Sympathetic Block Patient Information  Description: The lumbar plexus is a group of nerves that are part of the sympathetic nervous system.  These nerves supply organs in the pelvis and legs.  Lumbar sympathetic blocks are utilized for the diagnosis and treatment of painful conditions in these areas.   The lumbar plexus is located on both sides of the aorta at approximately the level of the second lumbar vertebral body.  The block will be performed with you lying on your abdomen with a pillow underneath.  Using direct x-ray guidance,   The plexus will be located on both sides of the spine.  Numbing medicine will be used to deaden the skin prior to needle insertion.  In most cases, a small amount of sedation can be give by IV prior to the numbing medicine.  One or two small needles will be placed near the plexus and local anesthetic will be injected.  This may make your leg(s) feel warm.  The Entire block usually lasts about 15-25 minutes.  Conditions which may be treated by lumbar sympathetic block:   Reflex sympathetic dystrophy  Phantom limb pain  Peripheral neuropathy  Peripheral vascular disease ( inadequate blood flow )  Cancer pain of  pelvis, leg and kidney  Preparation for the injection:  1. Do note eat any solid food or diary products within 6 hours of your appointment. 2. You may drink clear liquids up to 2 hours before appointment.  Clear liquids include water, black coffee, juice or soda.  No milk or cream please. 3. You may take your regular medication, including pain medications, with a sip of water before you appointment.  Diabetics should hold regular insulin ( if taken separately ) and take 1/2 NPH dose the morning of the procedure .  Carry some sugar containing items with you to your appointment. 4. A driver must accompany you and be prepared to drive you home after your procedure. 5. Bring all your current medication with you. 6. An IV may be inserted and sedation may be given at the discretion of the physician.  7. A blood pressure cuff, EKG and other monitors will often be applied during the procedure.  Some patients may need to have extra oxygen administered for a short period. 8. You will be asked to provide medical information, including your allergies and medications, prior to the procedure.  We must know immediately if your taking blood thinners (like Coumadin/Warfarin) or if you are allergic to IV iodine contrast (dye).  We must know if you could possibly be pregnant.  Possible side-effects   Bleeding from needle site or deeper  Infection (rare, can require surgery)  Nerve injury (rare)  Numbness & tingling (temporary)  Collapsed lung (rare)  Spinal headache (a headache worse with upright posture)  Light-headedness (temporary)  Pain at injection site (several days)  Decreased blood pressure (temporary)  Weakness in legs (temporary)  Seizure or other drug reaction (rare)  Call if you experience:   Fever/chills associated with headache or increased back/ neck pain  Headache worsened by an upright position  New onset weakness or numbness of an extremity below the injection  site  Hives or difficulty breathing ( go to the emergency room)  Inflammation or drainage at the injections site(s)  New symptoms which are concerning to you  Please note:  If effective, we will often do a series of 2-3 injections spaced 3-6 weeks apart to maximally decrease your pain.  If initial series is effective, you may be a candidate for a more permanent block of the lumbar sympathetic plexus.  If you have any questions please call [phone] [facility] GENERAL RISKS AND COMPLICATIONS  What are the risk, side effects and possible complications? Generally speaking, most procedures are safe.  However, with any procedure there are risks, side effects, and the possibility of complications.  The risks and complications are dependent upon the sites that are lesioned, or the type of nerve block to be performed.  The closer the procedure is to the spine, the more serious the risks are.  Great care is taken when placing the radio frequency needles, block needles or lesioning probes, but sometimes complications can occur. 1. Infection: Any time there is an injection through the skin, there is a risk of infection.  This is why sterile conditions are used for these blocks.  There are four possible types of infection. 1. Localized skin infection. 2. Central Nervous System Infection-This can be in the form of Meningitis, which can be deadly. 3. Epidural Infections-This can be in the form of an epidural abscess, which can cause pressure inside of the spine, causing compression of the spinal cord with subsequent paralysis. This would require an emergency surgery to decompress, and there are no guarantees that the patient would recover from the paralysis. 4. Discitis-This is an infection of the intervertebral discs.  It occurs in about 1% of discography procedures.  It is difficult to treat and it may lead to surgery.        2. Pain: the needles have to go through  skin and soft tissues, will cause soreness.       3. Damage to internal structures:  The nerves to be lesioned may be near blood vessels or    other nerves which can be potentially damaged.       4. Bleeding: Bleeding is more common if the patient is taking blood thinners such as  aspirin, Coumadin, Ticiid, Plavix, etc., or if he/she have some genetic predisposition  such as hemophilia. Bleeding into the spinal canal can cause compression of the spinal  cord with subsequent paralysis.  This would require an emergency surgery to  decompress and there are no guarantees that the patient would recover from the  paralysis.       5. Pneumothorax:  Puncturing of a lung is a possibility, every time a needle is introduced in  the area of the chest or upper back.  Pneumothorax refers to free air around the  collapsed lung(s), inside of the thoracic cavity (chest cavity).  Another two possible  complications related to a similar event would include: Hemothorax and Chylothorax.   These are variations of the Pneumothorax, where instead of air around the collapsed  lung(s), you may have blood or chyle, respectively.  6. Spinal headaches: They may occur with any procedures in the area of the spine.       7. Persistent CSF (Cerebro-Spinal Fluid) leakage: This is a rare problem, but may occur  with prolonged intrathecal or epidural catheters either due to the formation of a fistulous  track or a dural tear.       8. Nerve damage: By working so close to the spinal cord, there is always a possibility of  nerve damage, which could be as serious as a permanent spinal cord injury with  paralysis.       9. Death:  Although rare, severe deadly allergic reactions known as "Anaphylactic  reaction" can occur to any of the medications used.      10. Worsening of the symptoms:  We can always make thing worse.  What are the chances of something like this happening? Chances of any of this occuring are extremely low.  By  statistics, you have more of a chance of getting killed in a motor vehicle accident: while driving to the hospital than any of the above occurring .  Nevertheless, you should be aware that they are possibilities.  In general, it is similar to taking a shower.  Everybody knows that you can slip, hit your head and get killed.  Does that mean that you should not shower again?  Nevertheless always keep in mind that statistics do not mean anything if you happen to be on the wrong side of them.  Even if a procedure has a 1 (one) in a 1,000,000 (million) chance of going wrong, it you happen to be that one..Also, keep in mind that by statistics, you have more of a chance of having something go wrong when taking medications.  Who should not have this procedure? If you are on a blood thinning medication (e.g. Coumadin, Plavix, see list of "Blood Thinners"), or if you have an active infection going on, you should not have the procedure.  If you are taking any blood thinners, please inform your physician.  How should I prepare for this procedure?  Do not eat or drink anything at least six hours prior to the procedure.  Bring a driver with you .  It cannot be a taxi.  Come accompanied by an adult that can drive you back, and that is strong enough to help you if your legs get weak or numb from the local anesthetic.  Take all of your medicines the morning of the procedure with just enough water to swallow them.  If you have diabetes, make sure that you are scheduled to have your procedure done first thing in the morning, whenever possible.  If you have diabetes, take only half of your insulin dose and notify our nurse that you have done so as soon as you arrive at the clinic.  If you are diabetic, but only take blood sugar pills (oral hypoglycemic), then do not take them on the morning of your procedure.  You may take them after you have had the procedure.  Do not take aspirin or any aspirin-containing  medications, at least eleven (11) days prior to the procedure.  They may prolong bleeding.  Wear loose fitting clothing that may be easy to take off and that you would not mind if it got stained with Betadine or blood.  Do not wear any jewelry or perfume  Remove any nail coloring.  It will interfere with some of our monitoring equipment.  NOTE: Remember that this is  not meant to be interpreted as a complete list of all possible complications.  Unforeseen problems may occur.  BLOOD THINNERS The following drugs contain aspirin or other products, which can cause increased bleeding during surgery and should not be taken for 2 weeks prior to and 1 week after surgery.  If you should need take something for relief of minor pain, you may take acetaminophen which is found in Tylenol,m Datril, Anacin-3 and Panadol. It is not blood thinner. The products listed below are.  Do not take any of the products listed below in addition to any listed on your instruction sheet.  A.P.C or A.P.C with Codeine Codeine Phosphate Capsules #3 Ibuprofen Ridaura  ABC compound Congesprin Imuran rimadil  Advil Cope Indocin Robaxisal  Alka-Seltzer Effervescent Pain Reliever and Antacid Coricidin or Coricidin-D  Indomethacin Rufen  Alka-Seltzer plus Cold Medicine Cosprin Ketoprofen S-A-C Tablets  Anacin Analgesic Tablets or Capsules Coumadin Korlgesic Salflex  Anacin Extra Strength Analgesic tablets or capsules CP-2 Tablets Lanoril Salicylate  Anaprox Cuprimine Capsules Levenox Salocol  Anexsia-D Dalteparin Magan Salsalate  Anodynos Darvon compound Magnesium Salicylate Sine-off  Ansaid Dasin Capsules Magsal Sodium Salicylate  Anturane Depen Capsules Marnal Soma  APF Arthritis pain formula Dewitt's Pills Measurin Stanback  Argesic Dia-Gesic Meclofenamic Sulfinpyrazone  Arthritis Bayer Timed Release Aspirin Diclofenac Meclomen Sulindac  Arthritis pain formula Anacin Dicumarol Medipren Supac  Analgesic (Safety coated)  Arthralgen Diffunasal Mefanamic Suprofen  Arthritis Strength Bufferin Dihydrocodeine Mepro Compound Suprol  Arthropan liquid Dopirydamole Methcarbomol with Aspirin Synalgos  ASA tablets/Enseals Disalcid Micrainin Tagament  Ascriptin Doan's Midol Talwin  Ascriptin A/D Dolene Mobidin Tanderil  Ascriptin Extra Strength Dolobid Moblgesic Ticlid  Ascriptin with Codeine Doloprin or Doloprin with Codeine Momentum Tolectin  Asperbuf Duoprin Mono-gesic Trendar  Aspergum Duradyne Motrin or Motrin IB Triminicin  Aspirin plain, buffered or enteric coated Durasal Myochrisine Trigesic  Aspirin Suppositories Easprin Nalfon Trillsate  Aspirin with Codeine Ecotrin Regular or Extra Strength Naprosyn Uracel  Atromid-S Efficin Naproxen Ursinus  Auranofin Capsules Elmiron Neocylate Vanquish  Axotal Emagrin Norgesic Verin  Azathioprine Empirin or Empirin with Codeine Normiflo Vitamin E  Azolid Emprazil Nuprin Voltaren  Bayer Aspirin plain, buffered or children's or timed BC Tablets or powders Encaprin Orgaran Warfarin Sodium  Buff-a-Comp Enoxaparin Orudis Zorpin  Buff-a-Comp with Codeine Equegesic Os-Cal-Gesic   Buffaprin Excedrin plain, buffered or Extra Strength Oxalid   Bufferin Arthritis Strength Feldene Oxphenbutazone   Bufferin plain or Extra Strength Feldene Capsules Oxycodone with Aspirin   Bufferin with Codeine Fenoprofen Fenoprofen Pabalate or Pabalate-SF   Buffets II Flogesic Panagesic   Buffinol plain or Extra Strength Florinal or Florinal with Codeine Panwarfarin   Buf-Tabs Flurbiprofen Penicillamine   Butalbital Compound Four-way cold tablets Penicillin   Butazolidin Fragmin Pepto-Bismol   Carbenicillin Geminisyn Percodan   Carna Arthritis Reliever Geopen Persantine   Carprofen Gold's salt Persistin   Chloramphenicol Goody's Phenylbutazone   Chloromycetin Haltrain Piroxlcam   Clmetidine heparin Plaquenil   Cllnoril Hyco-pap Ponstel   Clofibrate Hydroxy chloroquine Propoxyphen          Before stopping any of these medications, be sure to consult the physician who ordered them.  Some, such as Coumadin (Warfarin) are ordered to prevent or treat serious conditions such as "deep thrombosis", "pumonary embolisms", and other heart problems.  The amount of time that you may need off of the medication may also vary with the medication and the reason for which you were taking it.  If you are taking any of these medications, please make sure  you notify your pain physician before you undergo any procedures.         An patient states that the most bothersome pain of the burning stinging sensation of the lower extremity

## 2015-11-14 NOTE — Progress Notes (Signed)
Subjective:    Patient ID: Lorraine Jones, female    DOB: 10/07/1950, 65 y.o.   MRN: [id number]  HPI  The patient is a 65 year old female who returns to pain management for further evaluation and treatment of pain involving the lower back and lower extremity region predominantly the patient states that the lower back pain radiates of the lower extremity region on the right. We discussed patient's condition is concern regarding component of complex regional pain syndrome felt to be contributing to patient's symptomatology in addition to diabetic neuropathy. We will consider patient for lumbar synthetic block to be performed at time return appointment in attempt to decrease the burning stinging throbbing sensations of the lower extremity and avoid the need for more involved treatment as well as minimize progression of patient's symptoms. The patient was with understanding and agreed with suggested treatment plan on today's visit patient also stated that the insurance company will not approve her fentanyl patch 100 g per hour size. We will adjust medications and we'll prescribe fentanyl patch 75 g per hour. Patient will apply 275 g per hour patches and will use oxycodone for breakthrough pain. Precautions patient regarding respiratory depression excessive sedation confusion and other side effects which can occur with these medications. We will proceed with lumbar synthetic block at time return appointment in attempt to decrease severely disabling pain and avoid progression of symptoms. The patient was in agreement with suggested treatment plan.  Review of Systems     Objective:   Physical Exam  There was tends to palpation of the paraspinal muscular region cervical region cervical facet region palpation which reproduces mild discomfort. There was mild tenderness of the splenius capitis and a separate talus muscles regions. Palpation of the acromioclavicular and glenohumeral joint regions reproduces  minimal discomfort. Patient appeared to be with bilaterally equal grip strength without increased pain with Tinel and Phalen's maneuver. Patient appeared to be unremarkable Spurling's maneuver. Palpation over the thoracic facet thoracic paraspinal musculature region was attends to palpation of moderate degree with moderate muscle spasms noted in the thoracic region with no crepitus of the thoracic region noted. There was tenderness over the lumbar paraspinal misreading lumbar facet region of moderately severe degree. Lateral bending rotation extension and palpation of the lumbar facets reproduce moderate severe discomfort. Straight leg raise was tolerates approximately 20 without a definite increase of pain with dorsiflexion noted. There was questionably decreased sensation of the L5 dermatomal distribution. There is of hypersensitivity to touch of the lower extremity and there was negative clonus negative Homans. Palpation over the PSIS and PII S regions reproduce mild to moderate discomfort. There was moderate tenderness to palpation of the greater trochanteric region and iliotibial band region. Abdomen was nontender with no costovertebral angle tenderness noted.      Assessment & Plan:     Degenerative disc disease lumbar spine Status post prior surgical intervention lumbar region with L5-S1 scar formation, facet hypertrophy, and diffuse degenerative changes noted throughout the lumbar spine  Lumbar facet syndrome  Lumbar radiculopathy  Sacroiliac joint disease  Diabetic neuropathy     PLAN   Continue present medication oxycodone and fentanyl patches.  NOTE: Do not apply 100 g per hour fentanyl patch since insurance will not approve the 100 g per hour fentanyl patch for you  Lumbar sympathetic block to be performed at time of return appointment   F/U PCP Dr.Sowles  for evaliation of  BP diabetes mellitus and general medical  condition  F/U surgical evaluation.  May consider  pending follow-up evaluations as discussed  F/U neurological evaluation. May consider [facility] EMG studies and other studies  May consider radiofrequency rhizolysis or intraspinal procedures pending response to present treatment and F/U evaluation   Patient to call Pain Management Center should patient have concerns prior to scheduled return appointment.

## 2015-11-18 ENCOUNTER — Other Ambulatory Visit: Payer: Self-pay | Admitting: Pain Medicine

## 2015-11-20 ENCOUNTER — Telehealth: Payer: Self-pay | Admitting: Pain Medicine

## 2015-11-20 ENCOUNTER — Encounter: Payer: Self-pay | Admitting: Pain Medicine

## 2015-11-20 ENCOUNTER — Ambulatory Visit: Payer: Medicare Other | Attending: Pain Medicine | Admitting: Pain Medicine

## 2015-11-20 VITALS — BP 151/65 | HR 83 | Temp 96.5°F | Resp 15 | Ht 61.0 in | Wt 196.0 lb

## 2015-11-20 DIAGNOSIS — M79605 Pain in left leg: Secondary | ICD-10-CM | POA: Insufficient documentation

## 2015-11-20 DIAGNOSIS — Z9889 Other specified postprocedural states: Secondary | ICD-10-CM | POA: Diagnosis not present

## 2015-11-20 DIAGNOSIS — M545 Low back pain: Secondary | ICD-10-CM | POA: Insufficient documentation

## 2015-11-20 DIAGNOSIS — M5481 Occipital neuralgia: Secondary | ICD-10-CM

## 2015-11-20 DIAGNOSIS — E119 Type 2 diabetes mellitus without complications: Secondary | ICD-10-CM | POA: Diagnosis not present

## 2015-11-20 DIAGNOSIS — M533 Sacrococcygeal disorders, not elsewhere classified: Secondary | ICD-10-CM

## 2015-11-20 DIAGNOSIS — M48062 Spinal stenosis, lumbar region with neurogenic claudication: Secondary | ICD-10-CM

## 2015-11-20 DIAGNOSIS — M5030 Other cervical disc degeneration, unspecified cervical region: Secondary | ICD-10-CM

## 2015-11-20 DIAGNOSIS — M5416 Radiculopathy, lumbar region: Secondary | ICD-10-CM | POA: Diagnosis not present

## 2015-11-20 DIAGNOSIS — M79604 Pain in right leg: Secondary | ICD-10-CM | POA: Insufficient documentation

## 2015-11-20 DIAGNOSIS — M961 Postlaminectomy syndrome, not elsewhere classified: Secondary | ICD-10-CM

## 2015-11-20 DIAGNOSIS — M5136 Other intervertebral disc degeneration, lumbar region: Secondary | ICD-10-CM

## 2015-11-20 DIAGNOSIS — E1340 Other specified diabetes mellitus with diabetic neuropathy, unspecified: Secondary | ICD-10-CM

## 2015-11-20 DIAGNOSIS — M160 Bilateral primary osteoarthritis of hip: Secondary | ICD-10-CM

## 2015-11-20 MED ORDER — MIDAZOLAM HCL 5 MG/5ML IJ SOLN: 5.0000 mg | Freq: Once | INTRAMUSCULAR | Status: DC

## 2015-11-20 MED ORDER — ORPHENADRINE CITRATE 30 MG/ML IJ SOLN: 60.0000 mg | Freq: Once | INTRAMUSCULAR | Status: DC

## 2015-11-20 MED ORDER — FENTANYL CITRATE (PF) 100 MCG/2ML IJ SOLN: 100.0000 ug | Freq: Once | INTRAMUSCULAR | Status: DC

## 2015-11-20 MED ORDER — FENTANYL CITRATE (PF) 100 MCG/2ML IJ SOLN
INTRAMUSCULAR | Status: AC
  Administered 2015-11-20: 100 ug via INTRAVASCULAR
  Filled 2015-11-20: qty 2

## 2015-11-20 MED ORDER — LACTATED RINGERS IV SOLN: 1000.0000 mL | INTRAVENOUS | Status: DC

## 2015-11-20 MED ORDER — TRIAMCINOLONE ACETONIDE 40 MG/ML IJ SUSP: 40.0000 mg | Freq: Once | INTRAMUSCULAR | Status: DC

## 2015-11-20 MED ORDER — BUPIVACAINE HCL (PF) 0.25 % IJ SOLN
INTRAMUSCULAR | Status: AC
  Administered 2015-11-20: 09:00:00
  Filled 2015-11-20: qty 30

## 2015-11-20 MED ORDER — LIDOCAINE HCL (PF) 1 % IJ SOLN: 10.0000 mL | Freq: Once | INTRAMUSCULAR | Status: DC

## 2015-11-20 MED ORDER — CEFAZOLIN SODIUM 1-5 GM-% IV SOLN: 1.0000 g | Freq: Once | INTRAVENOUS | Status: DC

## 2015-11-20 MED ORDER — MIDAZOLAM HCL 5 MG/5ML IJ SOLN
INTRAMUSCULAR | Status: AC
  Administered 2015-11-20: 5 mg via INTRAVENOUS
  Filled 2015-11-20: qty 5

## 2015-11-20 MED ORDER — TRIAMCINOLONE ACETONIDE 40 MG/ML IJ SUSP
INTRAMUSCULAR | Status: AC
  Filled 2015-11-20: qty 1

## 2015-11-20 MED ORDER — CEFUROXIME AXETIL 250 MG PO TABS: 250.0000 mg | ORAL_TABLET | Freq: Two times a day (BID) | ORAL | Status: DC

## 2015-11-20 MED ORDER — CEFAZOLIN SODIUM 1 G IJ SOLR
INTRAMUSCULAR | Status: AC
  Administered 2015-11-20: 09:00:00
  Filled 2015-11-20: qty 10

## 2015-11-20 MED ORDER — BUPIVACAINE HCL (PF) 0.25 % IJ SOLN: 30.0000 mL | Freq: Once | INTRAMUSCULAR | Status: DC

## 2015-11-20 MED ORDER — ORPHENADRINE CITRATE 30 MG/ML IJ SOLN
INTRAMUSCULAR | Status: AC
  Filled 2015-11-20: qty 2

## 2015-11-20 NOTE — Telephone Encounter (Signed)
Fentanyl has been approved by ins per Lattie Haw at [facility], Nelma Rothman was left by Lattie Haw on 11-17-15 at [facility]

## 2015-11-20 NOTE — Progress Notes (Signed)
Subjective:    Patient ID: Lorraine Jones, female    DOB: [birth year], 65 y.o.   MRN: [id number]  HPI  PROCEDURE PERFORMED: Lumbar sympathetic block.  HISTORY OF PRESENT ILLNESS: The patient is 65 y.o. female who returns to [place] for further evaluation and treatment of pain involving the lower extremities. The patient is with history of diabetes mellitus and surgery of the lumbar region with pain described as burning stinging shooting pains of the lower extremity . There is concern regarding the patient's pain being due to diabetic neuropathy in addition to intraspinal abnormalities of the lumbar region. Decision made to proceed with lumbar sympathetic block to treat pain felt to be due to diabetic neuropathy which is cause significant discomfort throughout the day as well as inability for patient to obtain restful sleep. . The risks, benefits, and expectations of the procedure were discussed and explained to the patient who was understanding and wished to proceed with interventional treatment as planned.   DESCRIPTION OF PROCEDURE: Lumbar sympathetic block with IV Versed, IV fentanyl conscious sedation, EKG, blood pressure, pulse, pulse oximetry monitoring. The procedure was performed with the patient in prone position under fluoroscopic guidance.   NEEDLE PLACEMENT AT L2, right side lumbar sympathetic block: With the patient in prone position and oblique orientation of 20 degrees, Betadine prep and local anesthetic skin wheal of 1.5% lidocaine plain was prepared at the proposed needle entry site. Under fluoroscopic guidance with 20 degrees oblique orientation, the 22 -gauge needle was inserted at the lateral border of the L2 vertebral body on the right side.   NEEDLE PLACEMENT AT L3, right side lumbar sympathetic block: With the patient in the prone position and oblique orientation of 20 degrees, Betadine prep and local anesthetic skin wheal of 1.5% lidocaine plain was prepared at  the proposed needle entry site. Under fluoroscopic guidance with 20 degrees  oblique orientation, the 22 -gauge needle was inserted at the lateral border of the L3 vertebral body on the right side.   NEEDLE PLACEMENT AT L4, right side lumbar sympathetic block: With the patient in prone position and oblique orientation of 20 degrees, Betadine prep and local anesthetic skin wheal of 1.5% lidocaine plain was prepared at the proposed needle entry site. Under fluoroscopic guidance with 20 degrees  oblique orientation, the 22 -gauge needle was inserted at the lateral border of the L4 vertebral body on the right side.    Following needle placement at the L2, L3 and L4 vertebral body levels on the right side, needle placement was then verified on lateral view with tip of the needle documented to be in the anterior third of the vertebral body of L2, L3 and L4 respectively. Following negative aspiration of each needle for heme and CSF, L2 vertebral body level needle was injected with 10 mL of 0.25% bupivacaine. L3 vertebral body level needle was injected with 10 mL of 0.25% bupivacaine. L4 vertebral body level was injected with 10 mL of 0.25% bupivacaine. Needles were removed. Please note temperature readings prior to lumbar sympathetic block were noted to be 90 degrees Fahrenheit and following completion of the lumbar sympathetic block temperature readings of the lower extremity were noted to be 92.8 degrees Fahrenheit.   The patient tolerated the procedure well.  PLAN:   1. Medications: We will continue presently prescribed medications oxycodone and fentanyl patch at this time. 2. The patient is to follow-up with primary care physician Dr. Ancil Boozer for further evaluation of blood pressure and general  medical condition as discussed. 3. Surgical evaluation as discussed. 4. Neurological evaluation as discussed. 5. The patient may be candidate for radiofrequency procedures, implantation devices, and other  treatment pending response to treatment and follow-up evaluation. 6. The patient has been advised to adhere to proper body mechanics. 7. The patient has been advised to call the Pain Management Center prior to scheduled return appointment should there be significant change in condition or have other concerns regarding condition prior to scheduled return appointment.   The patient was understanding and in agreement with suggested treatment plan.   Review of Systems     Objective:   Physical Exam        Assessment & Plan:

## 2015-11-20 NOTE — Patient Instructions (Addendum)
PLAN  Continue present medication oxycodone and fentanyl patch and begin taking antibiotic Ceftin as prescribed. Please obtain your antibiotic today and begin taking antibiotic today  F/U PCP Dr.  Ancil Boozer for evaliation of  BPdiabetes mellitusd general medical  condition.  F/U surgical evaluation. May consider pending follow-up evaluations  F/U neurological evaluation. May consider pending follow-up evaluations  May consider radiofrequency rhizolysis or intraspinal procedures pending response to present treatment and F/U evaluation.  Patient to call [facility] Management Center should patient have concerns prior to scheduled return appointment.  Pain Management Discharge Instructions  General Discharge Instructions :  If you need to reach your doctor call: Monday-Friday 8:00 am - 4:00 pm at [phone] or toll free [phone].  After clinic hours [phone] to have operator reach doctor.  Bring all of your medication bottles to all your appointments in the pain clinic.  To cancel or reschedule your appointment with Pain Management please remember to call 24 hours in advance to avoid a fee.  Refer to the educational materials which you have been given on: General Risks, I had my Procedure. Discharge Instructions, Post Sedation.  Post Procedure Instructions:  The drugs you were given will stay in your system until tomorrow, so for the next 24 hours you should not drive, make any legal decisions or drink any alcoholic beverages.  You may eat anything you prefer, but it is better to start with liquids then soups and crackers, and gradually work up to solid foods.  Please notify your doctor immediately if you have any unusual bleeding, trouble breathing or pain that is not related to your normal pain.  Depending on the type of procedure that was done, some parts of your body may feel week and/or numb.  This usually clears up by tonight or the next day.  Walk with the use of an  assistive device or accompanied by an adult for the 24 hours.  You may use ice on the affected area for the first 24 hours.  Put ice in a Ziploc bag and cover with a towel and place against area 15 minutes on 15 minutes off.  You may switch to heat after 24 hours.  A prescription for Ceftin was sent to your pharmacy and should be available for pickup today.

## 2015-11-21 NOTE — Telephone Encounter (Signed)
No problems post procedure. 

## 2015-11-29 ENCOUNTER — Other Ambulatory Visit: Payer: Self-pay | Admitting: Pain Medicine

## 2015-11-30 ENCOUNTER — Other Ambulatory Visit: Payer: Self-pay | Admitting: Family Medicine

## 2015-11-30 DIAGNOSIS — Z794 Long term (current) use of insulin: Secondary | ICD-10-CM | POA: Diagnosis not present

## 2015-11-30 DIAGNOSIS — E1165 Type 2 diabetes mellitus with hyperglycemia: Secondary | ICD-10-CM | POA: Diagnosis not present

## 2015-11-30 MED ORDER — DULAGLUTIDE 0.75 MG/0.5ML ~~LOC~~ SOAJ: 0.7500 mg | SUBCUTANEOUS | Status: DC

## 2015-12-06 ENCOUNTER — Ambulatory Visit (INDEPENDENT_AMBULATORY_CARE_PROVIDER_SITE_OTHER): Payer: Medicare Other | Admitting: Family Medicine

## 2015-12-06 ENCOUNTER — Encounter: Payer: Self-pay | Admitting: Family Medicine

## 2015-12-06 VITALS — BP 128/62 | HR 107 | Temp 98.6°F | Resp 18 | Wt 198.8 lb

## 2015-12-06 DIAGNOSIS — Z1239 Encounter for other screening for malignant neoplasm of breast: Secondary | ICD-10-CM | POA: Diagnosis not present

## 2015-12-06 DIAGNOSIS — E1143 Type 2 diabetes mellitus with diabetic autonomic (poly)neuropathy: Secondary | ICD-10-CM

## 2015-12-06 DIAGNOSIS — IMO0002 Reserved for concepts with insufficient information to code with codable children: Secondary | ICD-10-CM

## 2015-12-06 DIAGNOSIS — I10 Essential (primary) hypertension: Secondary | ICD-10-CM

## 2015-12-06 DIAGNOSIS — Z0000 Encounter for general adult medical examination without abnormal findings: Secondary | ICD-10-CM

## 2015-12-06 DIAGNOSIS — Z124 Encounter for screening for malignant neoplasm of cervix: Secondary | ICD-10-CM

## 2015-12-06 DIAGNOSIS — E1165 Type 2 diabetes mellitus with hyperglycemia: Secondary | ICD-10-CM

## 2015-12-06 DIAGNOSIS — Z1151 Encounter for screening for human papillomavirus (HPV): Secondary | ICD-10-CM | POA: Diagnosis not present

## 2015-12-06 NOTE — Patient Instructions (Signed)
  Lorraine Jones , Thank you for taking time to come for your Medicare Wellness Visit. I appreciate your ongoing commitment to your health goals. Please review the following plan we discussed and let me know if I can assist you in the future.   These are the goals we discussed:  Exercise at least arms daily   This is a list of the screening recommended for you and due dates:  Health Maintenance  Topic Date Due  . Shingles Vaccine  05/16/2016*  . HIV Screening  03/10/2024*  . Complete foot exam   03/20/2016  . Eye exam for diabetics  03/20/2016  . Flu Shot  04/30/2016  . Hemoglobin A1C  04/30/2016  . Mammogram  02/23/2017  . Tetanus Vaccine  06/28/2018  . Pap Smear  12/06/2018  . Colon Cancer Screening  02/13/2021  .  Hepatitis C: One time screening is recommended by Center for Disease Control  (CDC) for  adults born from [birth year] through 1965.   Completed  *Topic was postponed. The date shown is not the original due date.

## 2015-12-06 NOTE — Progress Notes (Signed)
Name: Lorraine Jones   MRN: [id number]    DOB: May 24, 1951   Date:12/06/2015       Progress Note  Subjective  Chief Complaint  Chief Complaint  Patient presents with  . Annual Exam    patient was seen by the endocrinologist and was taken off of Actos and put on Trulicity  . Orders    mammogram patient normally has it in April.    HPI  Functional ability/safety issues: Needs a cane for ambulation, help cleaning and sometimes cooking at home. No safety issues Hearing issues: Addressed  Activities of daily living: Discussed Home safety issues: No Issues  End Of Life Planning: Offered verbal information regarding advanced directives, healthcare power of attorney.  Preventative care, Health maintenance, Preventative health measures discussed.  Preventative screenings discussed today: lab work, colonoscopy, PAP, mammogram, DEXA.  Low Dose CT Chest recommended if Age 75-80 years, 30 pack-year currently smoking OR have quit w/in 15years.   Lifestyle risk factor issued reviewed: Diet, exercise, weight management, advised patient smoking is not healthy, nutrition/diet.  Preventative health measures discussed (5-10 year plan).  Reviewed and recommended vaccinations: - Pneumovax  - Prevnar  - Annual Influenza - Zostavax - Tdap   Depression screening: Done Fall risk screening: Done Discuss ADLs/IADLs: Done  Current medical providers: See HPI  Other health risk factors identified this visit: No other issues Cognitive impairment issues: None identified  All above discussed with patient. Appropriate education, counseling and referral will be made based upon the above.   DMII: seen by Endo and Actos was stopped and is now on Trulicity , tolerating it well, glucose has not been checked since started on new regiment  HTN: bp was very high at pain clinic, but back to normal today, no orthostatic changes, no chest pain or palpitation.   Patient Active Problem List   Diagnosis Date  Noted  . Gastroesophageal reflux disease without esophagitis 05/12/2015  . Chronic pain 05/12/2015  . Hyperlipidemia 05/12/2015  . DDD (degenerative disc disease), lumbar 02/27/2015  . Lumbar post-laminectomy syndrome 02/27/2015  . Neuropathy due to secondary diabetes ([place]) 02/27/2015  . Spinal stenosis, lumbar region, with neurogenic claudication 02/27/2015  . Sacroiliac joint dysfunction of both sides 02/27/2015  . Degenerative joint disease (DJD) of hip 02/27/2015  . DDD (degenerative disc disease), cervical 02/27/2015  . Bilateral occipital neuralgia 02/27/2015  . Chronic constipation 06/02/2014  . Hyponatremia 06/01/2014  . Hypertension, benign 06/01/2014    Past Surgical History  Procedure Laterality Date  . Cholecystectomy  1996  . Lumbar disc surgery  1993    L4  --  L5  . Cystoscopy with ureteroscopy Right 09/01/2013    Procedure: CYSTOSCOPY WITH UNROOFING  RIGHT URETEROCELE AND URETERAL STONE REMOVED  WITH GYRUS COLLINS KNIFE. ;  Surgeon: Irine Seal, MD;  Location: [place];  Service: Urology;  Laterality: Right;  cysto, right uretersoscopy and stone extraction   collins knife    Family History  Problem Relation Age of Onset  . Diabetes Mother   . Heart disease Mother   . Hypertension Mother     Social History   Social History  . Marital Status: Married    Spouse Name: N/A  . Number of Children: N/A  . Years of Education: N/A   Occupational History  . Not on file.   Social History Main Topics  . Smoking status: Current Every Day Smoker -- 0.50 packs/day for 12 years    Types: Cigarettes  . Smokeless tobacco:  Never Used  . Alcohol Use: No  . Drug Use: No  . Sexual Activity: Yes   Other Topics Concern  . Not on file   Social History Narrative     Current outpatient prescriptions:  .  aspirin EC 81 MG tablet, Take 81 mg by mouth daily., Disp: , Rfl:  .  carvedilol (COREG) 3.125 MG tablet, Take 1 tablet (3.125 mg total) by mouth 2  (two) times daily with a meal., Disp: 180 tablet, Rfl: 1 .  dorzolamide-timolol (COSOPT) 22.3-6.8 MG/ML ophthalmic solution, Place 1 drop into both eyes 2 (two) times daily., Disp: , Rfl:  .  Dulaglutide (TRULICITY) [id number] [id number] SOPN, Inject 0.75 mg into the skin once a week., Disp: 4 pen, Rfl: 0 .  fentaNYL (DURAGESIC - DOSED MCG/HR) 75 MCG/HR, Apply 2 patches to skin every 2 days if tolerated.   NOTE: Do not apply 100 g per hour fentanyl patch since insurance will not approve 100 g patch for you, Disp: 30 patch, Rfl: 0 .  Insulin Lispro (HUMALOG KWIKPEN) 200 UNIT/ML SOPN, Inject 24 Units into the skin 3 (three) times daily before meals., Disp: 45 mL, Rfl: 1 .  LANTUS SOLOSTAR 100 UNIT/ML Solostar Pen, INJECT 50 UNITS INTO THE SKIN DAILY AT 10 PM., Disp: 15 pen, Rfl: 5 .  metFORMIN (GLUCOPHAGE) 1000 MG tablet, Take 1 tablet (1,000 mg total) by mouth 2 (two) times daily with a meal., Disp: 180 tablet, Rfl: 1 .  metoCLOPramide (REGLAN) 5 MG tablet, Take 1 tablet (5 mg total) by mouth 3 (three) times daily., Disp: 270 tablet, Rfl: 1 .  Multiple Vitamins-Minerals (MULTIVITAMIN WITH MINERALS) tablet, Take 1 tablet by mouth daily., Disp: , Rfl:  .  Olmesartan-Amlodipine-HCTZ (TRIBENZOR) 40-10-25 MG TABS, Take 1 tablet by mouth daily., Disp: 90 tablet, Rfl: 1 .  Oxycodone HCl 10 MG TABS, Limit 1 tab by mouth 4-8 times per for breakthrough pain while wearing fentanyl patches day if tolerated, Disp: 200 tablet, Rfl: 0 .  polyethylene glycol (MIRALAX / GLYCOLAX) packet, Take 17 g by mouth daily., Disp: 14 each, Rfl: 0 .  pravastatin (PRAVACHOL) 40 MG tablet, Take 1 tablet (40 mg total) by mouth daily., Disp: 90 tablet, Rfl: 1  Current facility-administered medications:  .  bupivacaine (PF) (MARCAINE) 0.25 % injection 30 mL, 30 mL, Other, Once, Mohammed Kindle, MD .  bupivacaine (PF) (MARCAINE) 0.25 % injection 30 mL, 30 mL, Other, Once, Mohammed Kindle, MD .  ceFAZolin (ANCEF) IVPB 1 g/50 mL premix, 1 g,  Intravenous, Once, Mohammed Kindle, MD .  ceFAZolin (ANCEF) IVPB 1 g/50 mL premix, 1 g, Intravenous, Once, Mohammed Kindle, MD .  fentaNYL (SUBLIMAZE) injection 100 mcg, 100 mcg, Intravenous, Once, Mohammed Kindle, MD .  fentaNYL (SUBLIMAZE) injection 100 mcg, 100 mcg, Intravenous, Once, Mohammed Kindle, MD .  lactated ringers infusion 1,000 mL, 1,000 mL, Intravenous, Continuous, Mohammed Kindle, MD .  lactated ringers infusion 1,000 mL, 1,000 mL, Intravenous, Continuous, Mohammed Kindle, MD .  lidocaine (PF) (XYLOCAINE) 1 % injection 10 mL, 10 mL, Subcutaneous, Once, Mohammed Kindle, MD .  lidocaine (PF) (XYLOCAINE) 1 % injection 10 mL, 10 mL, Subcutaneous, Once, Mohammed Kindle, MD .  midazolam (VERSED) 5 MG/5ML injection 5 mg, 5 mg, Intravenous, Once, Mohammed Kindle, MD .  midazolam (VERSED) 5 MG/5ML injection 5 mg, 5 mg, Intravenous, Once, Mohammed Kindle, MD .  orphenadrine (NORFLEX) injection 60 mg, 60 mg, Intramuscular, Once, Mohammed Kindle, MD .  orphenadrine (NORFLEX) injection 60 mg, 60 mg, Intramuscular, Once, Belenda Cruise  Primus Bravo, MD .  triamcinolone acetonide (KENALOG-40) injection 40 mg, 40 mg, Other, Once, Mohammed Kindle, MD .  triamcinolone acetonide [facility]) injection 40 mg, 40 mg, Other, Once, Mohammed Kindle, MD  Allergies  Allergen Reactions  . Diclofenac Sodium Swelling  . Lodine [Etodolac] Swelling  . Naproxen Sodium Swelling  . Neurontin [Gabapentin] Swelling     ROS  Constitutional: Negative for fever or weight change.  Respiratory: Negative for cough and shortness of breath.   Cardiovascular: Negative for chest pain or palpitations.  Gastrointestinal: Negative for abdominal pain, no bowel changes.  Musculoskeletal: Positive  for gait problem, and  joint swelling.  Skin: Negative for rash.  Neurological: Negative for dizziness or headache.  No other specific complaints in a complete review of systems (except as listed in HPI above).  Objective  Filed Vitals:   12/06/15 0822  BP:  128/62  Pulse: 107  Temp: 98.6 F (37 C)  TempSrc: Oral  Resp: 18  Weight: 198 lb 12.8 oz (90.175 kg)  SpO2: 96%    Body mass index is 37.58 kg/(m^2).  Physical Exam  Constitutional: Patient appears well-developed and obese No distress.  HENT: Head: Normocephalic and atraumatic. Ears: B TMs ok, no erythema or effusion; Nose: Nose normal. Mouth/Throat: Oropharynx is clear and moist. No oropharyngeal exudate.  Eyes: Conjunctivae and EOM are normal. Pupils are equal, round, and reactive to light. No scleral icterus.  Neck: Normal range of motion. Neck supple. No JVD present. No thyromegaly present.  Cardiovascular: Normal rate, regular rhythm and normal heart sounds.  No murmur heard. No BLE edema. Pulmonary/Chest: Effort normal and breath sounds normal. No respiratory distress. Abdominal: Soft. Bowel sounds are normal, no distension. There is no tenderness. no masses Breast: no lumps or masses, no nipple discharge or rashes FEMALE GENITALIA:  External genitalia normal External urethra normal Vaginal vault normal without discharge or lesions Cervix normal without discharge or lesions Bimanual exam normal without masses RECTAL: not done Musculoskeletal: Normal range of motion, no joint effusions. Tender during palpation of lumbar spine, decrease rom of lumbar spine Neurological: he is alert and oriented to person, place, and time. No cranial nerve deficit. Coordination, balance, strength, speech and gait are normal.  Skin: Skin is warm and dry. No rash noted. No erythema.  Psychiatric: Patient has a normal mood and affect. behavior is normal. Judgment and thought content normal.  Recent Results (from the past 2160 hour(s))  POCT UA - Microalbumin     Status: None   Collection Time: 09/07/15 11:53 AM  Result Value Ref Range   Microalbumin Ur, POC 20 mg/L   Creatinine, POC  mg/dL   Albumin/Creatinine Ratio, Urine, POC    POCT glycosylated hemoglobin (Hb A1C)     Status: Abnormal    Collection Time: 11/01/15  9:26 AM  Result Value Ref Range   Hemoglobin A1C 10.5     PHQ2/9: Depression screen [facility] 2/9 12/06/2015 11/01/2015 09/20/2015 08/22/2015 07/26/2015  Decreased Interest 0 0 0 0 0  Down, Depressed, Hopeless 0 1 0 0 0  PHQ - 2 Score 0 1 0 0 0  Altered sleeping - - - - -  Tired, decreased energy - - - - -  Change in appetite - - - - -  Feeling bad or failure about yourself  - - - - -  Trouble concentrating - - - - -  Moving slowly or fidgety/restless - - - - -  Suicidal thoughts - - - - -  PHQ-9 Score - - - - -  Difficult doing work/chores - - - - -     Fall Risk: Fall Risk  12/06/2015 11/20/2015 11/14/2015 11/01/2015 10/16/2015  Falls in the past year? No No No No No  Risk for fall due to : - - - Impaired mobility -      Functional Status Survey: Is the patient deaf or have difficulty hearing?: No Does the patient have difficulty seeing, even when wearing glasses/contacts?: No (patient wears glasses) Does the patient have difficulty concentrating, remembering, or making decisions?: No Does the patient have difficulty walking or climbing stairs?: Yes Does the patient have difficulty dressing or bathing?: No Does the patient have difficulty doing errands alone such as visiting a doctor's office or shopping?: No    Assessment & Plan  1. Medicare annual wellness visit, subsequent  Discussed importance of 150 minutes of physical activity weekly, eat two servings of fish weekly, eat one serving of tree nuts ( cashews, pistachios, pecans, almonds.Marland Kitchen) every other day, eat 6 servings of fruit/vegetables daily and drink plenty of water and avoid sweet beverages.   2. Cervical cancer screening  - PapLb, HPV, rfx16/18  3. Breast cancer screening  - MM Digital Screening; Future  4. Hypertension, benign  Well controlled  5. 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

## 2015-12-07 ENCOUNTER — Telehealth: Payer: Self-pay

## 2015-12-07 NOTE — Telephone Encounter (Signed)
Patient called stating [facility] ([place]) gave her the wrong medication (Amlodipine 5mg ) instead of Tribenzor.   I called [facility] and they informed me that they have not filled any meds for her since 08/2015. I then called this patient back and she asked if we could call in the correct one. I told her that it was submitted to [facility] ([place]) but she wanted it transferred to [place].   It was transferred to [place] via prescriber's voicemail.

## 2015-12-08 LAB — PAPLB, HPV, RFX16/18
HPV, high-risk: NEGATIVE
PAP Smear Comment: 0

## 2015-12-12 ENCOUNTER — Encounter: Payer: Self-pay | Admitting: Pain Medicine

## 2015-12-12 ENCOUNTER — Ambulatory Visit: Payer: Medicare Other | Attending: Pain Medicine | Admitting: Pain Medicine

## 2015-12-12 VITALS — BP 149/93 | HR 98 | Temp 98.2°F | Resp 15 | Ht 61.0 in | Wt 198.0 lb

## 2015-12-12 DIAGNOSIS — Z9889 Other specified postprocedural states: Secondary | ICD-10-CM | POA: Insufficient documentation

## 2015-12-12 DIAGNOSIS — M5136 Other intervertebral disc degeneration, lumbar region: Secondary | ICD-10-CM

## 2015-12-12 DIAGNOSIS — M160 Bilateral primary osteoarthritis of hip: Secondary | ICD-10-CM

## 2015-12-12 DIAGNOSIS — M5481 Occipital neuralgia: Secondary | ICD-10-CM

## 2015-12-12 DIAGNOSIS — M961 Postlaminectomy syndrome, not elsewhere classified: Secondary | ICD-10-CM

## 2015-12-12 DIAGNOSIS — M47817 Spondylosis without myelopathy or radiculopathy, lumbosacral region: Secondary | ICD-10-CM | POA: Diagnosis not present

## 2015-12-12 DIAGNOSIS — M533 Sacrococcygeal disorders, not elsewhere classified: Secondary | ICD-10-CM | POA: Diagnosis not present

## 2015-12-12 DIAGNOSIS — M5116 Intervertebral disc disorders with radiculopathy, lumbar region: Secondary | ICD-10-CM | POA: Diagnosis not present

## 2015-12-12 DIAGNOSIS — M1610 Unilateral primary osteoarthritis, unspecified hip: Secondary | ICD-10-CM | POA: Diagnosis not present

## 2015-12-12 DIAGNOSIS — M47896 Other spondylosis, lumbar region: Secondary | ICD-10-CM | POA: Insufficient documentation

## 2015-12-12 DIAGNOSIS — E1340 Other specified diabetes mellitus with diabetic neuropathy, unspecified: Secondary | ICD-10-CM

## 2015-12-12 DIAGNOSIS — M48062 Spinal stenosis, lumbar region with neurogenic claudication: Secondary | ICD-10-CM

## 2015-12-12 DIAGNOSIS — M545 Low back pain: Secondary | ICD-10-CM | POA: Diagnosis present

## 2015-12-12 DIAGNOSIS — M5416 Radiculopathy, lumbar region: Secondary | ICD-10-CM | POA: Diagnosis not present

## 2015-12-12 DIAGNOSIS — M79606 Pain in leg, unspecified: Secondary | ICD-10-CM | POA: Diagnosis present

## 2015-12-12 DIAGNOSIS — M791 Myalgia: Secondary | ICD-10-CM | POA: Diagnosis not present

## 2015-12-12 DIAGNOSIS — M5030 Other cervical disc degeneration, unspecified cervical region: Secondary | ICD-10-CM

## 2015-12-12 MED ORDER — OXYCODONE HCL 10 MG PO TABS: ORAL_TABLET | ORAL | Status: DC

## 2015-12-12 MED ORDER — FENTANYL 75 MCG/HR TD PT72: MEDICATED_PATCH | TRANSDERMAL | Status: DC

## 2015-12-12 NOTE — Progress Notes (Signed)
Safety precautions to be maintained throughout the outpatient stay will include: orient to surroundings, keep bed in low position, maintain call bell within reach at all times, provide assistance with transfer out of bed and ambulation.  

## 2015-12-12 NOTE — Patient Instructions (Addendum)
PLAN   Continue present medication oxycodone and fentanyl patches.  NOTE: Do not apply 100 g per hour fentanyl patch since insurance will not approve the 100 g per hour fentanyl patch for you  Block of nerves to the sacroiliac joint to be performed at time of return appointment  F/U PCP Dr.Sowles  for evaliation of  BP diabetes mellitus and general medical  condition  F/U surgical evaluation. Surgical evaluation discussed and we will proceed with surgical evaluation once patient wishes to do such  F/U neurological evaluation. May consider PNCV EMG studies and other studies  May consider radiofrequency rhizolysis or intraspinal procedures pending response to present treatment and F/U evaluation   Patient to call Pain Management Center should patient have concerns prior to scheduled return appointment.Selective Nerve Root Block Patient Information  Description: Specific nerve roots exit the spinal canal and these nerves can be compressed and inflamed by a bulging disc and bone spurs.  By injecting steroids on the nerve root, we can potentially decrease the inflammation surrounding these nerves, which often leads to decreased pain.  Also, by injecting local anesthesia on the nerve root, this can provide [place] helpful information to give to your referring doctor if it decreases your pain.  Selective nerve root blocks can be done along the spine from the neck to the low back depending on the location of your pain.   After numbing the skin with local anesthesia, a small needle is passed to the nerve root and the position of the needle is verified using x-ray pictures.  After the needle is in correct position, we then deposit the medication.  You may experience a pressure sensation while this is being done.  The entire block usually lasts less than 15 minutes.  Conditions that may be treated with selective nerve root blocks:  Low back and leg pain  Spinal stenosis  Diagnostic block prior to  potential surgery  Neck and arm pain  Post laminectomy syndrome  Preparation for the injection:  1. Do not eat any solid food or dairy products within 8 hours of your appointment. 2. You may drink clear liquids up to 3 hours before an appointment.  Clear liquids include water, black coffee, juice or soda.  No milk or cream please. 3. You may take your regular medications, including pain medications, with a sip of water before your appointment.  Diabetics should hold regular insulin (if taken separately) and take 1/2 normal NPH dose the morning of the procedure.  Carry some sugar containing items with you to your appointment. 4. A driver must accompany you and be prepared to drive you home after your procedure. 5. Bring all your current medications with you. 6. An IV may be inserted and sedation may be given at the discretion of the physician. 7. A blood pressure cuff, EKG, and other monitors will often be applied during the procedure.  Some patients may need to have extra oxygen administered for a short period. 8. You will be asked to provide medical information, including allergies, prior to the procedure.  We must know immediately if you are taking blood  Thinners (like Coumadin) or if you are allergic to IV iodine contrast (dye).  Possible side-effects: All are usually temporary  Bleeding from needle site  Light headedness  Numbness and tingling  Decreased blood pressure  Weakness in arms/legs  Pressure sensation in back/neck  Pain at injection site (several days)  Possible complications: All are extremely rare  Infection  Nerve injury  Spinal headache (a headache wore with upright position)  Call if you experience:  Fever/chills associated with headache or increased back/neck pain  Headache worsened by an upright position  New onset weakness or numbness of an extremity below the injection site  Hives or difficulty breathing (go to the emergency  room)  Inflammation or drainage at the injection site(s)  Severe back/neck pain greater than usual  New symptoms which are concerning to you  Please note:  Although the local anesthetic injected can often make your back or neck feel good for several hours after the injection the pain will likely return.  It takes 3-5 days for steroids to work on the nerve root. You may not notice any pain relief for at least one week.  If effective, we will often do a series of 3 injections spaced 3-6 weeks apart to maximally decrease your pain.    If you have any questions, please call [phone] [facility] R[facility] AND COMPLICATIONS  What are the risk, side effects and possible complications? Generally speaking, most procedures are safe.  However, with any procedure there are risks, side effects, and the possibility of complications.  The risks and complications are dependent upon the sites that are lesioned, or the type of nerve block to be performed.  The closer the procedure is to the spine, the more serious the risks are.  Great care is taken when placing the radio frequency needles, block needles or lesioning probes, but sometimes complications can occur. 1. Infection: Any time there is an injection through the skin, there is a risk of infection.  This is why sterile conditions are used for these blocks.  There are four possible types of infection. 1. Localized skin infection. 2. Central Nervous System Infection-This can be in the form of Meningitis, which can be deadly. 3. Epidural Infections-This can be in the form of an epidural abscess, which can cause pressure inside of the spine, causing compression of the spinal cord with subsequent paralysis. This would require an emergency surgery to decompress, and there are no guarantees that the patient would recover from the paralysis. 4. Discitis-This is an infection of the intervertebral discs.  It occurs in about  1% of discography procedures.  It is difficult to treat and it may lead to surgery.        2. Pain: the needles have to go through skin and soft tissues, will cause soreness.       3. Damage to internal structures:  The nerves to be lesioned may be near blood vessels or    other nerves which can be potentially damaged.       4. Bleeding: Bleeding is more common if the patient is taking blood thinners such as  aspirin, Coumadin, Ticiid, Plavix, etc., or if he/she have some genetic predisposition  such as hemophilia. Bleeding into the spinal canal can cause compression of the spinal  cord with subsequent paralysis.  This would require an emergency surgery to  decompress and there are no guarantees that the patient would recover from the  paralysis.       5. Pneumothorax:  Puncturing of a lung is a possibility, every time a needle is introduced in  the area of the chest or upper back.  Pneumothorax refers to free air around the  collapsed lung(s), inside of the thoracic cavity (chest cavity).  Another two possible  complications related to a similar event would include: Hemothorax and Chylothorax.   These are  variations of the Pneumothorax, where instead of air around the collapsed  lung(s), you may have blood or chyle, respectively.       6. Spinal headaches: They may occur with any procedures in the area of the spine.       7. Persistent CSF (Cerebro-Spinal Fluid) leakage: This is a rare problem, but may occur  with prolonged intrathecal or epidural catheters either due to the formation of a fistulous  track or a dural tear.       8. Nerve damage: By working so close to the spinal cord, there is always a possibility of  nerve damage, which could be as serious as a permanent spinal cord injury with  paralysis.       9. Death:  Although rare, severe deadly allergic reactions known as "Anaphylactic  reaction" can occur to any of the medications used.      10. Worsening of the symptoms:  We can always make  thing worse.  What are the chances of something like this happening? Chances of any of this occuring are extremely low.  By statistics, you have more of a chance of getting killed in a motor vehicle accident: while driving to the hospital than any of the above occurring .  Nevertheless, you should be aware that they are possibilities.  In general, it is similar to taking a shower.  Everybody knows that you can slip, hit your head and get killed.  Does that mean that you should not shower again?  Nevertheless always keep in mind that statistics do not mean anything if you happen to be on the wrong side of them.  Even if a procedure has a 1 (one) in a 1,000,000 (million) chance of going wrong, it you happen to be that one..Also, keep in mind that by statistics, you have more of a chance of having something go wrong when taking medications.  Who should not have this procedure? If you are on a blood thinning medication (e.g. Coumadin, Plavix, see list of "Blood Thinners"), or if you have an active infection going on, you should not have the procedure.  If you are taking any blood thinners, please inform your physician.  How should I prepare for this procedure?  Do not eat or drink anything at least six hours prior to the procedure.  Bring a driver with you .  It cannot be a taxi.  Come accompanied by an adult that can drive you back, and that is strong enough to help you if your legs get weak or numb from the local anesthetic.  Take all of your medicines the morning of the procedure with just enough water to swallow them.  If you have diabetes, make sure that you are scheduled to have your procedure done first thing in the morning, whenever possible.  If you have diabetes, take only half of your insulin dose and notify our nurse that you have done so as soon as you arrive at the clinic.  If you are diabetic, but only take blood sugar pills (oral hypoglycemic), then do not take them on the morning  of your procedure.  You may take them after you have had the procedure.  Do not take aspirin or any aspirin-containing medications, at least eleven (11) days prior to the procedure.  They may prolong bleeding.  Wear loose fitting clothing that may be easy to take off and that you would not mind if it got stained with Betadine or blood.  Do not  wear any jewelry or perfume  Remove any nail coloring.  It will interfere with some of our monitoring equipment.  NOTE: Remember that this is not meant to be interpreted as a complete list of all possible complications.  Unforeseen problems may occur.  BLOOD THINNERS The following drugs contain aspirin or other products, which can cause increased bleeding during surgery and should not be taken for 2 weeks prior to and 1 week after surgery.  If you should need take something for relief of minor pain, you may take acetaminophen which is found in Tylenol,m Datril, Anacin-3 and Panadol. It is not blood thinner. The products listed below are.  Do not take any of the products listed below in addition to any listed on your instruction sheet.  A.P.C or A.P.C with Codeine Codeine Phosphate Capsules #3 Ibuprofen Ridaura  ABC compound Congesprin Imuran rimadil  Advil Cope Indocin Robaxisal  Alka-Seltzer Effervescent Pain Reliever and Antacid Coricidin or Coricidin-D  Indomethacin Rufen  Alka-Seltzer plus Cold Medicine Cosprin Ketoprofen S-A-C Tablets  Anacin Analgesic Tablets or Capsules Coumadin Korlgesic Salflex  Anacin Extra Strength Analgesic tablets or capsules CP-2 Tablets Lanoril Salicylate  Anaprox Cuprimine Capsules Levenox Salocol  Anexsia-D Dalteparin Magan Salsalate  Anodynos Darvon compound Magnesium Salicylate Sine-off  Ansaid Dasin Capsules Magsal Sodium Salicylate  Anturane Depen Capsules Marnal Soma  APF Arthritis pain formula Dewitt's Pills Measurin Stanback  Argesic Dia-Gesic Meclofenamic Sulfinpyrazone  Arthritis Bayer Timed Release  Aspirin Diclofenac Meclomen Sulindac  Arthritis pain formula Anacin Dicumarol Medipren Supac  Analgesic (Safety coated) Arthralgen Diffunasal Mefanamic Suprofen  Arthritis Strength Bufferin Dihydrocodeine Mepro Compound Suprol  Arthropan liquid Dopirydamole Methcarbomol with Aspirin Synalgos  ASA tablets/Enseals Disalcid Micrainin Tagament  Ascriptin Doan's Midol Talwin  Ascriptin A/D Dolene Mobidin Tanderil  Ascriptin Extra Strength Dolobid Moblgesic Ticlid  Ascriptin with Codeine Doloprin or Doloprin with Codeine Momentum Tolectin  Asperbuf Duoprin Mono-gesic Trendar  Aspergum Duradyne Motrin or Motrin IB Triminicin  Aspirin plain, buffered or enteric coated Durasal Myochrisine Trigesic  Aspirin Suppositories Easprin Nalfon Trillsate  Aspirin with Codeine Ecotrin Regular or Extra Strength Naprosyn Uracel  Atromid-S Efficin Naproxen Ursinus  Auranofin Capsules Elmiron Neocylate Vanquish  Axotal Emagrin Norgesic Verin  Azathioprine Empirin or Empirin with Codeine Normiflo Vitamin E  Azolid Emprazil Nuprin Voltaren  Bayer Aspirin plain, buffered or children's or timed BC Tablets or powders Encaprin Orgaran Warfarin Sodium  Buff-a-Comp Enoxaparin Orudis Zorpin  Buff-a-Comp with Codeine Equegesic Os-Cal-Gesic   Buffaprin Excedrin plain, buffered or Extra Strength Oxalid   Bufferin Arthritis Strength Feldene Oxphenbutazone   Bufferin plain or Extra Strength Feldene Capsules Oxycodone with Aspirin   Bufferin with Codeine Fenoprofen Fenoprofen Pabalate or Pabalate-SF   Buffets II Flogesic Panagesic   Buffinol plain or Extra Strength Florinal or Florinal with Codeine Panwarfarin   Buf-Tabs Flurbiprofen Penicillamine   Butalbital Compound Four-way cold tablets Penicillin   Butazolidin Fragmin Pepto-Bismol   Carbenicillin Geminisyn Percodan   Carna Arthritis Reliever Geopen Persantine   Carprofen Gold's salt Persistin   Chloramphenicol Goody's Phenylbutazone   Chloromycetin Haltrain  Piroxlcam   Clmetidine heparin Plaquenil   Cllnoril Hyco-pap Ponstel   Clofibrate Hydroxy chloroquine Propoxyphen         Before stopping any of these medications, be sure to consult the physician who ordered them.  Some, such as Coumadin (Warfarin) are ordered to prevent or treat serious conditions such as "deep thrombosis", "pumonary embolisms", and other heart problems.  The amount of time that you may need off of the medication may  also vary with the medication and the reason for which you were taking it.  If you are taking any of these medications, please make sure you notify your pain physician before you undergo any procedures.

## 2015-12-12 NOTE — Progress Notes (Signed)
Subjective:    Patient ID: Lorraine Jones, female    DOB: 11/06/1950, 65 y.o.   MRN: [id number]  HPI  The patient is a 65 year old female who returns to pain management for further evaluation and treatment of pain involving the lower back and lower extremity region. The patient states that she has had significant improvement with prior treatment in pain management center and wishes to proceed with further treatment to prevent the pain to return into the severity which it had been. We discussed patient's condition and patient continues fentanyl patches with oxycodone for breakthrough pain. Patient states that she continues to have significant pain which has significantly decreased as well since interventional treatment. We will proceed with block of nerves to the sacroiliac joint in attempt to decrease severity of symptoms, minimize progression of symptoms, and avoid the need for more involved treatment. The patient is with known degenerative joint disease of the hip and prefers to avoid surgical intervention of the lumbar region or hip at this time. Patient is with understanding and agreed to suggested treatment plan    Review of Systems     Objective:   Physical Exam  There was tenderness over the splenius capitis and occipitalis musculature regions palpation which reproduces mild discomfort. There was mild tenderness of the cervical facet and cervical paraspinal musculature region. There was mild tenderness of the acromioclavicular and glenohumeral joint region. The patient appeared to be with bilaterally equal grip strength and Tinel and Phalen's maneuver were without increased pain of significant degree. Palpation over the thoracic region thoracic facet region was with mild discomfort with no crepitus of the thoracic region noted. Palpation over the lumbar paraspinal musculature region lumbar facet region was attends to palpation of moderate degree with moderate tenderness over the PSIS and PII  S region. There was moderate tenderness of the gluteal and piriformis musculature regions as well. Straight leg raising was limited to approximately 20 without a definite increase of pain with dorsiflexion noted. Reevaluation revealed palpation of the PSIS and PII S regions reproduced in severely disabling pain. Palpation of the greater trochanteric region and iliotibial band region was with moderate tenderness to palpation. There was questionably decreased sensation of the L5 dermatomal distribution with negative clonus negative Homans. EHL strength appeared to be slightly decreased. No abdominal tends to palpation and no costovertebral angle tenderness noted.               Assessment & Plan:    Degenerative disc disease lumbar spine Status post prior surgical intervention lumbar region with L5-S1 scar formation, facet hypertrophy, and diffuse degenerative changes noted throughout the lumbar spine  Lumbar facet syndrome  Lumbar radiculopathy  DJD (hip)  Sacroiliac joint disease      PLAN   Continue present medication oxycodone and fentanyl patches.  NOTE: Do not apply 100 g per hour fentanyl patch since insurance will not approve the 100 g per hour fentanyl patch for you  Block of nerves to the sacroiliac joint to be performed at time of return appointment  F/U PCP Dr.Sowles  for evaliation of  BP diabetes mellitus and general medical  condition  F/U surgical evaluation. Surgical evaluation discussed and we will proceed with surgical evaluation once patient wishes to do such  F/U neurological evaluation. May consider PNCV EMG studies and other studies  May consider radiofrequency rhizolysis or intraspinal procedures pending response to present treatment and F/U evaluation   Patient to call Pain Management Center should patient have concerns prior  to scheduled return appointment. Diabetic neuropathy

## 2015-12-15 NOTE — Addendum Note (Signed)
Addended by: Steele Sizer F on: 12/15/2015 01:02 PM   Modules accepted: Miquel Dunn

## 2015-12-20 ENCOUNTER — Ambulatory Visit: Payer: Medicare Other | Attending: Pain Medicine | Admitting: Pain Medicine

## 2015-12-20 ENCOUNTER — Encounter: Payer: Self-pay | Admitting: Pain Medicine

## 2015-12-20 VITALS — BP 160/74 | HR 86 | Temp 98.6°F | Resp 16 | Ht 61.0 in | Wt 198.0 lb

## 2015-12-20 DIAGNOSIS — M545 Low back pain: Secondary | ICD-10-CM | POA: Insufficient documentation

## 2015-12-20 DIAGNOSIS — K5909 Other constipation: Secondary | ICD-10-CM

## 2015-12-20 DIAGNOSIS — M961 Postlaminectomy syndrome, not elsewhere classified: Secondary | ICD-10-CM

## 2015-12-20 DIAGNOSIS — M5481 Occipital neuralgia: Secondary | ICD-10-CM

## 2015-12-20 DIAGNOSIS — M791 Myalgia: Secondary | ICD-10-CM | POA: Diagnosis not present

## 2015-12-20 DIAGNOSIS — Z9889 Other specified postprocedural states: Secondary | ICD-10-CM | POA: Insufficient documentation

## 2015-12-20 DIAGNOSIS — M47817 Spondylosis without myelopathy or radiculopathy, lumbosacral region: Secondary | ICD-10-CM | POA: Diagnosis not present

## 2015-12-20 DIAGNOSIS — M160 Bilateral primary osteoarthritis of hip: Secondary | ICD-10-CM

## 2015-12-20 DIAGNOSIS — M5030 Other cervical disc degeneration, unspecified cervical region: Secondary | ICD-10-CM

## 2015-12-20 DIAGNOSIS — M533 Sacrococcygeal disorders, not elsewhere classified: Secondary | ICD-10-CM

## 2015-12-20 DIAGNOSIS — M48062 Spinal stenosis, lumbar region with neurogenic claudication: Secondary | ICD-10-CM

## 2015-12-20 DIAGNOSIS — M5136 Other intervertebral disc degeneration, lumbar region: Secondary | ICD-10-CM | POA: Insufficient documentation

## 2015-12-20 DIAGNOSIS — E1340 Other specified diabetes mellitus with diabetic neuropathy, unspecified: Secondary | ICD-10-CM

## 2015-12-20 MED ORDER — ORPHENADRINE CITRATE 30 MG/ML IJ SOLN
INTRAMUSCULAR | Status: AC
  Administered 2015-12-20: 60 mg via INTRAMUSCULAR
  Filled 2015-12-20: qty 2

## 2015-12-20 MED ORDER — MIDAZOLAM HCL 5 MG/5ML IJ SOLN
5.0000 mg | Freq: Once | INTRAMUSCULAR | Status: AC
  Administered 2015-12-20: 5 mg via INTRAVENOUS

## 2015-12-20 MED ORDER — TRIAMCINOLONE ACETONIDE 40 MG/ML IJ SUSP
40.0000 mg | Freq: Once | INTRAMUSCULAR | Status: AC
  Administered 2015-12-20: 40 mg

## 2015-12-20 MED ORDER — BUPIVACAINE HCL (PF) 0.25 % IJ SOLN
30.0000 mL | Freq: Once | INTRAMUSCULAR | Status: AC
  Administered 2015-12-20: 30 mL

## 2015-12-20 MED ORDER — TRIAMCINOLONE ACETONIDE 40 MG/ML IJ SUSP
INTRAMUSCULAR | Status: AC
  Administered 2015-12-20: 40 mg
  Filled 2015-12-20: qty 1

## 2015-12-20 MED ORDER — LACTATED RINGERS IV SOLN
1000.0000 mL | INTRAVENOUS | Status: DC
Start: 2015-12-20 — End: 2016-06-25

## 2015-12-20 MED ORDER — ORPHENADRINE CITRATE 30 MG/ML IJ SOLN
60.0000 mg | Freq: Once | INTRAMUSCULAR | Status: AC
  Administered 2015-12-20: 60 mg via INTRAMUSCULAR

## 2015-12-20 MED ORDER — BUPIVACAINE HCL (PF) 0.25 % IJ SOLN
INTRAMUSCULAR | Status: AC
  Administered 2015-12-20: 30 mL
  Filled 2015-12-20: qty 30

## 2015-12-20 MED ORDER — CEFUROXIME AXETIL 250 MG PO TABS: 250.0000 mg | ORAL_TABLET | Freq: Two times a day (BID) | ORAL | Status: DC

## 2015-12-20 MED ORDER — CEFAZOLIN SODIUM 1 G IJ SOLR
INTRAMUSCULAR | Status: AC
  Administered 2015-12-20: 1 g via INTRAVENOUS
  Filled 2015-12-20: qty 10

## 2015-12-20 MED ORDER — FENTANYL CITRATE (PF) 100 MCG/2ML IJ SOLN
INTRAMUSCULAR | Status: AC
  Administered 2015-12-20: 100 ug via INTRAVENOUS
  Filled 2015-12-20: qty 2

## 2015-12-20 MED ORDER — FENTANYL CITRATE (PF) 100 MCG/2ML IJ SOLN
100.0000 ug | Freq: Once | INTRAMUSCULAR | Status: AC
  Administered 2015-12-20: 100 ug via INTRAVENOUS

## 2015-12-20 MED ORDER — CEFAZOLIN SODIUM 1-5 GM-% IV SOLN: 1.0000 g | Freq: Once | INTRAVENOUS | Status: DC

## 2015-12-20 MED ORDER — MIDAZOLAM HCL 5 MG/5ML IJ SOLN
INTRAMUSCULAR | Status: AC
  Administered 2015-12-20: 5 mg via INTRAVENOUS
  Filled 2015-12-20: qty 5

## 2015-12-20 NOTE — Progress Notes (Signed)
Safety precautions to be maintained throughout the outpatient stay will include: orient to surroundings, keep bed in low position, maintain call bell within reach at all times, provide assistance with transfer out of bed and ambulation.  

## 2015-12-20 NOTE — Patient Instructions (Addendum)
PLAN  Continue present medication oxycodone and fentanyl patch and begin taking antibiotic Ceftin as prescribed. Please obtain your antibiotic today and begin taking antibiotic today  F/U PCP Dr.  Ancil Boozer for evaliation of  BP diabetes mellitus and general medical  condition.  F/U surgical evaluation. May consider pending follow-up evaluations  F/U neurological evaluation. May consider pending follow-up evaluations  May consider radiofrequency rhizolysis or intraspinal procedures pending response to present treatment and F/U evaluation.  Patient to call Pain Management Center should patient have concerns prior to scheduled return appointment.Pain Management Discharge Instructions  General Discharge Instructions :  If you need to reach your doctor call: Monday-Friday 8:00 am - 4:00 pm at [phone] or toll free [phone].  After clinic hours [phone] to have operator reach doctor.  Bring all of your medication bottles to all your appointments in the pain clinic.  To cancel or reschedule your appointment with Pain Management please remember to call 24 hours in advance to avoid a fee.  Refer to the educational materials which you have been given on: General Risks, I had my Procedure. Discharge Instructions, Post Sedation.  Post Procedure Instructions:  The drugs you were given will stay in your system until tomorrow, so for the next 24 hours you should not drive, make any legal decisions or drink any alcoholic beverages.  You may eat anything you prefer, but it is better to start with liquids then soups and crackers, and gradually work up to solid foods.  Please notify your doctor immediately if you have any unusual bleeding, trouble breathing or pain that is not related to your normal pain.  Depending on the type of procedure that was done, some parts of your body may feel week and/or numb.  This usually clears up by tonight or the next day.  Walk with the use of an  assistive device or accompanied by an adult for the 24 hours.  You may use ice on the affected area for the first 24 hours.  Put ice in a Ziploc bag and cover with a towel and place against area 15 minutes on 15 minutes off.  You may switch to heat after 24 hours.

## 2015-12-20 NOTE — Progress Notes (Signed)
Subjective:    Patient ID: Renold Don, female    DOB: 07-Mar-1951, 65 y.o.   MRN: [id number]  HPI  PROCEDURE:  Block of nerves to the sacroiliac joint.   NOTE:  The patient is a 65 y.o. female who returns to the [facility] for further evaluation and treatment of pain involving the lower back and lower extremity region with pain in the region of the buttocks as well. Prior MRI revealed patient to be with evidence of degenerative disc disease lumbar spine Status post prior surgical intervention lumbar region with L5-S1 scar formation, facet hypertrophy, and diffuse degenerative changes noted throughout the lumbar spine. The patient is with reproduction of severe pain with palpation over the PSIS and PII S regions.    There is concern regarding a significant component of the patient's pain being due to sacroiliac joint dysfunction The risks, benefits, expectations of the procedure have been discussed and explained to the patient who is understanding and willing to proceed with interventional treatment in attempt to decrease severity of patient's symptoms, minimize the risk of medication escalation and  hopefully retard the progression of the patient's symptoms. We will proceed with what is felt to be a medically necessary procedure, block of nerves to the sacroiliac joint.   DESCRIPTION OF PROCEDURE:  Block of nerves to the sacroiliac joint.   The patient was taken to the fluoroscopy suite. With the patient in the prone position with EKG, blood pressure, pulse and pulse oximetry monitoring, IV Versed, IV fentanyl conscious sedation, Betadine prep of proposed entry site was performed.   Block of nerves at the L5 vertebral body level.   With the patient in prone position, under fluoroscopic guidance, a 22 -gauge needle was inserted at the L5 vertebral body level on the left side. With 15 degrees oblique orientation a 22 -gauge needle was inserted in the region known as Burton's eye or eye  of the Scotty dog. Following documentation of needle placement in the area of Burton's eye or eye of the Scotty dog under fluoroscopic guidance, needle placement was then accomplished at the sacral ala level on the left side.   Needle placement at the sacral ala.   With the patient in prone position under fluoroscopic guidance with AP view of the lumbosacral spine, a 22 -gauge needle was inserted in the region known as the sacral ala on the left side. Following documentation of needle placement on the left side under fluoroscopic guidance needle placement was then accomplished at the S1 foramen level.   Needle placement at the S1 foramen level.   With the patient in prone position under fluoroscopic guidance with AP view of the lumbosacral spine and cephalad orientation, a 22 -gauge needle was inserted at the superior and lateral border of the S1 foramen on the left side. Following documentation of needle placement at the S1 foramen level on the left side, needle placement was then accomplished at the S2 foramen level on the left side.   Needle placement at the S2 foramen level.   With the patient in prone position with AP view of the lumbosacral spine with cephalad orientation, a 22 - gauge needle was inserted at the superior and lateral border of the S2 foramen under fluoroscopic guidance on the left side. Following needle placement at the L5 vertebral body level, sacral ala, S1 foramen and S2 foramen on the left side, needle placement was verified on lateral view under fluoroscopic guidance.  Following needle placement documentation on  lateral view, each needle was injected with 1 mL of 0.25% bupivacaine and Kenalog.   BLOCK OF THE NERVES TO SACROILIAC JOINT ON THE RIGHT SIDE The procedure was performed on the right side at the same levels as was performed on the left side and utilizing the same technique as on the left side and was performed under fluoroscopic guidance as on the left  side  Myoneural block injections of the gluteal musculature region Following Betadine prep of proposed entry site a 22-gauge needle was inserted in the gluteal musculature region and following negative aspiration 2 cc of 0.25% bupivacaine with Norflex was injected for myoneural block injection of the gluteal musculature region times two.   The patient tolerated the procedure well   A total of 10mg  of Kenalog was utilized for the procedure.   PLAN:  1. Medications: The patient will continue presently prescribed medications Oxycodone and fentanyl patch  2. The patient will be considered for modification of treatment regimen pending response to the procedure performed on today's visit.  3. The patient is to follow-up with primary care physician Dr. Ancil Boozer  for evaluation of blood pressure and general medical condition following the procedure performed on today's visit.  4. Surgical evaluation as discussed.  5. Neurological evaluation as discussed.  6. The patient may be a candidate for radiofrequency procedures, implantation devices and other treatment pending response to treatment performed on today's visit and follow-up evaluation.  7. The patient has been advised to adhere to proper body mechanics and to avoid activities which may exacerbate the patient's symptoms.   [facility].    Review of Systems     Objective:   Physical Exam        Assessment & Plan:

## 2015-12-21 ENCOUNTER — Telehealth: Payer: Self-pay | Admitting: *Deleted

## 2015-12-21 NOTE — Telephone Encounter (Signed)
Patient verbalizes no complications or concerns from procedure on yesterday.  

## 2015-12-28 ENCOUNTER — Emergency Department (HOSPITAL_COMMUNITY)
Admission: EM | Admit: 2015-12-28 | Discharge: 2015-12-28 | Disposition: A | Discharge status: Home / Self Care | Payer: Medicare Other | Attending: Emergency Medicine | Admitting: Emergency Medicine

## 2015-12-28 ENCOUNTER — Emergency Department (HOSPITAL_COMMUNITY): Payer: Medicare Other

## 2015-12-28 ENCOUNTER — Encounter (HOSPITAL_COMMUNITY): Payer: Self-pay | Admitting: Emergency Medicine

## 2015-12-28 DIAGNOSIS — Z8659 Personal history of other mental and behavioral disorders: Secondary | ICD-10-CM | POA: Diagnosis not present

## 2015-12-28 DIAGNOSIS — Z7982 Long term (current) use of aspirin: Secondary | ICD-10-CM | POA: Diagnosis not present

## 2015-12-28 DIAGNOSIS — I10 Essential (primary) hypertension: Secondary | ICD-10-CM | POA: Diagnosis not present

## 2015-12-28 DIAGNOSIS — M1990 Unspecified osteoarthritis, unspecified site: Secondary | ICD-10-CM | POA: Diagnosis not present

## 2015-12-28 DIAGNOSIS — S0990XA Unspecified injury of head, initial encounter: Secondary | ICD-10-CM | POA: Diagnosis not present

## 2015-12-28 DIAGNOSIS — E785 Hyperlipidemia, unspecified: Secondary | ICD-10-CM | POA: Diagnosis not present

## 2015-12-28 DIAGNOSIS — E119 Type 2 diabetes mellitus without complications: Secondary | ICD-10-CM | POA: Insufficient documentation

## 2015-12-28 DIAGNOSIS — G8929 Other chronic pain: Secondary | ICD-10-CM | POA: Diagnosis not present

## 2015-12-28 DIAGNOSIS — Z79899 Other long term (current) drug therapy: Secondary | ICD-10-CM | POA: Diagnosis not present

## 2015-12-28 DIAGNOSIS — Y9389 Activity, other specified: Secondary | ICD-10-CM | POA: Insufficient documentation

## 2015-12-28 DIAGNOSIS — Y998 Other external cause status: Secondary | ICD-10-CM | POA: Diagnosis not present

## 2015-12-28 DIAGNOSIS — Z7984 Long term (current) use of oral hypoglycemic drugs: Secondary | ICD-10-CM | POA: Insufficient documentation

## 2015-12-28 DIAGNOSIS — S3992XA Unspecified injury of lower back, initial encounter: Secondary | ICD-10-CM | POA: Diagnosis not present

## 2015-12-28 DIAGNOSIS — S199XXA Unspecified injury of neck, initial encounter: Secondary | ICD-10-CM | POA: Insufficient documentation

## 2015-12-28 DIAGNOSIS — F17210 Nicotine dependence, cigarettes, uncomplicated: Secondary | ICD-10-CM | POA: Diagnosis not present

## 2015-12-28 DIAGNOSIS — E1165 Type 2 diabetes mellitus with hyperglycemia: Secondary | ICD-10-CM | POA: Diagnosis not present

## 2015-12-28 DIAGNOSIS — R040 Epistaxis: Secondary | ICD-10-CM | POA: Insufficient documentation

## 2015-12-28 DIAGNOSIS — M542 Cervicalgia: Secondary | ICD-10-CM | POA: Diagnosis not present

## 2015-12-28 DIAGNOSIS — S0993XA Unspecified injury of face, initial encounter: Secondary | ICD-10-CM | POA: Diagnosis not present

## 2015-12-28 DIAGNOSIS — Z794 Long term (current) use of insulin: Secondary | ICD-10-CM | POA: Insufficient documentation

## 2015-12-28 DIAGNOSIS — Y92410 Unspecified street and highway as the place of occurrence of the external cause: Secondary | ICD-10-CM | POA: Diagnosis not present

## 2015-12-28 DIAGNOSIS — R51 Headache: Secondary | ICD-10-CM | POA: Diagnosis not present
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
## 2015-12-28 NOTE — ED Notes (Signed)
[facility] in with pt.

## 2015-12-28 NOTE — Discharge Instructions (Signed)
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
## 2016-01-11 ENCOUNTER — Ambulatory Visit: Payer: Medicare Other | Attending: Pain Medicine | Admitting: Pain Medicine

## 2016-01-11 ENCOUNTER — Encounter: Payer: Self-pay | Admitting: Pain Medicine

## 2016-01-11 VITALS — BP 128/74 | HR 98 | Temp 98.2°F | Resp 18 | Ht 61.0 in | Wt 193.0 lb

## 2016-01-11 DIAGNOSIS — E1340 Other specified diabetes mellitus with diabetic neuropathy, unspecified: Secondary | ICD-10-CM

## 2016-01-11 DIAGNOSIS — M48062 Spinal stenosis, lumbar region with neurogenic claudication: Secondary | ICD-10-CM

## 2016-01-11 DIAGNOSIS — M533 Sacrococcygeal disorders, not elsewhere classified: Secondary | ICD-10-CM | POA: Insufficient documentation

## 2016-01-11 DIAGNOSIS — M5481 Occipital neuralgia: Secondary | ICD-10-CM

## 2016-01-11 DIAGNOSIS — H40053 Ocular hypertension, bilateral: Secondary | ICD-10-CM | POA: Diagnosis not present

## 2016-01-11 DIAGNOSIS — I10 Essential (primary) hypertension: Secondary | ICD-10-CM | POA: Diagnosis not present

## 2016-01-11 DIAGNOSIS — Z9889 Other specified postprocedural states: Secondary | ICD-10-CM | POA: Diagnosis not present

## 2016-01-11 DIAGNOSIS — M79604 Pain in right leg: Secondary | ICD-10-CM | POA: Diagnosis present

## 2016-01-11 DIAGNOSIS — M47896 Other spondylosis, lumbar region: Secondary | ICD-10-CM | POA: Insufficient documentation

## 2016-01-11 DIAGNOSIS — M47817 Spondylosis without myelopathy or radiculopathy, lumbosacral region: Secondary | ICD-10-CM | POA: Diagnosis not present

## 2016-01-11 DIAGNOSIS — E119 Type 2 diabetes mellitus without complications: Secondary | ICD-10-CM | POA: Insufficient documentation

## 2016-01-11 DIAGNOSIS — M545 Low back pain: Secondary | ICD-10-CM | POA: Diagnosis present

## 2016-01-11 DIAGNOSIS — H40023 Open angle with borderline findings, high risk, bilateral: Secondary | ICD-10-CM | POA: Diagnosis not present

## 2016-01-11 DIAGNOSIS — M5030 Other cervical disc degeneration, unspecified cervical region: Secondary | ICD-10-CM

## 2016-01-11 DIAGNOSIS — M961 Postlaminectomy syndrome, not elsewhere classified: Secondary | ICD-10-CM

## 2016-01-11 DIAGNOSIS — Z794 Long term (current) use of insulin: Secondary | ICD-10-CM | POA: Diagnosis not present

## 2016-01-11 DIAGNOSIS — M791 Myalgia: Secondary | ICD-10-CM | POA: Diagnosis not present

## 2016-01-11 DIAGNOSIS — M461 Sacroiliitis, not elsewhere classified: Secondary | ICD-10-CM | POA: Diagnosis not present

## 2016-01-11 DIAGNOSIS — M160 Bilateral primary osteoarthritis of hip: Secondary | ICD-10-CM

## 2016-01-11 DIAGNOSIS — M5116 Intervertebral disc disorders with radiculopathy, lumbar region: Secondary | ICD-10-CM | POA: Diagnosis not present

## 2016-01-11 DIAGNOSIS — M5136 Other intervertebral disc degeneration, lumbar region: Secondary | ICD-10-CM

## 2016-01-11 DIAGNOSIS — M1610 Unilateral primary osteoarthritis, unspecified hip: Secondary | ICD-10-CM | POA: Diagnosis not present

## 2016-01-11 DIAGNOSIS — M5416 Radiculopathy, lumbar region: Secondary | ICD-10-CM | POA: Diagnosis not present

## 2016-01-11 MED ORDER — FENTANYL 75 MCG/HR TD PT72: MEDICATED_PATCH | TRANSDERMAL | Status: DC

## 2016-01-11 MED ORDER — OXYCODONE HCL 10 MG PO TABS: ORAL_TABLET | ORAL | Status: DC

## 2016-01-11 NOTE — Patient Instructions (Addendum)
PLAN   Continue present medication oxycodone and fentanyl patches.  NOTE: Do not apply 100 g per hour fentanyl patch since insurance will not approve the 100 g per hour fentanyl patch for you  F/U PCP Dr.Sowles  for evaliation of  BP diabetes mellitus and general medical  condition  F/U surgical evaluation. Surgical evaluation discussed and we will proceed with surgical evaluation once patient wishes to do such  F/U neurological evaluation. May consider [facility] EMG studies and other studies  May consider radiofrequency rhizolysis or intraspinal procedures pending response to present treatment and F/U evaluation   Patient to call [facility] should patient have concerns prior to scheduled return appointment.GENERAL RISKS AND COMPLICATIONS  What are the risk, side effects and possible complications? Generally speaking, most procedures are safe.  However, with any procedure there are risks, side effects, and the possibility of complications.  The risks and complications are dependent upon the sites that are lesioned, or the type of nerve block to be performed.  The closer the procedure is to the spine, the more serious the risks are.  Great care is taken when placing the radio frequency needles, block needles or lesioning probes, but sometimes complications can occur. 1. Infection: Any time there is an injection through the skin, there is a risk of infection.  This is why sterile conditions are used for these blocks.  There are four possible types of infection. 1. Localized skin infection. 2. Central Nervous System Infection-This can be in the form of Meningitis, which can be deadly. 3. Epidural Infections-This can be in the form of an epidural abscess, which can cause pressure inside of the spine, causing compression of the spinal cord with subsequent paralysis. This would require an emergency surgery to decompress, and there are no guarantees that the patient would recover from the  paralysis. 4. Discitis-This is an infection of the intervertebral discs.  It occurs in about 1% of discography procedures.  It is difficult to treat and it may lead to surgery.        2. Pain: the needles have to go through skin and soft tissues, will cause soreness.       3. Damage to internal structures:  The nerves to be lesioned may be near blood vessels or    other nerves which can be potentially damaged.       4. Bleeding: Bleeding is more common if the patient is taking blood thinners such as  aspirin, Coumadin, Ticiid, Plavix, etc., or if he/she have some genetic predisposition  such as hemophilia. Bleeding into the spinal canal can cause compression of the spinal  cord with subsequent paralysis.  This would require an emergency surgery to  decompress and there are no guarantees that the patient would recover from the  paralysis.       5. Pneumothorax:  Puncturing of a lung is a possibility, every time a needle is introduced in  the area of the chest or upper back.  Pneumothorax refers to free air around the  collapsed lung(s), inside of the thoracic cavity (chest cavity).  Another two possible  complications related to a similar event would include: Hemothorax and Chylothorax.   These are variations of the Pneumothorax, where instead of air around the collapsed  lung(s), you may have blood or chyle, respectively.       6. Spinal headaches: They may occur with any procedures in the area of the spine.       7. Persistent CSF (Cerebro-Spinal  Fluid) leakage: This is a rare problem, but may occur  with prolonged intrathecal or epidural catheters either due to the formation of a fistulous  track or a dural tear.       8. Nerve damage: By working so close to the spinal cord, there is always a possibility of  nerve damage, which could be as serious as a permanent spinal cord injury with  paralysis.       9. Death:  Although rare, severe deadly allergic reactions known as "Anaphylactic  reaction" can  occur to any of the medications used.      10. Worsening of the symptoms:  We can always make thing worse.  What are the chances of something like this happening? Chances of any of this occuring are extremely low.  By statistics, you have more of a chance of getting killed in a motor vehicle accident: while driving to the hospital than any of the above occurring .  Nevertheless, you should be aware that they are possibilities.  In general, it is similar to taking a shower.  Everybody knows that you can slip, hit your head and get killed.  Does that mean that you should not shower again?  Nevertheless always keep in mind that statistics do not mean anything if you happen to be on the wrong side of them.  Even if a procedure has a 1 (one) in a 1,000,000 (million) chance of going wrong, it you happen to be that one..Also, keep in mind that by statistics, you have more of a chance of having something go wrong when taking medications.  Who should not have this procedure? If you are on a blood thinning medication (e.g. Coumadin, Plavix, see list of "Blood Thinners"), or if you have an active infection going on, you should not have the procedure.  If you are taking any blood thinners, please inform your physician.  How should I prepare for this procedure?  Do not eat or drink anything at least six hours prior to the procedure.  Bring a driver with you .  It cannot be a taxi.  Come accompanied by an adult that can drive you back, and that is strong enough to help you if your legs get weak or numb from the local anesthetic.  Take all of your medicines the morning of the procedure with just enough water to swallow them.  If you have diabetes, make sure that you are scheduled to have your procedure done first thing in the morning, whenever possible.  If you have diabetes, take only half of your insulin dose and notify our nurse that you have done so as soon as you arrive at the clinic.  If you are  diabetic, but only take blood sugar pills (oral hypoglycemic), then do not take them on the morning of your procedure.  You may take them after you have had the procedure.  Do not take aspirin or any aspirin-containing medications, at least eleven (11) days prior to the procedure.  They may prolong bleeding.  Wear loose fitting clothing that may be easy to take off and that you would not mind if it got stained with Betadine or blood.  Do not wear any jewelry or perfume  Remove any nail coloring.  It will interfere with some of our monitoring equipment.  NOTE: Remember that this is not meant to be interpreted as a complete list of all possible complications.  Unforeseen problems may occur.  BLOOD THINNERS The following drugs contain  aspirin or other products, which can cause increased bleeding during surgery and should not be taken for 2 weeks prior to and 1 week after surgery.  If you should need take something for relief of minor pain, you may take acetaminophen which is found in Tylenol,m Datril, Anacin-3 and Panadol. It is not blood thinner. The products listed below are.  Do not take any of the products listed below in addition to any listed on your instruction sheet.  A.P.C or A.P.C with Codeine Codeine Phosphate Capsules #3 Ibuprofen Ridaura  ABC compound Congesprin Imuran rimadil  Advil Cope Indocin Robaxisal  Alka-Seltzer Effervescent Pain Reliever and Antacid Coricidin or Coricidin-D  Indomethacin Rufen  Alka-Seltzer plus Cold Medicine Cosprin Ketoprofen S-A-C Tablets  Anacin Analgesic Tablets or Capsules Coumadin Korlgesic Salflex  Anacin Extra Strength Analgesic tablets or capsules CP-2 Tablets Lanoril Salicylate  Anaprox Cuprimine Capsules Levenox Salocol  Anexsia-D Dalteparin Magan Salsalate  Anodynos Darvon compound Magnesium Salicylate Sine-off  Ansaid Dasin Capsules Magsal Sodium Salicylate  Anturane Depen Capsules Marnal Soma  APF Arthritis pain formula Dewitt's Pills  Measurin Stanback  Argesic Dia-Gesic Meclofenamic Sulfinpyrazone  Arthritis Bayer Timed Release Aspirin Diclofenac Meclomen Sulindac  Arthritis pain formula Anacin Dicumarol Medipren Supac  Analgesic (Safety coated) Arthralgen Diffunasal Mefanamic Suprofen  Arthritis Strength Bufferin Dihydrocodeine Mepro Compound Suprol  Arthropan liquid Dopirydamole Methcarbomol with Aspirin Synalgos  ASA tablets/Enseals Disalcid Micrainin Tagament  Ascriptin Doan's Midol Talwin  Ascriptin A/D Dolene Mobidin Tanderil  Ascriptin Extra Strength Dolobid Moblgesic Ticlid  Ascriptin with Codeine Doloprin or Doloprin with Codeine Momentum Tolectin  Asperbuf Duoprin Mono-gesic Trendar  Aspergum Duradyne Motrin or Motrin IB Triminicin  Aspirin plain, buffered or enteric coated Durasal Myochrisine Trigesic  Aspirin Suppositories Easprin Nalfon Trillsate  Aspirin with Codeine Ecotrin Regular or Extra Strength Naprosyn Uracel  Atromid-S Efficin Naproxen Ursinus  Auranofin Capsules Elmiron Neocylate Vanquish  Axotal Emagrin Norgesic Verin  Azathioprine Empirin or Empirin with Codeine Normiflo Vitamin E  Azolid Emprazil Nuprin Voltaren  Bayer Aspirin plain, buffered or children's or timed BC Tablets or powders Encaprin Orgaran Warfarin Sodium  Buff-a-Comp Enoxaparin Orudis Zorpin  Buff-a-Comp with Codeine Equegesic Os-Cal-Gesic   Buffaprin Excedrin plain, buffered or Extra Strength Oxalid   Bufferin Arthritis Strength Feldene Oxphenbutazone   Bufferin plain or Extra Strength Feldene Capsules Oxycodone with Aspirin   Bufferin with Codeine Fenoprofen Fenoprofen Pabalate or Pabalate-SF   Buffets II Flogesic Panagesic   Buffinol plain or Extra Strength Florinal or Florinal with Codeine Panwarfarin   Buf-Tabs Flurbiprofen Penicillamine   Butalbital Compound Four-way cold tablets Penicillin   Butazolidin Fragmin Pepto-Bismol   Carbenicillin Geminisyn Percodan   Carna Arthritis Reliever Geopen Persantine    Carprofen Gold's salt Persistin   Chloramphenicol Goody's Phenylbutazone   Chloromycetin Haltrain Piroxlcam   Clmetidine heparin Plaquenil   Cllnoril Hyco-pap Ponstel   Clofibrate Hydroxy chloroquine Propoxyphen         Before stopping any of these medications, be sure to consult the physician who ordered them.  Some, such as Coumadin (Warfarin) are ordered to prevent or treat serious conditions such as "deep thrombosis", "pumonary embolisms", and other heart problems.  The amount of time that you may need off of the medication may also vary with the medication and the reason for which you were taking it.  If you are taking any of these medications, please make sure you notify your pain physician before you undergo any procedures.

## 2016-01-11 NOTE — Progress Notes (Signed)
Subjective:    Patient ID: Lorraine Jones, female    DOB: 1951/06/29, 65 y.o.   MRN: [id number]  HPI   The patient is a 65 year old female who returns to pain management for further evaluation and treatment of pain involving the lower back lower extremity region especially on the right. The patient is with remarkably decreased pain following block of nerves to the sacroiliac joint. The patient is with degenerative joint disease of the hip as well. We discussed additional interventional treatment for treatment of patient's pain involving the lower back and the region of the hip and present time we'll avoid additional interventional treatment since patient continues to do very well following block of nerves to the sacroiliac joint. The patient also is with diabetes mellitus and has been concern regarding component of pain due to diabetic neuropathy. The patient will continue fentanyl patches 75 g per hour and will apply 2 patches every 2 days through the skin and we'll continue oxycodone for breakthrough pain. All are in agreement with suggested treatment plan    Review of Systems     Objective:   Physical Exam  There was tenderness over the paraspinal musculature region cervical region cervical facet region palpation which reproduces pain of mild degree with mild tenderness of the splenius capitis and occipitalis musculature region. Palpation over the region of the thoracic facet thoracic paraspinal musculature region was attends to palpation of minimal degree in the upper mid thoracic region a mild degree in the lower thoracic paraspinal musculature region with no crepitus of the thoracic region noted. Palpation over the splenius capitis and occipitalis muscles reproduces minimal discomfort. Palpation of the acromioclavicular and glenohumeral joint regions were attends to palpation of mild degree and patient appeared to be with unremarkable Spurling's maneuver and appeared to be with bilaterally  equal grip strength. Tinel and Phalen's maneuver were without increased pain of significant degree. Palpation over the lumbar paraspinal must reason lumbar facet region was attends to palpation of moderate degree with lateral bending rotation extension and palpation of the lumbar facets reproducing moderate discomfort. There was minimal tenderness of the greater trochanteric region and iliotibial band region. There was tenderness over the PSIS and PII S region on the right greater than left. Patient with mild difficulty standing on tiptoes and heels. Straight leg raise was tolerates approximately 30 without a definite increased pain with dorsiflexion noted. There was questionably decreased sensation of the L5 dermatomal distribution with negative clonus negative Homans. Abdomen nontender and no costovertebral tenderness noted      Assessment & Plan:      Degenerative disc disease lumbar spine Status post prior surgical intervention lumbar region with L5-S1 scar formation, facet hypertrophy, and diffuse degenerative changes noted throughout the lumbar spine  Lumbar facet syndrome  Lumbar radiculopathy  DJD (hip)  Sacroiliac joint disease       PLAN   Continue present medication oxycodone and fentanyl patches.  NOTE: Do not apply 100 g per hour fentanyl patch since insurance will not approve the 100 g per hour fentanyl patch for you  F/U PCP Dr.Sowles  for evaliation of  BP diabetes mellitus and general medical  condition  F/U surgical evaluation. Surgical evaluation discussed and we will proceed with surgical evaluation once patient wishes to do such  F/U neurological evaluation. May consider [facility] EMG studies and other studies  May consider radiofrequency rhizolysis or intraspinal procedures pending response to present treatment and F/U evaluation   Patient to call [facility]  should patient have concerns prior to scheduled return appointment
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
## 2016-02-06 ENCOUNTER — Encounter: Payer: Self-pay | Admitting: Family Medicine

## 2016-02-08 ENCOUNTER — Ambulatory Visit: Payer: Medicare Other | Attending: Pain Medicine | Admitting: Pain Medicine

## 2016-02-08 ENCOUNTER — Encounter: Payer: Self-pay | Admitting: Pain Medicine

## 2016-02-08 VITALS — BP 108/57 | HR 94 | Temp 98.0°F | Resp 16 | Ht 61.0 in | Wt 193.0 lb

## 2016-02-08 DIAGNOSIS — M791 Myalgia: Secondary | ICD-10-CM | POA: Diagnosis not present

## 2016-02-08 DIAGNOSIS — M5416 Radiculopathy, lumbar region: Secondary | ICD-10-CM | POA: Diagnosis not present

## 2016-02-08 DIAGNOSIS — M5030 Other cervical disc degeneration, unspecified cervical region: Secondary | ICD-10-CM

## 2016-02-08 DIAGNOSIS — M461 Sacroiliitis, not elsewhere classified: Secondary | ICD-10-CM | POA: Diagnosis not present

## 2016-02-08 DIAGNOSIS — M47817 Spondylosis without myelopathy or radiculopathy, lumbosacral region: Secondary | ICD-10-CM | POA: Diagnosis not present

## 2016-02-08 DIAGNOSIS — M160 Bilateral primary osteoarthritis of hip: Secondary | ICD-10-CM

## 2016-02-08 DIAGNOSIS — E1340 Other specified diabetes mellitus with diabetic neuropathy, unspecified: Secondary | ICD-10-CM

## 2016-02-08 DIAGNOSIS — M5136 Other intervertebral disc degeneration, lumbar region: Secondary | ICD-10-CM

## 2016-02-08 DIAGNOSIS — M533 Sacrococcygeal disorders, not elsewhere classified: Secondary | ICD-10-CM

## 2016-02-08 DIAGNOSIS — M5481 Occipital neuralgia: Secondary | ICD-10-CM

## 2016-02-08 DIAGNOSIS — M961 Postlaminectomy syndrome, not elsewhere classified: Secondary | ICD-10-CM

## 2016-02-08 DIAGNOSIS — K5909 Other constipation: Secondary | ICD-10-CM

## 2016-02-08 DIAGNOSIS — M48062 Spinal stenosis, lumbar region with neurogenic claudication: Secondary | ICD-10-CM

## 2016-02-08 MED ORDER — FENTANYL 75 MCG/HR TD PT72: MEDICATED_PATCH | TRANSDERMAL | Status: DC

## 2016-02-08 MED ORDER — OXYCODONE HCL 10 MG PO TABS: ORAL_TABLET | ORAL | Status: DC

## 2016-02-08 NOTE — Progress Notes (Signed)
   Subjective:    Patient ID: Lorraine Jones, female    DOB: [birth year], 65 y.o.   MRN: [id number]  HPI  The patient is a 65 year old female who returns to pain management for further evaluation and treatment of pain involving the lower back and lower extremity region on the right predominantly. The patient is with known degenerative joint disease of the hip and is with prior surgical intervention of the lumbar region. The patient also is with history of diabetes mellitus. Patient has had improvement of her pain with interventional treatment and continues medications consisting of fentanyl patch with oxycodone for breakthrough pain. We discussed patient's condition on today's visit and will consider patient for interventional treatment consisting of femoral nerve block and operator nerve block was severely debilitating pain involving the region of the right hip. The patient was with understanding and in agreement with suggested treatment plan. The patient denied any trauma change in events of daily living the call significant change in symptoms tolerated. All agreed to suggested treatment plan.     Review of Systems     Objective:   Physical Exam   There was tenderness to palpation of paraspinal muscular treat the cervical and cervical facet region of mild degree with mild tenderness of the splenius capitis and occipitalis musculature region. Palpation of the acromioclavicular and glenohumeral joint regions reproduce mild discomfort. Patient appeared to be with slightly decreased grip strength with Tinel and Phalen's maneuver reproducing minimal discomfort. The thoracic region thoracic facet region was attends to palpation of moderate degree in the lower thoracic paraspinal musculatures with no crepitus of the thoracic region noted. Palpation over the lumbar paraspinal musculatures and lumbar facet region was associated with severe disabling pain with lateral bending rotation extension and palpation  of the lumbar facets reproduced severely disabling pain of the right greater than the left. There was severe tenderness of the PSIS and PII S region as well as the gluteal and piriformis musculature region. Patient was with positive Patrick's maneuver on the right especially. EHL strength appeared to be decreased. There was question decreased sensation of the L5 dermatomal distribution with negative clonus negative Homans. Abdomen nontender with no costovertebral tenderness noted Portion of patient's pain was reproduced when attempted Patrick's maneuver which cause severe disabling pain of the right hip and with palpation over the PSIS and PII S regions     Assessment & Plan:    Degenerative disc disease lumbar spine Status post prior surgical intervention lumbar region with L5-S1 scar formation, facet hypertrophy, and diffuse degenerative changes noted throughout the lumbar spine  Lumbar facet syndrome  Lumbar radiculopathy  DJD (hip)  Sacroiliac joint disease     PLAN   Continue present medication oxycodone and fentanyl patches.  NOTE: Do not apply 100 g per hour fentanyl patch since insurance will not approve the 100 g per hour fentanyl patch for you  Femoral and obturator nerve blocks to be performed at time of return appointment for right hip pain  F/U PCP Dr.Sowles  for evaliation of  BP diabetes mellitus and general medical  condition  F/U surgical evaluation. Surgical evaluation discussed and we will proceed with surgical evaluation once patient wishes to do such  F/U neurological evaluation. May consider [facility] EMG studies and other studies  May consider radiofrequency rhizolysis or intraspinal procedures pending response to present treatment and F/U evaluation   Patient to call [facility] Management Center should patient have concerns prior to scheduled return appointment.

## 2016-02-08 NOTE — Progress Notes (Signed)
Safety precautions to be maintained throughout the outpatient stay will include: orient to surroundings, keep bed in low position, maintain call bell within reach at all times, provide assistance with transfer out of bed and ambulation.  

## 2016-02-08 NOTE — Patient Instructions (Addendum)
PLAN   Continue present medication oxycodone and fentanyl patches.  NOTE: Do not apply 100 g per hour fentanyl patch since insurance will not approve the 100 g per hour fentanyl patch for you  Femoral and obturator nerve blocks to be performed at time of return appointment for right hip pain  F/U PCP Dr.Sowles  for evaliation of  BP diabetes mellitus and general medical  condition  F/U surgical evaluation. Surgical evaluation discussed and we will proceed with surgical evaluation once patient wishes to do such  F/U neurological evaluation. May consider [facility] EMG studies and other studies  May consider radiofrequency rhizolysis or intraspinal procedures pending response to present treatment and F/U evaluation   Patient to call Pain Management Center should patient have concerns prior to scheduled return appointment.Lateral Femoral Cutaneous Nerve Block Patient Information  Description: The lateral femoral cutaneous nerve of the thigh is a purely sensory nerve that can become entrapped or irritated for a number of reasons.  The pain associated with this condition is called meralgia paraesthetica.  Patients affected with this syndrome have burning pain or abnormal sensation along the lateral aspect of the thigh.  The pain can be worsened by prolonged walking, standing, or constrictive garments around the house.   The diagnosis can be confirmed and treatment initiated by blocking the nerve with local anesthetic (like Novocaine).  At times, a steroid solution may be injected at the same time.  The site of injection is through a tiny needle in the left, lower quadrant of the abdomen.   The entire block usually lasts less than 5 minutes.  Conditions which may be treated by lateral femoral cutaneous nerve block:   Meralagia paraesthetica  Preparation for the injection:  1. Do not eat any solid food or dairy products within 8 hours of your appointment.  2. You may drink clear liquids up to 3  hours before appointment.  Clear liquids include water, black coffee, juice or soda. No milk or cream please. 3. You may take your regular medication, including pain medications, with a sip of water before your appointment.  Diabetics should hold regular insulin (if taken separately) and take 1/2 normal NPH dose the morning of the procedure.  Carry some sugar containing items with you to your appointment. 4. A driver must accompany you and be prepared to drive you home after your procedure. 5. Bring all you current medications with you 6. An IV may be inserted and sedation may be given at the discretion of the physician. 7. A blood pressure cuff, EKG and other monitors will often be applied during the procedure.  Some patients may need to have extra oxygen administered for a short period. 8. You will be asked to provide medical information, including your allergies and medications, prior to the procedure.  We must know immediately if you are taking blood thinners (like Coumadin/Warfarin) or if you allergic to IV iodine contrast (dye)  We must know if you could possible be pregnant.   Possible side-effects:   Bleeding from needle site  Infection (rate, may require surgery)  Nerve injury (rare)  Numbness and Tingling (temporary)  Light-headedness (temporary)  Pain at injection site (several day)  Decreased blood pressure (rare, temporary)  Weakness in leg (temporary)  Call if you experience:  Hives or difficulty breathing (go to the emergency room)  Inflammation or drainage at the injection site(s)  Please note:  Although the local anesthetic injected can often make your leg feel good for several hours after  the injection,  The pain may return.  It takes 3-7 days for steroids to work.  You may not notice any pain relief for at least one week.  If effective, we will often do a series of injections spaced 3-6 weeks apart to maximally decrease your pain.  If you have any questions,  please cll [phone] [facility]

## 2016-02-17 LAB — TOXASSURE SELECT 13 (MW), URINE

## 2016-02-19 NOTE — Progress Notes (Signed)
Quick Note:  Reviewed. ______ 

## 2016-02-21 DIAGNOSIS — Z794 Long term (current) use of insulin: Secondary | ICD-10-CM | POA: Diagnosis not present

## 2016-02-21 DIAGNOSIS — E1165 Type 2 diabetes mellitus with hyperglycemia: Secondary | ICD-10-CM | POA: Diagnosis not present

## 2016-02-21 LAB — BASIC METABOLIC PANEL
BUN: 9 mg/dL (ref 4–21)
GLUCOSE: 184 mg/dL
Potassium: 4.2 mmol/L (ref 3.4–5.3)
SODIUM: 137 mmol/L (ref 137–147)

## 2016-02-21 LAB — HEMOGLOBIN A1C: HEMOGLOBIN A1C: 8.1

## 2016-02-21 LAB — LIPID PANEL
Cholesterol: 183 mg/dL (ref 0–200)
HDL: 62 mg/dL (ref 35–70)
LDL CALC: 86 mg/dL
Triglycerides: 172 mg/dL — AB (ref 40–160)

## 2016-02-27 DIAGNOSIS — Z794 Long term (current) use of insulin: Secondary | ICD-10-CM | POA: Diagnosis not present

## 2016-02-27 DIAGNOSIS — E1165 Type 2 diabetes mellitus with hyperglycemia: Secondary | ICD-10-CM | POA: Diagnosis not present

## 2016-02-27 DIAGNOSIS — Z79899 Other long term (current) drug therapy: Secondary | ICD-10-CM | POA: Diagnosis not present

## 2016-03-04 ENCOUNTER — Encounter: Payer: Self-pay | Admitting: Family Medicine

## 2016-03-04 ENCOUNTER — Ambulatory Visit (INDEPENDENT_AMBULATORY_CARE_PROVIDER_SITE_OTHER): Payer: Medicare Other | Admitting: Family Medicine

## 2016-03-04 VITALS — BP 118/60 | HR 107 | Temp 98.4°F | Resp 18 | Ht 61.0 in | Wt 197.2 lb

## 2016-03-04 DIAGNOSIS — E1165 Type 2 diabetes mellitus with hyperglycemia: Secondary | ICD-10-CM | POA: Diagnosis not present

## 2016-03-04 DIAGNOSIS — E785 Hyperlipidemia, unspecified: Secondary | ICD-10-CM | POA: Diagnosis not present

## 2016-03-04 DIAGNOSIS — G8929 Other chronic pain: Secondary | ICD-10-CM | POA: Diagnosis not present

## 2016-03-04 DIAGNOSIS — I10 Essential (primary) hypertension: Secondary | ICD-10-CM

## 2016-03-04 DIAGNOSIS — K219 Gastro-esophageal reflux disease without esophagitis: Secondary | ICD-10-CM | POA: Diagnosis not present

## 2016-03-04 DIAGNOSIS — E2839 Other primary ovarian failure: Secondary | ICD-10-CM | POA: Diagnosis not present

## 2016-03-04 DIAGNOSIS — IMO0002 Reserved for concepts with insufficient information to code with codable children: Secondary | ICD-10-CM

## 2016-03-04 DIAGNOSIS — Z23 Encounter for immunization: Secondary | ICD-10-CM | POA: Diagnosis not present

## 2016-03-04 DIAGNOSIS — E1340 Other specified diabetes mellitus with diabetic neuropathy, unspecified: Secondary | ICD-10-CM | POA: Diagnosis not present

## 2016-03-04 DIAGNOSIS — E1143 Type 2 diabetes mellitus with diabetic autonomic (poly)neuropathy: Secondary | ICD-10-CM | POA: Diagnosis not present

## 2016-03-04 NOTE — Progress Notes (Signed)
Name: Lorraine Jones   MRN: [id number]    DOB: May 27, 1951   Date:03/04/2016       Progress Note  Subjective  Chief Complaint  Chief Complaint  Patient presents with  . Medication Refill    4 month F/U, is seeing Dr. Mirna Mires at [facility] for DM and has brought her sugar (A1C) down from 10.5 to 8.1  . Hypertension  . Hyperlipidemia  . Pain    Unchanged     HPI  DMII with neuropathy and gastroparesis: she is now on  Metformin and Novolog 24 units before meals and Lantus 50 units daily and Trulicity since March 0000000. Seeing Dr. Filbert Berthold. She states she has been compliant with her diet and medications, checking glucose more often, 3-4 times daily and glucose has been around 150  She denies polydipsia, polyuria or polyphagia. She is still getting steroid injections from the pain clinic, and states when she is in a lot of pain her glucose gets even higher. She is taking statins and aspirin daily also on ARB. She states her neuropathy is under control at this time, and indigestion is under control with Reglan before meals. No longer takes PPI. She is worried about the cost of her medications and the fact that she is already on the doughnut hole.   HTN: she needs refill of Tribenzor and carvedilol, she is compliant with medication and bp is at goal. No chest pain or palpitation, no side effects of medication  Chronic Pain: she sees Dr. Primus Bravo at least once a month, taking pain medication as prescribed, she has constipation secondary to narcotics and is taking Miralax to control symptoms, she does not want to switch to Linzess of Amit[place] at this time.   Hyperlipidemia: taking Pravastatin and denies side effects, discussed switching to Atorvastatin but she is afraid of changing.    Patient Active Problem List   Diagnosis Date Noted  . Gastroesophageal reflux disease without esophagitis 05/12/2015  . Chronic pain 05/12/2015  . Hyperlipidemia 05/12/2015  . DDD (degenerative disc disease),  lumbar 02/27/2015  . Lumbar post-laminectomy syndrome 02/27/2015  . Neuropathy due to secondary diabetes ([place]) 02/27/2015  . Spinal stenosis, lumbar region, with neurogenic claudication 02/27/2015  . Sacroiliac joint dysfunction of both sides 02/27/2015  . Degenerative joint disease (DJD) of hip 02/27/2015  . DDD (degenerative disc disease), cervical 02/27/2015  . Bilateral occipital neuralgia 02/27/2015  . Chronic constipation 06/02/2014  . Hyponatremia 06/01/2014  . Hypertension, benign 06/01/2014    Past Surgical History  Procedure Laterality Date  . Cholecystectomy  1996  . Lumbar disc surgery  1993    L4  --  L5  . Cystoscopy with ureteroscopy Right 09/01/2013    Procedure: CYSTOSCOPY WITH UNROOFING  RIGHT URETEROCELE AND URETERAL STONE REMOVED  WITH GYRUS COLLINS KNIFE. ;  Surgeon: Irine Seal, MD;  Location: [place];  Service: Urology;  Laterality: Right;  cysto, right uretersoscopy and stone extraction   collins knife    Family History  Problem Relation Age of Onset  . Diabetes Mother   . Heart disease Mother   . Hypertension Mother     Social History   Social History  . Marital Status: Married    Spouse Name: N/A  . Number of Children: N/A  . Years of Education: N/A   Occupational History  . Not on file.   Social History Main Topics  . Smoking status: Current Every Day Smoker -- 0.50 packs/day for 12 years  Types: Cigarettes  . Smokeless tobacco: Never Used  . Alcohol Use: No  . Drug Use: No  . Sexual Activity: Yes   Other Topics Concern  . Not on file   Social History Narrative     Current outpatient prescriptions:  .  aspirin EC 81 MG tablet, Take 81 mg by mouth daily., Disp: , Rfl:  .  carvedilol (COREG) 3.125 MG tablet, Take 1 tablet (3.125 mg total) by mouth 2 (two) times daily with a meal., Disp: 180 tablet, Rfl: 1 .  cefUROXime (CEFTIN) 250 MG tablet, Take 1 tablet (250 mg total) by mouth 2 (two) times daily with a meal.,  Disp: 14 tablet, Rfl: 0 .  dorzolamide-timolol (COSOPT) 22.3-6.8 MG/ML ophthalmic solution, Place 1 drop into both eyes 2 (two) times daily., Disp: , Rfl:  .  Dulaglutide (TRULICITY) [id number] [id number] SOPN, Inject 0.75 mg into the skin once a week., Disp: 4 pen, Rfl: 0 .  Exenatide ER (BYDUREON) 2 MG PEN, Inject into the skin., Disp: , Rfl:  .  fentaNYL (DURAGESIC - DOSED MCG/HR) 75 MCG/HR, Apply 2 patches to skin every 2 days if tolerated.   NOTE: Do not apply 100 g per hour fentanyl patch since insurance will not approve 100 g patch for you, Disp: 30 patch, Rfl: 0 .  insulin aspart (NOVOLOG) 100 UNIT/ML FlexPen, Inject into the skin., Disp: , Rfl:  .  Insulin Lispro (HUMALOG KWIKPEN) 200 UNIT/ML SOPN, Inject 24 Units into the skin 3 (three) times daily before meals., Disp: 45 mL, Rfl: 1 .  Insulin Pen Needle (PEN NEEDLES 31GX5/16") 31G X 8 MM MISC, , Disp: , Rfl:  .  LANTUS SOLOSTAR 100 UNIT/ML Solostar Pen, INJECT 50 UNITS INTO THE SKIN DAILY AT 10 PM., Disp: 15 pen, Rfl: 5 .  metFORMIN (GLUCOPHAGE) 1000 MG tablet, Take 1 tablet (1,000 mg total) by mouth 2 (two) times daily with a meal., Disp: 180 tablet, Rfl: 1 .  metoCLOPramide (REGLAN) 5 MG tablet, Take 1 tablet (5 mg total) by mouth 3 (three) times daily., Disp: 270 tablet, Rfl: 1 .  Multiple Vitamins-Minerals (MULTIVITAMIN WITH MINERALS) tablet, Take 1 tablet by mouth daily., Disp: , Rfl:  .  Olmesartan-Amlodipine-HCTZ (TRIBENZOR) 40-10-25 MG TABS, Take 1 tablet by mouth daily., Disp: 90 tablet, Rfl: 1 .  Oxycodone HCl 10 MG TABS, Limit 1 tab by mouth 4-8 times per for breakthrough pain while wearing fentanyl patches day if tolerated, Disp: 200 tablet, Rfl: 0 .  polyethylene glycol (MIRALAX / GLYCOLAX) packet, Take 17 g by mouth daily., Disp: 14 each, Rfl: 0 .  pravastatin (PRAVACHOL) 40 MG tablet, Take 1 tablet (40 mg total) by mouth daily., Disp: 90 tablet, Rfl: 1  Current facility-administered medications:  .  bupivacaine (PF) (MARCAINE)  0.25 % injection 30 mL, 30 mL, Other, Once, Mohammed Kindle, MD .  bupivacaine (PF) (MARCAINE) 0.25 % injection 30 mL, 30 mL, Other, Once, Mohammed Kindle, MD .  ceFAZolin (ANCEF) IVPB 1 g/50 mL premix, 1 g, Intravenous, Once, Mohammed Kindle, MD .  ceFAZolin (ANCEF) IVPB 1 g/50 mL premix, 1 g, Intravenous, Once, Mohammed Kindle, MD .  ceFAZolin (ANCEF) IVPB 1 g/50 mL premix, 1 g, Intravenous, Once, Mohammed Kindle, MD .  fentaNYL (SUBLIMAZE) injection 100 mcg, 100 mcg, Intravenous, Once, Mohammed Kindle, MD .  fentaNYL (SUBLIMAZE) injection 100 mcg, 100 mcg, Intravenous, Once, Mohammed Kindle, MD .  lactated ringers infusion 1,000 mL, 1,000 mL, Intravenous, Continuous, Mohammed Kindle, MD .  lactated ringers infusion 1,000 mL,  1,000 mL, Intravenous, Continuous, Mohammed Kindle, MD .  lactated ringers infusion 1,000 mL, 1,000 mL, Intravenous, Continuous, Mohammed Kindle, MD .  lidocaine (PF) (XYLOCAINE) 1 % injection 10 mL, 10 mL, Subcutaneous, Once, Mohammed Kindle, MD .  lidocaine (PF) (XYLOCAINE) 1 % injection 10 mL, 10 mL, Subcutaneous, Once, Mohammed Kindle, MD .  midazolam (VERSED) 5 MG/5ML injection 5 mg, 5 mg, Intravenous, Once, Mohammed Kindle, MD .  midazolam (VERSED) 5 MG/5ML injection 5 mg, 5 mg, Intravenous, Once, Mohammed Kindle, MD .  orphenadrine (NORFLEX) injection 60 mg, 60 mg, Intramuscular, Once, Mohammed Kindle, MD .  orphenadrine (NORFLEX) injection 60 mg, 60 mg, Intramuscular, Once, Mohammed Kindle, MD .  triamcinolone acetonide [facility]) injection 40 mg, 40 mg, Other, Once, Mohammed Kindle, MD .  triamcinolone acetonide (KENALOG-40) injection 40 mg, 40 mg, Other, Once, Mohammed Kindle, MD  Allergies  Allergen Reactions  . Diclofenac Sodium Swelling  . Lodine [Etodolac] Swelling  . Naproxen Sodium Swelling  . Neurontin [Gabapentin] Swelling     ROS  Constitutional: Negative for fever or weight change.  Respiratory: Negative for cough and shortness of breath.   Cardiovascular: Negative for chest  pain or palpitations.  Gastrointestinal: Negative for abdominal pain, no bowel changes.  Musculoskeletal: Positive  for gait problem ( secondary to back pain ) but no joint swelling.  Skin: Negative for rash.  Neurological: Negative for dizziness or headache.  No other specific complaints in a complete review of systems (except as listed in HPI above).  Objective  Filed Vitals:   03/04/16 1101  BP: 118/60  Pulse: 107  Temp: 98.4 F (36.9 C)  TempSrc: Oral  Resp: 18  Height: 5\' 1"  (1.549 m)  Weight: 197 lb 3.2 oz (89.449 kg)  SpO2: 95%    Body mass index is 37.28 kg/(m^2).  Physical Exam  Constitutional: Patient appears well-developed and well-nourished. Obese No distress.  HEENT: head atraumatic, normocephalic, pupils equal and reactive to light, neck supple, throat within normal limits Cardiovascular: Normal rate, regular rhythm and normal heart sounds. No murmur heard. No BLE edema. Pulmonary/Chest: Effort normal and breath sounds normal. No respiratory distress. Abdominal: Soft. There is no tenderness. Psychiatric: Patient has a normal mood and affect. behavior is normal. Judgment and thought content normal. Muscular skeletal: she has low back pain during palpation with negative straight leg raise          Recent Results (from the past 2160 hour(s))  PapLb, HPV, rfx16/18     Status: None   Collection Time: 12/06/15 12:00 AM  Result Value Ref Range   DIAGNOSIS: Comment     Comment: NEGATIVE FOR INTRAEPITHELIAL LESION AND MALIGNANCY.   Specimen adequacy: Comment     Comment: Satisfactory for evaluation. Endocervical and/or squamous metaplastic cells (endocervical component) are present.    CLINICIAN PROVIDED ICD10: Comment     Comment: Z12.4   Performed by: Comment     Comment: Malvin Johns, Cytotechnologist (ASCP)   PAP SMEAR COMMENT .    Note: Comment     Comment: The Pap smear is a screening test designed to aid in the detection of premalignant and  malignant conditions of the uterine cervix.  It is not a diagnostic procedure and should not be used as the sole means of detecting cervical cancer.  Both false-positive and false-negative reports do occur.    HPV, high-risk Negative Negative    Comment: This high-risk HPV test detects thirteen high-risk types (16/18/31/33/35/39/45/51/52/56/58/59/68) without differentiation.   ToxASSURE Select 13 (MW), Urine  Status: None   Collection Time: 02/08/16  8:08 AM  Result Value Ref Range   ToxAssure Select 13 FINAL     Comment: ==================================================================== TOXASSURE SELECT 13 (MW) ==================================================================== Test                             Result       Flag       Units Drug Present and Declared for Prescription Verification   Oxycodone                      >3226        EXPECTED   ng/mg creat   Oxymorphone                    1495         EXPECTED   ng/mg creat   Noroxycodone                   >3226        EXPECTED   ng/mg creat   Noroxymorphone                 340          EXPECTED   ng/mg creat    Sources of oxycodone are scheduled prescription medications.    Oxymorphone, noroxycodone, and noroxymorphone are expected    metabolites of oxycodone. Oxymorphone is also available as a    scheduled prescription medication.   Fentanyl                       >161         EXPECTED   ng/mg creat   Norfentanyl                    >323         EXPECTED   ng/mg creat    Source of fentanyl is a scheduled prescription medication,     including IV, patch, and transmucosal formulations. Norfentanyl    is an expected metabolite of fentanyl. ==================================================================== Test                      Result    Flag   Units      Ref Range   Creatinine              310              mg/dL      >=20 ==================================================================== Declared Medications:   The flagging and interpretation on this report are based on the  following declared medications.  Unexpected results may arise from  inaccuracies in the declared medications.  **Note: The testing scope of this panel includes these medications:  Fentanyl (Duragesic)  Oxycodone  **Note: The testing scope of this panel does not include following  reported medications:  Amlodipine  Aspirin (Aspirin 81)  Carvedilol (Coreg)  Cefuroxime axetil (Ceftin)  Dorzolamide  Dulaglutide  Hydrochlorothiazide  Insulin  Insulin (Lantus)  Metformin (Glucophage)  Metoclopramide (Reglan)  Multi vitamin (MVI)  Olmesartan  Polyethylene Glycol (MiraLAX)  Pravastatin (Pravachol)  Timolol ==================================================================== For clinical consultation, please call [phone]. ====================================================================   Basic metabolic panel     Status: None   Collection Time: 02/21/16 12:00 AM  Result Value Ref Range   Glucose 184 mg/dL   BUN 9 4 - 21 mg/dL   Potassium 4.2 3.4 -  5.3 mmol/L   Sodium 137 137 - 147 mmol/L  Lipid panel     Status: Abnormal   Collection Time: 02/21/16 12:00 AM  Result Value Ref Range   Triglycerides 172 (A) 40 - 160 mg/dL   Cholesterol 183 0 - 200 mg/dL   HDL 62 35 - 70 mg/dL   LDL Cholesterol 86 mg/dL  Hemoglobin A1c     Status: None   Collection Time: 02/21/16 12:00 AM  Result Value Ref Range   Hemoglobin A1C 8.1     PHQ2/9: Depression screen [facility] 2/9 03/04/2016 02/08/2016 12/06/2015 11/01/2015 09/20/2015  Decreased Interest 0 0 0 0 0  Down, Depressed, Hopeless 0 0 0 1 0  PHQ - 2 Score 0 0 0 1 0  Altered sleeping - - - - -  Tired, decreased energy - - - - -  Change in appetite - - - - -  Feeling bad or failure about yourself  - - - - -  Trouble concentrating - - - - -  Moving slowly or fidgety/restless - - - - -  Suicidal thoughts - - - - -  PHQ-9 Score - - - - -  Difficult doing work/chores - - -  - -    Fall Risk: Fall Risk  03/04/2016 02/08/2016 01/11/2016 12/20/2015 12/12/2015  Falls in the past year? No No No No No  Risk for fall due to : - - - - -     Functional Status Survey: Is the patient deaf or have difficulty hearing?: No Does the patient have difficulty seeing, even when wearing glasses/contacts?: No Does the patient have difficulty concentrating, remembering, or making decisions?: No Does the patient have difficulty walking or climbing stairs?: No Does the patient have difficulty dressing or bathing?: No Does the patient have difficulty doing errands alone such as visiting a doctor's office or shopping?: No    Assessment & Plan  1. Hypertension, benign  bp is at goal   2. 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 Hyperlipidemia  Does not want to change statin therapy   4. Chronic pain  Continue follow up with Dr. Primus Bravo  5. Neuropathy due to secondary diabetes ([place])  stable  6. Gastroesophageal reflux disease without esophagitis  Doing well at this time  7. Ovarian failure  - DG Bone Density; Future  8. Need for pneumococcal vaccine  - Pneumococcal conjugate vaccine 13-valent IM

## 2016-03-06 ENCOUNTER — Ambulatory Visit: Payer: Medicare Other | Attending: Pain Medicine | Admitting: Pain Medicine

## 2016-03-06 ENCOUNTER — Encounter: Payer: Self-pay | Admitting: Pain Medicine

## 2016-03-06 VITALS — BP 130/60 | HR 91 | Temp 98.0°F | Resp 18 | Ht 61.0 in | Wt 197.0 lb

## 2016-03-06 DIAGNOSIS — M5481 Occipital neuralgia: Secondary | ICD-10-CM

## 2016-03-06 DIAGNOSIS — M533 Sacrococcygeal disorders, not elsewhere classified: Secondary | ICD-10-CM

## 2016-03-06 DIAGNOSIS — E1340 Other specified diabetes mellitus with diabetic neuropathy, unspecified: Secondary | ICD-10-CM

## 2016-03-06 DIAGNOSIS — M5030 Other cervical disc degeneration, unspecified cervical region: Secondary | ICD-10-CM

## 2016-03-06 DIAGNOSIS — M5136 Other intervertebral disc degeneration, lumbar region: Secondary | ICD-10-CM

## 2016-03-06 DIAGNOSIS — M1611 Unilateral primary osteoarthritis, right hip: Secondary | ICD-10-CM | POA: Diagnosis not present

## 2016-03-06 DIAGNOSIS — M48062 Spinal stenosis, lumbar region with neurogenic claudication: Secondary | ICD-10-CM

## 2016-03-06 DIAGNOSIS — M5416 Radiculopathy, lumbar region: Secondary | ICD-10-CM | POA: Diagnosis not present

## 2016-03-06 DIAGNOSIS — M25551 Pain in right hip: Secondary | ICD-10-CM | POA: Diagnosis present

## 2016-03-06 DIAGNOSIS — M160 Bilateral primary osteoarthritis of hip: Secondary | ICD-10-CM

## 2016-03-06 DIAGNOSIS — G5720 Lesion of femoral nerve, unspecified lower limb: Secondary | ICD-10-CM | POA: Diagnosis not present

## 2016-03-06 DIAGNOSIS — M961 Postlaminectomy syndrome, not elsewhere classified: Secondary | ICD-10-CM

## 2016-03-06 MED ORDER — IOPAMIDOL (ISOVUE-M 200) INJECTION 41%
INTRAMUSCULAR | Status: AC
  Administered 2016-03-06: 3 mL
  Filled 2016-03-06: qty 10

## 2016-03-06 MED ORDER — CEFUROXIME AXETIL 250 MG PO TABS: 250.0000 mg | ORAL_TABLET | Freq: Two times a day (BID) | ORAL | Status: DC

## 2016-03-06 MED ORDER — ORPHENADRINE CITRATE 30 MG/ML IJ SOLN
60.0000 mg | Freq: Once | INTRAMUSCULAR | Status: DC
  Filled 2016-03-06: qty 2

## 2016-03-06 MED ORDER — TRIAMCINOLONE ACETONIDE 40 MG/ML IJ SUSP
INTRAMUSCULAR | Status: AC
  Filled 2016-03-06: qty 1

## 2016-03-06 MED ORDER — LACTATED RINGERS IV SOLN: 1000.0000 mL | INTRAVENOUS | Status: DC

## 2016-03-06 MED ORDER — LIDOCAINE HCL (PF) 1 % IJ SOLN
10.0000 mL | Freq: Once | INTRAMUSCULAR | Status: AC
  Administered 2016-03-06: 10 mL via SUBCUTANEOUS
  Filled 2016-03-06: qty 10

## 2016-03-06 MED ORDER — MIDAZOLAM HCL 5 MG/5ML IJ SOLN
5.0000 mg | Freq: Once | INTRAMUSCULAR | Status: AC
  Administered 2016-03-06: 3 mg via INTRAVENOUS
  Filled 2016-03-06: qty 5

## 2016-03-06 MED ORDER — BUPIVACAINE HCL (PF) 0.25 % IJ SOLN
30.0000 mL | Freq: Once | INTRAMUSCULAR | Status: AC
  Administered 2016-03-06: 4 mL
  Filled 2016-03-06: qty 30

## 2016-03-06 MED ORDER — CEFAZOLIN SODIUM 1 G IJ SOLR
INTRAMUSCULAR | Status: AC
  Administered 2016-03-06: 1 g
  Filled 2016-03-06: qty 10

## 2016-03-06 MED ORDER — FENTANYL 75 MCG/HR TD PT72: MEDICATED_PATCH | TRANSDERMAL | Status: DC

## 2016-03-06 MED ORDER — OXYCODONE HCL 10 MG PO TABS: ORAL_TABLET | ORAL | Status: DC

## 2016-03-06 MED ORDER — CEFAZOLIN SODIUM 1-5 GM-% IV SOLN
1.0000 g | Freq: Once | INTRAVENOUS | Status: AC
  Administered 2016-03-06: 1 g via INTRAVENOUS

## 2016-03-06 MED ORDER — FENTANYL CITRATE (PF) 100 MCG/2ML IJ SOLN
100.0000 ug | Freq: Once | INTRAMUSCULAR | Status: AC
  Administered 2016-03-06: 50 ug via INTRAVENOUS
  Filled 2016-03-06: qty 2

## 2016-03-06 MED ORDER — SODIUM CHLORIDE 0.9% FLUSH: 20.0000 mL | Freq: Once | INTRAVENOUS | Status: DC

## 2016-03-06 NOTE — Patient Instructions (Addendum)
PLAN  Continue present medication oxycodone and fentanyl patch and begin taking antibiotic Ceftin as prescribed. Please obtain your antibiotic today and begin taking antibiotic today  F/U PCP Dr.  Ancil Boozer for evaliation of  BP diabetes mellitus and general medical  condition.  F/U surgical evaluation. May consider pending follow-up evaluations  F/U neurological evaluation. May consider pending follow-up evaluations  May consider radiofrequency rhizolysis or intraspinal procedures pending response to present treatment and F/U evaluation.  Patient to call Pain Management Center should patient have concerns prior to scheduled return appointment.Pain Management Discharge Instructions  General Discharge Instructions :  If you need to reach your doctor call: Monday-Friday 8:00 am - 4:00 pm at [phone] or toll free [phone].  After clinic hours [phone] to have operator reach doctor.  Bring all of your medication bottles to all your appointments in the pain clinic.  To cancel or reschedule your appointment with Pain Management please remember to call 24 hours in advance to avoid a fee.  Refer to the educational materials which you have been given on: General Risks, I had my Procedure. Discharge Instructions, Post Sedation.  Post Procedure Instructions:  The drugs you were given will stay in your system until tomorrow, so for the next 24 hours you should not drive, make any legal decisions or drink any alcoholic beverages.  You may eat anything you prefer, but it is better to start with liquids then soups and crackers, and gradually work up to solid foods.  Please notify your doctor immediately if you have any unusual bleeding, trouble breathing or pain that is not related to your normal pain.  Depending on the type of procedure that was done, some parts of your body may feel week and/or numb.  This usually clears up by tonight or the next day.  Walk with the use of an  assistive device or accompanied by an adult for the 24 hours.  You may use ice on the affected area for the first 24 hours.  Put ice in a Ziploc bag and cover with a towel and place against area 15 minutes on 15 minutes off.  You may switch to heat after 24 hours.GENERAL RISKS AND COMPLICATIONS  What are the risk, side effects and possible complications? Generally speaking, most procedures are safe.  However, with any procedure there are risks, side effects, and the possibility of complications.  The risks and complications are dependent upon the sites that are lesioned, or the type of nerve block to be performed.  The closer the procedure is to the spine, the more serious the risks are.  Great care is taken when placing the radio frequency needles, block needles or lesioning probes, but sometimes complications can occur. 1. Infection: Any time there is an injection through the skin, there is a risk of infection.  This is why sterile conditions are used for these blocks.  There are four possible types of infection. 1. Localized skin infection. 2. Central Nervous System Infection-This can be in the form of Meningitis, which can be deadly. 3. Epidural Infections-This can be in the form of an epidural abscess, which can cause pressure inside of the spine, causing compression of the spinal cord with subsequent paralysis. This would require an emergency surgery to decompress, and there are no guarantees that the patient would recover from the paralysis. 4. Discitis-This is an infection of the intervertebral discs.  It occurs in about 1% of discography procedures.  It is difficult to treat and it may lead to  surgery.        2. Pain: the needles have to go through skin and soft tissues, will cause soreness.       3. Damage to internal structures:  The nerves to be lesioned may be near blood vessels or    other nerves which can be potentially damaged.       4. Bleeding: Bleeding is more common if the patient  is taking blood thinners such as  aspirin, Coumadin, Ticiid, Plavix, etc., or if he/she have some genetic predisposition  such as hemophilia. Bleeding into the spinal canal can cause compression of the spinal  cord with subsequent paralysis.  This would require an emergency surgery to  decompress and there are no guarantees that the patient would recover from the  paralysis.       5. Pneumothorax:  Puncturing of a lung is a possibility, every time a needle is introduced in  the area of the chest or upper back.  Pneumothorax refers to free air around the  collapsed lung(s), inside of the thoracic cavity (chest cavity).  Another two possible  complications related to a similar event would include: Hemothorax and Chylothorax.   These are variations of the Pneumothorax, where instead of air around the collapsed  lung(s), you may have blood or chyle, respectively.       6. Spinal headaches: They may occur with any procedures in the area of the spine.       7. Persistent CSF (Cerebro-Spinal Fluid) leakage: This is a rare problem, but may occur  with prolonged intrathecal or epidural catheters either due to the formation of a fistulous  track or a dural tear.       8. Nerve damage: By working so close to the spinal cord, there is always a possibility of  nerve damage, which could be as serious as a permanent spinal cord injury with  paralysis.       9. Death:  Although rare, severe deadly allergic reactions known as "Anaphylactic  reaction" can occur to any of the medications used.      10. Worsening of the symptoms:  We can always make thing worse.  What are the chances of something like this happening? Chances of any of this occuring are extremely low.  By statistics, you have more of a chance of getting killed in a motor vehicle accident: while driving to the hospital than any of the above occurring .  Nevertheless, you should be aware that they are possibilities.  In general, it is similar to taking a shower.   Everybody knows that you can slip, hit your head and get killed.  Does that mean that you should not shower again?  Nevertheless always keep in mind that statistics do not mean anything if you happen to be on the wrong side of them.  Even if a procedure has a 1 (one) in a 1,000,000 (million) chance of going wrong, it you happen to be that one..Also, keep in mind that by statistics, you have more of a chance of having something go wrong when taking medications.  Who should not have this procedure? If you are on a blood thinning medication (e.g. Coumadin, Plavix, see list of "Blood Thinners"), or if you have an active infection going on, you should not have the procedure.  If you are taking any blood thinners, please inform your physician.  How should I prepare for this procedure?  Do not eat or drink anything at least six hours  prior to the procedure.  Bring a driver with you .  It cannot be a taxi.  Come accompanied by an adult that can drive you back, and that is strong enough to help you if your legs get weak or numb from the local anesthetic.  Take all of your medicines the morning of the procedure with just enough water to swallow them.  If you have diabetes, make sure that you are scheduled to have your procedure done first thing in the morning, whenever possible.  If you have diabetes, take only half of your insulin dose and notify our nurse that you have done so as soon as you arrive at the clinic.  If you are diabetic, but only take blood sugar pills (oral hypoglycemic), then do not take them on the morning of your procedure.  You may take them after you have had the procedure.  Do not take aspirin or any aspirin-containing medications, at least eleven (11) days prior to the procedure.  They may prolong bleeding.  Wear loose fitting clothing that may be easy to take off and that you would not mind if it got stained with Betadine or blood.  Do not wear any jewelry or perfume  Remove  any nail coloring.  It will interfere with some of our monitoring equipment.  NOTE: Remember that this is not meant to be interpreted as a complete list of all possible complications.  Unforeseen problems may occur.  BLOOD THINNERS The following drugs contain aspirin or other products, which can cause increased bleeding during surgery and should not be taken for 2 weeks prior to and 1 week after surgery.  If you should need take something for relief of minor pain, you may take acetaminophen which is found in Tylenol,m Datril, Anacin-3 and Panadol. It is not blood thinner. The products listed below are.  Do not take any of the products listed below in addition to any listed on your instruction sheet.  A.P.C or A.P.C with Codeine Codeine Phosphate Capsules #3 Ibuprofen Ridaura  ABC compound Congesprin Imuran rimadil  Advil Cope Indocin Robaxisal  Alka-Seltzer Effervescent Pain Reliever and Antacid Coricidin or Coricidin-D  Indomethacin Rufen  Alka-Seltzer plus Cold Medicine Cosprin Ketoprofen S-A-C Tablets  Anacin Analgesic Tablets or Capsules Coumadin Korlgesic Salflex  Anacin Extra Strength Analgesic tablets or capsules CP-2 Tablets Lanoril Salicylate  Anaprox Cuprimine Capsules Levenox Salocol  Anexsia-D Dalteparin Magan Salsalate  Anodynos Darvon compound Magnesium Salicylate Sine-off  Ansaid Dasin Capsules Magsal Sodium Salicylate  Anturane Depen Capsules Marnal Soma  APF Arthritis pain formula Dewitt's Pills Measurin Stanback  Argesic Dia-Gesic Meclofenamic Sulfinpyrazone  Arthritis Bayer Timed Release Aspirin Diclofenac Meclomen Sulindac  Arthritis pain formula Anacin Dicumarol Medipren Supac  Analgesic (Safety coated) Arthralgen Diffunasal Mefanamic Suprofen  Arthritis Strength Bufferin Dihydrocodeine Mepro Compound Suprol  Arthropan liquid Dopirydamole Methcarbomol with Aspirin Synalgos  ASA tablets/Enseals Disalcid Micrainin Tagament  Ascriptin Doan's Midol Talwin  Ascriptin A/D  Dolene Mobidin Tanderil  Ascriptin Extra Strength Dolobid Moblgesic Ticlid  Ascriptin with Codeine Doloprin or Doloprin with Codeine Momentum Tolectin  Asperbuf Duoprin Mono-gesic Trendar  Aspergum Duradyne Motrin or Motrin IB Triminicin  Aspirin plain, buffered or enteric coated Durasal Myochrisine Trigesic  Aspirin Suppositories Easprin Nalfon Trillsate  Aspirin with Codeine Ecotrin Regular or Extra Strength Naprosyn Uracel  Atromid-S Efficin Naproxen Ursinus  Auranofin Capsules Elmiron Neocylate Vanquish  Axotal Emagrin Norgesic Verin  Azathioprine Empirin or Empirin with Codeine Normiflo Vitamin E  Azolid Emprazil Nuprin Voltaren  Bayer Aspirin plain, buffered  or children's or timed BC Tablets or powders Encaprin Orgaran Warfarin Sodium  Buff-a-Comp Enoxaparin Orudis Zorpin  Buff-a-Comp with Codeine Equegesic Os-Cal-Gesic   Buffaprin Excedrin plain, buffered or Extra Strength Oxalid   Bufferin Arthritis Strength Feldene Oxphenbutazone   Bufferin plain or Extra Strength Feldene Capsules Oxycodone with Aspirin   Bufferin with Codeine Fenoprofen Fenoprofen Pabalate or Pabalate-SF   Buffets II Flogesic Panagesic   Buffinol plain or Extra Strength Florinal or Florinal with Codeine Panwarfarin   Buf-Tabs Flurbiprofen Penicillamine   Butalbital Compound Four-way cold tablets Penicillin   Butazolidin Fragmin Pepto-Bismol   Carbenicillin Geminisyn Percodan   Carna Arthritis Reliever Geopen Persantine   Carprofen Gold's salt Persistin   Chloramphenicol Goody's Phenylbutazone   Chloromycetin Haltrain Piroxlcam   Clmetidine heparin Plaquenil   Cllnoril Hyco-pap Ponstel   Clofibrate Hydroxy chloroquine Propoxyphen         Before stopping any of these medications, be sure to consult the physician who ordered them.  Some, such as Coumadin (Warfarin) are ordered to prevent or treat serious conditions such as "deep thrombosis", "pumonary embolisms", and other heart problems.  The amount of time  that you may need off of the medication may also vary with the medication and the reason for which you were taking it.  If you are taking any of these medications, please make sure you notify your pain physician before you undergo any procedures.

## 2016-03-06 NOTE — Progress Notes (Signed)
Subjective:    Patient ID: Lorraine Jones, female    DOB: May 22, 1951, 65 y.o.   MRN: [id number]  HPI                              FEMORAL AND OBTURATOR NERVE BLOCKS FOR RIGHT HIP PAIN    The patient is a 65 -year-old female who returns to pain management for further evaluation and treatment of severely debilitating right hip pain.  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 Decision has been made to proceed with block of the articular branches of the femoral and obturator nerves to decrease the severity of patient's symptoms, minimize progression of patient's symptoms, and avoid the need for more involved treatment. The risks, benefits, and expectations of the procedure have been discussed with and explained to the patient who is with understanding and is in agreement to proceed with block of the articular branches of the femoral and obturator nerves to treat pain of the hip. All are in agreement with suggested treatment plan.      DESCRIPTION OF PROCEDURE: Femoral And Obturator Articular Branch Nerve Blocks    The patient was taken to fluoroscopy suite and assumed the supine position. EKG, blood pressure, pulse, pulse oximetry, and capnograph monitors were all in place. IV Versed and IV fentanyl conscious sedation was obtained. Betadine prep of proposed needle entry sites was accomplished.  Block Of The Articular Branch Of Femoral Nerve   Under fluoroscopic guidance a local anesthetic skin wheal of 1.5% preservative-free lidocaine was accomplished at the proposed needle entry site for needle entry for block of the articular branch of the femoral nerve. Under fluoroscopic guidance a 22-gauge needle was inserted at the 12:00 position of the acetabulum and slightly superior to the acetabular rim.. One ml of Isovue contrast was injected via  the 22-gauge needle to confirm accurate placement of the needle for block of the  articular branch of the femoral nerve. Following documentation of accurate needle placement, 1 ml of 0.25% bupivacaine was injected for block of the articular branch of the femoral nerve.  Block Of The Articular Branch Of Obturator Nerve  Under fluoroscopic guidance a local anesthetic skin wheal of 1.5% preservative-free lidocaine was accomplished at the proposed needle entry site for needle entry for block of the articular branch of the obturator nerve. Under fluoroscopic guidance a 22-gauge needle was inserted at the incisura of the acetabulum until contact of bone was accomplished.(The needle was placed in the region known as the teardrop formation).  1 ml of Isovue was injected via the 22-gauge needle under fluoroscopic guidance for confirmation of accurate needle placement for block of the articular branch of the obturator nerve. Following documentation of accurate needle placement, 1 ml of 0.25% bupivacaine was injected. the 22-gauge needle.    The patient tolerated the procedure well    PLAN  Continue present medications oxycodone and fentanyl patches  F/U PCP Dr Ancil Boozer for evaliation of  BP and general medical  condition  F/U surgical evaluation as discussed.   F/U neurological evaluation. May consider [facility] EMG studies and other studies as discussed  May consider radiofrequency rhizolysis or intraspinal procedures pending response to present treatment and F/U evaluation   Patient to call Pain Management Center should patient have concerns prior to scheduled return appointment.    Review of Systems     Objective:  Physical Exam        Assessment & Plan:

## 2016-03-06 NOTE — Progress Notes (Signed)
Safety precautions to be maintained throughout the outpatient stay will include: orient to surroundings, keep bed in low position, maintain call bell within reach at all times, provide assistance with transfer out of bed and ambulation.  

## 2016-03-07 ENCOUNTER — Telehealth: Payer: Self-pay | Admitting: *Deleted

## 2016-03-07 NOTE — Telephone Encounter (Signed)
Spoke with patient re; appt on yesterday.  Verbalizes no questions or concerns.

## 2016-04-09 ENCOUNTER — Encounter: Payer: Self-pay | Admitting: Pain Medicine

## 2016-04-09 ENCOUNTER — Ambulatory Visit: Payer: Medicare Other | Attending: Pain Medicine | Admitting: Pain Medicine

## 2016-04-09 VITALS — BP 141/60 | HR 88 | Temp 98.4°F | Resp 16 | Ht 61.0 in | Wt 193.0 lb

## 2016-04-09 DIAGNOSIS — M5116 Intervertebral disc disorders with radiculopathy, lumbar region: Secondary | ICD-10-CM | POA: Diagnosis not present

## 2016-04-09 DIAGNOSIS — M1610 Unilateral primary osteoarthritis, unspecified hip: Secondary | ICD-10-CM | POA: Insufficient documentation

## 2016-04-09 DIAGNOSIS — M48062 Spinal stenosis, lumbar region with neurogenic claudication: Secondary | ICD-10-CM

## 2016-04-09 DIAGNOSIS — M47817 Spondylosis without myelopathy or radiculopathy, lumbosacral region: Secondary | ICD-10-CM | POA: Diagnosis not present

## 2016-04-09 DIAGNOSIS — E1340 Other specified diabetes mellitus with diabetic neuropathy, unspecified: Secondary | ICD-10-CM

## 2016-04-09 DIAGNOSIS — M4726 Other spondylosis with radiculopathy, lumbar region: Secondary | ICD-10-CM | POA: Insufficient documentation

## 2016-04-09 DIAGNOSIS — M5481 Occipital neuralgia: Secondary | ICD-10-CM

## 2016-04-09 DIAGNOSIS — M533 Sacrococcygeal disorders, not elsewhere classified: Secondary | ICD-10-CM | POA: Insufficient documentation

## 2016-04-09 DIAGNOSIS — Z9889 Other specified postprocedural states: Secondary | ICD-10-CM | POA: Insufficient documentation

## 2016-04-09 DIAGNOSIS — M5136 Other intervertebral disc degeneration, lumbar region: Secondary | ICD-10-CM

## 2016-04-09 DIAGNOSIS — M160 Bilateral primary osteoarthritis of hip: Secondary | ICD-10-CM

## 2016-04-09 DIAGNOSIS — M5416 Radiculopathy, lumbar region: Secondary | ICD-10-CM | POA: Diagnosis not present

## 2016-04-09 DIAGNOSIS — M5030 Other cervical disc degeneration, unspecified cervical region: Secondary | ICD-10-CM

## 2016-04-09 DIAGNOSIS — M461 Sacroiliitis, not elsewhere classified: Secondary | ICD-10-CM | POA: Diagnosis not present

## 2016-04-09 DIAGNOSIS — M545 Low back pain: Secondary | ICD-10-CM | POA: Diagnosis present

## 2016-04-09 DIAGNOSIS — M791 Myalgia: Secondary | ICD-10-CM | POA: Diagnosis not present

## 2016-04-09 DIAGNOSIS — M961 Postlaminectomy syndrome, not elsewhere classified: Secondary | ICD-10-CM

## 2016-04-09 MED ORDER — OXYCODONE HCL 10 MG PO TABS: ORAL_TABLET | ORAL | Status: DC

## 2016-04-09 MED ORDER — FENTANYL 75 MCG/HR TD PT72: MEDICATED_PATCH | TRANSDERMAL | Status: DC

## 2016-04-09 NOTE — Progress Notes (Signed)
Safety precautions to be maintained throughout the outpatient stay will include: orient to surroundings, keep bed in low position, maintain call bell within reach at all times, provide assistance with transfer out of bed and ambulation.  

## 2016-04-09 NOTE — Patient Instructions (Addendum)
PLAN   Continue present medication oxycodone and fentanyl patches.   F/U PCP Dr.Sowles  for evaluation of  BP diabetes mellitus and general medical  condition  F/U surgical evaluation. Surgical evaluation discussed and we will proceed with surgical evaluation once patient wishes to do such  F/U neurological evaluation. May consider [facility] EMG studies and other studies  May consider radiofrequency rhizolysis or intraspinal procedures pending response to present treatment and F/U evaluation   Patient to call Pain Management Center should patient have concerns prior to scheduled return appointmentPain Management Discharge Instructions  General Discharge Instructions :  If you need to reach your doctor call: Monday-Friday 8:00 am - 4:00 pm at [phone] or toll free [phone].  After clinic hours [phone] to have operator reach doctor.  Bring all of your medication bottles to all your appointments in the pain clinic.  To cancel or reschedule your appointment with Pain Management please remember to call 24 hours in advance to avoid a fee.  Refer to the educational materials which you have been given on: General Risks, I had my Procedure. Discharge Instructions, Post Sedation.  Post Procedure Instructions:  The drugs you were given will stay in your system until tomorrow, so for the next 24 hours you should not drive, make any legal decisions or drink any alcoholic beverages.  You may eat anything you prefer, but it is better to start with liquids then soups and crackers, and gradually work up to solid foods.  Please notify your doctor immediately if you have any unusual bleeding, trouble breathing or pain that is not related to your normal pain.  Depending on the type of procedure that was done, some parts of your body may feel week and/or numb.  This usually clears up by tonight or the next day.  Walk with the use of an assistive device or accompanied by an adult for the 24  hours.  You may use ice on the affected area for the first 24 hours.  Put ice in a Ziploc bag and cover with a towel and place against area 15 minutes on 15 minutes off.  You may switch to heat after 24 hours.

## 2016-04-09 NOTE — Progress Notes (Signed)
   Subjective:    Patient ID: Lorraine Jones, female    DOB: [birth year], 65 y.o.   MRN: [id number]  HPI  The patient is a 65 year old female who returns to pain management for further evaluation and treatment of pain involving the lower back lower extremity region especially on the right the patient has had significant improvement of her pain with block of the articular branches of the femoral and obturator nerves and block of nerves to sacroiliac joint. At the present time patient continues to do well and we will avoid additional interventional treatment. We will continue oxycodone and fentanyl patches. The patient denies trauma change in events of daily living to cause change in symptomatology and will call pain management should there be a change in her condition. All agreed with plan  Review of Systems     Objective:   Physical Exam  There was tenderness of the splenius capitis and occipitalis region palpation which reproduces minimal discomfort. There was minimal tenderness of the acromioclavicular and glenohumeral joint regions. Palpation over the cervical and thoracic facet regions were with tenderness to palpation of mild degree and patient was at unremarkable Spurling's maneuver. The patient was with bilaterally equal grip strength and without increased pain with Tinel and Phalen's maneuver. Palpation of the thoracic region was without evidence of crepitus. Palpation over the lumbar region was with tenderness to palpation of moderate degree right greater than left with lateral bending rotation extension and palpation over the lumbar facets reproducing moderately severe discomfort. Palpation of the PSIS and PII S region reproduced moderate discomfort. There was moderate tenderness of the gluteal and piriformis musculature region with decreased straight leg raising tolerates approximately 20 without a definite increase of pain with dorsiflexion noted there was negative clonus negative Homans.  There was questionably decreased sensation of the lower extremity and an L5 dermatomal distribution. Abdomen was nontender with no costovertebral tenderness      Assessment & Plan:    Degenerative disc disease lumbar spine Status post prior surgical intervention lumbar region with L5-S1 scar formation, facet hypertrophy, and diffuse degenerative changes noted throughout the lumbar spine  Lumbar facet syndrome  Lumbar radiculopathy  Sacroiliac joint dysfunction  DJD (hip)      PLAN   Continue present medication oxycodone and fentanyl patches.   F/U PCP Dr.Sowles  for evaluation of  BP diabetes mellitus and general medical  condition  F/U surgical evaluation. Surgical evaluation discussed and we will proceed with surgical evaluation once patient wishes to do such  F/U neurological evaluation. May consider [facility] EMG studies and other studies  May consider radiofrequency rhizolysis or intraspinal procedures pending response to present treatment and F/U evaluation   Patient to call Pain Management Center should patient have concerns prior to scheduled return appointment

## 2016-04-18 ENCOUNTER — Ambulatory Visit
Admission: RE | Admit: 2016-04-18 | Discharge: 2016-04-18 | Disposition: A | Discharge status: Home / Self Care | Payer: Medicare Other | Source: Ambulatory Visit | Attending: Family Medicine | Admitting: Family Medicine

## 2016-04-18 ENCOUNTER — Other Ambulatory Visit: Payer: Self-pay | Admitting: Family Medicine

## 2016-04-18 DIAGNOSIS — Z1239 Encounter for other screening for malignant neoplasm of breast: Secondary | ICD-10-CM

## 2016-04-18 DIAGNOSIS — Z13820 Encounter for screening for osteoporosis: Secondary | ICD-10-CM | POA: Diagnosis not present

## 2016-04-18 DIAGNOSIS — Z780 Asymptomatic menopausal state: Secondary | ICD-10-CM | POA: Diagnosis not present

## 2016-04-18 DIAGNOSIS — E2839 Other primary ovarian failure: Secondary | ICD-10-CM

## 2016-04-18 DIAGNOSIS — M8588 Other specified disorders of bone density and structure, other site: Secondary | ICD-10-CM | POA: Diagnosis not present

## 2016-04-18 DIAGNOSIS — M8580 Other specified disorders of bone density and structure, unspecified site: Secondary | ICD-10-CM | POA: Insufficient documentation

## 2016-04-18 DIAGNOSIS — Z1231 Encounter for screening mammogram for malignant neoplasm of breast: Secondary | ICD-10-CM | POA: Insufficient documentation

## 2016-04-29 ENCOUNTER — Other Ambulatory Visit: Payer: Self-pay | Admitting: Family Medicine

## 2016-05-09 ENCOUNTER — Ambulatory Visit: Payer: Medicare Other | Admitting: Pain Medicine

## 2016-05-09 ENCOUNTER — Telehealth: Payer: Self-pay | Admitting: Pain Medicine

## 2016-05-09 NOTE — Telephone Encounter (Signed)
Was on the way to appt and has emergency, resched to 05-13-16 at 1:15

## 2016-05-09 NOTE — Telephone Encounter (Signed)
Thank you :)

## 2016-05-13 ENCOUNTER — Encounter: Payer: Self-pay | Admitting: Pain Medicine

## 2016-05-13 ENCOUNTER — Ambulatory Visit: Payer: Medicare Other | Attending: Pain Medicine | Admitting: Pain Medicine

## 2016-05-13 ENCOUNTER — Other Ambulatory Visit: Payer: Self-pay | Admitting: Family Medicine

## 2016-05-13 VITALS — BP 152/69 | HR 88 | Temp 98.1°F | Resp 18 | Ht 60.0 in | Wt 193.0 lb

## 2016-05-13 DIAGNOSIS — Z9889 Other specified postprocedural states: Secondary | ICD-10-CM | POA: Diagnosis not present

## 2016-05-13 DIAGNOSIS — M8580 Other specified disorders of bone density and structure, unspecified site: Secondary | ICD-10-CM

## 2016-05-13 DIAGNOSIS — M461 Sacroiliitis, not elsewhere classified: Secondary | ICD-10-CM | POA: Diagnosis not present

## 2016-05-13 DIAGNOSIS — M5136 Other intervertebral disc degeneration, lumbar region: Secondary | ICD-10-CM

## 2016-05-13 DIAGNOSIS — M5116 Intervertebral disc disorders with radiculopathy, lumbar region: Secondary | ICD-10-CM | POA: Insufficient documentation

## 2016-05-13 DIAGNOSIS — M19011 Primary osteoarthritis, right shoulder: Secondary | ICD-10-CM | POA: Diagnosis not present

## 2016-05-13 DIAGNOSIS — M48062 Spinal stenosis, lumbar region with neurogenic claudication: Secondary | ICD-10-CM

## 2016-05-13 DIAGNOSIS — M1610 Unilateral primary osteoarthritis, unspecified hip: Secondary | ICD-10-CM | POA: Insufficient documentation

## 2016-05-13 DIAGNOSIS — M47817 Spondylosis without myelopathy or radiculopathy, lumbosacral region: Secondary | ICD-10-CM | POA: Diagnosis not present

## 2016-05-13 DIAGNOSIS — M5481 Occipital neuralgia: Secondary | ICD-10-CM

## 2016-05-13 DIAGNOSIS — M79606 Pain in leg, unspecified: Secondary | ICD-10-CM | POA: Diagnosis present

## 2016-05-13 DIAGNOSIS — I10 Essential (primary) hypertension: Secondary | ICD-10-CM

## 2016-05-13 DIAGNOSIS — M160 Bilateral primary osteoarthritis of hip: Secondary | ICD-10-CM

## 2016-05-13 DIAGNOSIS — M533 Sacrococcygeal disorders, not elsewhere classified: Secondary | ICD-10-CM | POA: Insufficient documentation

## 2016-05-13 DIAGNOSIS — M5416 Radiculopathy, lumbar region: Secondary | ICD-10-CM | POA: Diagnosis not present

## 2016-05-13 DIAGNOSIS — M961 Postlaminectomy syndrome, not elsewhere classified: Secondary | ICD-10-CM

## 2016-05-13 DIAGNOSIS — E1340 Other specified diabetes mellitus with diabetic neuropathy, unspecified: Secondary | ICD-10-CM

## 2016-05-13 DIAGNOSIS — M791 Myalgia: Secondary | ICD-10-CM | POA: Diagnosis not present

## 2016-05-13 DIAGNOSIS — M545 Low back pain: Secondary | ICD-10-CM | POA: Diagnosis present

## 2016-05-13 DIAGNOSIS — M5030 Other cervical disc degeneration, unspecified cervical region: Secondary | ICD-10-CM

## 2016-05-13 MED ORDER — OXYCODONE HCL 10 MG PO TABS: ORAL_TABLET | ORAL | 0 refills | Status: DC

## 2016-05-13 MED ORDER — FENTANYL 75 MCG/HR TD PT72: MEDICATED_PATCH | TRANSDERMAL | 0 refills | Status: DC

## 2016-05-13 NOTE — Progress Notes (Signed)
   The patient is a 65 year old female who returns to pain management for further evaluation and treatment of pain involving the lower back lower extremity region especially on the right. The patient states the pain is fairly well-controlled at this time. The patient is status post block of the articular branches of the femoral and obturator nerves. The patient continues medications consisting of oxycodone and Duragesic patches . The patient is with relief of pain at this time and is able to perform most activities of daily living as well as obtaining restful sleep without significant pain The patient did admit to significant pain of the right shoulder The patient denied any specific trauma. We discussed patient's condition including MRI as well as interventional treatment and surgical evaluation. The patient will call should her symptoms persist or increase. We will consider additional treatment as discussed at that time. The patient continues oxycodone and fentanyl patches with fairly good relief of pain with the most bothersome pain at the present time being pain of the right shoulder. The patient is to call pain management which she wishes to proceed with interventional treatment or wishes to undergo surgical evaluation or MRI as discussed. All agreed to suggested treatment plan.     Physical examination   There was tenderness to palpation of the splenius capitis and occipitalis region a mild degree with mild tenderness of the cervical and thoracic facet region. Palpation of the acromioclavicular and glenohumeral joint region reproduced severe pain on the right compared to the left with severely limited range of motion of the right shoulder compared to the left shoulder with questionably decreased grip strength on the right compared to the left. Tinel and Phalen's maneuver were without exacerbation of significant pain. The patient appeared to be with unremarkable Spurling's maneuver and was unable to  perform drop test due to pain of the right shoulder. Of the lumbar region was attends to palpation of mild to moderate degree with lateral bending rotation extension and palpation of the lumbar facets reproducing mild to moderate discomfort. There was moderate tenderness of the greater trochanteric region iliotibial band region with straight leg raising tolerates approximately 20 without increased pain with dorsiflexion noted. There was questionably decreased sensation of the L5 dermatomal distribution with negative clonus negative Homans. Abdomen nontender with no costovertebral tenderness noted     Assessment     Degenerative disc disease lumbar spine Status post prior surgical intervention lumbar region with L5-S1 scar formation, facet hypertrophy, and diffuse degenerative changes noted throughout the lumbar spine  Lumbar facet syndrome  Lumbar radiculopathy  Sacroiliac joint dysfunction  DJD (hip)  DJD  (right shoulder)      PLAN   Continue present medications oxycodone and fentanyl patches.   F/U PCP Dr.Sowles  for evaluation of  BP diabetes mellitus and general medical  condition  F/U surgical evaluation. Surgical evaluation discussed and we will proceed with surgical evaluation once patient wishes to do such.   May consider surgical evaluation of right shoulder as well as MRI as discussed  F/U neurological evaluation. May consider PNCV EMG studies and other studies  May consider radiofrequency rhizolysis or intraspinal procedures pending response to present treatment and F/U evaluation   Patient to call Pain Management Center should patient have concerns prior to scheduled return appointment

## 2016-05-13 NOTE — Progress Notes (Signed)
Safety precautions to be maintained throughout the outpatient stay will include: orient to surroundings, keep bed in low position, maintain call bell within reach at all times, provide assistance with transfer out of bed and ambulation.  

## 2016-05-13 NOTE — Telephone Encounter (Signed)
Spoke with patient and she states she takes Carvedilol 6.25 1 tablet bid.

## 2016-05-13 NOTE — Telephone Encounter (Signed)
Please verify dose that she is currently taking. Our records has as 3.125, but we will fill whatever dose she has been taking

## 2016-05-13 NOTE — Patient Instructions (Addendum)
PLAN   Continue present medications oxycodone and fentanyl patches.   F/U PCP Dr.Sowles  for evaluation of  BP diabetes mellitus and general medical  condition  F/U surgical evaluation. Surgical evaluation discussed and we will proceed with surgical evaluation once patient wishes to do such. May consider surgical evaluation of right shoulder as well as MRI as discussed  F/U neurological evaluation. May consider PNCV EMG studies and other studies  May consider radiofrequency rhizolysis or intraspinal procedures pending response to present treatment and F/U evaluation   Patient to call Pain Management Center should patient have concerns prior to scheduled return appointment

## 2016-05-29 ENCOUNTER — Telehealth: Payer: Self-pay | Admitting: *Deleted

## 2016-06-10 ENCOUNTER — Encounter: Payer: Self-pay | Admitting: Pain Medicine

## 2016-06-10 ENCOUNTER — Ambulatory Visit: Payer: Medicare Other | Attending: Pain Medicine | Admitting: Pain Medicine

## 2016-06-10 VITALS — BP 157/76 | HR 80 | Temp 98.0°F | Resp 16 | Ht 60.5 in | Wt 193.0 lb

## 2016-06-10 DIAGNOSIS — M48062 Spinal stenosis, lumbar region with neurogenic claudication: Secondary | ICD-10-CM

## 2016-06-10 DIAGNOSIS — M47896 Other spondylosis, lumbar region: Secondary | ICD-10-CM | POA: Insufficient documentation

## 2016-06-10 DIAGNOSIS — M47817 Spondylosis without myelopathy or radiculopathy, lumbosacral region: Secondary | ICD-10-CM | POA: Diagnosis not present

## 2016-06-10 DIAGNOSIS — M5481 Occipital neuralgia: Secondary | ICD-10-CM

## 2016-06-10 DIAGNOSIS — E1340 Other specified diabetes mellitus with diabetic neuropathy, unspecified: Secondary | ICD-10-CM

## 2016-06-10 DIAGNOSIS — M19011 Primary osteoarthritis, right shoulder: Secondary | ICD-10-CM | POA: Insufficient documentation

## 2016-06-10 DIAGNOSIS — M5030 Other cervical disc degeneration, unspecified cervical region: Secondary | ICD-10-CM

## 2016-06-10 DIAGNOSIS — M545 Low back pain: Secondary | ICD-10-CM | POA: Diagnosis present

## 2016-06-10 DIAGNOSIS — M533 Sacrococcygeal disorders, not elsewhere classified: Secondary | ICD-10-CM

## 2016-06-10 DIAGNOSIS — M461 Sacroiliitis, not elsewhere classified: Secondary | ICD-10-CM | POA: Diagnosis not present

## 2016-06-10 DIAGNOSIS — M5116 Intervertebral disc disorders with radiculopathy, lumbar region: Secondary | ICD-10-CM | POA: Insufficient documentation

## 2016-06-10 DIAGNOSIS — Z9889 Other specified postprocedural states: Secondary | ICD-10-CM | POA: Diagnosis not present

## 2016-06-10 DIAGNOSIS — M1610 Unilateral primary osteoarthritis, unspecified hip: Secondary | ICD-10-CM | POA: Insufficient documentation

## 2016-06-10 DIAGNOSIS — M160 Bilateral primary osteoarthritis of hip: Secondary | ICD-10-CM

## 2016-06-10 DIAGNOSIS — M791 Myalgia: Secondary | ICD-10-CM | POA: Diagnosis not present

## 2016-06-10 DIAGNOSIS — M5416 Radiculopathy, lumbar region: Secondary | ICD-10-CM | POA: Diagnosis not present

## 2016-06-10 DIAGNOSIS — M5136 Other intervertebral disc degeneration, lumbar region: Secondary | ICD-10-CM

## 2016-06-10 MED ORDER — FENTANYL 75 MCG/HR TD PT72: MEDICATED_PATCH | TRANSDERMAL | 0 refills | Status: AC

## 2016-06-10 MED ORDER — OXYCODONE HCL 10 MG PO TABS: ORAL_TABLET | ORAL | 0 refills | Status: DC

## 2016-06-10 MED ORDER — FENTANYL 75 MCG/HR TD PT72: MEDICATED_PATCH | TRANSDERMAL | 0 refills | Status: DC

## 2016-06-10 NOTE — Patient Instructions (Signed)
PLAN   Continue present medications oxycodone and fentanyl patches.   F/U PCP Dr.Sowles  for evaluation of  BP diabetes mellitus and general medical  condition  F/U surgical evaluation. Surgical evaluation discussed and we will proceed with surgical evaluation once patient wishes to do such. May consider surgical evaluation of right shoulder as well as MRI as discussed  F/U neurological evaluation. May consider PNCV EMG studies and other studies  May consider radiofrequency rhizolysis or intraspinal procedures pending response to present treatment and F/U evaluation   Patient to call Pain Management Center should patient have concerns prior to scheduled return appointment

## 2016-06-10 NOTE — Progress Notes (Signed)
Patient here for medication management Safety precautions to be maintained throughout the outpatient stay will include: orient to surroundings, keep bed in low position, maintain call bell within reach at all times, provide assistance with transfer out of bed and ambulation.  

## 2016-06-11 ENCOUNTER — Encounter: Payer: Medicare Other | Admitting: Pain Medicine

## 2016-06-11 NOTE — Progress Notes (Signed)
    The patient is a 65 year old female who returns to pain management for further evaluation and treatment of pain involving the lower back lower extremity region especially the region of the right lower extremity region. The patient has had significant improvement of her pain with prior treatment performed in pain management Center and continues to be with pain fairly well-controlled at this time. The patient is with pain which she states is increased with standing walking with pain occurring in the region of the hip and buttocks on the right compared to the left. The patient is with prior surgical intervention of the lumbar region without plans for additional surgical intervention. We will continue oxycodone and fentanyl patch at this time and we will remain available to consider modification of treatment regimen including interventional treatment pending follow-up evaluation. All agreed to suggested treatment plan.    Physical examination  There was tenderness of the paraspinal musculature region the cervical and thoracic regions with no crepitus of the thoracic region. The patient appeared to be with bilaterally equal grip strength with Tinel and Phalen's maneuver reproducing minimal discomfort. There was unremarkable Spurling's maneuver and patient was able to perform drop test with mild difficulty. There was tenderness over the thoracic region without crepitus of the thoracic region with palpation over the lumbar paraspinal muscles lumbar facet region reproducing moderate discomfort right greater than left. There was tenderness of the PSIS and PII S regions right greater than the left. Straight leg raising was tolerates approximately 20 with questionably increased pain with dorsiflexion noted. DTRs were difficult to elicit. Patient had difficulty relaxing. without definite sensory deficit or dermatomal distribution detected. There was negative clonus negative Homans. Abdomen was nontender with no  costovertebral angle tenderness noted.      Assessment   Degenerative disc disease lumbar spine Status post prior surgical intervention lumbar region with L5-S1 scar formation, facet hypertrophy, and diffuse degenerative changes noted throughout the lumbar spine  Lumbar facet syndrome  Lumbar radiculopathy  Sacroiliac joint dysfunction  DJD (hip)  DJD  (right shoulder)       PLAN   Continue present medications oxycodone and fentanyl patches.   F/U PCP Dr.Sowles  for evaluation of  BP diabetes mellitus and general medical  condition  F/U surgical evaluation. Surgical evaluation discussed and we will proceed with surgical evaluation once patient wishes to do such. May consider surgical evaluation of right shoulder as well as MRI as discussed  F/U neurological evaluation. May consider PNCV EMG studies and other studies  May consider radiofrequency rhizolysis or intraspinal procedures pending response to present treatment and F/U evaluation   Patient to call Pain Management Center should patient have concerns prior to scheduled return appointment

## 2016-06-25 ENCOUNTER — Ambulatory Visit (INDEPENDENT_AMBULATORY_CARE_PROVIDER_SITE_OTHER): Payer: Medicare Other | Admitting: Family Medicine

## 2016-06-25 ENCOUNTER — Encounter: Payer: Self-pay | Admitting: Family Medicine

## 2016-06-25 VITALS — BP 142/70 | HR 96 | Temp 98.0°F | Wt 188.2 lb

## 2016-06-25 DIAGNOSIS — Z23 Encounter for immunization: Secondary | ICD-10-CM

## 2016-06-25 DIAGNOSIS — E1165 Type 2 diabetes mellitus with hyperglycemia: Secondary | ICD-10-CM | POA: Diagnosis not present

## 2016-06-25 DIAGNOSIS — F339 Major depressive disorder, recurrent, unspecified: Secondary | ICD-10-CM | POA: Insufficient documentation

## 2016-06-25 DIAGNOSIS — E1143 Type 2 diabetes mellitus with diabetic autonomic (poly)neuropathy: Secondary | ICD-10-CM

## 2016-06-25 DIAGNOSIS — F330 Major depressive disorder, recurrent, mild: Secondary | ICD-10-CM | POA: Diagnosis not present

## 2016-06-25 DIAGNOSIS — G8929 Other chronic pain: Secondary | ICD-10-CM | POA: Diagnosis not present

## 2016-06-25 DIAGNOSIS — I10 Essential (primary) hypertension: Secondary | ICD-10-CM | POA: Diagnosis not present

## 2016-06-25 DIAGNOSIS — E785 Hyperlipidemia, unspecified: Secondary | ICD-10-CM | POA: Diagnosis not present

## 2016-06-25 DIAGNOSIS — IMO0002 Reserved for concepts with insufficient information to code with codable children: Secondary | ICD-10-CM

## 2016-06-25 MED ORDER — VENLAFAXINE HCL ER 75 MG PO CP24: ORAL_CAPSULE | ORAL | 1 refills | Status: DC

## 2016-06-25 MED ORDER — OLMESARTAN-AMLODIPINE-HCTZ 40-10-25 MG PO TABS: 1.0000 | ORAL_TABLET | Freq: Every day | ORAL | 1 refills | Status: DC

## 2016-06-25 NOTE — Progress Notes (Signed)
Name: Lorraine Jones   MRN: [id number]    DOB: 1951/01/27   Date:06/25/2016       Progress Note  Subjective  Chief Complaint  Chief Complaint  Patient presents with  . Follow-up    HPI    DMII with neuropathy, and gastroparesis: she is now on  Metformin and Novolog 24 units before meals and Lantus 50 units daily she was on Trulicity but had to stop secondary to cost of medication. Seeing Dr. Filbert Berthold and has a follow up in October. She states she has been compliant with her diet checking glucose more often, 3-4 times daily and glucose has been around 140-160's She denies polydipsia or polyphagia, but she has intermittent episodes of polyuria. She is still getting steroid injections from the pain clinic, and states when she is in a lot of pain her glucose gets even higher. She is taking statins and aspirin daily also on ARB. She states her neuropathy is under control at this time, and indigestion is under control with Reglan before meals. No longer takes PPI.  HTN: she needs refill of Tribenzor and carvedilol, she is compliant with medication and bp is at goal. No chest pain or palpitation. She has mild lower extremity edema, but better than before.   Chronic Pain: she sees Dr. Primus Bravo at least once a month, taking pain medication as prescribed, she has constipation secondary to narcotics and is taking Miralax to control symptoms, she does not want to switch to Linzess or Amitiza at this time because of cost.   Hyperlipidemia: taking Pravastatin and denies side effects, discussed switching to Atorvastatin but she is afraid of changing. She denies myalgias secondary to medication. No chest pain   Osteopenia: she had a bone density back in 03/2016 , her FRAX score is 3.2 % for major osteoporotic fracture in the next 10 years and 0.4 for hip .   Depression Mild: she has a long of major depression, part of it secondary to living in pain and inability to do things she likes. She states Effexor  is helping, and also her daughter that keeps her motivated - she picks up to go out. She still has episodes of crying spells, no anhedonia, she denies suicidal thoughts or ideation  Patient Active Problem List   Diagnosis Date Noted  . Major depression, recurrent ([place]) 06/25/2016  . Osteopenia 04/18/2016  . Gastroesophageal reflux disease without esophagitis 05/12/2015  . Chronic pain 05/12/2015  . Hyperlipidemia 05/12/2015  . DDD (degenerative disc disease), lumbar 02/27/2015  . Lumbar post-laminectomy syndrome 02/27/2015  . Neuropathy due to secondary diabetes ([place]) 02/27/2015  . Spinal stenosis, lumbar region, with neurogenic claudication 02/27/2015  . Sacroiliac joint dysfunction of both sides 02/27/2015  . Degenerative joint disease (DJD) of hip 02/27/2015  . DDD (degenerative disc disease), cervical 02/27/2015  . Bilateral occipital neuralgia 02/27/2015  . Chronic constipation 06/02/2014  . Hyponatremia 06/01/2014  . Hypertension, benign 06/01/2014    Past Surgical History:  Procedure Laterality Date  . CHOLECYSTECTOMY  1996  . CYSTOSCOPY WITH URETEROSCOPY Right 09/01/2013   Procedure: CYSTOSCOPY WITH UNROOFING  RIGHT URETEROCELE AND URETERAL STONE REMOVED  WITH GYRUS COLLINS KNIFE. ;  Surgeon: Irine Seal, MD;  Location: [facility];  Service: Urology;  Laterality: Right;  cysto, right uretersoscopy and stone extraction   collins knife  . LUMBAR DISC SURGERY  1993   L4  --  L5    Family History  Problem Relation Age of Onset  . Diabetes  Mother   . Heart disease Mother   . Hypertension Mother     Social History   Social History  . Marital status: Married    Spouse name: N/A  . Number of children: N/A  . Years of education: N/A   Occupational History  . Not on file.   Social History Main Topics  . Smoking status: Current Every Day Smoker    Packs/day: 0.50    Years: 12.00    Types: Cigarettes  . Smokeless tobacco: Never Used  . Alcohol use No   . Drug use: No  . Sexual activity: Yes   Other Topics Concern  . Not on file   Social History Narrative  . No narrative on file     Current Outpatient Prescriptions:  .  aspirin EC 81 MG tablet, Take 81 mg by mouth daily., Disp: , Rfl:  .  carvedilol (COREG) 6.25 MG tablet, TAKE ONE TABLET BY MOUTH TWICE DAILY, Disp: 180 tablet, Rfl: 1 .  dorzolamide-timolol (COSOPT) 22.3-6.8 MG/ML ophthalmic solution, Place 1 drop into both eyes 2 (two) times daily., Disp: , Rfl:  .  fentaNYL (DURAGESIC - DOSED MCG/HR) 75 MCG/HR, Apply 2 patches to skin every 2 days if tolerated.   NOTE: Do not apply 100 g per hour fentanyl patch since insurance will not approve 100 g patch for you, Disp: 30 patch, Rfl: 0 .  fentaNYL (DURAGESIC - DOSED MCG/HR) 75 MCG/HR, Apply 2 patches to skin every 2 days if tolerated.   NOTE: Do not apply 100 g per hour fentanyl patch since insurance will not approve 100 g patch for you, Disp: 30 patch, Rfl: 0 .  insulin aspart (NOVOLOG) 100 UNIT/ML FlexPen, Inject 24 Units into the skin. , Disp: , Rfl:  .  Insulin Pen Needle (PEN NEEDLES 31GX5/16") 31G X 8 MM MISC, , Disp: , Rfl:  .  LANTUS SOLOSTAR 100 UNIT/ML Solostar Pen, INJECT 50 UNITS INTO THE SKIN DAILY AT 10 PM., Disp: 15 pen, Rfl: 5 .  metFORMIN (GLUCOPHAGE) 1000 MG tablet, Take 1 tablet (1,000 mg total) by mouth 2 (two) times daily with a meal., Disp: 180 tablet, Rfl: 1 .  metoCLOPramide (REGLAN) 5 MG tablet, Take 1 tablet (5 mg total) by mouth 3 (three) times daily., Disp: 270 tablet, Rfl: 1 .  Multiple Vitamins-Minerals (MULTIVITAMIN WITH MINERALS) tablet, Take 1 tablet by mouth daily., Disp: , Rfl:  .  Olmesartan-Amlodipine-HCTZ (TRIBENZOR) 40-10-25 MG TABS, Take 1 tablet by mouth daily., Disp: 90 tablet, Rfl: 1 .  Oxycodone HCl 10 MG TABS, Limit 1 tab by mouth 4-8 times per day for breakthrough pain if tolerated while wearing fentanyl patches, Disp: 200 tablet, Rfl: 0 .  polyethylene glycol (MIRALAX / GLYCOLAX)  packet, Take 17 g by mouth daily., Disp: 14 each, Rfl: 0 .  pravastatin (PRAVACHOL) 40 MG tablet, Take 1 tablet (40 mg total) by mouth daily., Disp: 90 tablet, Rfl: 1 .  venlafaxine XR (EFFEXOR-XR) 75 MG 24 hr capsule, TAKE ONE CAPSULE BY MOUTH ONCE DAILY FOR  DEPRESSION, Disp: 90 capsule, Rfl: 1  Allergies  Allergen Reactions  . Diclofenac Sodium Swelling  . Lodine [Etodolac] Swelling  . Naproxen Sodium Swelling  . Neurontin [Gabapentin] Swelling     ROS  Constitutional: Negative for fever or weight change.  Respiratory: Negative for cough and shortness of breath.   Cardiovascular: Negative for chest pain or palpitations.  Gastrointestinal: Negative for abdominal pain, no bowel changes.  Musculoskeletal: Positive for gait problem or joint  swelling.  Skin: Negative for rash.   Neurological: Negative for dizziness or headache.  No other specific complaints in a complete review of systems (except as listed in HPI above).  Objective  Vitals:   06/25/16 0854  BP: (!) 142/70  Pulse: 96  Temp: 98 F (36.7 C)  SpO2: 94%  Weight: 188 lb 3.2 oz (85.4 kg)    Body mass index is 36.15 kg/m.  Physical Exam  Constitutional: Patient appears well-developed and well-nourished. Obese No distress.  HEENT: head atraumatic, normocephalic, pupils equal and reactive to light, neck supple, throat within normal limits Cardiovascular: Normal rate, regular rhythm and normal heart sounds.  No murmur heard. No BLE edema. Pulmonary/Chest: Effort normal and breath sounds normal. No respiratory distress. Abdominal: Soft.  There is no tenderness. Psychiatric: Patient has a normal mood and affect. behavior is normal. Judgment and thought content normal.  PHQ2/9: Depression screen [facility] 2/9 06/25/2016 06/10/2016 04/09/2016 03/04/2016 02/08/2016  Decreased Interest 0 0 0 0 0  Down, Depressed, Hopeless 0 0 0 0 0  PHQ - 2 Score 0 0 0 0 0  Altered sleeping - - - - -  Tired, decreased energy - - - - -  Change  in appetite - - - - -  Feeling bad or failure about yourself  - - - - -  Trouble concentrating - - - - -  Moving slowly or fidgety/restless - - - - -  Suicidal thoughts - - - - -  PHQ-9 Score - - - - -  Difficult doing work/chores - - - - -     Fall Risk: Fall Risk  06/25/2016 06/10/2016 04/09/2016 03/06/2016 03/04/2016  Falls in the past year? No No No No No  Risk for fall due to : - - - - -      Functional Status Survey: Is the patient deaf or have difficulty hearing?: No Does the patient have difficulty seeing, even when wearing glasses/contacts?: Yes (glasses) Does the patient have difficulty concentrating, remembering, or making decisions?: No Does the patient have difficulty walking or climbing stairs?: Yes Does the patient have difficulty dressing or bathing?: No Does the patient have difficulty doing errands alone such as visiting a doctor's office or shopping?: Yes    Assessment & Plan  1. Hypertension, benign  - Olmesartan-Amlodipine-HCTZ (TRIBENZOR) 40-10-25 MG TABS; Take 1 tablet by mouth daily.  Dispense: 90 tablet; Refill: 1  2. Uncontrolled type 2 diabetes with peripheral autonomic neuropathy ([place])  Keep follow up with Dr. Filbert Berthold  3. Hyperlipidemia  On Pravastatin and does not want to switch to Atorvastatin   4. Chronic pain  Sees Dr. Primus Bravo  5. Poorly controlled type 2 diabetes mellitus with gastroparesis ([facility])  Gastroparesis is good with Reglan  6. Needs flu shot  - Flu vaccine HIGH DOSE PF  7. Mild episode of recurrent major depressive disorder ([facility])  - venlafaxine XR (EFFEXOR-XR) 75 MG 24 hr capsule; TAKE ONE CAPSULE BY MOUTH ONCE DAILY FOR  DEPRESSION  Dispense: 90 capsule; Refill: 1

## 2016-07-01 DIAGNOSIS — I10 Essential (primary) hypertension: Secondary | ICD-10-CM | POA: Diagnosis not present

## 2016-07-01 DIAGNOSIS — H40023 Open angle with borderline findings, high risk, bilateral: Secondary | ICD-10-CM | POA: Diagnosis not present

## 2016-07-09 ENCOUNTER — Other Ambulatory Visit: Payer: Self-pay | Admitting: Pain Medicine

## 2016-07-09 DIAGNOSIS — M791 Myalgia: Secondary | ICD-10-CM | POA: Diagnosis not present

## 2016-07-09 DIAGNOSIS — M461 Sacroiliitis, not elsewhere classified: Secondary | ICD-10-CM | POA: Diagnosis not present

## 2016-07-09 DIAGNOSIS — M47817 Spondylosis without myelopathy or radiculopathy, lumbosacral region: Secondary | ICD-10-CM | POA: Diagnosis not present

## 2016-07-09 DIAGNOSIS — M5416 Radiculopathy, lumbar region: Secondary | ICD-10-CM | POA: Diagnosis not present

## 2016-08-01 DIAGNOSIS — Z79899 Other long term (current) drug therapy: Secondary | ICD-10-CM | POA: Diagnosis not present

## 2016-08-01 DIAGNOSIS — E1165 Type 2 diabetes mellitus with hyperglycemia: Secondary | ICD-10-CM | POA: Diagnosis not present

## 2016-08-01 DIAGNOSIS — Z794 Long term (current) use of insulin: Secondary | ICD-10-CM | POA: Diagnosis not present

## 2016-08-04 ENCOUNTER — Other Ambulatory Visit: Payer: Self-pay | Admitting: Pain Medicine

## 2016-08-05 ENCOUNTER — Other Ambulatory Visit: Payer: Self-pay | Admitting: Family Medicine

## 2016-08-05 DIAGNOSIS — E785 Hyperlipidemia, unspecified: Secondary | ICD-10-CM

## 2016-08-20 DIAGNOSIS — M5416 Radiculopathy, lumbar region: Secondary | ICD-10-CM | POA: Diagnosis not present

## 2016-08-20 DIAGNOSIS — M47817 Spondylosis without myelopathy or radiculopathy, lumbosacral region: Secondary | ICD-10-CM | POA: Diagnosis not present

## 2016-08-20 DIAGNOSIS — M791 Myalgia: Secondary | ICD-10-CM | POA: Diagnosis not present

## 2016-08-20 DIAGNOSIS — M461 Sacroiliitis, not elsewhere classified: Secondary | ICD-10-CM | POA: Diagnosis not present

## 2016-10-08 ENCOUNTER — Other Ambulatory Visit: Payer: Self-pay | Admitting: Pain Medicine

## 2016-10-08 DIAGNOSIS — M791 Myalgia: Secondary | ICD-10-CM | POA: Diagnosis not present

## 2016-10-08 DIAGNOSIS — M461 Sacroiliitis, not elsewhere classified: Secondary | ICD-10-CM | POA: Diagnosis not present

## 2016-10-08 DIAGNOSIS — M5416 Radiculopathy, lumbar region: Secondary | ICD-10-CM | POA: Diagnosis not present

## 2016-10-08 DIAGNOSIS — M47817 Spondylosis without myelopathy or radiculopathy, lumbosacral region: Secondary | ICD-10-CM | POA: Diagnosis not present

## 2016-10-25 ENCOUNTER — Other Ambulatory Visit: Payer: Self-pay | Admitting: Family Medicine

## 2016-10-25 NOTE — Telephone Encounter (Signed)
Patient requesting refill of Lantus to [facility].

## 2016-10-29 DIAGNOSIS — E1165 Type 2 diabetes mellitus with hyperglycemia: Secondary | ICD-10-CM | POA: Diagnosis not present

## 2016-10-29 DIAGNOSIS — Z79899 Other long term (current) drug therapy: Secondary | ICD-10-CM | POA: Diagnosis not present

## 2016-10-29 DIAGNOSIS — Z794 Long term (current) use of insulin: Secondary | ICD-10-CM | POA: Diagnosis not present

## 2016-11-05 DIAGNOSIS — M5416 Radiculopathy, lumbar region: Secondary | ICD-10-CM | POA: Diagnosis not present

## 2016-11-05 DIAGNOSIS — M461 Sacroiliitis, not elsewhere classified: Secondary | ICD-10-CM | POA: Diagnosis not present

## 2016-11-05 DIAGNOSIS — M791 Myalgia: Secondary | ICD-10-CM | POA: Diagnosis not present

## 2016-11-05 DIAGNOSIS — M47817 Spondylosis without myelopathy or radiculopathy, lumbosacral region: Secondary | ICD-10-CM | POA: Diagnosis not present

## 2016-12-03 DIAGNOSIS — Z79891 Long term (current) use of opiate analgesic: Secondary | ICD-10-CM | POA: Diagnosis not present

## 2016-12-03 DIAGNOSIS — M5416 Radiculopathy, lumbar region: Secondary | ICD-10-CM | POA: Diagnosis not present

## 2016-12-03 DIAGNOSIS — M791 Myalgia: Secondary | ICD-10-CM | POA: Diagnosis not present

## 2016-12-03 DIAGNOSIS — M47817 Spondylosis without myelopathy or radiculopathy, lumbosacral region: Secondary | ICD-10-CM | POA: Diagnosis not present

## 2016-12-03 DIAGNOSIS — G894 Chronic pain syndrome: Secondary | ICD-10-CM | POA: Diagnosis not present

## 2016-12-03 DIAGNOSIS — M461 Sacroiliitis, not elsewhere classified: Secondary | ICD-10-CM | POA: Diagnosis not present

## 2016-12-05 DIAGNOSIS — Z794 Long term (current) use of insulin: Secondary | ICD-10-CM | POA: Diagnosis not present

## 2016-12-05 DIAGNOSIS — E1165 Type 2 diabetes mellitus with hyperglycemia: Secondary | ICD-10-CM | POA: Diagnosis not present

## 2016-12-05 LAB — LIPID PANEL
CHOLESTEROL: 182 mg/dL (ref 0–200)
HDL: 56 mg/dL (ref 35–70)
LDL CALC: 72 mg/dL
Triglycerides: 270 mg/dL — AB (ref 40–160)

## 2016-12-05 LAB — MICROALBUMIN, URINE: Microalb, Ur: 14

## 2016-12-05 LAB — HEMOGLOBIN A1C: Hemoglobin A1C: 10.8

## 2016-12-12 DIAGNOSIS — Z794 Long term (current) use of insulin: Secondary | ICD-10-CM | POA: Diagnosis not present

## 2016-12-12 DIAGNOSIS — E1165 Type 2 diabetes mellitus with hyperglycemia: Secondary | ICD-10-CM | POA: Diagnosis not present

## 2016-12-23 ENCOUNTER — Encounter: Payer: Self-pay | Admitting: Family Medicine

## 2016-12-23 ENCOUNTER — Ambulatory Visit (INDEPENDENT_AMBULATORY_CARE_PROVIDER_SITE_OTHER): Payer: Medicare Other | Admitting: Family Medicine

## 2016-12-23 ENCOUNTER — Other Ambulatory Visit: Payer: Self-pay | Admitting: Family Medicine

## 2016-12-23 VITALS — BP 134/76 | HR 103 | Temp 98.0°F | Resp 18 | Ht 61.0 in | Wt 185.0 lb

## 2016-12-23 DIAGNOSIS — F330 Major depressive disorder, recurrent, mild: Secondary | ICD-10-CM | POA: Diagnosis not present

## 2016-12-23 DIAGNOSIS — E1169 Type 2 diabetes mellitus with other specified complication: Secondary | ICD-10-CM | POA: Diagnosis not present

## 2016-12-23 DIAGNOSIS — I10 Essential (primary) hypertension: Secondary | ICD-10-CM | POA: Diagnosis not present

## 2016-12-23 DIAGNOSIS — Z79899 Other long term (current) drug therapy: Secondary | ICD-10-CM

## 2016-12-23 DIAGNOSIS — E1143 Type 2 diabetes mellitus with diabetic autonomic (poly)neuropathy: Secondary | ICD-10-CM

## 2016-12-23 DIAGNOSIS — E1165 Type 2 diabetes mellitus with hyperglycemia: Secondary | ICD-10-CM | POA: Diagnosis not present

## 2016-12-23 DIAGNOSIS — IMO0002 Reserved for concepts with insufficient information to code with codable children: Secondary | ICD-10-CM

## 2016-12-23 DIAGNOSIS — E785 Hyperlipidemia, unspecified: Secondary | ICD-10-CM | POA: Diagnosis not present

## 2016-12-23 LAB — HEPATIC FUNCTION PANEL
ALK PHOS: 95 U/L (ref 33–130)
ALT: 15 U/L (ref 6–29)
AST: 19 U/L (ref 10–35)
Albumin: 3.9 g/dL (ref 3.6–5.1)
BILIRUBIN DIRECT: 0.1 mg/dL (ref <=0.2)
BILIRUBIN INDIRECT: 0.3 mg/dL (ref 0.2–1.2)
BILIRUBIN TOTAL: 0.4 mg/dL (ref 0.2–1.2)
Total Protein: 6.3 g/dL (ref 6.1–8.1)

## 2016-12-23 MED ORDER — OLMESARTAN-AMLODIPINE-HCTZ 40-10-25 MG PO TABS: 1.0000 | ORAL_TABLET | Freq: Every day | ORAL | 1 refills | Status: DC

## 2016-12-23 MED ORDER — PRAVASTATIN SODIUM 40 MG PO TABS: 40.0000 mg | ORAL_TABLET | Freq: Every day | ORAL | 1 refills | Status: DC

## 2016-12-23 MED ORDER — VENLAFAXINE HCL ER 75 MG PO CP24: ORAL_CAPSULE | ORAL | 1 refills | Status: DC

## 2016-12-23 MED ORDER — CARVEDILOL 6.25 MG PO TABS: 6.2500 mg | ORAL_TABLET | Freq: Two times a day (BID) | ORAL | 1 refills | Status: DC

## 2016-12-23 NOTE — Progress Notes (Signed)
Name: Lorraine Jones   MRN: [id number]    DOB: 1951/06/09   Date:12/23/2016       Progress Note  Subjective  Chief Complaint  Chief Complaint  Patient presents with  . Medication Refill    6 month F/U  . Hypertension    Denies any symptoms  . Hyperlipidemia  . Pain    Sees Dr. Primus Bravo for pain management  . Irritable Bowel Syndrome    Needs refills, takes medication daily and helps symptoms  . Diabetes    Sees Dr. Maretta Bees for DM  . Depression    Improving    HPI  DMII with neuropathy,dyslipidemia and gastroparesis: she is now on Metformin, Trulicity, Lantus 50 units , and Humalog  24 units before meals  Seeing Dr. Filbert Berthold now.  She states she has been compliant with her diet ( she has been trying to not have sweets)  checking glucose more often, 3 times daily, fasting is still in the 200's. She denies polydipsia or polyphagia, but she has intermittent episodes of polyuria. She is taking statins and aspirin daily also on ARB. She states her neuropathy is under control at this time, and indigestion is under control with Reglan before meals. Last triglycerides was up, she has been trying to cut down on steaks and carbohydrates.   HTN: she needs refill of Tribenzor and carvedilol, she is compliant with medication and bp is at goal. No chest pain or palpitation. No recent leg edema.  Chronic Pain: she sees Dr. Primus Bravo at least once a month, taking pain medication as prescribed, she has constipation secondary to narcotics and is taking Miralax to control symptoms.   Hyperlipidemia: taking Pravastatin and denies side effects, discussed switching to Atorvastatin but she is afraid of changing. She denies myalgias secondary to medication. No chest pain. Needs to follow a diet also  Osteopenia: she had a bone density back in 03/2016 , her FRAX score is 3.2 % for major osteoporotic fracture in the next 10 years and 0.4 for hip .   Depression Mild: she has a long of major depression, part of  it secondary to living in pain and inability to do things she likes. She states Effexor is helping, and also her daughter that keeps her motivated - she picks up to go out. She still has episodes of crying spells, no anhedonia, she denies suicidal thoughts or ideation. Recently getting frustrated with aging father, they are trying to place him in a nursing home.   Patient Active Problem List   Diagnosis Date Noted  . Major depression, recurrent ([place]) 06/25/2016  . Osteopenia 04/18/2016  . Gastroesophageal reflux disease without esophagitis 05/12/2015  . Chronic pain 05/12/2015  . Hyperlipidemia 05/12/2015  . DDD (degenerative disc disease), lumbar 02/27/2015  . Lumbar post-laminectomy syndrome 02/27/2015  . Neuropathy due to secondary diabetes ([place]) 02/27/2015  . Spinal stenosis, lumbar region, with neurogenic claudication 02/27/2015  . Sacroiliac joint dysfunction of both sides 02/27/2015  . Degenerative joint disease (DJD) of hip 02/27/2015  . DDD (degenerative disc disease), cervical 02/27/2015  . Bilateral occipital neuralgia 02/27/2015  . Chronic constipation 06/02/2014  . Hyponatremia 06/01/2014  . Hypertension, benign 06/01/2014    Past Surgical History:  Procedure Laterality Date  . CHOLECYSTECTOMY  1996  . CYSTOSCOPY WITH URETEROSCOPY Right 09/01/2013   Procedure: CYSTOSCOPY WITH UNROOFING  RIGHT URETEROCELE AND URETERAL STONE REMOVED  WITH GYRUS COLLINS KNIFE. ;  Surgeon: Irine Seal, MD;  Location: [facility];  Service: Urology;  Laterality: Right;  cysto, right uretersoscopy and stone extraction   collins knife  . LUMBAR DISC SURGERY  1993   L4  --  L5    Family History  Problem Relation Age of Onset  . Diabetes Mother   . Heart disease Mother   . Hypertension Mother     Social History   Social History  . Marital status: Married    Spouse name: N/A  . Number of children: N/A  . Years of education: N/A   Occupational History  . Not on file.    Social History Main Topics  . Smoking status: Current Every Day Smoker    Packs/day: 0.50    Years: 12.00    Types: Cigarettes  . Smokeless tobacco: Never Used  . Alcohol use No  . Drug use: No  . Sexual activity: Yes   Other Topics Concern  . Not on file   Social History Narrative  . No narrative on file     Current Outpatient Prescriptions:  .  aspirin EC 81 MG tablet, Take 81 mg by mouth daily., Disp: , Rfl:  .  carvedilol (COREG) 6.25 MG tablet, Take 1 tablet (6.25 mg total) by mouth 2 (two) times daily., Disp: 180 tablet, Rfl: 1 .  dorzolamide-timolol (COSOPT) 22.3-6.8 MG/ML ophthalmic solution, Place 1 drop into both eyes 2 (two) times daily., Disp: , Rfl:  .  Dulaglutide 1.5 MG/0.5ML SOPN, Inject into the skin., Disp: , Rfl:  .  fentaNYL (DURAGESIC - DOSED MCG/HR) 75 MCG/HR, Apply 2 patches to skin every 2 days if tolerated.   NOTE: Do not apply 100 g per hour fentanyl patch since insurance will not approve 100 g patch for you, Disp: 30 patch, Rfl: 0 .  fentaNYL (DURAGESIC - DOSED MCG/HR) 75 MCG/HR, Apply 2 patches to skin every 2 days if tolerated.   NOTE: Do not apply 100 g per hour fentanyl patch since insurance will not approve 100 g patch for you, Disp: 30 patch, Rfl: 0 .  insulin glargine (LANTUS) 100 UNIT/ML injection, Inject into the skin., Disp: , Rfl:  .  insulin lispro (HUMALOG) 100 UNIT/ML KiwkPen, Inject into the skin., Disp: , Rfl:  .  Insulin Pen Needle (PEN NEEDLES 31GX5/16") 31G X 8 MM MISC, , Disp: , Rfl:  .  metFORMIN (GLUCOPHAGE) 1000 MG tablet, Take 1 tablet (1,000 mg total) by mouth 2 (two) times daily with a meal., Disp: 180 tablet, Rfl: 1 .  metoCLOPramide (REGLAN) 5 MG tablet, Take 1 tablet (5 mg total) by mouth 3 (three) times daily., Disp: 270 tablet, Rfl: 1 .  Multiple Vitamins-Minerals (MULTIVITAMIN WITH MINERALS) tablet, Take 1 tablet by mouth daily., Disp: , Rfl:  .  Olmesartan-Amlodipine-HCTZ (TRIBENZOR) 40-10-25 MG TABS, Take 1 tablet by  mouth daily., Disp: 90 tablet, Rfl: 1 .  Oxycodone HCl 10 MG TABS, Limit 1 tab by mouth 4-8 times per day for breakthrough pain if tolerated while wearing fentanyl patches, Disp: 200 tablet, Rfl: 0 .  polyethylene glycol (MIRALAX / GLYCOLAX) packet, Take 17 g by mouth daily., Disp: 14 each, Rfl: 0 .  pravastatin (PRAVACHOL) 40 MG tablet, Take 1 tablet (40 mg total) by mouth daily., Disp: 90 tablet, Rfl: 1 .  venlafaxine XR (EFFEXOR-XR) 75 MG 24 hr capsule, TAKE ONE CAPSULE BY MOUTH ONCE DAILY FOR  DEPRESSION, Disp: 90 capsule, Rfl: 1  Allergies  Allergen Reactions  . Diclofenac Sodium Swelling  . Lodine [Etodolac] Swelling  . Naproxen Sodium Swelling  . Neurontin [  Gabapentin] Swelling     ROS  Constitutional: Negative for fever or significant  weight change.  Respiratory: Negative for cough and shortness of breath.   Cardiovascular: Negative for chest pain or palpitations.  Gastrointestinal: Negative for abdominal pain, no bowel changes.  Musculoskeletal: Positive  for gait problem but no  joint swelling.  Skin: Negative for rash.  Neurological: Negative for dizziness or headache.  No other specific complaints in a complete review of systems (except as listed in HPI above).  Objective  Vitals:   12/23/16 0847  BP: 134/76  Pulse: (!) 103  Resp: 18  Temp: 98 F (36.7 C)  TempSrc: Oral  SpO2: 95%  Weight: 185 lb (83.9 kg)  Height: 5\' 1"  (1.549 m)    Body mass index is 34.96 kg/m.  Physical Exam  Constitutional: Patient appears well-developed and well-nourished. Obese No distress.  HEENT: head atraumatic, normocephalic, pupils equal and reactive to light, neck supple, throat within normal limits Cardiovascular: Normal rate, regular rhythm and normal heart sounds.  No murmur heard. No BLE edema. Pulmonary/Chest: Effort normal and breath sounds normal. No respiratory distress. Abdominal: Soft.  There is no tenderness. Psychiatric: Patient has a normal mood and affect.  behavior is normal. Judgment and thought content normal. Muscular Skeletal: she uses a cane to assist with her ambulation  Recent Results (from the past 2160 hour(s))  Hemoglobin A1c     Status: None   Collection Time: 12/05/16 12:00 AM  Result Value Ref Range   Hemoglobin A1C 10.8      PHQ2/9: Depression screen [facility] 2/9 12/23/2016 06/25/2016 06/10/2016 04/09/2016 03/04/2016  Decreased Interest 0 0 0 0 0  Down, Depressed, Hopeless 0 0 0 0 0  PHQ - 2 Score 0 0 0 0 0  Altered sleeping - - - - -  Tired, decreased energy - - - - -  Change in appetite - - - - -  Feeling bad or failure about yourself  - - - - -  Trouble concentrating - - - - -  Moving slowly or fidgety/restless - - - - -  Suicidal thoughts - - - - -  PHQ-9 Score - - - - -  Difficult doing work/chores - - - - -     Fall Risk: Fall Risk  12/23/2016 06/25/2016 06/10/2016 04/09/2016 03/06/2016  Falls in the past year? No No No No No  Risk for fall due to : - - - - -     Functional Status Survey: Is the patient deaf or have difficulty hearing?: No Does the patient have difficulty seeing, even when wearing glasses/contacts?: No Does the patient have difficulty concentrating, remembering, or making decisions?: No Does the patient have difficulty walking or climbing stairs?: Yes (walks with a cane) Does the patient have difficulty dressing or bathing?: No Does the patient have difficulty doing errands alone such as visiting a doctor's office or shopping?: No    Assessment & Plan  1. Hypertension, benign  Well controlled - carvedilol (COREG) 6.25 MG tablet; Take 1 tablet (6.25 mg total) by mouth 2 (two) times daily.  Dispense: 180 tablet; Refill: 1 - Olmesartan-Amlodipine-HCTZ (TRIBENZOR) 40-10-25 MG TABS; Take 1 tablet by mouth daily.  Dispense: 90 tablet; Refill: 1  2. Uncontrolled type 2 diabetes with peripheral autonomic neuropathy (HCC)  Seeing Dr. Graceann Congress, hgbA1C has improved down from over 13to 10.8, continue  current regiment  3. Type 2 diabetes, uncontrolled, with gastroparesis (HCC)  Taking Reglan prn   4. Dyslipidemia associated  with type 2 diabetes mellitus ([place])  Reviewed labs done by Endocrinologist  - pravastatin (PRAVACHOL) 40 MG tablet; Take 1 tablet (40 mg total) by mouth daily.  Dispense: 90 tablet; Refill: 1  5. Mild episode of recurrent major depressive disorder (HCC)  - venlafaxine XR (EFFEXOR-XR) 75 MG 24 hr capsule; TAKE ONE CAPSULE BY MOUTH ONCE DAILY FOR  DEPRESSION  Dispense: 90 capsule; Refill: 1  6. Long-term use of high-risk medication  - Hepatic function panel

## 2017-01-01 DIAGNOSIS — M791 Myalgia: Secondary | ICD-10-CM | POA: Diagnosis not present

## 2017-01-01 DIAGNOSIS — M48061 Spinal stenosis, lumbar region without neurogenic claudication: Secondary | ICD-10-CM | POA: Diagnosis not present

## 2017-01-01 DIAGNOSIS — M47816 Spondylosis without myelopathy or radiculopathy, lumbar region: Secondary | ICD-10-CM | POA: Diagnosis not present

## 2017-01-01 DIAGNOSIS — M47817 Spondylosis without myelopathy or radiculopathy, lumbosacral region: Secondary | ICD-10-CM | POA: Diagnosis not present

## 2017-01-01 DIAGNOSIS — M5416 Radiculopathy, lumbar region: Secondary | ICD-10-CM | POA: Diagnosis not present

## 2017-01-01 DIAGNOSIS — M461 Sacroiliitis, not elsewhere classified: Secondary | ICD-10-CM | POA: Diagnosis not present

## 2017-01-01 DIAGNOSIS — M5137 Other intervertebral disc degeneration, lumbosacral region: Secondary | ICD-10-CM | POA: Diagnosis not present

## 2017-01-01 DIAGNOSIS — M5136 Other intervertebral disc degeneration, lumbar region: Secondary | ICD-10-CM | POA: Diagnosis not present

## 2017-01-01 DIAGNOSIS — Z79891 Long term (current) use of opiate analgesic: Secondary | ICD-10-CM | POA: Diagnosis not present

## 2017-01-28 DIAGNOSIS — Z79891 Long term (current) use of opiate analgesic: Secondary | ICD-10-CM | POA: Diagnosis not present

## 2017-01-28 DIAGNOSIS — M461 Sacroiliitis, not elsewhere classified: Secondary | ICD-10-CM | POA: Diagnosis not present

## 2017-01-28 DIAGNOSIS — E0843 Diabetes mellitus due to underlying condition with diabetic autonomic (poly)neuropathy: Secondary | ICD-10-CM | POA: Diagnosis not present

## 2017-01-28 DIAGNOSIS — M5136 Other intervertebral disc degeneration, lumbar region: Secondary | ICD-10-CM | POA: Diagnosis not present

## 2017-01-28 DIAGNOSIS — M47817 Spondylosis without myelopathy or radiculopathy, lumbosacral region: Secondary | ICD-10-CM | POA: Diagnosis not present

## 2017-01-28 DIAGNOSIS — M48061 Spinal stenosis, lumbar region without neurogenic claudication: Secondary | ICD-10-CM | POA: Diagnosis not present

## 2017-01-28 DIAGNOSIS — M5416 Radiculopathy, lumbar region: Secondary | ICD-10-CM | POA: Diagnosis not present

## 2017-01-28 DIAGNOSIS — M5137 Other intervertebral disc degeneration, lumbosacral region: Secondary | ICD-10-CM | POA: Diagnosis not present

## 2017-01-28 DIAGNOSIS — M791 Myalgia: Secondary | ICD-10-CM | POA: Diagnosis not present

## 2017-02-04 ENCOUNTER — Other Ambulatory Visit: Payer: Self-pay | Admitting: Family Medicine

## 2017-02-04 DIAGNOSIS — Z9842 Cataract extraction status, left eye: Secondary | ICD-10-CM | POA: Diagnosis not present

## 2017-02-04 DIAGNOSIS — H40023 Open angle with borderline findings, high risk, bilateral: Secondary | ICD-10-CM | POA: Diagnosis not present

## 2017-02-04 DIAGNOSIS — Z9841 Cataract extraction status, right eye: Secondary | ICD-10-CM | POA: Diagnosis not present

## 2017-02-04 DIAGNOSIS — H40053 Ocular hypertension, bilateral: Secondary | ICD-10-CM | POA: Diagnosis not present

## 2017-02-04 DIAGNOSIS — E785 Hyperlipidemia, unspecified: Secondary | ICD-10-CM

## 2017-02-04 DIAGNOSIS — E119 Type 2 diabetes mellitus without complications: Secondary | ICD-10-CM | POA: Diagnosis not present

## 2017-02-05 LAB — HM DIABETES EYE EXAM

## 2017-02-11 ENCOUNTER — Encounter: Payer: Self-pay | Admitting: Family Medicine

## 2017-02-26 DIAGNOSIS — M5416 Radiculopathy, lumbar region: Secondary | ICD-10-CM | POA: Diagnosis not present

## 2017-02-26 DIAGNOSIS — M461 Sacroiliitis, not elsewhere classified: Secondary | ICD-10-CM | POA: Diagnosis not present

## 2017-02-26 DIAGNOSIS — M791 Myalgia: Secondary | ICD-10-CM | POA: Diagnosis not present

## 2017-02-26 DIAGNOSIS — M47817 Spondylosis without myelopathy or radiculopathy, lumbosacral region: Secondary | ICD-10-CM | POA: Diagnosis not present

## 2017-03-05 DIAGNOSIS — E1165 Type 2 diabetes mellitus with hyperglycemia: Secondary | ICD-10-CM | POA: Diagnosis not present

## 2017-03-05 DIAGNOSIS — Z794 Long term (current) use of insulin: Secondary | ICD-10-CM | POA: Diagnosis not present

## 2017-03-10 ENCOUNTER — Other Ambulatory Visit: Payer: Self-pay

## 2017-03-10 DIAGNOSIS — I10 Essential (primary) hypertension: Secondary | ICD-10-CM

## 2017-03-10 MED ORDER — OLMESARTAN-AMLODIPINE-HCTZ 40-10-25 MG PO TABS: 1.0000 | ORAL_TABLET | Freq: Every day | ORAL | 1 refills | Status: DC

## 2017-03-10 NOTE — Telephone Encounter (Signed)
Patient requesting refill of Tribenzor to [facility].

## 2017-03-14 ENCOUNTER — Other Ambulatory Visit: Payer: Self-pay | Admitting: Family Medicine

## 2017-03-14 DIAGNOSIS — Z1231 Encounter for screening mammogram for malignant neoplasm of breast: Secondary | ICD-10-CM

## 2017-03-31 ENCOUNTER — Other Ambulatory Visit: Payer: Self-pay | Admitting: Pain Medicine

## 2017-03-31 ENCOUNTER — Telehealth: Payer: Self-pay

## 2017-03-31 DIAGNOSIS — M47817 Spondylosis without myelopathy or radiculopathy, lumbosacral region: Secondary | ICD-10-CM | POA: Diagnosis not present

## 2017-03-31 DIAGNOSIS — M5416 Radiculopathy, lumbar region: Secondary | ICD-10-CM | POA: Diagnosis not present

## 2017-03-31 DIAGNOSIS — M461 Sacroiliitis, not elsewhere classified: Secondary | ICD-10-CM | POA: Diagnosis not present

## 2017-03-31 DIAGNOSIS — M791 Myalgia: Secondary | ICD-10-CM | POA: Diagnosis not present

## 2017-03-31 NOTE — Telephone Encounter (Signed)
Patient states Dr. Primus Bravo wants patient to see a neurosurgeon but Dr. Ancil Boozer would have to put the referral in.

## 2017-04-01 NOTE — Telephone Encounter (Signed)
Patient notified and was transferred up front to make an appt.

## 2017-04-01 NOTE — Telephone Encounter (Signed)
We can make referral during her visit, neurosurgeon will need documentation and I will need to see note from Dr. Primus Bravo

## 2017-04-04 ENCOUNTER — Ambulatory Visit (INDEPENDENT_AMBULATORY_CARE_PROVIDER_SITE_OTHER): Payer: Medicare Other | Admitting: Family Medicine

## 2017-04-04 ENCOUNTER — Other Ambulatory Visit: Payer: Self-pay | Admitting: Pain Medicine

## 2017-04-04 ENCOUNTER — Encounter: Payer: Self-pay | Admitting: Family Medicine

## 2017-04-04 VITALS — BP 146/66 | HR 92 | Temp 98.0°F | Resp 16 | Ht 61.0 in | Wt 174.4 lb

## 2017-04-04 DIAGNOSIS — M5137 Other intervertebral disc degeneration, lumbosacral region: Secondary | ICD-10-CM

## 2017-04-04 DIAGNOSIS — R634 Abnormal weight loss: Secondary | ICD-10-CM

## 2017-04-04 DIAGNOSIS — E0843 Diabetes mellitus due to underlying condition with diabetic autonomic (poly)neuropathy: Secondary | ICD-10-CM

## 2017-04-04 DIAGNOSIS — M51379 Other intervertebral disc degeneration, lumbosacral region without mention of lumbar back pain or lower extremity pain: Secondary | ICD-10-CM

## 2017-04-04 DIAGNOSIS — M5136 Other intervertebral disc degeneration, lumbar region: Secondary | ICD-10-CM | POA: Diagnosis not present

## 2017-04-04 DIAGNOSIS — M48062 Spinal stenosis, lumbar region with neurogenic claudication: Secondary | ICD-10-CM | POA: Diagnosis not present

## 2017-04-04 DIAGNOSIS — F331 Major depressive disorder, recurrent, moderate: Secondary | ICD-10-CM

## 2017-04-04 DIAGNOSIS — Z96642 Presence of left artificial hip joint: Secondary | ICD-10-CM

## 2017-04-04 MED ORDER — VENLAFAXINE HCL ER 150 MG PO CP24: ORAL_CAPSULE | ORAL | 0 refills | Status: DC

## 2017-04-04 NOTE — Progress Notes (Signed)
Name: Lorraine Jones   MRN: [id number]    DOB: 11-Mar-1951   Date:04/04/2017       Progress Note  Subjective  Chief Complaint  Chief Complaint  Patient presents with  . Referral    Neurosurgeon  . Back Pain    Has become worst and seeing Dr. Primus Bravo for pain management and recommend her to go to Neurosurgeon and have a MRI done. Patient has lost her appetitie due to the pain and energy. she has lost 11 pounds since her last visit.    HPI  Back pain/Depression/weight loss: she sees Dr. Primus Bravo and pain was stable, until about one month ago when pain because to intense to bear, pain is worse on left lower back and radiates to left leg. Pain is constant and it can go from 4-10/10. Affecting her mood, her motivation, ability to sleep, and has not appetite. She has lost 11 lbs since last visit, but she thinks all the weight loss happened over the last month. We discussed labs and increasing dose of Effexor, she wants to try increasing medication now, she has MRI lumbar spine scheduled for next week, we will place neurosurgical referral as recommended by Dr. Primus Bravo   Patient Active Problem List   Diagnosis Date Noted  . Major depression, recurrent ([place]) 06/25/2016  . Osteopenia 04/18/2016  . Gastroesophageal reflux disease without esophagitis 05/12/2015  . Chronic pain 05/12/2015  . Hyperlipidemia 05/12/2015  . DDD (degenerative disc disease), lumbar 02/27/2015  . Lumbar post-laminectomy syndrome 02/27/2015  . Neuropathy due to secondary diabetes ([place]) 02/27/2015  . Spinal stenosis, lumbar region, with neurogenic claudication 02/27/2015  . Sacroiliac joint dysfunction of both sides 02/27/2015  . Degenerative joint disease (DJD) of hip 02/27/2015  . DDD (degenerative disc disease), cervical 02/27/2015  . Bilateral occipital neuralgia 02/27/2015  . Chronic constipation 06/02/2014  . Hyponatremia 06/01/2014  . Hypertension, benign 06/01/2014    Past Surgical History:  Procedure Laterality Date   . CHOLECYSTECTOMY  1996  . CYSTOSCOPY WITH URETEROSCOPY Right 09/01/2013   Procedure: CYSTOSCOPY WITH UNROOFING  RIGHT URETEROCELE AND URETERAL STONE REMOVED  WITH GYRUS COLLINS KNIFE. ;  Surgeon: Irine Seal, MD;  Location: [facility];  Service: Urology;  Laterality: Right;  cysto, right uretersoscopy and stone extraction   collins knife  . LUMBAR DISC SURGERY  1993   L4  --  L5    Family History  Problem Relation Age of Onset  . Diabetes Mother   . Heart disease Mother   . Hypertension Mother     Social History   Social History  . Marital status: Married    Spouse name: N/A  . Number of children: N/A  . Years of education: N/A   Occupational History  . Not on file.   Social History Main Topics  . Smoking status: Current Every Day Smoker    Packs/day: 0.50    Years: 12.00    Types: Cigarettes  . Smokeless tobacco: Never Used  . Alcohol use No  . Drug use: No  . Sexual activity: Yes   Other Topics Concern  . Not on file   Social History Narrative  . No narrative on file     Current Outpatient Prescriptions:  .  aspirin EC 81 MG tablet, Take 81 mg by mouth daily., Disp: , Rfl:  .  carvedilol (COREG) 6.25 MG tablet, Take 1 tablet (6.25 mg total) by mouth 2 (two) times daily., Disp: 180 tablet, Rfl: 1 .  dorzolamide-timolol (  COSOPT) 22.3-6.8 MG/ML ophthalmic solution, Place 1 drop into both eyes 2 (two) times daily., Disp: , Rfl:  .  fentaNYL (DURAGESIC - DOSED MCG/HR) 75 MCG/HR, Apply 2 patches to skin every 2 days if tolerated.   NOTE: Do not apply 100 g per hour fentanyl patch since insurance will not approve 100 g patch for you, Disp: 30 patch, Rfl: 0 .  fentaNYL (DURAGESIC - DOSED MCG/HR) 75 MCG/HR, Apply 2 patches to skin every 2 days if tolerated.   NOTE: Do not apply 100 g per hour fentanyl patch since insurance will not approve 100 g patch for you, Disp: 30 patch, Rfl: 0 .  glucose blood (ONE TOUCH ULTRA TEST) test strip, 1 strip by Other  route 2 (two) times daily., Disp: , Rfl:  .  Insulin Glargine (LANTUS SOLOSTAR) 100 UNIT/ML Solostar Pen, Inject 25 Units into the skin 2 (two) times daily., Disp: , Rfl:  .  insulin lispro (HUMALOG) 100 UNIT/ML KiwkPen, Inject 20 Units into the skin 3 (three) times daily after meals., Disp: , Rfl:  .  metFORMIN (GLUCOPHAGE) 1000 MG tablet, Take 1 tablet (1,000 mg total) by mouth 2 (two) times daily with a meal., Disp: 180 tablet, Rfl: 1 .  metoCLOPramide (REGLAN) 5 MG tablet, Take 1 tablet (5 mg total) by mouth 3 (three) times daily., Disp: 270 tablet, Rfl: 1 .  Multiple Vitamins-Minerals (MULTIVITAMIN WITH MINERALS) tablet, Take 1 tablet by mouth daily., Disp: , Rfl:  .  Olmesartan-Amlodipine-HCTZ (TRIBENZOR) 40-10-25 MG TABS, Take 1 tablet by mouth daily., Disp: 90 tablet, Rfl: 1 .  Oxycodone HCl 10 MG TABS, Limit 1 tab by mouth 4-8 times per day for breakthrough pain if tolerated while wearing fentanyl patches, Disp: 200 tablet, Rfl: 0 .  polyethylene glycol (MIRALAX / GLYCOLAX) packet, Take 17 g by mouth daily., Disp: 14 each, Rfl: 0 .  pravastatin (PRAVACHOL) 40 MG tablet, Take 1 tablet (40 mg total) by mouth daily., Disp: 90 tablet, Rfl: 1 .  venlafaxine XR (EFFEXOR-XR) 75 MG 24 hr capsule, TAKE ONE CAPSULE BY MOUTH ONCE DAILY FOR  DEPRESSION, Disp: 90 capsule, Rfl: 1  Allergies  Allergen Reactions  . Diclofenac Sodium Swelling  . Lodine [Etodolac] Swelling  . Naproxen Sodium Swelling  . Neurontin [Gabapentin] Swelling     ROS  Ten systems reviewed and is negative except as mentioned in HPI   Objective  Vitals:   04/04/17 1146  BP: (!) 146/66  Pulse: 92  Resp: 16  Temp: 98 F (36.7 C)  TempSrc: Oral  SpO2: 96%  Weight: 174 lb 6.4 oz (79.1 kg)  Height: 5\' 1"  (1.549 m)    Body mass index is 32.95 kg/m.  Physical Exam  Constitutional: Patient appears well-developed and well-nourished. Obese No distress.  HEENT: head atraumatic, normocephalic, pupils equal and  reactive to light,neck supple, throat within normal limits Cardiovascular: Normal rate, regular rhythm and normal heart sounds.  No murmur heard. No BLE edema. Pulmonary/Chest: Effort normal and breath sounds normal. No respiratory distress. Abdominal: Soft.  There is no tenderness. Psychiatric: Patient has a normal mood and affect. behavior is normal. Judgment and thought content normal. Muscular Skeletal: pain during palpation of left lower back, negative straight leg raise, pain with flexion and extension and right lateral bending. Using cane to assist with gait  Recent Results (from the past 2160 hour(s))  HM DIABETES EYE EXAM     Status: None   Collection Time: 02/05/17 12:00 AM  Result Value Ref Range  HM Diabetic Eye Exam No Retinopathy No Retinopathy    Comment: Dr. Rick Duff, [place]      PHQ2/9: Depression screen [facility] 2/9 04/04/2017 12/23/2016 06/25/2016 06/10/2016 04/09/2016  Decreased Interest 3 0 0 0 0  Down, Depressed, Hopeless 1 0 0 0 0  PHQ - 2 Score 4 0 0 0 0  Altered sleeping 3 - - - -  Tired, decreased energy 3 - - - -  Change in appetite 3 - - - -  Feeling bad or failure about yourself  0 - - - -  Trouble concentrating 0 - - - -  Moving slowly or fidgety/restless 1 - - - -  Suicidal thoughts 0 - - - -  PHQ-9 Score 14 - - - -  Difficult doing work/chores Somewhat difficult - - - -     Fall Risk: Fall Risk  04/04/2017 12/23/2016 06/25/2016 06/10/2016 04/09/2016  Falls in the past year? No No No No No  Risk for fall due to : - - - - -     Functional Status Survey: Is the patient deaf or have difficulty hearing?: No Does the patient have difficulty seeing, even when wearing glasses/contacts?: No Does the patient have difficulty concentrating, remembering, or making decisions?: No Does the patient have difficulty walking or climbing stairs?: Yes Does the patient have difficulty dressing or bathing?: No Does the patient have difficulty doing errands  alone such as visiting a doctor's office or shopping?: No   Assessment & Plan   1. DDD (degenerative disc disease), lumbar  - Ambulatory referral to Neurosurgery  2. Spinal stenosis, lumbar region, with neurogenic claudication  - Ambulatory referral to Neurosurgery  3. Weight loss  She states she is in a lot of pain and is not eating well and can't sleep, discussed getting labs but she would like to hold off for now, we will check next visit if still losing weight   4. Moderate episode of recurrent major depressive disorder (HCC)  - venlafaxine XR (EFFEXOR-XR) 150 MG 24 hr capsule; TAKE ONE CAPSULE BY MOUTH ONCE DAILY FOR  DEPRESSION  Dispense: 90 capsule; Refill: 0

## 2017-04-07 ENCOUNTER — Other Ambulatory Visit: Payer: Self-pay | Admitting: Pain Medicine

## 2017-04-07 DIAGNOSIS — M5137 Other intervertebral disc degeneration, lumbosacral region: Secondary | ICD-10-CM

## 2017-04-16 ENCOUNTER — Ambulatory Visit
Admission: RE | Admit: 2017-04-16 | Discharge: 2017-04-16 | Disposition: A | Discharge status: Home / Self Care | Payer: Medicare Other | Source: Ambulatory Visit | Attending: Pain Medicine | Admitting: Pain Medicine

## 2017-04-16 DIAGNOSIS — M48061 Spinal stenosis, lumbar region without neurogenic claudication: Secondary | ICD-10-CM | POA: Insufficient documentation

## 2017-04-16 DIAGNOSIS — Z96642 Presence of left artificial hip joint: Secondary | ICD-10-CM | POA: Diagnosis not present

## 2017-04-16 DIAGNOSIS — M5124 Other intervertebral disc displacement, thoracic region: Secondary | ICD-10-CM | POA: Diagnosis not present

## 2017-04-16 DIAGNOSIS — E0843 Diabetes mellitus due to underlying condition with diabetic autonomic (poly)neuropathy: Secondary | ICD-10-CM | POA: Insufficient documentation

## 2017-04-16 DIAGNOSIS — M5137 Other intervertebral disc degeneration, lumbosacral region: Secondary | ICD-10-CM

## 2017-04-16 DIAGNOSIS — M47816 Spondylosis without myelopathy or radiculopathy, lumbar region: Secondary | ICD-10-CM | POA: Diagnosis not present

## 2017-04-16 DIAGNOSIS — M5416 Radiculopathy, lumbar region: Secondary | ICD-10-CM | POA: Insufficient documentation

## 2017-04-16 DIAGNOSIS — M5126 Other intervertebral disc displacement, lumbar region: Secondary | ICD-10-CM | POA: Diagnosis not present

## 2017-04-17 DIAGNOSIS — M5136 Other intervertebral disc degeneration, lumbar region: Secondary | ICD-10-CM | POA: Diagnosis not present

## 2017-04-17 DIAGNOSIS — M5416 Radiculopathy, lumbar region: Secondary | ICD-10-CM | POA: Diagnosis not present

## 2017-04-22 ENCOUNTER — Ambulatory Visit
Admission: RE | Admit: 2017-04-22 | Discharge: 2017-04-22 | Disposition: A | Discharge status: Home / Self Care | Payer: Medicare Other | Source: Ambulatory Visit | Attending: Family Medicine | Admitting: Family Medicine

## 2017-04-22 DIAGNOSIS — Z1231 Encounter for screening mammogram for malignant neoplasm of breast: Secondary | ICD-10-CM | POA: Insufficient documentation

## 2017-04-28 DIAGNOSIS — M5416 Radiculopathy, lumbar region: Secondary | ICD-10-CM | POA: Diagnosis not present

## 2017-04-28 DIAGNOSIS — M791 Myalgia: Secondary | ICD-10-CM | POA: Diagnosis not present

## 2017-04-28 DIAGNOSIS — M47817 Spondylosis without myelopathy or radiculopathy, lumbosacral region: Secondary | ICD-10-CM | POA: Diagnosis not present

## 2017-04-28 DIAGNOSIS — M461 Sacroiliitis, not elsewhere classified: Secondary | ICD-10-CM | POA: Diagnosis not present

## 2017-05-05 ENCOUNTER — Ambulatory Visit: Payer: Medicare Other

## 2017-05-16 ENCOUNTER — Encounter: Payer: Self-pay | Admitting: Family Medicine

## 2017-05-16 ENCOUNTER — Ambulatory Visit (INDEPENDENT_AMBULATORY_CARE_PROVIDER_SITE_OTHER): Payer: Medicare Other | Admitting: Family Medicine

## 2017-05-16 VITALS — BP 139/73 | HR 99 | Temp 98.1°F | Resp 17 | Ht 61.0 in | Wt 174.7 lb

## 2017-05-16 DIAGNOSIS — H6121 Impacted cerumen, right ear: Secondary | ICD-10-CM

## 2017-05-16 DIAGNOSIS — H938X1 Other specified disorders of right ear: Secondary | ICD-10-CM | POA: Diagnosis not present

## 2017-05-16 NOTE — Progress Notes (Signed)
Name: Lorraine Jones   MRN: [id number]    DOB: 01-20-1951   Date:05/16/2017       Progress Note  Subjective  Chief Complaint  Chief Complaint  Patient presents with  . Ear Fullness    2-3 weeks    HPI  Ear fullness: she noticed ear fullness and hearing loss on the right side of ear, she tried otc ear wax removal without help. Denies cold symptoms or allergies. No sore throat, headaches or vertigo or tinnitus. No fever.   Patient Active Problem List   Diagnosis Date Noted  . Major depression, recurrent ([place]) 06/25/2016  . Osteopenia 04/18/2016  . Gastroesophageal reflux disease without esophagitis 05/12/2015  . Chronic pain 05/12/2015  . Hyperlipidemia 05/12/2015  . DDD (degenerative disc disease), lumbar 02/27/2015  . Lumbar post-laminectomy syndrome 02/27/2015  . Neuropathy due to secondary diabetes ([place]) 02/27/2015  . Spinal stenosis, lumbar region, with neurogenic claudication 02/27/2015  . Sacroiliac joint dysfunction of both sides 02/27/2015  . Degenerative joint disease (DJD) of hip 02/27/2015  . DDD (degenerative disc disease), cervical 02/27/2015  . Bilateral occipital neuralgia 02/27/2015  . Chronic constipation 06/02/2014  . Hyponatremia 06/01/2014  . Hypertension, benign 06/01/2014    Past Surgical History:  Procedure Laterality Date  . CHOLECYSTECTOMY  1996  . CYSTOSCOPY WITH URETEROSCOPY Right 09/01/2013   Procedure: CYSTOSCOPY WITH UNROOFING  RIGHT URETEROCELE AND URETERAL STONE REMOVED  WITH GYRUS COLLINS KNIFE. ;  Surgeon: Irine Seal, MD;  Location: [facility];  Service: Urology;  Laterality: Right;  cysto, right uretersoscopy and stone extraction   collins knife  . LUMBAR DISC SURGERY  1993   L4  --  L5    Family History  Problem Relation Age of Onset  . Diabetes Mother   . Heart disease Mother   . Hypertension Mother     Social History   Social History  . Marital status: Married    Spouse name: N/A  . Number of children: N/A   . Years of education: N/A   Occupational History  . Not on file.   Social History Main Topics  . Smoking status: Current Every Day Smoker    Packs/day: 0.50    Years: 12.00    Types: Cigarettes  . Smokeless tobacco: Never Used  . Alcohol use No  . Drug use: No  . Sexual activity: Yes   Other Topics Concern  . Not on file   Social History Narrative  . No narrative on file     Current Outpatient Prescriptions:  .  aspirin EC 81 MG tablet, Take 81 mg by mouth daily., Disp: , Rfl:  .  carvedilol (COREG) 6.25 MG tablet, Take 1 tablet (6.25 mg total) by mouth 2 (two) times daily., Disp: 180 tablet, Rfl: 1 .  dorzolamide-timolol (COSOPT) 22.3-6.8 MG/ML ophthalmic solution, Place 1 drop into both eyes 2 (two) times daily., Disp: , Rfl:  .  fentaNYL (DURAGESIC - DOSED MCG/HR) 75 MCG/HR, Apply 2 patches to skin every 2 days if tolerated.   NOTE: Do not apply 100 g per hour fentanyl patch since insurance will not approve 100 g patch for you, Disp: 30 patch, Rfl: 0 .  fentaNYL (DURAGESIC - DOSED MCG/HR) 75 MCG/HR, Apply 2 patches to skin every 2 days if tolerated.   NOTE: Do not apply 100 g per hour fentanyl patch since insurance will not approve 100 g patch for you, Disp: 30 patch, Rfl: 0 .  glucose blood (ONE TOUCH ULTRA TEST)  test strip, 1 strip by Other route 2 (two) times daily., Disp: , Rfl:  .  Insulin Glargine (LANTUS SOLOSTAR) 100 UNIT/ML Solostar Pen, Inject 25 Units into the skin 2 (two) times daily., Disp: , Rfl:  .  insulin lispro (HUMALOG) 100 UNIT/ML KiwkPen, Inject 20 Units into the skin 3 (three) times daily after meals., Disp: , Rfl:  .  metFORMIN (GLUCOPHAGE) 1000 MG tablet, Take 1 tablet (1,000 mg total) by mouth 2 (two) times daily with a meal., Disp: 180 tablet, Rfl: 1 .  metoCLOPramide (REGLAN) 5 MG tablet, Take 1 tablet (5 mg total) by mouth 3 (three) times daily., Disp: 270 tablet, Rfl: 1 .  Multiple Vitamins-Minerals (MULTIVITAMIN WITH MINERALS) tablet, Take 1  tablet by mouth daily., Disp: , Rfl:  .  Olmesartan-Amlodipine-HCTZ (TRIBENZOR) 40-10-25 MG TABS, Take 1 tablet by mouth daily., Disp: 90 tablet, Rfl: 1 .  Oxycodone HCl 10 MG TABS, Limit 1 tab by mouth 4-8 times per day for breakthrough pain if tolerated while wearing fentanyl patches, Disp: 200 tablet, Rfl: 0 .  polyethylene glycol (MIRALAX / GLYCOLAX) packet, Take 17 g by mouth daily., Disp: 14 each, Rfl: 0 .  pravastatin (PRAVACHOL) 40 MG tablet, Take 1 tablet (40 mg total) by mouth daily., Disp: 90 tablet, Rfl: 1 .  venlafaxine XR (EFFEXOR-XR) 150 MG 24 hr capsule, TAKE ONE CAPSULE BY MOUTH ONCE DAILY FOR  DEPRESSION, Disp: 90 capsule, Rfl: 0  Allergies  Allergen Reactions  . Diclofenac Sodium Swelling  . Lodine [Etodolac] Swelling  . Naproxen Sodium Swelling  . Neurontin [Gabapentin] Swelling     ROS  Ten systems reviewed and is negative except as mentioned in HPI  Chronic pain, sees pain clinic.   Objective  Vitals:   05/16/17 0757  BP: 139/73  Pulse: 99  Resp: 17  Temp: 98.1 F (36.7 C)  TempSrc: Oral  SpO2: 96%  Weight: 174 lb 11.2 oz (79.2 kg)  Height: 5\' 1"  (1.549 m)    Body mass index is 33.01 kg/m.  Physical Exam  Constitutional: Patient appears well-developed and well-nourished. Obese  No distress.  HEENT: head atraumatic, normocephalic, pupils equal and reactive to light, ears left ear canal patent and normal TM, right side filled with white material, no erythema  neck supple, throat within normal limits Cardiovascular: Normal rate, regular rhythm and normal heart sounds.  No murmur heard. No BLE edema. Pulmonary/Chest: Effort normal and breath sounds normal. No respiratory distress. Abdominal: Soft.  There is no tenderness. Psychiatric: Patient has a normal mood and affect. behavior is normal. Judgment and thought content normal.  PHQ2/9: Depression screen [facility] 2/9 05/16/2017 04/04/2017 12/23/2016 06/25/2016 06/10/2016  Decreased Interest 0 3 0 0 0  Down,  Depressed, Hopeless 0 1 0 0 0  PHQ - 2 Score 0 4 0 0 0  Altered sleeping - 3 - - -  Tired, decreased energy - 3 - - -  Change in appetite - 3 - - -  Feeling bad or failure about yourself  - 0 - - -  Trouble concentrating - 0 - - -  Moving slowly or fidgety/restless - 1 - - -  Suicidal thoughts - 0 - - -  PHQ-9 Score - 14 - - -  Difficult doing work/chores - Somewhat difficult - - -     Fall Risk: Fall Risk  05/16/2017 04/04/2017 12/23/2016 06/25/2016 06/10/2016  Falls in the past year? No No No No No  Comment - - - - -  Risk  for fall due to : - - - - -      Functional Status Survey: Is the patient deaf or have difficulty hearing?: No Does the patient have difficulty seeing, even when wearing glasses/contacts?: Yes (glasses) Does the patient have difficulty concentrating, remembering, or making decisions?: No Does the patient have difficulty walking or climbing stairs?: Yes Does the patient have difficulty dressing or bathing?: No Does the patient have difficulty doing errands alone such as visiting a doctor's office or shopping?: No    Assessment & Plan  1. Sensation of fullness in right ear  Likely from cerumen impaction, discussed mixing half peroxide and half warm water and apply a few drops both ears a few times a week to prevent recurrence  2. Impacted cerumen of right ear  - Ear Lavage  Verbal consent given Possible side effects discussed with patient Ears were  lavaged with warm water and peroxide  Patient tolerated procedure well No complications

## 2017-05-16 NOTE — Patient Instructions (Signed)
Earwax Buildup, Adult The ears produce a substance called earwax that helps keep bacteria out of the ear and protects the skin in the ear canal. Occasionally, earwax can build up in the ear and cause discomfort or hearing loss. What increases the risk? This condition is more likely to develop in people who:  Are female.  Are elderly.  Naturally produce more earwax.  Clean their ears often with cotton swabs.  Use earplugs often.  Use in-ear headphones often.  Wear hearing aids.  Have narrow ear canals.  Have earwax that is overly thick or sticky.  Have eczema.  Are dehydrated.  Have excess hair in the ear canal.  What are the signs or symptoms? Symptoms of this condition include:  Reduced or muffled hearing.  A feeling of fullness in the ear or feeling that the ear is plugged.  Fluid coming from the ear.  Ear pain.  Ear itch.  Ringing in the ear.  Coughing.  An obvious piece of earwax that can be seen inside the ear canal.  How is this diagnosed? This condition may be diagnosed based on:  Your symptoms.  Your medical history.  An ear exam. During the exam, your health care provider will look into your ear with an instrument called an otoscope.  You may have tests, including a hearing test. How is this treated? This condition may be treated by:  Using ear drops to soften the earwax.  Having the earwax removed by a health care provider. The health care provider may: ? Flush the ear with water. ? Use an instrument that has a loop on the end (curette). ? Use a suction device.  Surgery to remove the wax buildup. This may be done in severe cases.  Follow these instructions at home:  Take over-the-counter and prescription medicines only as told by your health care provider.  Do not put any objects, including cotton swabs, into your ear. You can clean the opening of your ear canal with a washcloth or facial tissue.  Follow instructions from your health  care provider about cleaning your ears. Do not over-clean your ears.  Drink enough fluid to keep your urine clear or pale yellow. This will help to thin the earwax.  Keep all follow-up visits as told by your health care provider. If earwax builds up in your ears often or if you use hearing aids, consider seeing your health care provider for routine, preventive ear cleanings. Ask your health care provider how often you should schedule your cleanings.  If you have hearing aids, clean them according to instructions from the manufacturer and your health care provider. Contact a health care provider if:  You have ear pain.  You develop a fever.  You have blood, pus, or other fluid coming from your ear.  You have hearing loss.  You have ringing in your ears that does not go away.  Your symptoms do not improve with treatment.  You feel like the room is spinning (vertigo). Summary  Earwax can build up in the ear and cause discomfort or hearing loss.  The most common symptoms of this condition include reduced or muffled hearing and a feeling of fullness in the ear or feeling that the ear is plugged.  This condition may be diagnosed based on your symptoms, your medical history, and an ear exam.  This condition may be treated by using ear drops to soften the earwax or by having the earwax removed by a health care provider.  Do   not put any objects, including cotton swabs, into your ear. You can clean the opening of your ear canal with a washcloth or facial tissue. This information is not intended to replace advice given to you by your health care provider. Make sure you discuss any questions you have with your health care provider. Document Released: 10/24/2004 Document Revised: 11/27/2016 Document Reviewed: 11/27/2016 Elsevier Interactive Patient Education  2018 [employer]  

## 2017-05-23 DIAGNOSIS — M5417 Radiculopathy, lumbosacral region: Secondary | ICD-10-CM | POA: Diagnosis not present

## 2017-05-26 ENCOUNTER — Other Ambulatory Visit: Payer: Self-pay

## 2017-05-26 DIAGNOSIS — I10 Essential (primary) hypertension: Secondary | ICD-10-CM

## 2017-05-26 MED ORDER — OLMESARTAN-AMLODIPINE-HCTZ 40-10-25 MG PO TABS: 1.0000 | ORAL_TABLET | Freq: Every day | ORAL | 1 refills | Status: DC

## 2017-05-26 NOTE — Telephone Encounter (Signed)
Patient requesting refill of Tribenzor 40-10-25 mg with a 90 day supply.

## 2017-06-04 ENCOUNTER — Other Ambulatory Visit: Payer: Self-pay | Admitting: Family Medicine

## 2017-06-04 DIAGNOSIS — E1143 Type 2 diabetes mellitus with diabetic autonomic (poly)neuropathy: Secondary | ICD-10-CM

## 2017-06-04 DIAGNOSIS — E1165 Type 2 diabetes mellitus with hyperglycemia: Principal | ICD-10-CM

## 2017-06-04 DIAGNOSIS — IMO0002 Reserved for concepts with insufficient information to code with codable children: Secondary | ICD-10-CM

## 2017-06-04 NOTE — Telephone Encounter (Signed)
Patient requesting refill of Reglan to [facility].

## 2017-06-12 DIAGNOSIS — E1165 Type 2 diabetes mellitus with hyperglycemia: Secondary | ICD-10-CM | POA: Diagnosis not present

## 2017-06-12 DIAGNOSIS — Z794 Long term (current) use of insulin: Secondary | ICD-10-CM | POA: Diagnosis not present

## 2017-06-12 LAB — BASIC METABOLIC PANEL: GLUCOSE: 344

## 2017-06-12 LAB — LIPID PANEL
Cholesterol: 189 (ref 0–200)
HDL: 51 (ref 35–70)
LDL CALC: 104
TRIGLYCERIDES: 171 — AB (ref 40–160)

## 2017-06-12 LAB — HEMOGLOBIN A1C: Hemoglobin A1C: 11

## 2017-06-16 ENCOUNTER — Ambulatory Visit: Payer: Medicare Other

## 2017-06-16 DIAGNOSIS — M5416 Radiculopathy, lumbar region: Secondary | ICD-10-CM | POA: Diagnosis not present

## 2017-06-16 DIAGNOSIS — M47817 Spondylosis without myelopathy or radiculopathy, lumbosacral region: Secondary | ICD-10-CM | POA: Diagnosis not present

## 2017-06-16 DIAGNOSIS — G894 Chronic pain syndrome: Secondary | ICD-10-CM | POA: Diagnosis not present

## 2017-06-16 DIAGNOSIS — M5136 Other intervertebral disc degeneration, lumbar region: Secondary | ICD-10-CM | POA: Diagnosis not present

## 2017-06-16 DIAGNOSIS — Z79891 Long term (current) use of opiate analgesic: Secondary | ICD-10-CM | POA: Diagnosis not present

## 2017-06-16 DIAGNOSIS — M545 Low back pain: Secondary | ICD-10-CM | POA: Diagnosis not present

## 2017-06-16 DIAGNOSIS — Z5181 Encounter for therapeutic drug level monitoring: Secondary | ICD-10-CM | POA: Diagnosis not present

## 2017-06-23 ENCOUNTER — Ambulatory Visit: Payer: Medicare Other

## 2017-06-25 ENCOUNTER — Ambulatory Visit: Payer: Medicare Other | Admitting: Family Medicine

## 2017-06-30 ENCOUNTER — Encounter: Payer: Self-pay | Admitting: Family Medicine

## 2017-06-30 ENCOUNTER — Ambulatory Visit (INDEPENDENT_AMBULATORY_CARE_PROVIDER_SITE_OTHER): Payer: Medicare Other | Admitting: Family Medicine

## 2017-06-30 VITALS — BP 102/58 | HR 90 | Temp 98.1°F | Resp 16 | Ht 61.0 in | Wt 176.1 lb

## 2017-06-30 DIAGNOSIS — E1169 Type 2 diabetes mellitus with other specified complication: Secondary | ICD-10-CM | POA: Diagnosis not present

## 2017-06-30 DIAGNOSIS — E785 Hyperlipidemia, unspecified: Secondary | ICD-10-CM

## 2017-06-30 DIAGNOSIS — E1165 Type 2 diabetes mellitus with hyperglycemia: Secondary | ICD-10-CM | POA: Diagnosis not present

## 2017-06-30 DIAGNOSIS — F331 Major depressive disorder, recurrent, moderate: Secondary | ICD-10-CM | POA: Diagnosis not present

## 2017-06-30 DIAGNOSIS — E1129 Type 2 diabetes mellitus with other diabetic kidney complication: Secondary | ICD-10-CM | POA: Diagnosis not present

## 2017-06-30 DIAGNOSIS — M5136 Other intervertebral disc degeneration, lumbar region: Secondary | ICD-10-CM | POA: Diagnosis not present

## 2017-06-30 DIAGNOSIS — Z23 Encounter for immunization: Secondary | ICD-10-CM | POA: Diagnosis not present

## 2017-06-30 DIAGNOSIS — M48062 Spinal stenosis, lumbar region with neurogenic claudication: Secondary | ICD-10-CM

## 2017-06-30 DIAGNOSIS — I10 Essential (primary) hypertension: Secondary | ICD-10-CM

## 2017-06-30 DIAGNOSIS — IMO0002 Reserved for concepts with insufficient information to code with codable children: Secondary | ICD-10-CM

## 2017-06-30 DIAGNOSIS — E1143 Type 2 diabetes mellitus with diabetic autonomic (poly)neuropathy: Secondary | ICD-10-CM | POA: Diagnosis not present

## 2017-06-30 DIAGNOSIS — R809 Proteinuria, unspecified: Secondary | ICD-10-CM

## 2017-06-30 MED ORDER — PRAVASTATIN SODIUM 40 MG PO TABS: 40.0000 mg | ORAL_TABLET | Freq: Every day | ORAL | 1 refills | Status: DC

## 2017-06-30 MED ORDER — CARVEDILOL 3.125 MG PO TABS: 3.1250 mg | ORAL_TABLET | Freq: Two times a day (BID) | ORAL | 1 refills | Status: DC

## 2017-06-30 MED ORDER — OLMESARTAN-AMLODIPINE-HCTZ 40-10-25 MG PO TABS: 1.0000 | ORAL_TABLET | Freq: Every day | ORAL | 1 refills | Status: DC

## 2017-06-30 MED ORDER — VENLAFAXINE HCL ER 150 MG PO CP24: ORAL_CAPSULE | ORAL | 1 refills | Status: DC

## 2017-06-30 NOTE — Progress Notes (Signed)
Name: Lorraine Jones   MRN: [id number]    DOB: 02-10-1951   Date:06/30/2017       Progress Note  Subjective  Chief Complaint  Chief Complaint  Patient presents with  . Medication Refill  . Hypertension  . Hyperlipidemia  . Pain  . Depression    HPI  DMII with neuropathy,dyslipidemia and gastroparesis: she is now on Metformin,  Lantus 50 units , and Humalog  24 units before meals She has been out of Trulicity because of cost. She was seeing Dr. Graceann Congress, but is going to see her replacement soon.   She states she has been compliant with her diet ( she has been trying to not have sweets) checking glucose more often, 3 times daily, fasting is still in 280's range in am's.  She denies polydipsia or polyphagia, but she has intermittent episodes of polyuria. She is taking statins and aspirin daily also on ARB. Neuropathy is worse lately, she was also found to have microalbuminuria on her last check at [facility]. She states indigestion is under control with Reglan before meals. Last lipid panel showed improvement of levels.   HTN: she needs refill of Tribenzor and carvedilol, she is compliant with medication and bp is at goal. No chest pain or palpitation. No recent leg edema. Today bp is low, and has been in the 120's during other office visit, we will go down on Coreg to 3.125 mg twice daily and monitor  Chronic Pain: she sees Dr. Primus Bravo at least once a month, taking pain medication as prescribed, she has constipation secondary to narcotics and is taking Miralax to control symptoms. Pain is getting worse, had ncs and advised to have a spinal stimulator, but she is afraid of complications.   Hyperlipidemia: taking Pravastatin and denies side effects, discussed switching to Atorvastatin but she is afraid of changing. She denies myalgias secondary to medication. No chest pain.   Osteopenia: she had a bone density back in 03/2016 , her FRAX score is 3.2 % for major osteoporotic fracture in  the next 10 years and 0.4 for hip . She is taking calcium plus D  Depression Mild: she has a long of major depression, part of it secondary to living in pain and inability to do things she likes. She states Effexor is helping, and also her daughter that keeps her motivated - she picks up to go out. She still has episodes of crying spells, no anhedonia, she denies suicidal thoughts or ideation. Father is in the nursing home  Patient Active Problem List   Diagnosis Date Noted  . Major depression, recurrent ([place]) 06/25/2016  . Osteopenia 04/18/2016  . Gastroesophageal reflux disease without esophagitis 05/12/2015  . Chronic pain 05/12/2015  . Hyperlipidemia 05/12/2015  . DDD (degenerative disc disease), lumbar 02/27/2015  . Lumbar post-laminectomy syndrome 02/27/2015  . Neuropathy due to secondary diabetes ([place]) 02/27/2015  . Spinal stenosis, lumbar region, with neurogenic claudication 02/27/2015  . Sacroiliac joint dysfunction of both sides 02/27/2015  . Degenerative joint disease (DJD) of hip 02/27/2015  . DDD (degenerative disc disease), cervical 02/27/2015  . Bilateral occipital neuralgia 02/27/2015  . Chronic constipation 06/02/2014  . Hyponatremia 06/01/2014  . Hypertension, benign 06/01/2014    Past Surgical History:  Procedure Laterality Date  . CHOLECYSTECTOMY  1996  . CYSTOSCOPY WITH URETEROSCOPY Right 09/01/2013   Procedure: CYSTOSCOPY WITH UNROOFING  RIGHT URETEROCELE AND URETERAL STONE REMOVED  WITH GYRUS COLLINS KNIFE. ;  Surgeon: Irine Seal, MD;  Location: [facility];  Service: Urology;  Laterality: Right;  cysto, right uretersoscopy and stone extraction   collins knife  . LUMBAR DISC SURGERY  1993   L4  --  L5    Family History  Problem Relation Age of Onset  . Diabetes Mother   . Heart disease Mother   . Hypertension Mother     Social History   Social History  . Marital status: Married    Spouse name: N/A  . Number of children: N/A  .  Years of education: N/A   Occupational History  . Not on file.   Social History Main Topics  . Smoking status: Current Every Day Smoker    Packs/day: 0.50    Years: 12.00    Types: Cigarettes  . Smokeless tobacco: Never Used  . Alcohol use No  . Drug use: No  . Sexual activity: Yes   Other Topics Concern  . Not on file   Social History Narrative  . No narrative on file     Current Outpatient Prescriptions:  .  aspirin EC 81 MG tablet, Take 81 mg by mouth daily., Disp: , Rfl:  .  baclofen (LIORESAL) 10 MG tablet, Take 1 tablet by mouth., Disp: , Rfl: 0 .  dorzolamide-timolol (COSOPT) 22.3-6.8 MG/ML ophthalmic solution, Place 1 drop into both eyes 2 (two) times daily., Disp: , Rfl:  .  fentaNYL (DURAGESIC - DOSED MCG/HR) 75 MCG/HR, Apply 2 patches to skin every 2 days if tolerated.   NOTE: Do not apply 100 g per hour fentanyl patch since insurance will not approve 100 g patch for you, Disp: 30 patch, Rfl: 0 .  fentaNYL (DURAGESIC - DOSED MCG/HR) 75 MCG/HR, Apply 2 patches to skin every 2 days if tolerated.   NOTE: Do not apply 100 g per hour fentanyl patch since insurance will not approve 100 g patch for you, Disp: 30 patch, Rfl: 0 .  glucose blood (ONE TOUCH ULTRA TEST) test strip, 1 strip by Other route 2 (two) times daily., Disp: , Rfl:  .  Insulin Glargine (LANTUS SOLOSTAR) 100 UNIT/ML Solostar Pen, Inject 25 Units into the skin 2 (two) times daily., Disp: , Rfl:  .  insulin lispro (HUMALOG) 100 UNIT/ML KiwkPen, Inject 20 Units into the skin 3 (three) times daily after meals., Disp: , Rfl:  .  metFORMIN (GLUCOPHAGE) 1000 MG tablet, Take 1 tablet (1,000 mg total) by mouth 2 (two) times daily with a meal., Disp: 180 tablet, Rfl: 1 .  metoCLOPramide (REGLAN) 5 MG tablet, TAKE 1 TABLET BY MOUTH 3 TIMES A DAY, Disp: 270 tablet, Rfl: 0 .  Multiple Vitamins-Minerals (MULTIVITAMIN WITH MINERALS) tablet, Take 1 tablet by mouth daily., Disp: , Rfl:  .  Oxycodone HCl 10 MG TABS, Limit 1  tab by mouth 4-8 times per day for breakthrough pain if tolerated while wearing fentanyl patches, Disp: 200 tablet, Rfl: 0 .  polyethylene glycol (MIRALAX / GLYCOLAX) packet, Take 17 g by mouth daily., Disp: 14 each, Rfl: 0 .  carvedilol (COREG) 3.125 MG tablet, Take 1 tablet (3.125 mg total) by mouth 2 (two) times daily., Disp: 180 tablet, Rfl: 1 .  Olmesartan-Amlodipine-HCTZ (TRIBENZOR) 40-10-25 MG TABS, Take 1 tablet by mouth daily., Disp: 90 tablet, Rfl: 1 .  pravastatin (PRAVACHOL) 40 MG tablet, Take 1 tablet (40 mg total) by mouth daily., Disp: 90 tablet, Rfl: 1 .  venlafaxine XR (EFFEXOR-XR) 150 MG 24 hr capsule, TAKE ONE CAPSULE BY MOUTH ONCE DAILY FOR  DEPRESSION, Disp: 90 capsule,  Rfl: 1  Allergies  Allergen Reactions  . Diclofenac Sodium Swelling  . Lodine [Etodolac] Swelling  . Naproxen Sodium Swelling  . Neurontin [Gabapentin] Swelling     ROS  Constitutional: Negative for fever or weight change.  Respiratory: Negative for cough and shortness of breath.   Cardiovascular: Negative for chest pain or palpitations.  Gastrointestinal: Negative for abdominal pain, no bowel changes.  Musculoskeletal: Positive for gait problem but no  joint swelling.  Skin: Negative for rash.  Neurological: Negative for dizziness or headache.  No other specific complaints in a complete review of systems (except as listed in HPI above).  Objective  Vitals:   06/30/17 1052  BP: (!) 102/58  Pulse: 90  Resp: 16  Temp: 98.1 F (36.7 C)  TempSrc: Oral  SpO2: 96%  Weight: 176 lb 1.6 oz (79.9 kg)  Height: 5\' 1"  (1.549 m)    Body mass index is 33.27 kg/m.  Physical Exam  Constitutional: Patient appears well-developed and well-nourished. Obese  No distress.  HEENT: head atraumatic, normocephalic, pupils equal and reactive to light, eneck supple, throat within normal limits Cardiovascular: Normal rate, regular rhythm and normal heart sounds.  No murmur heard. No BLE edema. Pulmonary/Chest:  Effort normal and breath sounds normal. No respiratory distress. Abdominal: Soft.  There is no tenderness. Psychiatric: Patient has a normal mood and affect. behavior is normal. Judgment and thought content normal.   Diabetic Foot Exam: Diabetic Foot Exam - Simple   Simple Foot Form Diabetic Foot exam was performed with the following findings:  Yes 06/30/2017 11:41 AM  Visual Inspection See comments:  Yes Sensation Testing Intact to touch and monofilament testing bilaterally:  Yes Pulse Check Posterior Tibialis and Dorsalis pulse intact bilaterally:  Yes Comments Thick and long toe nails.      PHQ2/9: Depression screen [facility] 2/9 06/30/2017 05/16/2017 04/04/2017 12/23/2016 06/25/2016  Decreased Interest 0 0 3 0 0  Down, Depressed, Hopeless 0 0 1 0 0  PHQ - 2 Score 0 0 4 0 0  Altered sleeping - - 3 - -  Tired, decreased energy - - 3 - -  Change in appetite - - 3 - -  Feeling bad or failure about yourself  - - 0 - -  Trouble concentrating - - 0 - -  Moving slowly or fidgety/restless - - 1 - -  Suicidal thoughts - - 0 - -  PHQ-9 Score - - 14 - -  Difficult doing work/chores - - Somewhat difficult - -     Fall Risk: Fall Risk  06/30/2017 05/16/2017 04/04/2017 12/23/2016 06/25/2016  Falls in the past year? No No No No No  Comment - - - - -  Risk for fall due to : - - - - -     Functional Status Survey: Is the patient deaf or have difficulty hearing?: No Does the patient have difficulty seeing, even when wearing glasses/contacts?: Yes Does the patient have difficulty concentrating, remembering, or making decisions?: No Does the patient have difficulty walking or climbing stairs?: Yes Does the patient have difficulty dressing or bathing?: No Does the patient have difficulty doing errands alone such as visiting a doctor's office or shopping?: No   Assessment & Plan  1. Hypertension, benign  - carvedilol (COREG) 3.125 MG tablet; Take 1 tablet (3.125 mg total) by mouth 2 (two) times  daily.  Dispense: 180 tablet; Refill: 1 - Olmesartan-Amlodipine-HCTZ (TRIBENZOR) 40-10-25 MG TABS; Take 1 tablet by mouth daily.  Dispense: 90 tablet; Refill:  1  2. 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 Dyslipidemia associated with type 2 diabetes mellitus (HCC)  - pravastatin (PRAVACHOL) 40 MG tablet; Take 1 tablet (40 mg total) by mouth daily.  Dispense: 90 tablet; Refill: 1  4. Moderate episode of recurrent major depressive disorder (HCC)  - venlafaxine XR (EFFEXOR-XR) 150 MG 24 hr capsule; TAKE ONE CAPSULE BY MOUTH ONCE DAILY FOR  DEPRESSION  Dispense: 90 capsule; Refill: 1  5. Flu vaccine need  - Flu vaccine HIGH DOSE PF  6. DDD (degenerative disc disease), lumbar  continue follow up with pain clinic   7. Spinal stenosis, lumbar region, with neurogenic claudication  Getting worse and pain clinic wants to place a spinal stimulator  8. Diabetes mellitus with microalbuminuria (HCC)  On ARB

## 2017-07-02 ENCOUNTER — Ambulatory Visit: Payer: Medicare Other | Admitting: Student in an Organized Health Care Education/Training Program

## 2017-07-21 ENCOUNTER — Ambulatory Visit: Payer: Medicare Other

## 2017-07-22 ENCOUNTER — Ambulatory Visit: Payer: Medicare Other

## 2017-07-23 DIAGNOSIS — I152 Hypertension secondary to endocrine disorders: Secondary | ICD-10-CM | POA: Insufficient documentation

## 2017-07-23 DIAGNOSIS — Z794 Long term (current) use of insulin: Secondary | ICD-10-CM | POA: Diagnosis not present

## 2017-07-23 DIAGNOSIS — I10 Essential (primary) hypertension: Secondary | ICD-10-CM

## 2017-07-23 DIAGNOSIS — E1169 Type 2 diabetes mellitus with other specified complication: Secondary | ICD-10-CM | POA: Insufficient documentation

## 2017-07-23 DIAGNOSIS — E1165 Type 2 diabetes mellitus with hyperglycemia: Secondary | ICD-10-CM | POA: Diagnosis not present

## 2017-07-23 DIAGNOSIS — E1159 Type 2 diabetes mellitus with other circulatory complications: Secondary | ICD-10-CM | POA: Diagnosis not present

## 2017-07-23 DIAGNOSIS — E785 Hyperlipidemia, unspecified: Secondary | ICD-10-CM | POA: Diagnosis not present

## 2017-08-06 DIAGNOSIS — M545 Low back pain: Secondary | ICD-10-CM | POA: Diagnosis not present

## 2017-08-06 DIAGNOSIS — Z5181 Encounter for therapeutic drug level monitoring: Secondary | ICD-10-CM | POA: Diagnosis not present

## 2017-08-06 DIAGNOSIS — M5136 Other intervertebral disc degeneration, lumbar region: Secondary | ICD-10-CM | POA: Diagnosis not present

## 2017-08-06 DIAGNOSIS — M259 Joint disorder, unspecified: Secondary | ICD-10-CM | POA: Diagnosis not present

## 2017-08-07 ENCOUNTER — Ambulatory Visit: Payer: Medicare Other | Admitting: Student in an Organized Health Care Education/Training Program

## 2017-08-07 DIAGNOSIS — H40023 Open angle with borderline findings, high risk, bilateral: Secondary | ICD-10-CM | POA: Diagnosis not present

## 2017-08-07 DIAGNOSIS — H40053 Ocular hypertension, bilateral: Secondary | ICD-10-CM | POA: Diagnosis not present

## 2017-08-12 ENCOUNTER — Encounter: Payer: Self-pay | Admitting: General Surgery

## 2017-08-13 ENCOUNTER — Encounter: Payer: Self-pay | Admitting: General Surgery

## 2017-08-15 ENCOUNTER — Encounter: Payer: Self-pay | Admitting: General Surgery

## 2017-08-28 ENCOUNTER — Other Ambulatory Visit: Payer: Self-pay

## 2017-08-28 NOTE — Telephone Encounter (Signed)
Refill request for general medication: Metoclopramide 5 mg  Last office visit: 06/30/2017  Next visit: 12/29/2017

## 2017-08-29 ENCOUNTER — Other Ambulatory Visit: Payer: Self-pay

## 2017-08-29 ENCOUNTER — Other Ambulatory Visit: Payer: Self-pay | Admitting: Family Medicine

## 2017-08-29 DIAGNOSIS — IMO0002 Reserved for concepts with insufficient information to code with codable children: Secondary | ICD-10-CM

## 2017-08-29 DIAGNOSIS — E1143 Type 2 diabetes mellitus with diabetic autonomic (poly)neuropathy: Secondary | ICD-10-CM

## 2017-08-29 DIAGNOSIS — E1165 Type 2 diabetes mellitus with hyperglycemia: Principal | ICD-10-CM

## 2017-08-29 MED ORDER — METOCLOPRAMIDE HCL 5 MG PO TABS: 5.0000 mg | ORAL_TABLET | Freq: Three times a day (TID) | ORAL | 0 refills | Status: DC

## 2017-08-29 NOTE — Telephone Encounter (Signed)
Refill request for general medication: Metoclopramide 5 mg  Last office visit: 06/30/2017  Last physical exam: 12/06/2015  Follow up visit: 12/29/2017

## 2017-09-03 DIAGNOSIS — Z5181 Encounter for therapeutic drug level monitoring: Secondary | ICD-10-CM | POA: Diagnosis not present

## 2017-09-03 DIAGNOSIS — M5416 Radiculopathy, lumbar region: Secondary | ICD-10-CM | POA: Diagnosis not present

## 2017-09-03 DIAGNOSIS — M5136 Other intervertebral disc degeneration, lumbar region: Secondary | ICD-10-CM | POA: Diagnosis not present

## 2017-09-03 DIAGNOSIS — M545 Low back pain: Secondary | ICD-10-CM | POA: Diagnosis not present

## 2017-10-03 ENCOUNTER — Other Ambulatory Visit: Payer: Self-pay | Admitting: Family Medicine

## 2017-10-03 DIAGNOSIS — E785 Hyperlipidemia, unspecified: Secondary | ICD-10-CM

## 2017-10-14 DIAGNOSIS — Z5181 Encounter for therapeutic drug level monitoring: Secondary | ICD-10-CM | POA: Diagnosis not present

## 2017-10-14 DIAGNOSIS — M5136 Other intervertebral disc degeneration, lumbar region: Secondary | ICD-10-CM | POA: Diagnosis not present

## 2017-10-14 DIAGNOSIS — M5441 Lumbago with sciatica, right side: Secondary | ICD-10-CM | POA: Diagnosis not present

## 2017-10-14 DIAGNOSIS — M259 Joint disorder, unspecified: Secondary | ICD-10-CM | POA: Diagnosis not present

## 2017-11-11 DIAGNOSIS — M5416 Radiculopathy, lumbar region: Secondary | ICD-10-CM | POA: Diagnosis not present

## 2017-11-11 DIAGNOSIS — M5136 Other intervertebral disc degeneration, lumbar region: Secondary | ICD-10-CM | POA: Diagnosis not present

## 2017-11-11 DIAGNOSIS — M5441 Lumbago with sciatica, right side: Secondary | ICD-10-CM | POA: Diagnosis not present

## 2017-11-11 DIAGNOSIS — Z5181 Encounter for therapeutic drug level monitoring: Secondary | ICD-10-CM | POA: Diagnosis not present

## 2017-12-16 DIAGNOSIS — M5416 Radiculopathy, lumbar region: Secondary | ICD-10-CM | POA: Diagnosis not present

## 2017-12-16 DIAGNOSIS — Z5181 Encounter for therapeutic drug level monitoring: Secondary | ICD-10-CM | POA: Diagnosis not present

## 2017-12-16 DIAGNOSIS — M5136 Other intervertebral disc degeneration, lumbar region: Secondary | ICD-10-CM | POA: Diagnosis not present

## 2017-12-16 DIAGNOSIS — M5441 Lumbago with sciatica, right side: Secondary | ICD-10-CM | POA: Diagnosis not present

## 2017-12-29 ENCOUNTER — Ambulatory Visit: Payer: Medicare Other | Admitting: Family Medicine

## 2017-12-31 DIAGNOSIS — E1159 Type 2 diabetes mellitus with other circulatory complications: Secondary | ICD-10-CM | POA: Diagnosis not present

## 2017-12-31 DIAGNOSIS — E1165 Type 2 diabetes mellitus with hyperglycemia: Secondary | ICD-10-CM | POA: Diagnosis not present

## 2017-12-31 DIAGNOSIS — Z794 Long term (current) use of insulin: Secondary | ICD-10-CM | POA: Diagnosis not present

## 2017-12-31 DIAGNOSIS — E1169 Type 2 diabetes mellitus with other specified complication: Secondary | ICD-10-CM | POA: Diagnosis not present

## 2017-12-31 DIAGNOSIS — I10 Essential (primary) hypertension: Secondary | ICD-10-CM | POA: Diagnosis not present

## 2018-01-13 DIAGNOSIS — M5441 Lumbago with sciatica, right side: Secondary | ICD-10-CM | POA: Diagnosis not present

## 2018-01-13 DIAGNOSIS — Z5181 Encounter for therapeutic drug level monitoring: Secondary | ICD-10-CM | POA: Diagnosis not present

## 2018-01-13 DIAGNOSIS — M5136 Other intervertebral disc degeneration, lumbar region: Secondary | ICD-10-CM | POA: Diagnosis not present

## 2018-01-13 DIAGNOSIS — M5416 Radiculopathy, lumbar region: Secondary | ICD-10-CM | POA: Diagnosis not present

## 2018-02-06 DIAGNOSIS — M160 Bilateral primary osteoarthritis of hip: Secondary | ICD-10-CM | POA: Diagnosis not present

## 2018-02-06 DIAGNOSIS — M25551 Pain in right hip: Secondary | ICD-10-CM | POA: Diagnosis not present

## 2018-02-06 DIAGNOSIS — M5137 Other intervertebral disc degeneration, lumbosacral region: Secondary | ICD-10-CM | POA: Diagnosis not present

## 2018-02-06 DIAGNOSIS — M5136 Other intervertebral disc degeneration, lumbar region: Secondary | ICD-10-CM | POA: Diagnosis not present

## 2018-02-10 DIAGNOSIS — Z5181 Encounter for therapeutic drug level monitoring: Secondary | ICD-10-CM | POA: Diagnosis not present

## 2018-02-10 DIAGNOSIS — M5416 Radiculopathy, lumbar region: Secondary | ICD-10-CM | POA: Diagnosis not present

## 2018-02-10 DIAGNOSIS — M5136 Other intervertebral disc degeneration, lumbar region: Secondary | ICD-10-CM | POA: Diagnosis not present

## 2018-02-10 DIAGNOSIS — M5441 Lumbago with sciatica, right side: Secondary | ICD-10-CM | POA: Diagnosis not present

## 2018-03-02 ENCOUNTER — Encounter: Payer: Self-pay | Admitting: Nurse Practitioner

## 2018-03-02 ENCOUNTER — Ambulatory Visit (INDEPENDENT_AMBULATORY_CARE_PROVIDER_SITE_OTHER): Payer: Medicare Other | Admitting: Nurse Practitioner

## 2018-03-02 VITALS — BP 118/80 | HR 100 | Temp 98.3°F | Resp 16 | Ht 61.0 in | Wt 184.1 lb

## 2018-03-02 DIAGNOSIS — E1340 Other specified diabetes mellitus with diabetic neuropathy, unspecified: Secondary | ICD-10-CM

## 2018-03-02 DIAGNOSIS — R519 Headache, unspecified: Secondary | ICD-10-CM

## 2018-03-02 DIAGNOSIS — R51 Headache: Secondary | ICD-10-CM

## 2018-03-02 DIAGNOSIS — Z720 Tobacco use: Secondary | ICD-10-CM

## 2018-03-02 LAB — POCT GLYCOSYLATED HEMOGLOBIN (HGB A1C): HEMOGLOBIN A1C: 9.8 % — AB (ref 4.0–5.6)

## 2018-03-02 LAB — GLUCOSE, POCT (MANUAL RESULT ENTRY): POC Glucose: 181 mg/dl — AB (ref 70–99)

## 2018-03-02 MED ORDER — BLOOD GLUCOSE MONITOR KIT: PACK | 0 refills | Status: DC

## 2018-03-02 NOTE — Patient Instructions (Addendum)
Work on cutting down your smoking from one pack a day to half a pack a day over the next 2 weeks and cut down further and further till you are no longer smoking. If you would like to try medication assistance please let us know. There is also nicotine replacement options such as nicotine gum that will help you as well.  For pain: never exceed more than 3,000mg  of tylenol a day as this can be harmful to your liver and lead to death.  For your headache: - Goal is 64 ounces of water a day - Stop smoking - Stop taking tylenol - [facility] - Follow-up with neurology.

## 2018-03-02 NOTE — Progress Notes (Addendum)
Name: Lorraine Jones   MRN: [id number]    DOB: 1951/01/11   Date:03/02/2018       Progress Note  Subjective  Chief Complaint  Chief Complaint  Patient presents with  . Headache    for 2 weeks    HPI  Patient endorses left sided frontal headache ongoing for 2 weeks. States has never had this before but feels like some one is pounding on her head with a sledge hammer. Patient states tylenol relieves it for about 4 hours and then the pain comes back worse. Patient states eyes get teary when headache is going on. Denies nausea, vomiting, light sensitivity, sound sensitivity, dizziness or lightheadedness, blurry vision, slurred speech or weakness.   Denies Alcohol,exposure to high altitudes, not out in the son, denies foods high in nitrites. Drinks half caffeine coffee 1-2 cups daily and a soda now and then.   Does smoke 1 ppd, states she drinks plenty of water.    hasn't been able to check blood sugars this past month due to broken machine.   Patient Active Problem List   Diagnosis Date Noted  . Major depression, recurrent ([place]) 06/25/2016  . Osteopenia 04/18/2016  . Gastroesophageal reflux disease without esophagitis 05/12/2015  . Chronic pain 05/12/2015  . Hyperlipidemia 05/12/2015  . DDD (degenerative disc disease), lumbar 02/27/2015  . Lumbar post-laminectomy syndrome 02/27/2015  . Neuropathy due to secondary diabetes ([place]) 02/27/2015  . Spinal stenosis, lumbar region, with neurogenic claudication 02/27/2015  . Sacroiliac joint dysfunction of both sides 02/27/2015  . Degenerative joint disease (DJD) of hip 02/27/2015  . DDD (degenerative disc disease), cervical 02/27/2015  . Bilateral occipital neuralgia 02/27/2015  . Chronic constipation 06/02/2014  . Hyponatremia 06/01/2014  . Hypertension, benign 06/01/2014    Past Medical History:  Diagnosis Date  . Arthritis   . Chronic low back pain   . Depression   . Diabetes mellitus   . Hyperlipidemia   . Hypertension      Past Surgical History:  Procedure Laterality Date  . CHOLECYSTECTOMY  1996  . CYSTOSCOPY WITH URETEROSCOPY Right 09/01/2013   Procedure: CYSTOSCOPY WITH UNROOFING  RIGHT URETEROCELE AND URETERAL STONE REMOVED  WITH GYRUS COLLINS KNIFE. ;  Surgeon: Irine Seal, MD;  Location: [facility];  Service: Urology;  Laterality: Right;  cysto, right uretersoscopy and stone extraction   collins knife  . LUMBAR DISC SURGERY  1993   L4  --  L5    Social History   Tobacco Use  . Smoking status: Current Every Day Smoker    Packs/day: 0.50    Years: 12.00    Pack years: 6.00    Types: Cigarettes  . Smokeless tobacco: Never Used  Substance Use Topics  . Alcohol use: No    Alcohol/week: 0.0 oz     Current Outpatient Medications:  .  aspirin EC 81 MG tablet, Take 81 mg by mouth daily., Disp: , Rfl:  .  baclofen (LIORESAL) 10 MG tablet, Take 1 tablet by mouth., Disp: , Rfl: 0 .  carvedilol (COREG) 3.125 MG tablet, Take 1 tablet (3.125 mg total) by mouth 2 (two) times daily., Disp: 180 tablet, Rfl: 1 .  dorzolamide-timolol (COSOPT) 22.3-6.8 MG/ML ophthalmic solution, Place 1 drop into both eyes 2 (two) times daily., Disp: , Rfl:  .  fentaNYL (DURAGESIC - DOSED MCG/HR) 75 MCG/HR, Apply 2 patches to skin every 2 days if tolerated.   NOTE: Do not apply 100 g per hour fentanyl patch since insurance will  not approve 100 g patch for you, Disp: 30 patch, Rfl: 0 .  fentaNYL (DURAGESIC - DOSED MCG/HR) 75 MCG/HR, Apply 2 patches to skin every 2 days if tolerated.   NOTE: Do not apply 100 g per hour fentanyl patch since insurance will not approve 100 g patch for you, Disp: 30 patch, Rfl: 0 .  glucose blood (ONE TOUCH ULTRA TEST) test strip, 1 strip by Other route 2 (two) times daily., Disp: , Rfl:  .  Insulin Glargine (LANTUS SOLOSTAR) 100 UNIT/ML Solostar Pen, Inject 25 Units into the skin 2 (two) times daily., Disp: , Rfl:  .  insulin lispro (HUMALOG) 100 UNIT/ML KiwkPen, Inject 20 Units  into the skin 3 (three) times daily after meals., Disp: , Rfl:  .  metFORMIN (GLUCOPHAGE) 1000 MG tablet, Take 1 tablet (1,000 mg total) by mouth 2 (two) times daily with a meal., Disp: 180 tablet, Rfl: 1 .  metoCLOPramide (REGLAN) 5 MG tablet, Take 1 tablet (5 mg total) by mouth 3 (three) times daily., Disp: 270 tablet, Rfl: 0 .  Multiple Vitamins-Minerals (MULTIVITAMIN WITH MINERALS) tablet, Take 1 tablet by mouth daily., Disp: , Rfl:  .  Olmesartan-Amlodipine-HCTZ (TRIBENZOR) 40-10-25 MG TABS, Take 1 tablet by mouth daily., Disp: 90 tablet, Rfl: 1 .  Oxycodone HCl 10 MG TABS, Limit 1 tab by mouth 4-8 times per day for breakthrough pain if tolerated while wearing fentanyl patches, Disp: 200 tablet, Rfl: 0 .  polyethylene glycol (MIRALAX / GLYCOLAX) packet, Take 17 g by mouth daily., Disp: 14 each, Rfl: 0 .  pravastatin (PRAVACHOL) 40 MG tablet, TAKE 1 TABLET (40 MG TOTAL) BY MOUTH DAILY., Disp: 90 tablet, Rfl: 1 .  venlafaxine XR (EFFEXOR-XR) 150 MG 24 hr capsule, TAKE ONE CAPSULE BY MOUTH ONCE DAILY FOR  DEPRESSION, Disp: 90 capsule, Rfl: 1  Allergies  Allergen Reactions  . Diclofenac Sodium Swelling  . Lodine [Etodolac] Swelling  . Naproxen Sodium Swelling  . Neurontin [Gabapentin] Swelling    ROS  No other specific complaints in a complete review of systems (except as listed in HPI above).  Objective  Vitals:   03/02/18 1449  BP: 118/80  Pulse: 100  Resp: 16  Temp: 98.3 F (36.8 C)  TempSrc: Oral  SpO2: 94%  Weight: 184 lb 1.6 oz (83.5 kg)  Height: _0  (1.549 m)    Body mass index is 34.79 kg/m.  Nursing Note and Vital Signs reviewed.  Physical Exam   Constitutional: Patient appears well-developed and well-nourished. Obese. No distress.  HEENT: head atraumatic, normocephalic, No pulsating masses noted at temporal region or tenderness so head, pupils equal and reactive to light, TM's without erythema or bulging,  neck supple without lymphadenopathy, oropharynx pink  and moist without exudate, no nasal discharge Cardiovascular: Normal rate, regular rhythm, S1/S2 present.   Pulmonary/Chest: Effort normal and breath sounds clear. Neuro: alert and oriented x3, no nystagmus, no facial droop, upper and lower limb strength equal, sensation intact Psychiatric: Patient has a normal mood and affect. behavior is normal. Judgment and thought content normal.  No results found for this or any previous visit (from the past 72 hour(s)).  Assessment & Plan  1. Acute intractable headache, unspecified headache type For your headache: - Goal is 64 ounces of water a day - Stop smoking - Stop taking tylenol - POCT Glucose (CBG) - Ambulatory referral to Neurology - CT Head W Contrast; Future  2. Neuropathy due to secondary diabetes ([place]) One week follow-up to discuss uncontrolled diabetes. Please  bring in new log of sugars. Machine broken- order for new one given  - POCT HgB A1C - POCT Glucose (CBG) - blood glucose meter kit and supplies KIT; Dispense based on patient and insurance preference. Use up to four times daily as directed. (FOR ICD-9 250.00, 250.01).  Dispense: 1 each; Refill: 0  3. Tobacco use Discussed cessation    -Red flags and when to present for emergency care or RTC including fever >101.25F, chest pain, shortness of breath, new/worsening/un-resolving symptoms, weakness, slurred speech, blurry vision, off-balance, reviewed with patient at time of visit. Follow up and care instructions discussed and provided in AVS. -Reviewed Health Maintenance:   -------------------------------------------------- I have reviewed this encounter including the documentation in this note and/or discussed this patient with the provider, Suezanne Cheshire DNP AGNP-C. I am certifying that I agree with the content of this note as supervising physician. Enid Derry, [facility] 03/06/2018, 6:25 PM

## 2018-03-03 ENCOUNTER — Ambulatory Visit
Admission: RE | Admit: 2018-03-03 | Discharge: 2018-03-03 | Disposition: A | Discharge status: Home / Self Care | Payer: Medicare Other | Source: Ambulatory Visit | Attending: Nurse Practitioner | Admitting: Nurse Practitioner

## 2018-03-03 ENCOUNTER — Telehealth: Payer: Self-pay | Admitting: Family Medicine

## 2018-03-03 DIAGNOSIS — R51 Headache: Secondary | ICD-10-CM | POA: Insufficient documentation

## 2018-03-03 DIAGNOSIS — R519 Headache, unspecified: Secondary | ICD-10-CM

## 2018-03-03 NOTE — Addendum Note (Signed)
Addended by: Saunders Glance A on: 03/03/2018 09:41 AM   Modules accepted: Orders

## 2018-03-03 NOTE — Telephone Encounter (Signed)
Copied from [place] [id number]. Topic: Quick Communication - See Telephone Encounter >> Mar 03, 2018 11:31 AM Lorraine Jones wrote: CRM for notification. See Telephone encounter for: 03/03/18.  Patient said that she was in the office yesterday and that Dr Ancil Boozer was going to send over a script for her to get a new " one touch " meter. Please advise

## 2018-03-04 NOTE — Telephone Encounter (Signed)
Spoke to patient. Prescription was sent to [facility] instead of [facility]. She will contact [facility] to obtain meter.

## 2018-03-10 ENCOUNTER — Encounter

## 2018-03-10 ENCOUNTER — Ambulatory Visit (INDEPENDENT_AMBULATORY_CARE_PROVIDER_SITE_OTHER): Payer: Medicare Other | Admitting: Family Medicine

## 2018-03-10 ENCOUNTER — Encounter: Payer: Self-pay | Admitting: Family Medicine

## 2018-03-10 VITALS — BP 120/60 | HR 100 | Resp 16 | Ht 61.0 in | Wt 185.0 lb

## 2018-03-10 DIAGNOSIS — Z720 Tobacco use: Secondary | ICD-10-CM | POA: Diagnosis not present

## 2018-03-10 DIAGNOSIS — E1169 Type 2 diabetes mellitus with other specified complication: Secondary | ICD-10-CM

## 2018-03-10 DIAGNOSIS — E1143 Type 2 diabetes mellitus with diabetic autonomic (poly)neuropathy: Secondary | ICD-10-CM | POA: Diagnosis not present

## 2018-03-10 DIAGNOSIS — R809 Proteinuria, unspecified: Secondary | ICD-10-CM

## 2018-03-10 DIAGNOSIS — R519 Headache, unspecified: Secondary | ICD-10-CM

## 2018-03-10 DIAGNOSIS — IMO0002 Reserved for concepts with insufficient information to code with codable children: Secondary | ICD-10-CM

## 2018-03-10 DIAGNOSIS — E1159 Type 2 diabetes mellitus with other circulatory complications: Secondary | ICD-10-CM

## 2018-03-10 DIAGNOSIS — E1129 Type 2 diabetes mellitus with other diabetic kidney complication: Secondary | ICD-10-CM | POA: Diagnosis not present

## 2018-03-10 DIAGNOSIS — E785 Hyperlipidemia, unspecified: Secondary | ICD-10-CM | POA: Diagnosis not present

## 2018-03-10 DIAGNOSIS — R51 Headache: Secondary | ICD-10-CM | POA: Diagnosis not present

## 2018-03-10 DIAGNOSIS — I152 Hypertension secondary to endocrine disorders: Secondary | ICD-10-CM

## 2018-03-10 DIAGNOSIS — F331 Major depressive disorder, recurrent, moderate: Secondary | ICD-10-CM

## 2018-03-10 DIAGNOSIS — E1165 Type 2 diabetes mellitus with hyperglycemia: Secondary | ICD-10-CM

## 2018-03-10 DIAGNOSIS — F339 Major depressive disorder, recurrent, unspecified: Secondary | ICD-10-CM

## 2018-03-10 DIAGNOSIS — E1340 Other specified diabetes mellitus with diabetic neuropathy, unspecified: Secondary | ICD-10-CM

## 2018-03-10 DIAGNOSIS — I10 Essential (primary) hypertension: Secondary | ICD-10-CM | POA: Diagnosis not present

## 2018-03-10 DIAGNOSIS — G894 Chronic pain syndrome: Secondary | ICD-10-CM | POA: Diagnosis not present

## 2018-03-10 MED ORDER — CARVEDILOL 3.125 MG PO TABS: 3.1250 mg | ORAL_TABLET | Freq: Two times a day (BID) | ORAL | 1 refills | Status: DC

## 2018-03-10 MED ORDER — OLMESARTAN-AMLODIPINE-HCTZ 40-10-25 MG PO TABS: 1.0000 | ORAL_TABLET | Freq: Every day | ORAL | 1 refills | Status: DC

## 2018-03-10 MED ORDER — PRAVASTATIN SODIUM 40 MG PO TABS: 40.0000 mg | ORAL_TABLET | Freq: Every day | ORAL | 1 refills | Status: DC

## 2018-03-10 MED ORDER — METOCLOPRAMIDE HCL 5 MG PO TABS: 5.0000 mg | ORAL_TABLET | Freq: Three times a day (TID) | ORAL | 0 refills | Status: DC

## 2018-03-10 MED ORDER — VENLAFAXINE HCL ER 150 MG PO CP24: ORAL_CAPSULE | ORAL | 1 refills | Status: DC

## 2018-03-10 NOTE — Progress Notes (Signed)
Name: Lorraine Jones   MRN: [id number]    DOB: 1950-11-12   Date:03/10/2018       Progress Note  Subjective  Chief Complaint  Chief Complaint  Patient presents with  . Depression  . Diabetes  . Hypertension  . Headache    HPI  DMII with neuropathy,dyslipidemia and gastroparesis, microalbuminuria: she is now on Metformin and insulin  (not sure of the name) R and N types Seeing Dr. Manfred Shirts  She states she has been compliant with her diet ( she has been trying to not have sweets so she can have hip surgery)She denies polydipsia or polyphagia, but she has intermittent episodes of polyuria, she has intermittent symptoms of neuropathy, described as aching like.  She is taking statins and aspirin daily also on ARB, afraid to change to high dose statin therapy. She states indigestion is under control with Reglan before meals. Last lipid panel done at [facility] and reviewed today   HTN in DM: she needs refill of Tribenzor and carvedilol, she is compliant with medication and bp is at goal. No chest pain or palpitation. Denies lower extremity edema.Doing well on current regiment. Reminded her to stop smoking   Chronic Pain: she sees Dr. Primus Bravo at least once a month, taking pain medication as prescribed, she has constipation secondary to narcotics and is taking Miralax otc to control symptoms. She is now seeing Ortho and will have hip replacement . Pain on her back today is 4/10   Hyperlipidemia: taking Pravastatin and denies side effects, discussed switching to Atorvastatin but she is afraid of changing. She denies myalgias secondary to medication. No chest pain. Unchanged   Osteopenia: she had a bone density back in 03/2016 , her FRAX score is 3.2 % for major osteoporotic fracture in the next 10 years and 0.4 for hip . She is taking calcium plus D, we will recheck bone density in a few years because of cost  Depression moderate and recurrent: she has a long of major depression, part of it  secondary to living in pain and inability to do things she likes. She states Effexor but not feeling very well today. Phq9 is high, willing to see psychiatrist and therapist if needed    Patient Active Problem List   Diagnosis Date Noted  . Primary osteoarthritis of both hips 02/06/2018  . Hypertension associated with diabetes ([place]) 07/23/2017  . Hyperlipidemia due to type 2 diabetes mellitus ([place]) 07/23/2017  . Major depression, recurrent ([place]) 06/25/2016  . Osteopenia 04/18/2016  . Gastroesophageal reflux disease without esophagitis 05/12/2015  . Chronic pain 05/12/2015  . Hyperlipidemia 05/12/2015  . Lumbar post-laminectomy syndrome 02/27/2015  . Neuropathy due to secondary diabetes ([place]) 02/27/2015  . Spinal stenosis, lumbar region, with neurogenic claudication 02/27/2015  . Sacroiliac joint dysfunction of both sides 02/27/2015  . Degenerative joint disease (DJD) of hip 02/27/2015  . DDD (degenerative disc disease), cervical 02/27/2015  . Bilateral occipital neuralgia 02/27/2015  . Chronic constipation 06/02/2014  . Hyponatremia 06/01/2014  . Hypertension, benign 06/01/2014    Past Surgical History:  Procedure Laterality Date  . CHOLECYSTECTOMY  1996  . CYSTOSCOPY WITH URETEROSCOPY Right 09/01/2013   Procedure: CYSTOSCOPY WITH UNROOFING  RIGHT URETEROCELE AND URETERAL STONE REMOVED  WITH GYRUS COLLINS KNIFE. ;  Surgeon: Irine Seal, MD;  Location: [facility];  Service: Urology;  Laterality: Right;  cysto, right uretersoscopy and stone extraction   collins knife  . LUMBAR DISC SURGERY  1993   L4  --  L5    Family History  Problem Relation Age of Onset  . Diabetes Mother   . Heart disease Mother   . Hypertension Mother     Social History   Socioeconomic History  . Marital status: Married    Spouse name: Not on file  . Number of children: Not on file  . Years of education: Not on file  . Highest education level: Not on file  Occupational History  .  Not on file  Social Needs  . Financial resource strain: Not on file  . Food insecurity:    Worry: Not on file    Inability: Not on file  . Transportation needs:    Medical: Not on file    Non-medical: Not on file  Tobacco Use  . Smoking status: Current Every Day Smoker    Packs/day: 0.50    Years: 12.00    Pack years: 6.00    Types: Cigarettes  . Smokeless tobacco: Never Used  Substance and Sexual Activity  . Alcohol use: No    Alcohol/week: 0.0 oz  . Drug use: No  . Sexual activity: Yes  Lifestyle  . Physical activity:    Days per week: Not on file    Minutes per session: Not on file  . Stress: Not on file  Relationships  . Social connections:    Talks on phone: Not on file    Gets together: Not on file    Attends religious service: Not on file    Active member of club or organization: Not on file    Attends meetings of clubs or organizations: Not on file    Relationship status: Not on file  . Intimate partner violence:    Fear of current or ex partner: Not on file    Emotionally abused: Not on file    Physically abused: Not on file    Forced sexual activity: Not on file  Other Topics Concern  . Not on file  Social History Narrative  . Not on file     Current Outpatient Medications:  .  aspirin EC 81 MG tablet, Take 81 mg by mouth daily., Disp: , Rfl:  .  blood glucose meter kit and supplies KIT, Dispense based on patient and insurance preference. Use up to four times daily as directed. (FOR ICD-9 250.00, 250.01)., Disp: 1 each, Rfl: 0 .  carvedilol (COREG) 3.125 MG tablet, Take 1 tablet (3.125 mg total) by mouth 2 (two) times daily., Disp: 180 tablet, Rfl: 1 .  dorzolamide-timolol (COSOPT) 22.3-6.8 MG/ML ophthalmic solution, Place 1 drop into both eyes 2 (two) times daily., Disp: , Rfl:  .  fentaNYL (DURAGESIC - DOSED MCG/HR) 75 MCG/HR, Apply 2 patches to skin every 2 days if tolerated.   NOTE: Do not apply 100 g per hour fentanyl patch since insurance will not  approve 100 g patch for you, Disp: 30 patch, Rfl: 0 .  fentaNYL (DURAGESIC - DOSED MCG/HR) 75 MCG/HR, Apply 2 patches to skin every 2 days if tolerated.   NOTE: Do not apply 100 g per hour fentanyl patch since insurance will not approve 100 g patch for you, Disp: 30 patch, Rfl: 0 .  glucose blood (ONE TOUCH ULTRA TEST) test strip, 1 strip by Other route 2 (two) times daily., Disp: , Rfl:  .  insulin lispro (HUMALOG) 100 UNIT/ML KiwkPen, Inject into the skin., Disp: , Rfl:  .  metFORMIN (GLUCOPHAGE) 1000 MG tablet, Take 1 tablet (1,000 mg total) by mouth 2 (  two) times daily with a meal., Disp: 180 tablet, Rfl: 1 .  metoCLOPramide (REGLAN) 5 MG tablet, Take 1 tablet (5 mg total) by mouth 3 (three) times daily., Disp: 270 tablet, Rfl: 0 .  Multiple Vitamins-Minerals (MULTIVITAMIN WITH MINERALS) tablet, Take 1 tablet by mouth daily., Disp: , Rfl:  .  Olmesartan-Amlodipine-HCTZ (TRIBENZOR) 40-10-25 MG TABS, Take 1 tablet by mouth daily., Disp: 90 tablet, Rfl: 1 .  Oxycodone HCl 10 MG TABS, Limit 1 tab by mouth 4-8 times per day for breakthrough pain if tolerated while wearing fentanyl patches, Disp: 200 tablet, Rfl: 0 .  polyethylene glycol (MIRALAX / GLYCOLAX) packet, Take 17 g by mouth daily., Disp: 14 each, Rfl: 0 .  pravastatin (PRAVACHOL) 40 MG tablet, TAKE 1 TABLET (40 MG TOTAL) BY MOUTH DAILY., Disp: 90 tablet, Rfl: 1 .  venlafaxine XR (EFFEXOR-XR) 150 MG 24 hr capsule, TAKE ONE CAPSULE BY MOUTH ONCE DAILY FOR  DEPRESSION, Disp: 90 capsule, Rfl: 1  Allergies  Allergen Reactions  . Diclofenac Sodium Swelling  . Naproxen Sodium Swelling  . Etodolac Swelling  . Gabapentin Swelling  . Naproxen Swelling     ROS   Constitutional: Negative for fever or weight change.  Respiratory: Negative for cough and shortness of breath.   Cardiovascular: Negative for chest pain or palpitations.  Gastrointestinal: Negative for abdominal pain, no bowel changes.  Musculoskeletal: Positive or gait problem  but no joint swelling.  Skin: Negative for rash.  Neurological: Negative for dizziness, but has daily  Headache over the past 3 weeks.  No other specific complaints in a complete review of systems (except as listed in HPI above).  Objective  Vitals:   03/10/18 1425  BP: 120/60  Pulse: 100  Resp: 16  SpO2: 95%  Weight: 185 lb (83.9 kg)  Height: _0  (1.549 m)    Body mass index is 34.96 kg/m.  Physical Exam  Constitutional: Patient appears well-developed and well-nourished. Obese No distress.  HEENT: head atraumatic, normocephalic, pupils equal and reactive to light,  neck supple, throat within normal limits Cardiovascular: Normal rate, regular rhythm and normal heart sounds.  No murmur heard. No BLE edema. Pulmonary/Chest: Effort normal and breath sounds normal. No respiratory distress. Abdominal: Soft.  There is no tenderness. Psychiatric: Patient has a normal mood and affect. behavior is normal. Judgment and thought content normal. Muscular Skeletal: pain with rom of right hip also decrease in rom of right hip,  also pain during palpation of lumbar spine, negative straight leg raise   Recent Results (from the past 2160 hour(s))  POCT Glucose (CBG)     Status: Abnormal   Collection Time: 03/02/18  3:44 PM  Result Value Ref Range   POC Glucose 181 (A) 70 - 99 mg/dl  POCT HgB A1C     Status: Abnormal   Collection Time: 03/02/18  3:50 PM  Result Value Ref Range   Hemoglobin A1C 9.8 (A) 4.0 - 5.6 %   HbA1c, POC (prediabetic range)  5.7 - 6.4 %   HbA1c, POC (controlled diabetic range)  0.0 - 7.0 %     PHQ2/9: Depression screen [facility] 2/9 03/10/2018 06/30/2017 05/16/2017 04/04/2017 12/23/2016  Decreased Interest 1 0 0 3 0  Down, Depressed, Hopeless 1 0 0 1 0  PHQ - 2 Score 2 0 0 4 0  Altered sleeping 2 - - 3 -  Tired, decreased energy 2 - - 3 -  Change in appetite 3 - - 3 -  Feeling bad or failure  about yourself  1 - - 0 -  Trouble concentrating 0 - - 0 -  Moving slowly or  fidgety/restless 0 - - 1 -  Suicidal thoughts 0 - - 0 -  PHQ-9 Score 10 - - 14 -  Difficult doing work/chores Not difficult at all - - Somewhat difficult -     Fall Risk: Fall Risk  06/30/2017 05/16/2017 04/04/2017 12/23/2016 06/25/2016  Falls in the past year? _0   Comment - - - - -  Risk for fall due to : - - - - -    I personally reviewed Family, Social and Surgical history with the patient/caregiver today.   Assessment & Plan  1. Type 2 diabetes, uncontrolled, with gastroparesis (HCC)  - metoCLOPramide (REGLAN) 5 MG tablet; Take 1 tablet (5 mg total) by mouth 3 (three) times daily.  Dispense: 270 tablet; Refill: 0  2. Hypertension, benign  At goal, continue medications - carvedilol (COREG) 3.125 MG tablet; Take 1 tablet (3.125 mg total) by mouth 2 (two) times daily.  Dispense: 180 tablet; Refill: 1 - Olmesartan-amLODIPine-HCTZ (TRIBENZOR) 40-10-25 MG TABS; Take 1 tablet by mouth daily.  Dispense: 90 tablet; Refill: 1  3. Moderate episode of recurrent major depressive disorder ([place])  She is willing to see psychiatrist now - venlafaxine XR (EFFEXOR-XR) 150 MG 24 hr capsule; TAKE ONE CAPSULE BY MOUTH ONCE DAILY FOR  DEPRESSION  Dispense: 90 capsule; Refill: 1 - referral psychiatrist   4. Hyperlipidemia  Reviewed labs done at endocrinologist office - pravastatin (PRAVACHOL) 40 MG tablet; Take 1 tablet (40 mg total) by mouth daily.  Dispense: 90 tablet; Refill: 1  5. Daily headache  It was severe 2 weeks ago, but states went out of town on vacation and is doing better, still daily but not as intense and has follow up with neurologist coming up soon.   6. Diabetes mellitus with microalbuminuria ([place])  Last urine micro done at endocrinologist, on ARB  7. Dyslipidemia associated with type 2 diabetes mellitus ([place])  Controlled with medication and does not want to change at this time, discussed high dose statin therapy   8. Chronic pain syndrome  Seeing ortho for  possible hip replacement, but needs better control of DM, also goes to pain clinic , seeing Dr. Primus Bravo   9. Neuropathy due to secondary diabetes ([place])  Stable on medication   10. Tobacco use  Discussed importance of quitting and ways to quit  11. Morbid obesity ([place])  Based on BMI associated with co-morbidities such as DM, HTN and hyperlipidemia Discussed with the patient the risk posed by an increased BMI. Discussed importance of portion control, calorie counting and at least 150 minutes of physical activity weekly. Avoid sweet beverages and drink more water. Eat at least 6 servings of fruit and vegetables daily   12. Hypertension associated with diabetes ([place])  Controlled with medication, but DM is not controlled

## 2018-03-11 ENCOUNTER — Other Ambulatory Visit: Payer: Self-pay | Admitting: Family Medicine

## 2018-03-11 ENCOUNTER — Other Ambulatory Visit: Payer: Self-pay

## 2018-03-11 DIAGNOSIS — E113293 Type 2 diabetes mellitus with mild nonproliferative diabetic retinopathy without macular edema, bilateral: Secondary | ICD-10-CM | POA: Diagnosis not present

## 2018-03-11 DIAGNOSIS — H40023 Open angle with borderline findings, high risk, bilateral: Secondary | ICD-10-CM | POA: Diagnosis not present

## 2018-03-11 DIAGNOSIS — F331 Major depressive disorder, recurrent, moderate: Secondary | ICD-10-CM

## 2018-03-11 DIAGNOSIS — Z9842 Cataract extraction status, left eye: Secondary | ICD-10-CM | POA: Diagnosis not present

## 2018-03-11 DIAGNOSIS — Z9841 Cataract extraction status, right eye: Secondary | ICD-10-CM | POA: Diagnosis not present

## 2018-03-11 DIAGNOSIS — H40053 Ocular hypertension, bilateral: Secondary | ICD-10-CM | POA: Diagnosis not present

## 2018-03-11 MED ORDER — VENLAFAXINE HCL ER 150 MG PO CP24: ORAL_CAPSULE | ORAL | 1 refills | Status: DC

## 2018-03-25 DIAGNOSIS — Z5181 Encounter for therapeutic drug level monitoring: Secondary | ICD-10-CM | POA: Diagnosis not present

## 2018-03-25 DIAGNOSIS — M5416 Radiculopathy, lumbar region: Secondary | ICD-10-CM | POA: Diagnosis not present

## 2018-03-25 DIAGNOSIS — M5136 Other intervertebral disc degeneration, lumbar region: Secondary | ICD-10-CM | POA: Diagnosis not present

## 2018-03-25 DIAGNOSIS — M5441 Lumbago with sciatica, right side: Secondary | ICD-10-CM | POA: Diagnosis not present

## 2018-04-06 ENCOUNTER — Other Ambulatory Visit: Payer: Self-pay | Admitting: Family Medicine

## 2018-04-06 DIAGNOSIS — Z1231 Encounter for screening mammogram for malignant neoplasm of breast: Secondary | ICD-10-CM

## 2018-04-07 DIAGNOSIS — Z794 Long term (current) use of insulin: Secondary | ICD-10-CM | POA: Diagnosis not present

## 2018-04-07 DIAGNOSIS — E1169 Type 2 diabetes mellitus with other specified complication: Secondary | ICD-10-CM | POA: Diagnosis not present

## 2018-04-07 DIAGNOSIS — I10 Essential (primary) hypertension: Secondary | ICD-10-CM | POA: Diagnosis not present

## 2018-04-07 DIAGNOSIS — E1165 Type 2 diabetes mellitus with hyperglycemia: Secondary | ICD-10-CM | POA: Diagnosis not present

## 2018-04-07 DIAGNOSIS — E1159 Type 2 diabetes mellitus with other circulatory complications: Secondary | ICD-10-CM | POA: Diagnosis not present

## 2018-04-20 DIAGNOSIS — M5441 Lumbago with sciatica, right side: Secondary | ICD-10-CM | POA: Diagnosis not present

## 2018-04-20 DIAGNOSIS — M5416 Radiculopathy, lumbar region: Secondary | ICD-10-CM | POA: Diagnosis not present

## 2018-04-20 DIAGNOSIS — Z5181 Encounter for therapeutic drug level monitoring: Secondary | ICD-10-CM | POA: Diagnosis not present

## 2018-04-20 DIAGNOSIS — M5136 Other intervertebral disc degeneration, lumbar region: Secondary | ICD-10-CM | POA: Diagnosis not present

## 2018-04-23 ENCOUNTER — Ambulatory Visit
Admission: RE | Admit: 2018-04-23 | Discharge: 2018-04-23 | Disposition: A | Discharge status: Home / Self Care | Payer: Medicare Other | Source: Ambulatory Visit | Attending: Family Medicine | Admitting: Family Medicine

## 2018-04-23 DIAGNOSIS — Z1231 Encounter for screening mammogram for malignant neoplasm of breast: Secondary | ICD-10-CM | POA: Diagnosis not present

## 2018-05-27 DIAGNOSIS — M5441 Lumbago with sciatica, right side: Secondary | ICD-10-CM | POA: Diagnosis not present

## 2018-05-27 DIAGNOSIS — M5416 Radiculopathy, lumbar region: Secondary | ICD-10-CM | POA: Diagnosis not present

## 2018-05-27 DIAGNOSIS — Z5181 Encounter for therapeutic drug level monitoring: Secondary | ICD-10-CM | POA: Diagnosis not present

## 2018-05-27 DIAGNOSIS — M5136 Other intervertebral disc degeneration, lumbar region: Secondary | ICD-10-CM | POA: Diagnosis not present

## 2018-06-11 ENCOUNTER — Other Ambulatory Visit: Payer: Self-pay

## 2018-06-11 NOTE — Patient Outreach (Signed)
[facility]) Care Management  06/11/2018  Artelia Game [facility] [birth year] [id number]   Medication Adherence call to Mrs. Tamra Nastasi left a message for patient to call back patient is due on Pravastatin 40 mg. Mrs. Dugger is showing past due under [place].   [place] Management Direct Dial [phone]  Fax [phone] [email or web address]

## 2018-06-24 DIAGNOSIS — M5416 Radiculopathy, lumbar region: Secondary | ICD-10-CM | POA: Diagnosis not present

## 2018-06-24 DIAGNOSIS — M5136 Other intervertebral disc degeneration, lumbar region: Secondary | ICD-10-CM | POA: Diagnosis not present

## 2018-06-24 DIAGNOSIS — M5441 Lumbago with sciatica, right side: Secondary | ICD-10-CM | POA: Diagnosis not present

## 2018-06-24 DIAGNOSIS — Z5181 Encounter for therapeutic drug level monitoring: Secondary | ICD-10-CM | POA: Diagnosis not present

## 2018-07-15 DIAGNOSIS — Z794 Long term (current) use of insulin: Secondary | ICD-10-CM | POA: Diagnosis not present

## 2018-07-15 DIAGNOSIS — E785 Hyperlipidemia, unspecified: Secondary | ICD-10-CM | POA: Diagnosis not present

## 2018-07-15 DIAGNOSIS — E1159 Type 2 diabetes mellitus with other circulatory complications: Secondary | ICD-10-CM | POA: Diagnosis not present

## 2018-07-15 DIAGNOSIS — E1169 Type 2 diabetes mellitus with other specified complication: Secondary | ICD-10-CM | POA: Diagnosis not present

## 2018-07-15 DIAGNOSIS — E1165 Type 2 diabetes mellitus with hyperglycemia: Secondary | ICD-10-CM | POA: Diagnosis not present

## 2018-07-15 LAB — LIPID PANEL
Cholesterol: 169 (ref 0–200)
HDL: 59 (ref 35–70)
LDL Cholesterol: 84
Triglycerides: 131 (ref 40–160)

## 2018-07-15 LAB — MICROALBUMIN, URINE: MICROALB UR: 19

## 2018-07-22 DIAGNOSIS — M5416 Radiculopathy, lumbar region: Secondary | ICD-10-CM | POA: Diagnosis not present

## 2018-07-22 DIAGNOSIS — M5441 Lumbago with sciatica, right side: Secondary | ICD-10-CM | POA: Diagnosis not present

## 2018-07-22 DIAGNOSIS — M5136 Other intervertebral disc degeneration, lumbar region: Secondary | ICD-10-CM | POA: Diagnosis not present

## 2018-07-22 DIAGNOSIS — Z5181 Encounter for therapeutic drug level monitoring: Secondary | ICD-10-CM | POA: Diagnosis not present

## 2018-09-02 DIAGNOSIS — Z5181 Encounter for therapeutic drug level monitoring: Secondary | ICD-10-CM | POA: Diagnosis not present

## 2018-09-02 DIAGNOSIS — M5416 Radiculopathy, lumbar region: Secondary | ICD-10-CM | POA: Diagnosis not present

## 2018-09-02 DIAGNOSIS — M5136 Other intervertebral disc degeneration, lumbar region: Secondary | ICD-10-CM | POA: Diagnosis not present

## 2018-09-02 DIAGNOSIS — M5441 Lumbago with sciatica, right side: Secondary | ICD-10-CM | POA: Diagnosis not present

## 2018-09-11 ENCOUNTER — Ambulatory Visit: Payer: Medicare Other

## 2018-09-11 ENCOUNTER — Ambulatory Visit: Payer: Medicare Other | Admitting: Family Medicine

## 2018-09-14 ENCOUNTER — Ambulatory Visit: Payer: Medicare Other | Admitting: Family Medicine

## 2018-09-29 DIAGNOSIS — M5136 Other intervertebral disc degeneration, lumbar region: Secondary | ICD-10-CM | POA: Diagnosis not present

## 2018-09-29 DIAGNOSIS — M5416 Radiculopathy, lumbar region: Secondary | ICD-10-CM | POA: Diagnosis not present

## 2018-09-29 DIAGNOSIS — Z5181 Encounter for therapeutic drug level monitoring: Secondary | ICD-10-CM | POA: Diagnosis not present

## 2018-09-29 DIAGNOSIS — M545 Low back pain: Secondary | ICD-10-CM | POA: Diagnosis not present

## 2018-10-05 DIAGNOSIS — H40053 Ocular hypertension, bilateral: Secondary | ICD-10-CM | POA: Diagnosis not present

## 2018-10-05 DIAGNOSIS — H40023 Open angle with borderline findings, high risk, bilateral: Secondary | ICD-10-CM | POA: Diagnosis not present

## 2018-10-28 DIAGNOSIS — Z5181 Encounter for therapeutic drug level monitoring: Secondary | ICD-10-CM | POA: Diagnosis not present

## 2018-10-28 DIAGNOSIS — M5136 Other intervertebral disc degeneration, lumbar region: Secondary | ICD-10-CM | POA: Diagnosis not present

## 2018-10-28 DIAGNOSIS — M5416 Radiculopathy, lumbar region: Secondary | ICD-10-CM | POA: Diagnosis not present

## 2018-10-28 DIAGNOSIS — M5441 Lumbago with sciatica, right side: Secondary | ICD-10-CM | POA: Diagnosis not present

## 2018-10-29 ENCOUNTER — Other Ambulatory Visit: Payer: Self-pay | Admitting: Family Medicine

## 2018-10-29 DIAGNOSIS — E785 Hyperlipidemia, unspecified: Secondary | ICD-10-CM

## 2018-10-29 DIAGNOSIS — I10 Essential (primary) hypertension: Secondary | ICD-10-CM

## 2018-10-29 NOTE — Telephone Encounter (Signed)
Refill Request for Cholesterol medication. Pravastatin and Carvedilol   Last visit: 03/10/2018   Lab Results  Component Value Date   CHOL 189 06/12/2017   HDL 51 06/12/2017   LDLCALC 104 06/12/2017   TRIG 171 (A) 06/12/2017   CHOLHDL 3.0 05/12/2015    Follow up on 12/01/2018

## 2018-11-04 ENCOUNTER — Ambulatory Visit: Payer: Medicare Other | Admitting: Family Medicine

## 2018-11-10 ENCOUNTER — Ambulatory Visit: Payer: Medicare Other | Admitting: Family Medicine

## 2018-11-25 DIAGNOSIS — E1169 Type 2 diabetes mellitus with other specified complication: Secondary | ICD-10-CM | POA: Diagnosis not present

## 2018-11-25 DIAGNOSIS — I10 Essential (primary) hypertension: Secondary | ICD-10-CM | POA: Diagnosis not present

## 2018-11-25 DIAGNOSIS — E1165 Type 2 diabetes mellitus with hyperglycemia: Secondary | ICD-10-CM | POA: Diagnosis not present

## 2018-11-25 DIAGNOSIS — Z794 Long term (current) use of insulin: Secondary | ICD-10-CM | POA: Diagnosis not present

## 2018-11-25 DIAGNOSIS — E1159 Type 2 diabetes mellitus with other circulatory complications: Secondary | ICD-10-CM | POA: Diagnosis not present

## 2018-11-25 LAB — HEMOGLOBIN A1C: HEMOGLOBIN A1C: 8.4

## 2018-12-01 ENCOUNTER — Ambulatory Visit (INDEPENDENT_AMBULATORY_CARE_PROVIDER_SITE_OTHER): Payer: Medicare Other

## 2018-12-01 ENCOUNTER — Encounter: Payer: Self-pay | Admitting: Family Medicine

## 2018-12-01 ENCOUNTER — Ambulatory Visit (INDEPENDENT_AMBULATORY_CARE_PROVIDER_SITE_OTHER): Payer: Medicare Other | Admitting: Family Medicine

## 2018-12-01 VITALS — BP 102/50 | HR 90 | Temp 98.5°F | Resp 16 | Ht 61.0 in | Wt 190.6 lb

## 2018-12-01 DIAGNOSIS — Z23 Encounter for immunization: Secondary | ICD-10-CM | POA: Diagnosis not present

## 2018-12-01 DIAGNOSIS — F331 Major depressive disorder, recurrent, moderate: Secondary | ICD-10-CM

## 2018-12-01 DIAGNOSIS — E119 Type 2 diabetes mellitus without complications: Secondary | ICD-10-CM

## 2018-12-01 DIAGNOSIS — Z1231 Encounter for screening mammogram for malignant neoplasm of breast: Secondary | ICD-10-CM | POA: Diagnosis not present

## 2018-12-01 DIAGNOSIS — G894 Chronic pain syndrome: Secondary | ICD-10-CM

## 2018-12-01 DIAGNOSIS — E785 Hyperlipidemia, unspecified: Secondary | ICD-10-CM | POA: Diagnosis not present

## 2018-12-01 DIAGNOSIS — R809 Proteinuria, unspecified: Secondary | ICD-10-CM

## 2018-12-01 DIAGNOSIS — I10 Essential (primary) hypertension: Secondary | ICD-10-CM | POA: Diagnosis not present

## 2018-12-01 DIAGNOSIS — E1129 Type 2 diabetes mellitus with other diabetic kidney complication: Secondary | ICD-10-CM

## 2018-12-01 DIAGNOSIS — Z0000 Encounter for general adult medical examination without abnormal findings: Secondary | ICD-10-CM | POA: Diagnosis not present

## 2018-12-01 DIAGNOSIS — Z780 Asymptomatic menopausal state: Secondary | ICD-10-CM | POA: Diagnosis not present

## 2018-12-01 MED ORDER — CARVEDILOL 3.125 MG PO TABS: 3.1250 mg | ORAL_TABLET | Freq: Two times a day (BID) | ORAL | 1 refills | Status: DC

## 2018-12-01 MED ORDER — VENLAFAXINE HCL ER 150 MG PO CP24: ORAL_CAPSULE | ORAL | 1 refills | Status: DC

## 2018-12-01 MED ORDER — PRAVASTATIN SODIUM 40 MG PO TABS: 40.0000 mg | ORAL_TABLET | Freq: Every day | ORAL | 1 refills | Status: DC

## 2018-12-01 MED ORDER — OLMESARTAN-AMLODIPINE-HCTZ 40-10-25 MG PO TABS: 1.0000 | ORAL_TABLET | Freq: Every day | ORAL | 1 refills | Status: DC

## 2018-12-01 NOTE — Progress Notes (Signed)
Name: Lorraine Jones   MRN: [id number]    DOB: 22-Feb-1951   Date:12/01/2018       Progress Note  Subjective  Chief Complaint  Chief Complaint  Patient presents with  . Diabetes  . Hypertension  . Hyperlipidemia  . Pain    HPI  DMII with neuropathy,dyslipidemia and gastroparesis, microalbuminuria: she is now on Metformin and insulin  (not sure of the name) R and N types under the care of  Dr. Vonda Antigua states she has been compliant with her diet . She denies polydipsia or polyphagia, but she has intermittent episodes of polyuria, she has intermittent symptoms of neuropathy, described as aching like.  She is taking statins and aspirin daily also on ARB, a last urine micro was normal at Dr. Lenon Ahmadi office  She states indigestion is under control with Reglan before meals. [facility]lipid panel done [facility] and reviewed today - LDL slightly above goal but does not want to change medication   HTN in DM: she needs refill of Tribenzor and carvedilol, she is compliant with medication and bp is at goal. No chest pain or palpitation. Denies lower extremity edema. BP today is low, reviewed bp done in our office that has been better than today but towards low end of normal, it was 140/80 during recent visit to endocrinologist. Since no dizziness we will continue current dose and monitor   Chronic Pain: she sees Dr. Primus Bravo at least once a month, taking pain medication as prescribed, she has constipation secondary to narcotics and is taking Miralax otc to control symptoms.She still did not have hip replacement, she needs to wait until A1C is below 8. She forgot name of provider  Hyperlipidemia: taking Pravastatin and denies side effects, discussed switching to Atorvastatin but she is afraid of changing. She denies myalgias secondary to medication. No chest pain. last labs reviewed.   Osteopenia: she had a bone density back in 03/2016 , her FRAX score is 3.2 % for major osteoporotic fracture  in the next 10 years and 0.4 for hip .She is taking calcium plus D, we will recheck bone density in a few years because of cost. Unchanged   Obesity: BMI above 35 with co-,morbidities, discussed importance of weight loss. Low carb diet ( follow a diabetic diet) try water activities   Depression moderate and recurrent: she has a long of major depression, part of it secondary to living in pain and inability to do things she likes. She states Effexor and states coping better. States reading the bible and some other books and it seems to help her emotionally. No suicidal thoughts or ideation.   Patient Active Problem List   Diagnosis Date Noted  . Primary osteoarthritis of both hips 02/06/2018  . Hypertension associated with diabetes ([place]) 07/23/2017  . Hyperlipidemia due to type 2 diabetes mellitus ([place]) 07/23/2017  . Major depression, recurrent ([place]) 06/25/2016  . Osteopenia 04/18/2016  . Gastroesophageal reflux disease without esophagitis 05/12/2015  . Chronic pain 05/12/2015  . Hyperlipidemia 05/12/2015  . Lumbar post-laminectomy syndrome 02/27/2015  . Neuropathy due to secondary diabetes ([place]) 02/27/2015  . Spinal stenosis, lumbar region, with neurogenic claudication 02/27/2015  . Sacroiliac joint dysfunction of both sides 02/27/2015  . Degenerative joint disease (DJD) of hip 02/27/2015  . DDD (degenerative disc disease), cervical 02/27/2015  . Bilateral occipital neuralgia 02/27/2015  . Chronic constipation 06/02/2014  . Hyponatremia 06/01/2014  . Hypertension, benign 06/01/2014    Past Surgical History:  Procedure Laterality Date  . CHOLECYSTECTOMY  [place] URETEROSCOPY Right 09/01/2013   Procedure: CYSTOSCOPY WITH UNROOFING  RIGHT URETEROCELE AND URETERAL STONE REMOVED  WITH GYRUS COLLINS KNIFE. ;  Surgeon: Irine Seal, MD;  Location: [facility];  Service: Urology;  Laterality: Right;  cysto, right uretersoscopy and stone extraction   collins  knife  . LUMBAR DISC SURGERY  1993   L4  --  L5    Family History  Problem Relation Age of Onset  . Diabetes Mother   . Heart disease Mother   . Hypertension Mother   . Breast cancer Neg Hx     Social History   Socioeconomic History  . Marital status: Married    Spouse name: Not on file  . Number of children: 3  . Years of education: Not on file  . Highest education level: 12th grade  Occupational History  . Occupation: retired  Scientific laboratory technician  . Financial resource strain: Not very hard  . Food insecurity:    Worry: Never true    Inability: Never true  . Transportation needs:    Medical: No    Non-medical: No  Tobacco Use  . Smoking status: Current Every Day Smoker    Packs/day: 0.50    Years: 20.00    Pack years: 10.00    Types: Cigarettes  . Smokeless tobacco: Never Used  . Tobacco comment: she is cutting down   Substance and Sexual Activity  . Alcohol use: No    Alcohol/week: 0.0 standard drinks  . Drug use: No  . Sexual activity: Yes    Partners: Male  Lifestyle  . Physical activity:    Days per week: 0 days    Minutes per session: 0 min  . Stress: Not at all  Relationships  . Social connections:    Talks on phone: More than three times a week    Gets together: Three times a week    Attends religious service: More than 4 times per year    Active member of club or organization: No    Attends meetings of clubs or organizations: Never    Relationship status: Married  . Intimate partner violence:    Fear of current or ex partner: No    Emotionally abused: No    Physically abused: No    Forced sexual activity: No  Other Topics Concern  . Not on file  Social History Narrative  . Not on file     Current Outpatient Medications:  .  aspirin EC 81 MG tablet, Take 81 mg by mouth daily., Disp: , Rfl:  .  Blood Glucose Monitoring Suppl (ONETOUCH VERIO) w/Device KIT, USE TO TEST BLOOD SUGAR, Disp: , Rfl:  .  carvedilol (COREG) 3.125 MG tablet, TAKE 1  TABLET BY MOUTH TWICE DAILY, Disp: 180 tablet, Rfl: 0 .  Continuous Blood Gluc Receiver (FREESTYLE LIBRE 14 DAY READER) DEVI, Use 1 each as needed .  Use to test blood sugar, Disp: , Rfl:  .  Continuous Blood Gluc Sensor (FREESTYLE LIBRE 14 DAY SENSOR) MISC, Use 1 each every 14 (fourteen) days, Disp: , Rfl:  .  dorzolamide-timolol (COSOPT) 22.3-6.8 MG/ML ophthalmic solution, Place 1 drop into both eyes 2 (two) times daily., Disp: , Rfl:  .  fentaNYL (DURAGESIC - DOSED MCG/HR) 75 MCG/HR, Apply 2 patches to skin every 2 days if tolerated.   NOTE: Do not apply 100 g per hour fentanyl patch since insurance will not approve 100 g patch for you, Disp: 30 patch,  Rfl: 0 .  glucose blood (ONE TOUCH ULTRA TEST) test strip, 1 strip by Other route 2 (two) times daily., Disp: , Rfl:  .  metFORMIN (GLUCOPHAGE) 1000 MG tablet, Take 1 tablet (1,000 mg total) by mouth 2 (two) times daily with a meal., Disp: 180 tablet, Rfl: 1 .  metoCLOPramide (REGLAN) 5 MG tablet, Take 1 tablet (5 mg total) by mouth 3 (three) times daily., Disp: 270 tablet, Rfl: 0 .  Multiple Vitamins-Minerals (MULTIVITAMIN WITH MINERALS) tablet, Take 1 tablet by mouth daily., Disp: , Rfl:  .  NOVOLIN N RELION 100 UNIT/ML injection, INJECT 28 UNITS SUBCUTANEOUSLY TWO TIMES DAILY., Disp: , Rfl:  .  NOVOLIN R RELION 100 UNIT/ML injection, INJECT 20 UNITS SUBCUTANEOUSLY THREE TIMES DAILY BEFORE MEAL(S) (ADD SLIDING SCALE AS NECESSARY), Disp: , Rfl:  .  Olmesartan-amLODIPine-HCTZ (TRIBENZOR) 40-10-25 MG TABS, Take 1 tablet by mouth daily., Disp: 90 tablet, Rfl: 1 .  Oxycodone HCl 20 MG TABS, LIMIT 1/2 TO 1 TABLET BY MOUTH 3 TO 5 TIMES PER DAY IF TOLERATED *NOTE TABLET IS TWICE AS STRONG*, Disp: , Rfl:  .  polyethylene glycol (MIRALAX / GLYCOLAX) packet, Take 17 g by mouth daily., Disp: 14 each, Rfl: 0 .  pravastatin (PRAVACHOL) 40 MG tablet, TAKE 1 TABLET BY MOUTH ONCE DAILY, Disp: 90 tablet, Rfl: 0 .  venlafaxine XR (EFFEXOR-XR) 150 MG 24 hr capsule,  TAKE ONE CAPSULE BY MOUTH ONCE DAILY FOR  DEPRESSION, Disp: 90 capsule, Rfl: 1  Allergies  Allergen Reactions  . Diclofenac Sodium Swelling  . Naproxen Sodium Swelling  . Etodolac Swelling  . Gabapentin Swelling  . Naproxen Swelling    I personally reviewed active problem list, medication list, allergies, family history, social history with the patient/caregiver today.   ROS  Constitutional: Negative for fever or significant weight change.  Respiratory: Negative for cough and shortness of breath.   Cardiovascular: Negative for chest pain or palpitations.  Gastrointestinal: Negative for abdominal pain, no bowel changes.  Musculoskeletal: Positive  for gait problem but no  joint swelling.  Skin: Negative for rash.  Neurological: Negative for dizziness or headache.  No other specific complaints in a complete review of systems (except as listed in HPI above).  Objective  Vitals:   12/01/18 1031  BP: (!) 102/50  Pulse: 90  Resp: 16  Temp: 98.5 F (36.9 C)  TempSrc: Oral  SpO2: 97%  Weight: 190 lb 9.6 oz (86.5 kg)  Height: 5' 1"  (1.549 m)    Body mass index is 36.01 kg/m.  Physical Exam  Constitutional: Patient appears well-developed and well-nourished. Obese  No distress.  HEENT: head atraumatic, normocephalic, pupils equal and reactive to light,  neck supple, throat within normal limits Cardiovascular: Normal rate, regular rhythm and normal heart sounds.  No murmur heard. Trace BLE edema. Pulmonary/Chest: Effort normal and breath sounds normal. No respiratory distress. Abdominal: Soft.  There is no tenderness. Muscular skeletal: using a cane  Psychiatric: Patient has a normal mood and affect. behavior is normal. Judgment and thought content normal.  Recent Results (from the past 2160 hour(s))  Hemoglobin A1c     Status: None   Collection Time: 11/25/18 12:00 AM  Result Value Ref Range   Hemoglobin A1C 8.4       PHQ2/9: Depression screen [facility] 2/9 12/01/2018  03/10/2018 06/30/2017 05/16/2017 04/04/2017  Decreased Interest 0 1 0 0 3  Down, Depressed, Hopeless 0 1 0 0 1  PHQ - 2 Score 0 2 0 0 4  Altered  sleeping 0 2 - - 3  Tired, decreased energy 1 2 - - 3  Change in appetite 1 3 - - 3  Feeling bad or failure about yourself  0 1 - - 0  Trouble concentrating 0 0 - - 0  Moving slowly or fidgety/restless 0 0 - - 1  Suicidal thoughts 0 0 - - 0  PHQ-9 Score 2 10 - - 14  Difficult doing work/chores Not difficult at all Not difficult at all - - Somewhat difficult     Fall Risk: Fall Risk  12/01/2018 12/01/2018 06/30/2017 05/16/2017 04/04/2017  Falls in the past year? 0 0 No No No  Comment - - - - -  Number falls in past yr: 0 0 - - -  Injury with Fall? 0 0 - - -  Risk for fall due to : - - - - -  Follow up Falls prevention discussed - - - -     Assessment & Plan   1. Flu vaccine need  Today   2. Hypertension, benign  - carvedilol (COREG) 3.125 MG tablet; Take 1 tablet (3.125 mg total) by mouth 2 (two) times daily.  Dispense: 180 tablet; Refill: 1 - Olmesartan-amLODIPine-HCTZ (TRIBENZOR) 40-10-25 MG TABS; Take 1 tablet by mouth daily.  Dispense: 90 tablet; Refill: 1  3. Dyslipidemia  - pravastatin (PRAVACHOL) 40 MG tablet; Take 1 tablet (40 mg total) by mouth daily.  Dispense: 90 tablet; Refill: 1  4. Moderate episode of recurrent major depressive disorder (HCC)  - venlafaxine XR (EFFEXOR-XR) 150 MG 24 hr capsule; TAKE ONE CAPSULE BY MOUTH ONCE DAILY FOR  DEPRESSION  Dispense: 90 capsule; Refill: 1  5. Diabetes mellitus with coincident hypertension (HCC)  - carvedilol (COREG) 3.125 MG tablet; Take 1 tablet (3.125 mg total) by mouth 2 (two) times daily.  Dispense: 180 tablet; Refill: 1 - Olmesartan-amLODIPine-HCTZ (TRIBENZOR) 40-10-25 MG TABS; Take 1 tablet by mouth daily.  Dispense: 90 tablet; Refill: 1  6. Diabetes mellitus with microalbuminuria (HCC)  - Olmesartan-amLODIPine-HCTZ (TRIBENZOR) 40-10-25 MG TABS; Take 1 tablet by mouth daily.   Dispense: 90 tablet; Refill: 1  7. Chronic pain syndrome  Seeing Dr. Primus Bravo   8. Morbid obesity ([place])  Discussed with the patient the risk posed by an increased BMI. Discussed importance of portion control, calorie counting and at least 150 minutes of physical activity weekly. Avoid sweet beverages and drink more water. Eat at least 6 servings of fruit and vegetables daily   BMI of 36 with HTN, DM, Pain

## 2018-12-01 NOTE — Progress Notes (Signed)
Subjective:   Lorraine Jones is a 68 y.o. female who presents for Medicare Annual (Subsequent) preventive examination.  Review of Systems:   Cardiac Risk Factors include: advanced age (>67mn, >>103women);diabetes mellitus;dyslipidemia;hypertension;smoking/ tobacco exposure     Objective:     Vitals: There were no vitals taken for this visit.  There is no height or weight on file to calculate BMI.  Advanced Directives 12/01/2018 05/16/2017 04/04/2017 12/23/2016 06/25/2016 06/10/2016 05/13/2016  Does Patient Have a Medical Advance Directive? No No No No No No No  Would patient like information on creating a medical advance directive? Yes (MAU/Ambulatory/Procedural Areas - Information given) - - - - - -  Pre-existing out of facility DNR order (yellow form or pink MOST form) - - - - - - -    Tobacco Social History   Tobacco Use  Smoking Status Current Every Day Smoker  . Packs/day: 0.50  . Years: 20.00  . Pack years: 10.00  . Types: Cigarettes  Smokeless Tobacco Never Used  Tobacco Comment   she is cutting down      Ready to quit: Not Answered Counseling given: Not Answered Comment: she is cutting down    Clinical Intake:  Pre-visit preparation completed: Yes  Pain : No/denies pain     Nutritional Risks: None Diabetes: Yes CBG done?: No Did pt. bring in CBG monitor from home?: No   Nutrition Risk Assessment:  Has the patient had any N/V/D within the last 2 months?  No  Does the patient have any non-healing wounds?  No  Has the patient had any unintentional weight loss or weight gain?  No   Diabetes:  Is the patient diabetic?  Yes  If diabetic, was a CBG obtained today?  No  Did the patient bring in their glucometer from home?  No  How often do you monitor your CBG's? daily.   Financial Strains and Diabetes Management:  Are you having any financial strains with the device, your supplies or your medication? No .  Does the patient want to be seen by Chronic Care  Management for management of their diabetes?  No  Would the patient like to be referred to a Nutritionist or for Diabetic Management?  No   Diabetic Exams:  Diabetic Eye Exam: Completed 03/11/18.   Diabetic Foot Exam: Completed per patient by Dr. OHonor Junes   How often do you need to have someone help you when you read instructions, pamphlets, or other written materials from your doctor or pharmacy?: 1 - Never  Interpreter Needed?: No  Information entered by :: KClemetine MarkerLPN  Past Medical History:  Diagnosis Date  . Arthritis   . Chronic low back pain   . Depression   . Diabetes mellitus   . Hyperlipidemia   . Hypertension    Past Surgical History:  Procedure Laterality Date  . CHOLECYSTECTOMY  1996  . CYSTOSCOPY WITH URETEROSCOPY Right 09/01/2013   Procedure: CYSTOSCOPY WITH UNROOFING  RIGHT URETEROCELE AND URETERAL STONE REMOVED  WITH GYRUS COLLINS KNIFE. ;  Surgeon: JIrine Seal MD;  [facility]  Service: Urology;  Laterality: Right;  cysto, right uretersoscopy and stone extraction   collins knife  . LUMBAR DISC SURGERY  1993   L4  --  L5   Family History  Problem Relation Age of Onset  . Diabetes Mother   . Heart disease Mother   . Hypertension Mother   . Breast cancer Neg Hx    Social History  Socioeconomic History  . Marital status: Married    Spouse name: Not on file  . Number of children: 3  . Years of education: Not on file  . Highest education level: 12th grade  Occupational History  . Occupation: retired  Scientific laboratory technician  . Financial resource strain: Not very hard  . Food insecurity:    Worry: Never true    Inability: Never true  . Transportation needs:    Medical: No    Non-medical: No  Tobacco Use  . Smoking status: Current Every Day Smoker    Packs/day: 0.50    Years: 20.00    Pack years: 10.00    Types: Cigarettes  . Smokeless tobacco: Never Used  . Tobacco comment: she is cutting down   Substance and Sexual  Activity  . Alcohol use: No    Alcohol/week: 0.0 standard drinks  . Drug use: No  . Sexual activity: Yes    Partners: Male  Lifestyle  . Physical activity:    Days per week: 0 days    Minutes per session: 0 min  . Stress: Not at all  Relationships  . Social connections:    Talks on phone: More than three times a week    Gets together: Three times a week    Attends religious service: More than 4 times per year    Active member of club or organization: No    Attends meetings of clubs or organizations: Never    Relationship status: Married  Other Topics Concern  . Not on file  Social History Narrative  . Not on file    Outpatient Encounter Medications as of 12/01/2018  Medication Sig  . aspirin EC 81 MG tablet Take 81 mg by mouth daily.  . Blood Glucose Monitoring Suppl (ONETOUCH VERIO) w/Device KIT USE TO TEST BLOOD SUGAR  . carvedilol (COREG) 3.125 MG tablet TAKE 1 TABLET BY MOUTH TWICE DAILY  . dorzolamide-timolol (COSOPT) 22.3-6.8 MG/ML ophthalmic solution Place 1 drop into both eyes 2 (two) times daily.  . fentaNYL (DURAGESIC - DOSED MCG/HR) 75 MCG/HR Apply 2 patches to skin every 2 days if tolerated.   NOTE: Do not apply 100 g per hour fentanyl patch since insurance will not approve 100 g patch for you  . glucose blood (ONE TOUCH ULTRA TEST) test strip 1 strip by Other route 2 (two) times daily.  . metFORMIN (GLUCOPHAGE) 1000 MG tablet Take 1 tablet (1,000 mg total) by mouth 2 (two) times daily with a meal.  . metoCLOPramide (REGLAN) 5 MG tablet Take 1 tablet (5 mg total) by mouth 3 (three) times daily.  . Multiple Vitamins-Minerals (MULTIVITAMIN WITH MINERALS) tablet Take 1 tablet by mouth daily.  Marland Kitchen NOVOLIN N RELION 100 UNIT/ML injection INJECT 28 UNITS SUBCUTANEOUSLY TWO TIMES DAILY.  Marland Kitchen NOVOLIN R RELION 100 UNIT/ML injection INJECT 20 UNITS SUBCUTANEOUSLY THREE TIMES DAILY BEFORE MEAL(S) (ADD SLIDING SCALE AS NECESSARY)  . Olmesartan-amLODIPine-HCTZ (TRIBENZOR) 40-10-25 MG  TABS Take 1 tablet by mouth daily.  . Oxycodone HCl 20 MG TABS LIMIT 1/2 TO 1 TABLET BY MOUTH 3 TO 5 TIMES PER DAY IF TOLERATED *NOTE TABLET IS TWICE AS STRONG*  . polyethylene glycol (MIRALAX / GLYCOLAX) packet Take 17 g by mouth daily.  . pravastatin (PRAVACHOL) 40 MG tablet TAKE 1 TABLET BY MOUTH ONCE DAILY  . venlafaxine XR (EFFEXOR-XR) 150 MG 24 hr capsule TAKE ONE CAPSULE BY MOUTH ONCE DAILY FOR  DEPRESSION  . Continuous Blood Gluc Receiver (FREESTYLE LIBRE 14 DAY READER)  DEVI Use 1 each as needed .  Use to test blood sugar  . Continuous Blood Gluc Sensor (FREESTYLE LIBRE 14 DAY SENSOR) MISC Use 1 each every 14 (fourteen) days  . [DISCONTINUED] blood glucose meter kit and supplies KIT Dispense based on patient and insurance preference. Use up to four times daily as directed. (FOR ICD-9 250.00, 250.01).  . [DISCONTINUED] fentaNYL (DURAGESIC - DOSED MCG/HR) 75 MCG/HR Apply 2 patches to skin every 2 days if tolerated.   NOTE: Do not apply 100 g per hour fentanyl patch since insurance will not approve 100 g patch for you  . [DISCONTINUED] insulin lispro (HUMALOG) 100 UNIT/ML KiwkPen Inject into the skin.  . [DISCONTINUED] Oxycodone HCl 10 MG TABS Limit 1 tab by mouth 4-8 times per day for breakthrough pain if tolerated while wearing fentanyl patches   No facility-administered encounter medications on file as of 12/01/2018.     Activities of Daily Living In your present state of health, do you have any difficulty performing the following activities: 12/01/2018  Hearing? N  Comment declines hearing aids  Vision? N  Comment wears glasses  Difficulty concentrating or making decisions? N  Walking or climbing stairs? N  Dressing or bathing? N  Doing errands, shopping? N  Preparing Food and eating ? N  Using the Toilet? N  In the past six months, have you accidently leaked urine? N  Do you have problems with loss of bowel control? N  Managing your Medications? N  Managing your Finances? N    Housekeeping or managing your Housekeeping? N  Some recent data might be hidden    Patient Care Team: Steele Sizer, MD as PCP - General Lonia Farber, MD as Consulting Physician (Internal Medicine) Meade Maw, MD as Consulting Physician (Neurosurgery) Mohammed Kindle, MD as Referring Physician (Pain Medicine) Vladimir Crofts, MD as Consulting Physician (Neurology)    Assessment:   This is a routine wellness examination for [facility].  Exercise Activities and Dietary recommendations Current Exercise Habits: The patient does not participate in regular exercise at present, Exercise limited by: orthopedic condition(s)  Goals    . DIET - INCREASE WATER INTAKE     Recommend drinking 6-8 glasses of water per day       Fall Risk Fall Risk  12/01/2018 12/01/2018 06/30/2017 05/16/2017 04/04/2017  Falls in the past year? 0 0 No No No  Comment - - - - -  Number falls in past yr: 0 0 - - -  Injury with Fall? 0 0 - - -  Risk for fall due to : - - - - -  Follow up Falls prevention discussed - - - -   FALL RISK PREVENTION PERTAINING TO THE HOME:  Any stairs in or around the home? Yes  If so, do they handrails? Yes   Home free of loose throw rugs in walkways, pet beds, electrical cords, etc? Yes  Adequate lighting in your home to reduce risk of falls? Yes   ASSISTIVE DEVICES UTILIZED TO PREVENT FALLS:  Life alert? No  Use of a cane, walker or w/c? Yes  Grab bars in the bathroom? No  Shower chair or bench in shower? No  Elevated toilet seat or a handicapped toilet? No   DME ORDERS:  DME order needed?  No   TIMED UP AND GO:  Was the test performed? Yes .  Length of time to ambulate 10 feet: 6 sec.   GAIT:  Appearance of gait: Gait stead-fast and with  the use of an assistive device.    Education: Fall risk prevention has been discussed.  Intervention(s) required? No    Depression Screen PHQ 2/9 Scores 12/01/2018 03/10/2018 06/30/2017 05/16/2017  PHQ - 2 Score 0 2 0 0   PHQ- 9 Score 2 10 - -  Exception Documentation - - - -     Cognitive Function        Immunization History  Administered Date(s) Administered  . Influenza, High Dose Seasonal PF 06/25/2016, 06/30/2017  . Influenza,inj,Quad PF,6+ Mos 06/19/2015  . Influenza-Unspecified 06/08/2014  . Pneumococcal Conjugate-13 03/04/2016  . Pneumococcal Polysaccharide-23 06/02/2014  . Tdap 06/28/2008    Qualifies for Shingles Vaccine? Yes  . Due for Shingrix. Education has been provided regarding the importance of this vaccine. Pt has been advised to call insurance company to determine out of pocket expense. Advised may also receive vaccine at local pharmacy or Health Dept. Verbalized acceptance and understanding.  Tdap: Although this vaccine is not a covered service during a Wellness Exam, does the patient still wish to receive this vaccine today?  No .  Education has been provided regarding the importance of this vaccine. Advised may receive this vaccine at local pharmacy or Health Dept. Aware to provide a copy of the vaccination record if obtained from local pharmacy or Health Dept. Verbalized acceptance and understanding.  Flu Vaccine: Due for Flu vaccine. Does the patient want to receive this vaccine today?  Yes . Education has been provided regarding the importance of this vaccine but still declined. Advised may receive this vaccine at local pharmacy or Health Dept. Aware to provide a copy of the vaccination record if obtained from local pharmacy or Health Dept. Verbalized acceptance and understanding.  Pneumococcal Vaccine: Up to date  Screening Tests Health Maintenance  Topic Date Due  . OPHTHALMOLOGY EXAM  02/05/2018  . INFLUENZA VACCINE  04/30/2018  . FOOT EXAM  06/30/2018  . HEMOGLOBIN A1C  09/01/2018  . TETANUS/TDAP  12/01/2019 (Originally 06/28/2018)  . PNA vac Low Risk Adult (2 of 2 - PPSV23) 06/03/2019  . MAMMOGRAM  04/23/2020  . COLONOSCOPY  02/13/2021  . DEXA SCAN  Completed  .  Hepatitis C Screening  Completed    Cancer Screenings:  Colorectal Screening: Completed 02/14/11. Repeat every 10 years.  Mammogram: Completed 04/23/18. Repeat every year. Ordered today.   Bone Density: Completed 04/18/16. Results reflect OSTEOPENIA. Repeat every 2 years. Ordered today. Pt provided with contact information and advised to call to schedule appt.   Lung Cancer Screening: (Low Dose CT Chest recommended if Age 21-80 years, 30 pack-year currently smoking OR have quit w/in 15years.) does not qualify.   Additional Screening:  Hepatitis C Screening: does qualify; Completed 04/15/13  Vision Screening: Recommended annual ophthalmology exams for early detection of glaucoma and other disorders of the eye. Is the patient up to date with their annual eye exam?  Yes  Who is the provider or what is the name of the office in which the pt attends annual eye exams? [facility]  Dental Screening: Recommended annual dental exams for proper oral hygiene  Community Resource Referral:  CRR required this visit?  No      Plan:     I have personally reviewed and addressed the Medicare Annual Wellness questionnaire and have noted the following in the patient's chart:  A. Medical and social history B. Use of alcohol, tobacco or illicit drugs  C. Current medications and supplements D. Functional ability and status E.  Nutritional status F.  Physical activity G. Advance directives H. List of other physicians I.  Hospitalizations, surgeries, and ER visits in previous 12 months J.  [place] such as hearing and vision if needed, cognitive and depression L. Referrals and appointments   In addition, I have reviewed and discussed with patient certain preventive protocols, quality metrics, and best practice recommendations. A written personalized care plan for preventive services as well as general preventive health recommendations were provided to patient.   Signed,    [name], LPN Nurse Health Advisor   Nurse Notes: pt also being seen by Dr. Ancil Boozer today for follow up.

## 2018-12-01 NOTE — Patient Instructions (Addendum)
Lorraine Jones , Thank you for taking time to come for your Medicare Wellness Visit. I appreciate your ongoing commitment to your health goals. Please review the following plan we discussed and let me know if I can assist you in the future.   Screening recommendations/referrals: Colonoscopy: done 02/15/11 Repeat in 2022 Mammogram: done 04/23/18. Please call [phone] to schedule your mammogram and bone density screening.  Bone Density: done 04/18/16. Recommended yearly ophthalmology/optometry visit for glaucoma screening and checkup Recommended yearly dental visit for hygiene and checkup  Vaccinations: Influenza vaccine: done today  Pneumococcal vaccine: done 03/04/16 Tdap vaccine: done 06/28/2008 - due, please contact us if you get a cut or scrape Shingles vaccine: Shingrix discussed. Please contact your pharmacy for coverage information.   Advanced directives: Advance directive discussed with you today. I have provided a copy for you to complete at home and have notarized. Once this is complete please bring a copy in to our office so we can scan it into your chart.  Conditions/risks identified: Recommend drinking 6-8 glasses of water per day.   Next appointment: Please follow up in one year for your Medicare Annual Wellness visit.     Preventive Care 8 Years and Older, Female Preventive care refers to lifestyle choices and visits with your health care provider that can promote health and wellness. What does preventive care include?  A yearly physical exam. This is also called an annual well check.  Dental exams once or twice a year.  Routine eye exams. Ask your health care provider how often you should have your eyes checked.  Personal lifestyle choices, including:  Daily care of your teeth and gums.  Regular physical activity.  Eating a healthy diet.  Avoiding tobacco and drug use.  Limiting alcohol use.  Practicing safe sex.  Taking low-dose aspirin every day.  Taking  vitamin and mineral supplements as recommended by your health care provider. What happens during an annual well check? The services and screenings done by your health care provider during your annual well check will depend on your age, overall health, lifestyle risk factors, and family history of disease. Counseling  Your health care provider may ask you questions about your:  Alcohol use.  Tobacco use.  Drug use.  Emotional well-being.  Home and relationship well-being.  Sexual activity.  Eating habits.  History of falls.  Memory and ability to understand (cognition).  Work and work Statistician.  Reproductive health. Screening  You may have the following tests or measurements:  Height, weight, and BMI.  Blood pressure.  Lipid and cholesterol levels. These may be checked every 5 years, or more frequently if you are over 24 years old.  Skin check.  Lung cancer screening. You may have this screening every year starting at age 72 if you have a 30-pack-year history of smoking and currently smoke or have quit within the past 15 years.  Fecal occult blood test (FOBT) of the stool. You may have this test every year starting at age 54.  Flexible sigmoidoscopy or colonoscopy. You may have a sigmoidoscopy every 5 years or a colonoscopy every 10 years starting at age 19.  Hepatitis C blood test.  Hepatitis B blood test.  Sexually transmitted disease (STD) testing.  Diabetes screening. This is done by checking your blood sugar (glucose) after you have not eaten for a while (fasting). You may have this done every 1-3 years.  Bone density scan. This is done to screen for osteoporosis. You may have this done starting  at age 22.  Mammogram. This may be done every 1-2 years. Talk to your health care provider about how often you should have regular mammograms. Talk with your health care provider about your test results, treatment options, and if necessary, the need for more  tests. Vaccines  Your health care provider may recommend certain vaccines, such as:  Influenza vaccine. This is recommended every year.  Tetanus, diphtheria, and acellular pertussis (Tdap, Td) vaccine. You may need a Td booster every 10 years.  Zoster vaccine. You may need this after age 59.  Pneumococcal 13-valent conjugate (PCV13) vaccine. One dose is recommended after age 44.  Pneumococcal polysaccharide (PPSV23) vaccine. One dose is recommended after age 45. Talk to your health care provider about which screenings and vaccines you need and how often you need them. This information is not intended to replace advice given to you by your health care provider. Make sure you discuss any questions you have with your health care provider. Document Released: 10/13/2015 Document Revised: 06/05/2016 Document Reviewed: 07/18/2015 Elsevier Interactive Patient Education  2017 [place] Prevention in the Home Falls can cause injuries. They can happen to people of all ages. There are many things you can do to make your home safe and to help prevent falls. What can I do on the outside of my home?  Regularly fix the edges of walkways and driveways and fix any cracks.  Remove anything that might make you trip as you walk through a door, such as a raised step or threshold.  Trim any bushes or trees on the path to your home.  Use bright outdoor lighting.  Clear any walking paths of anything that might make someone trip, such as rocks or tools.  Regularly check to see if handrails are loose or broken. Make sure that both sides of any steps have handrails.  Any raised decks and porches should have guardrails on the edges.  Have any leaves, snow, or ice cleared regularly.  Use sand or salt on walking paths during winter.  Clean up any spills in your garage right away. This includes oil or grease spills. What can I do in the bathroom?  Use night lights.  Install grab bars by the  toilet and in the tub and shower. Do not use towel bars as grab bars.  Use non-skid mats or decals in the tub or shower.  If you need to sit down in the shower, use a plastic, non-slip stool.  Keep the floor dry. Clean up any water that spills on the floor as soon as it happens.  Remove soap buildup in the tub or shower regularly.  Attach bath mats securely with double-sided non-slip rug tape.  Do not have throw rugs and other things on the floor that can make you trip. What can I do in the bedroom?  Use night lights.  Make sure that you have a light by your bed that is easy to reach.  Do not use any sheets or blankets that are too big for your bed. They should not hang down onto the floor.  Have a firm chair that has side arms. You can use this for support while you get dressed.  Do not have throw rugs and other things on the floor that can make you trip. What can I do in the kitchen?  Clean up any spills right away.  Avoid walking on wet floors.  Keep items that you use a lot in easy-to-reach places.  If  you need to reach something above you, use a strong step stool that has a grab bar.  Keep electrical cords out of the way.  Do not use floor polish or wax that makes floors slippery. If you must use wax, use non-skid floor wax.  Do not have throw rugs and other things on the floor that can make you trip. What can I do with my stairs?  Do not leave any items on the stairs.  Make sure that there are handrails on both sides of the stairs and use them. Fix handrails that are broken or loose. Make sure that handrails are as long as the stairways.  Check any carpeting to make sure that it is firmly attached to the stairs. Fix any carpet that is loose or worn.  Avoid having throw rugs at the top or bottom of the stairs. If you do have throw rugs, attach them to the floor with carpet tape.  Make sure that you have a light switch at the top of the stairs and the bottom of  the stairs. If you do not have them, ask someone to add them for you. What else can I do to help prevent falls?  Wear shoes that:  Do not have high heels.  Have rubber bottoms.  Are comfortable and fit you well.  Are closed at the toe. Do not wear sandals.  If you use a stepladder:  Make sure that it is fully opened. Do not climb a closed stepladder.  Make sure that both sides of the stepladder are locked into place.  Ask someone to hold it for you, if possible.  Clearly mark and make sure that you can see:  Any grab bars or handrails.  First and last steps.  Where the edge of each step is.  Use tools that help you move around (mobility aids) if they are needed. These include:  Canes.  Walkers.  Scooters.  Crutches.  Turn on the lights when you go into a dark area. Replace any light bulbs as soon as they burn out.  Set up your furniture so you have a clear path. Avoid moving your furniture around.  If any of your floors are uneven, fix them.  If there are any pets around you, be aware of where they are.  Review your medicines with your doctor. Some medicines can make you feel dizzy. This can increase your chance of falling. Ask your doctor what other things that you can do to help prevent falls. This information is not intended to replace advice given to you by your health care provider. Make sure you discuss any questions you have with your health care provider. Document Released: 07/13/2009 Document Revised: 02/22/2016 Document Reviewed: 10/21/2014 Elsevier Interactive Patient Education  2017 [place].  Influenza (Flu) Vaccine (Inactivated or Recombinant): What You Need to Know 1. Why get vaccinated? Influenza vaccine can prevent influenza (flu). Flu is a contagious disease that spreads around the [place] every year, usually between October and May. Anyone can get the flu, but it is more dangerous for some people. Infants and young children, people  93 years of age and older, pregnant women, and people with certain health conditions or a weakened immune system are at greatest risk of flu complications. Pneumonia, bronchitis, sinus infections and ear infections are examples of flu-related complications. If you have a medical condition, such as heart disease, cancer or diabetes, flu can make it worse. Flu can cause fever and chills, sore throat, muscle  aches, fatigue, cough, headache, and runny or stuffy nose. Some people may have vomiting and diarrhea, though this is more common in children than adults. Each year thousands of people in the [place] die from flu, and many more are hospitalized. Flu vaccine prevents millions of illnesses and flu-related visits to the doctor each year. 2. Influenza vaccine CDC recommends everyone 50 months of age and older get vaccinated every flu season. Children 6 months through 54 years of age may need 2 doses during a single flu season. Everyone else needs only 1 dose each flu season. It takes about 2 weeks for protection to develop after vaccination. There are many flu viruses, and they are always changing. Each year a new flu vaccine is made to protect against three or four viruses that are likely to cause disease in the upcoming flu season. Even when the vaccine doesn't exactly match these viruses, it may still provide some protection. Influenza vaccine does not cause flu. Influenza vaccine may be given at the same time as other vaccines. 3. Talk with your health care provider Tell your vaccine provider if the person getting the vaccine:  Has had an allergic reaction after a previous dose of influenza vaccine, or has any severe, life-threatening allergies.  Has ever had Guillain-Barr Syndrome (also called GBS). In some cases, your health care provider may decide to postpone influenza vaccination to a future visit. People with minor illnesses, such as a cold, may be vaccinated. People who are moderately  or severely ill should usually wait until they recover before getting influenza vaccine. Your health care provider can give you more information. 4. Risks of a vaccine reaction  Soreness, redness, and swelling where shot is given, fever, muscle aches, and headache can happen after influenza vaccine.  There may be a very small increased risk of Guillain-Barr Syndrome (GBS) after inactivated influenza vaccine (the flu shot). Young children who get the flu shot along with pneumococcal vaccine (PCV13), and/or DTaP vaccine at the same time might be slightly more likely to have a seizure caused by fever. Tell your health care provider if a child who is getting flu vaccine has ever had a seizure. People sometimes faint after medical procedures, including vaccination. Tell your provider if you feel dizzy or have vision changes or ringing in the ears. As with any medicine, there is a very remote chance of a vaccine causing a severe allergic reaction, other serious injury, or death. 5. What if there is a serious problem? An allergic reaction could occur after the vaccinated person leaves the clinic. If you see signs of a severe allergic reaction (hives, swelling of the face and throat, difficulty breathing, a fast heartbeat, dizziness, or weakness), call 9-1-1 and get the person to the nearest hospital. For other signs that concern you, call your health care provider. Adverse reactions should be reported to the Vaccine Adverse Event Reporting System (VAERS). Your health care provider will usually file this report, or you can do it yourself. Visit the VAERS website at www.vaers.SamedayNews.es or call 770-409-4502.VAERS is only for reporting reactions, and VAERS staff do not give medical advice. 6. The National Vaccine Injury Compensation Program The [employer] Vaccine Injury Compensation Program (VICP) is a federal program that was created to compensate people who may have been injured by certain vaccines. Visit the  VICP website at GoldCloset.com.ee or call 303-651-9700 to learn about the program and about filing a claim. There is a time limit to file a claim for compensation. 7. How  can I learn more?  Ask your healthcare provider.  Call your local or state health department.  Contact the Centers for Disease Control and Prevention (CDC): ? Call 646-307-6674 (1-800-CDC-INFO) or ? Visit CDC's https://gibson.com/ Vaccine Information Statement (Interim) Inactivated Influenza Vaccine (05/14/2018) This information is not intended to replace advice given to you by your health care provider. Make sure you discuss any questions you have with your health care provider. Document Released: 07/11/2006 Document Revised: 05/18/2018 Document Reviewed: 05/18/2018 Elsevier Interactive Patient Education  2019 [employer].

## 2018-12-02 DIAGNOSIS — M47897 Other spondylosis, lumbosacral region: Secondary | ICD-10-CM | POA: Diagnosis not present

## 2018-12-02 DIAGNOSIS — M259 Joint disorder, unspecified: Secondary | ICD-10-CM | POA: Diagnosis not present

## 2018-12-02 DIAGNOSIS — G894 Chronic pain syndrome: Secondary | ICD-10-CM | POA: Diagnosis not present

## 2018-12-02 DIAGNOSIS — M5416 Radiculopathy, lumbar region: Secondary | ICD-10-CM | POA: Diagnosis not present

## 2018-12-02 DIAGNOSIS — M48062 Spinal stenosis, lumbar region with neurogenic claudication: Secondary | ICD-10-CM | POA: Diagnosis not present

## 2019-01-04 DIAGNOSIS — G894 Chronic pain syndrome: Secondary | ICD-10-CM | POA: Diagnosis not present

## 2019-01-24 ENCOUNTER — Other Ambulatory Visit: Payer: Self-pay | Admitting: Family Medicine

## 2019-01-24 DIAGNOSIS — F331 Major depressive disorder, recurrent, moderate: Secondary | ICD-10-CM

## 2019-01-29 ENCOUNTER — Other Ambulatory Visit: Payer: Self-pay

## 2019-01-29 NOTE — Patient Outreach (Signed)
[facility]) Care Management  01/29/2019  Vienna Folden [facility] 1951-01-04 [id number]   Medication Adherence call to Mrs. [place] Compliant Voice message left with a call back number. Mrs. Weiskopf is showing past due under [place].   [place] Management Direct Dial [phone]  Fax [phone] [email or web address]

## 2019-02-01 DIAGNOSIS — M5416 Radiculopathy, lumbar region: Secondary | ICD-10-CM | POA: Diagnosis not present

## 2019-02-01 DIAGNOSIS — G894 Chronic pain syndrome: Secondary | ICD-10-CM | POA: Diagnosis not present

## 2019-02-01 DIAGNOSIS — M5441 Lumbago with sciatica, right side: Secondary | ICD-10-CM | POA: Diagnosis not present

## 2019-02-01 DIAGNOSIS — Z5181 Encounter for therapeutic drug level monitoring: Secondary | ICD-10-CM | POA: Diagnosis not present

## 2019-02-01 DIAGNOSIS — M5136 Other intervertebral disc degeneration, lumbar region: Secondary | ICD-10-CM | POA: Diagnosis not present

## 2019-02-03 ENCOUNTER — Other Ambulatory Visit: Payer: Self-pay | Admitting: Family Medicine

## 2019-02-03 DIAGNOSIS — E1165 Type 2 diabetes mellitus with hyperglycemia: Principal | ICD-10-CM

## 2019-02-03 DIAGNOSIS — E1143 Type 2 diabetes mellitus with diabetic autonomic (poly)neuropathy: Secondary | ICD-10-CM

## 2019-02-03 DIAGNOSIS — IMO0002 Reserved for concepts with insufficient information to code with codable children: Secondary | ICD-10-CM

## 2019-02-03 NOTE — Telephone Encounter (Signed)
Refill request for general medication. Reglan   Last office visit 12/01/2018   Follow up on 06/03/2019

## 2019-03-01 DIAGNOSIS — M47897 Other spondylosis, lumbosacral region: Secondary | ICD-10-CM | POA: Diagnosis not present

## 2019-03-01 DIAGNOSIS — M5416 Radiculopathy, lumbar region: Secondary | ICD-10-CM | POA: Diagnosis not present

## 2019-03-01 DIAGNOSIS — Z5181 Encounter for therapeutic drug level monitoring: Secondary | ICD-10-CM | POA: Diagnosis not present

## 2019-03-01 DIAGNOSIS — M792 Neuralgia and neuritis, unspecified: Secondary | ICD-10-CM | POA: Diagnosis not present

## 2019-03-01 DIAGNOSIS — M48062 Spinal stenosis, lumbar region with neurogenic claudication: Secondary | ICD-10-CM | POA: Diagnosis not present

## 2019-03-01 DIAGNOSIS — M5441 Lumbago with sciatica, right side: Secondary | ICD-10-CM | POA: Diagnosis not present

## 2019-03-01 DIAGNOSIS — G8929 Other chronic pain: Secondary | ICD-10-CM | POA: Diagnosis not present

## 2019-03-01 DIAGNOSIS — M4807 Spinal stenosis, lumbosacral region: Secondary | ICD-10-CM | POA: Diagnosis not present

## 2019-03-01 DIAGNOSIS — M5136 Other intervertebral disc degeneration, lumbar region: Secondary | ICD-10-CM | POA: Diagnosis not present

## 2019-03-15 ENCOUNTER — Other Ambulatory Visit: Payer: Self-pay

## 2019-03-15 NOTE — Patient Outreach (Signed)
[facility]) Care Management  03/15/2019  [name] [facility] [birth year] [id number]   Medication Adherence call to Lorraine Jones Telephone call to Patient regarding Medication Adherence unable to reach patient Lorraine Jones is showing past due on Metformin 1000 mg under [place].   [place] Management Direct Dial [phone]  Fax [phone] [email or web address]

## 2019-03-18 DIAGNOSIS — I10 Essential (primary) hypertension: Secondary | ICD-10-CM | POA: Diagnosis not present

## 2019-03-18 DIAGNOSIS — E1159 Type 2 diabetes mellitus with other circulatory complications: Secondary | ICD-10-CM | POA: Diagnosis not present

## 2019-03-18 DIAGNOSIS — E1169 Type 2 diabetes mellitus with other specified complication: Secondary | ICD-10-CM | POA: Diagnosis not present

## 2019-03-18 DIAGNOSIS — E1165 Type 2 diabetes mellitus with hyperglycemia: Secondary | ICD-10-CM | POA: Diagnosis not present

## 2019-03-18 DIAGNOSIS — Z794 Long term (current) use of insulin: Secondary | ICD-10-CM | POA: Diagnosis not present

## 2019-03-29 DIAGNOSIS — M5136 Other intervertebral disc degeneration, lumbar region: Secondary | ICD-10-CM | POA: Diagnosis not present

## 2019-03-29 DIAGNOSIS — Z5181 Encounter for therapeutic drug level monitoring: Secondary | ICD-10-CM | POA: Diagnosis not present

## 2019-03-29 DIAGNOSIS — M5416 Radiculopathy, lumbar region: Secondary | ICD-10-CM | POA: Diagnosis not present

## 2019-03-29 DIAGNOSIS — M5441 Lumbago with sciatica, right side: Secondary | ICD-10-CM | POA: Diagnosis not present

## 2019-03-31 IMAGING — MR MR LUMBAR SPINE W/O CM
4 of 5 series · 24 of 48 positions shown · non-contrast
Comparison: CT abdomen and pelvis 06/01/2014. MRI lumbar spine
04/16/2011.

CLINICAL DATA: History of lumbar surgery in the 4333's. Low back
pain on the left radiating into the left leg to the knee with
numbness and burning. Symptoms for 2-3 months. No known injury.

EXAM:
MRI LUMBAR SPINE WITHOUT CONTRAST
TECHNIQUE: Multiplanar, multisequence MR imaging of the lumbar spine was
performed. No intravenous contrast was administered.

[Series 2: T2 · sagittal · 4.0mm · 0.81mm/px · 7 of 15 slices shown (1 of 2)]
[im 1/15]
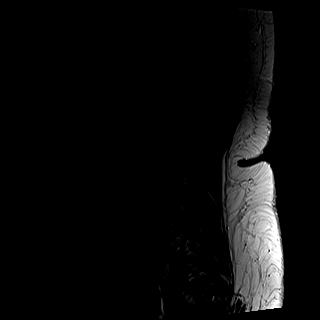
[im 3/15]
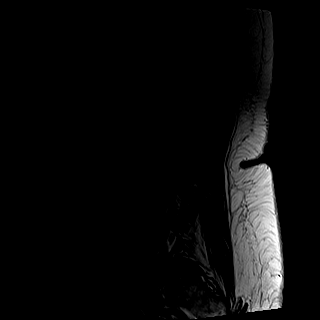
[im 5/15]
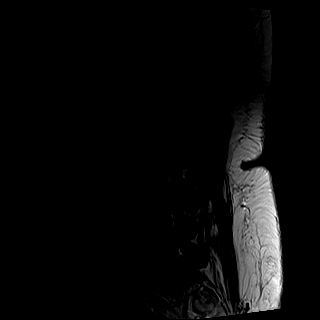
[im 8/15]
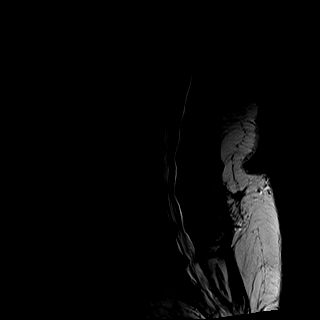
[im 10/15]
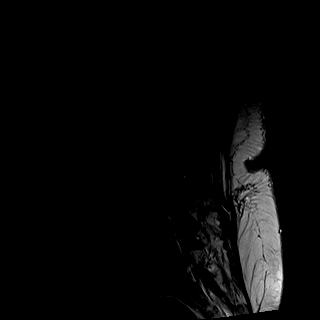
[im 12/15]
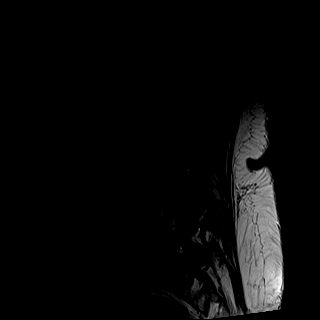
[im 15/15]
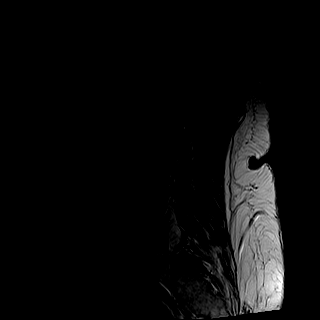

[Series 3: T1 · sagittal · 4.0mm · 0.41mm/px · 6 of 15 slices shown (1 of 2)]
[im 1/15]
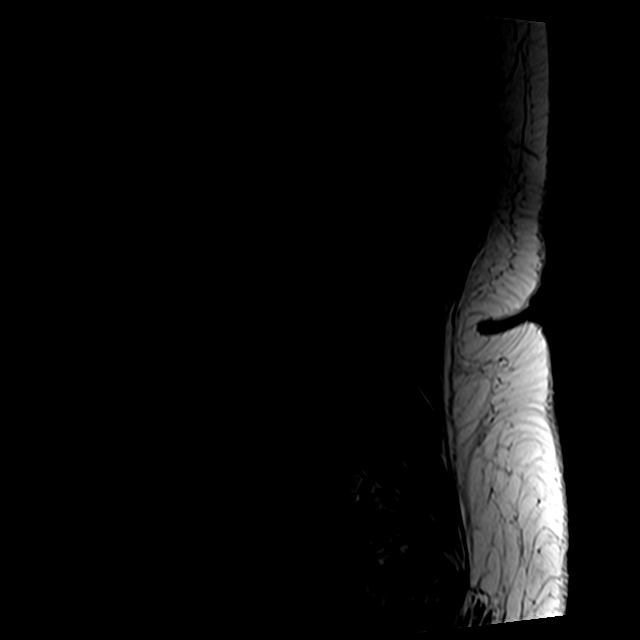
[im 3/15]
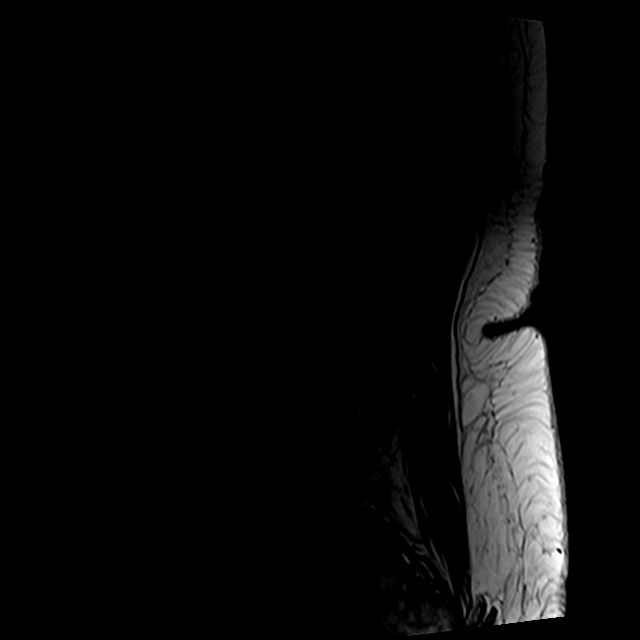
[im 5/15]
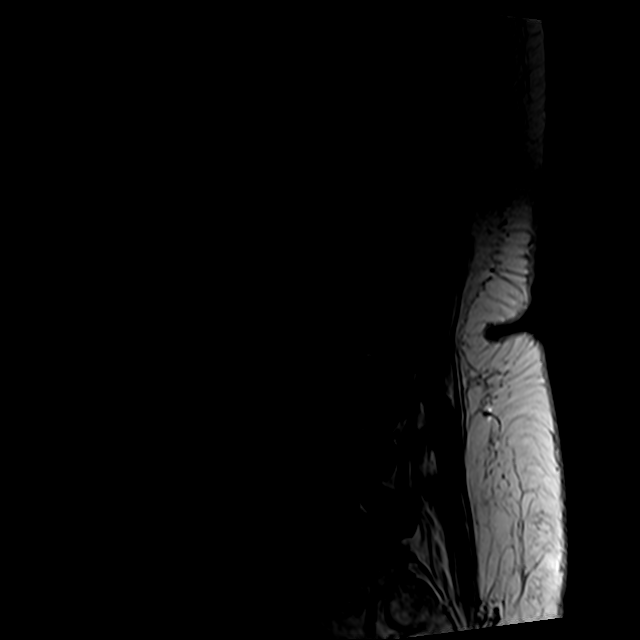
[im 8/15]
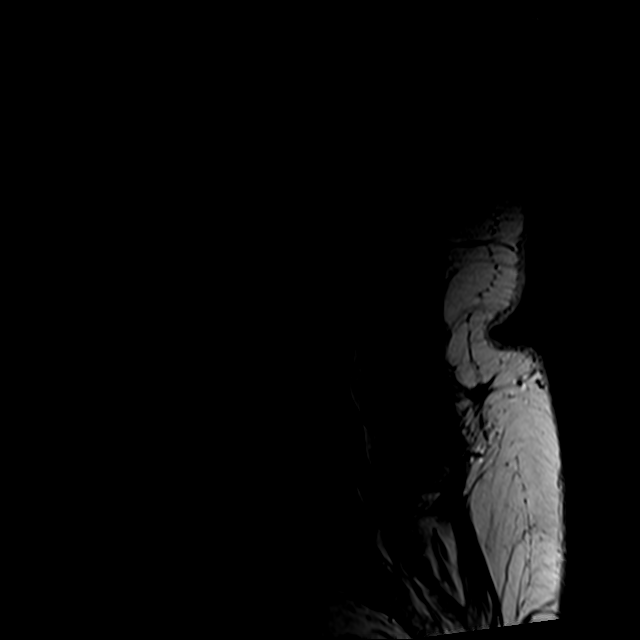
[im 10/15]
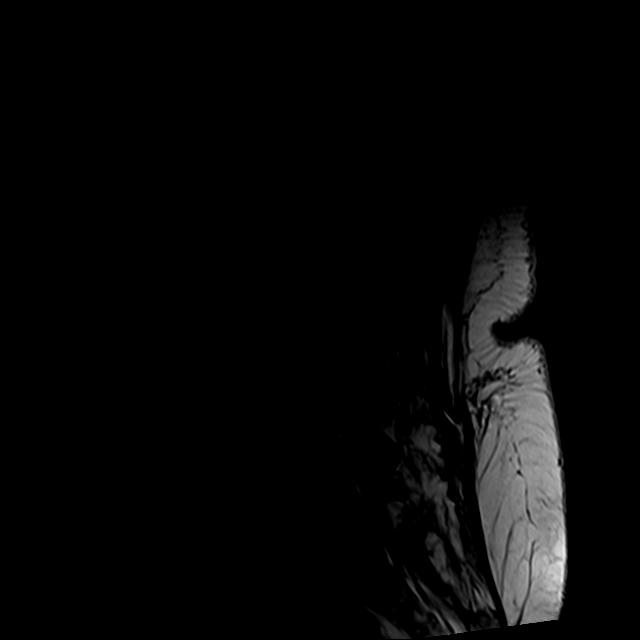
[im 12/15]
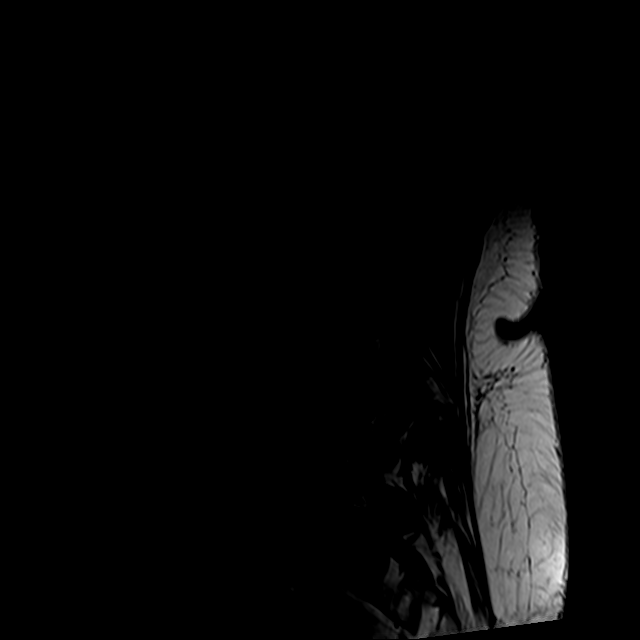

[Series 5: T2 · axial · 4.0mm · 0.78mm/px · z∈[-94,+87]mm · 8 of 33 slices shown (2 of 2)]
[im 1/33]
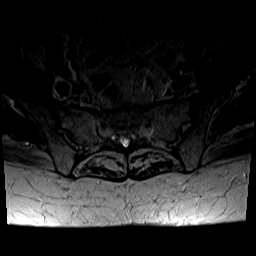
[im 5/33]
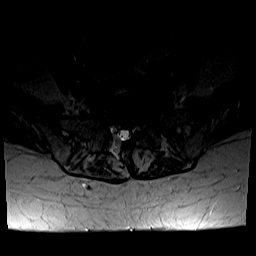
[im 10/33]
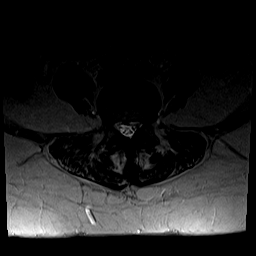
[im 15/33]
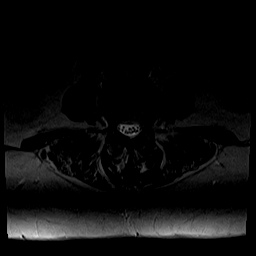
[im 18/33]
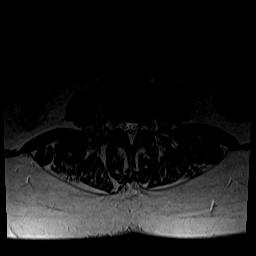
[im 23/33]
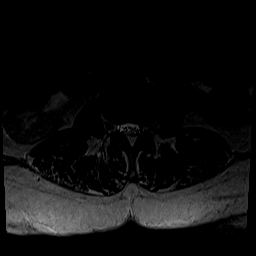
[im 28/33]
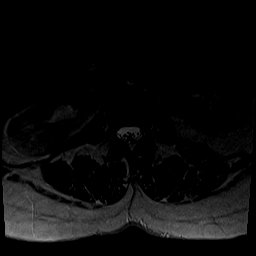
[im 33/33]
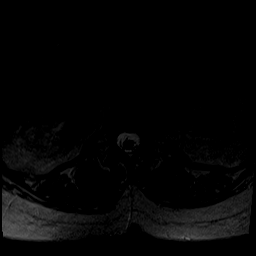

[Series 6: T1 · axial · 4.0mm · 0.39mm/px · z∈[-74,+62]mm · 3 of 33 slices shown (2 of 2)]
[im 5/33]
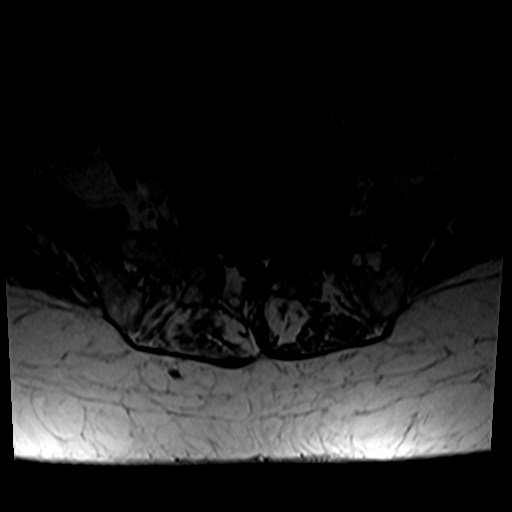
[im 18/33]
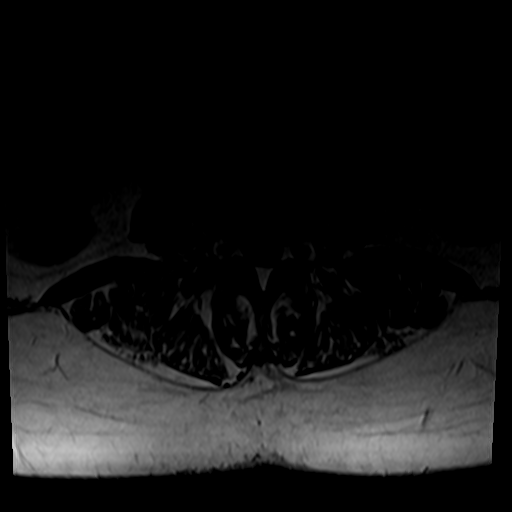
[im 28/33]
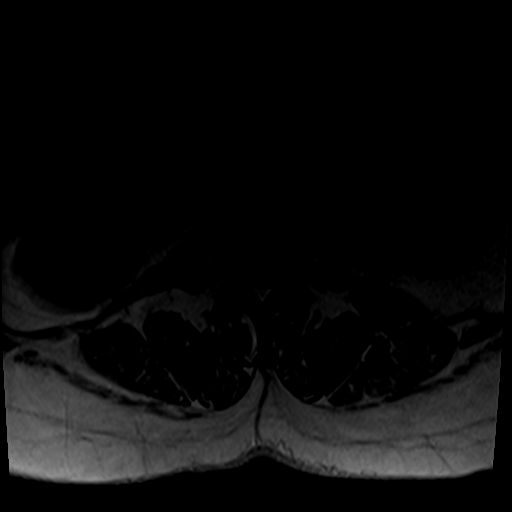

[24 of 48 positions shown; findings below may reference images not displayed]

FINDINGS: Segmentation:  Standard.

Alignment:  Maintained.

Vertebrae: No fracture or worrisome lesion. Degenerative endplate
signal change at L5-S1 is stable in appearance.

Conus medullaris: Extends to the L2 level and appears normal.

Paraspinal and other soft tissues: Negative.

Disc levels:

T10-11, T11-12 and T12-L1 are imaged in the sagittal plane only.
Central and eccentric to the right protrusions are seen at T10-11
and T11-12, more prominent at T10-11 where it has increased in size
since the prior MRI. No cord deformity or signal abnormality is
identified.

L1-2: Shallow right paracentral protrusion without central canal or
foraminal narrowing.

L2-3: Minimal disc bulge without central canal or foraminal
narrowing, unchanged.

L3-4: Moderate facet degenerative change, shallow disc bulge and
somewhat prominent epidural fat are unchanged in appearance. There
is mild central canal narrowing. The foramina are open.

L4-5: There has been progression of disease at this level. The
patient has a central disc protrusion with cephalad extension into
the right subarticular recess. The disc causes right worse than left
subarticular recess narrowing and could impact either descending L5
root. There is moderate central canal stenosis overall. The foramina
are open.

L5-S1: Status post right laminectomy. Shallow disc bulge and
endplate spur are identified. There is some ligamentum flavum
thickening on the left. The central canal appears open. Mild
bilateral foraminal narrowing is seen. The appearance of this level
is unchanged.
IMPRESSION: Progressive spondylosis at L4-5 where there is a central disc
protrusion with cephalad extension into the right subarticular
recess. Right worse than left subarticular recess narrowing is
identified. Either descending L5 root could be impacted. Moderate
central canal stenosis is present overall.

Status post right laminectomy as seen on the prior exam. Mild
bilateral foraminal narrowing at this level is unchanged.

No change in mild central canal narrowing L3-4.

Right paracentral protrusion at T10-11 is larger than on the prior
MRI and imaged in the sagittal plane only. The distal cord does not
appear deformed and no cord signal abnormality is seen.

## 2019-04-05 ENCOUNTER — Other Ambulatory Visit: Payer: Self-pay

## 2019-04-05 DIAGNOSIS — Z9841 Cataract extraction status, right eye: Secondary | ICD-10-CM | POA: Diagnosis not present

## 2019-04-05 DIAGNOSIS — E113293 Type 2 diabetes mellitus with mild nonproliferative diabetic retinopathy without macular edema, bilateral: Secondary | ICD-10-CM | POA: Diagnosis not present

## 2019-04-05 DIAGNOSIS — Z9842 Cataract extraction status, left eye: Secondary | ICD-10-CM | POA: Diagnosis not present

## 2019-04-05 DIAGNOSIS — H40023 Open angle with borderline findings, high risk, bilateral: Secondary | ICD-10-CM | POA: Diagnosis not present

## 2019-04-05 DIAGNOSIS — H40053 Ocular hypertension, bilateral: Secondary | ICD-10-CM | POA: Diagnosis not present

## 2019-04-05 DIAGNOSIS — H524 Presbyopia: Secondary | ICD-10-CM | POA: Diagnosis not present

## 2019-04-05 LAB — HM DIABETES EYE EXAM

## 2019-04-05 NOTE — Patient Outreach (Signed)
[facility]) Care Management  04/05/2019  Ari Engelbrecht [facility] [birth year] [id number]   Medication Adherence call to Mrs. [place] Compliant Voice message left with a call back number. Mrs. Abbot is showing past due under [place].   [place] Management Direct Dial [phone]  Fax [phone] [email or web address]

## 2019-04-06 DIAGNOSIS — E113293 Type 2 diabetes mellitus with mild nonproliferative diabetic retinopathy without macular edema, bilateral: Secondary | ICD-10-CM | POA: Insufficient documentation

## 2019-04-26 DIAGNOSIS — M5416 Radiculopathy, lumbar region: Secondary | ICD-10-CM | POA: Diagnosis not present

## 2019-04-26 DIAGNOSIS — Z5181 Encounter for therapeutic drug level monitoring: Secondary | ICD-10-CM | POA: Diagnosis not present

## 2019-04-26 DIAGNOSIS — M5136 Other intervertebral disc degeneration, lumbar region: Secondary | ICD-10-CM | POA: Diagnosis not present

## 2019-04-26 DIAGNOSIS — M5441 Lumbago with sciatica, right side: Secondary | ICD-10-CM | POA: Diagnosis not present

## 2019-05-11 ENCOUNTER — Ambulatory Visit (INDEPENDENT_AMBULATORY_CARE_PROVIDER_SITE_OTHER): Payer: Medicare Other | Admitting: Family Medicine

## 2019-05-11 ENCOUNTER — Encounter: Payer: Self-pay | Admitting: Family Medicine

## 2019-05-11 ENCOUNTER — Other Ambulatory Visit: Payer: Self-pay

## 2019-05-11 DIAGNOSIS — L2389 Allergic contact dermatitis due to other agents: Secondary | ICD-10-CM

## 2019-05-11 MED ORDER — FAMOTIDINE 20 MG PO TABS: 20.0000 mg | ORAL_TABLET | Freq: Two times a day (BID) | ORAL | 0 refills | Status: DC

## 2019-05-11 MED ORDER — HYDROXYZINE HCL 10 MG PO TABS: 10.0000 mg | ORAL_TABLET | Freq: Three times a day (TID) | ORAL | 0 refills | Status: DC | PRN

## 2019-05-11 MED ORDER — TRIAMCINOLONE ACETONIDE 0.1 % EX CREA: 1.0000 "application " | TOPICAL_CREAM | Freq: Two times a day (BID) | CUTANEOUS | 0 refills | Status: DC

## 2019-05-11 NOTE — Progress Notes (Signed)
Name: Lorraine Jones   MRN: [id number]    DOB: [birth year]   Date:05/11/2019       Progress Note  Subjective  Chief Complaint  Chief Complaint  Patient presents with  . Eczema    on right arm, dry itchy    I connected with  Kristine Royal Vicknair  on 05/11/19 at  9:40 AM EDT by a video enabled telemedicine application and verified that I am speaking with the correct person using two identifiers.  I discussed the limitations of evaluation and management by telemedicine and the availability of in person appointments. The patient expressed understanding and agreed to proceed. Staff also discussed with the patient that there may be a patient responsible charge related to this service. Patient Location: Home Provider Location: Home Additional Individuals present: None  HPI  Pt presents with concern for rash on the RUE.  She notes she gets this rash every summer around this time, but this is the first time the rash has been this bad that it takes up her entire arm.  She has tried benadryl, calamine lotion, and moisturizer without relief.  She has been very itchy.  No heat or drainage, no fevers/chills, swelling, or redness.  Patient Active Problem List   Diagnosis Date Noted  . Morbid obesity ([place]) 12/01/2018  . Primary osteoarthritis of both hips 02/06/2018  . Hypertension associated with diabetes ([place]) 07/23/2017  . Hyperlipidemia due to type 2 diabetes mellitus ([place]) 07/23/2017  . Major depression, recurrent ([place]) 06/25/2016  . Osteopenia 04/18/2016  . Gastroesophageal reflux disease without esophagitis 05/12/2015  . Chronic pain 05/12/2015  . Hyperlipidemia 05/12/2015  . Lumbar post-laminectomy syndrome 02/27/2015  . Neuropathy due to secondary diabetes ([place]) 02/27/2015  . Spinal stenosis, lumbar region, with neurogenic claudication 02/27/2015  . Sacroiliac joint dysfunction of both sides 02/27/2015  . Degenerative joint disease (DJD) of hip 02/27/2015  . DDD (degenerative disc disease),  cervical 02/27/2015  . Bilateral occipital neuralgia 02/27/2015  . Chronic constipation 06/02/2014  . Hyponatremia 06/01/2014  . Hypertension, benign 06/01/2014    Social History   Tobacco Use  . Smoking status: Current Every Day Smoker    Packs/day: 0.50    Years: 20.00    Pack years: 10.00    Types: Cigarettes  . Smokeless tobacco: Never Used  . Tobacco comment: she is cutting down   Substance Use Topics  . Alcohol use: No    Alcohol/week: 0.0 standard drinks     Current Outpatient Medications:  .  aspirin EC 81 MG tablet, Take 81 mg by mouth daily., Disp: , Rfl:  .  Blood Glucose Monitoring Suppl (ONETOUCH VERIO) w/Device KIT, USE TO TEST BLOOD SUGAR, Disp: , Rfl:  .  carvedilol (COREG) 3.125 MG tablet, Take 1 tablet (3.125 mg total) by mouth 2 (two) times daily., Disp: 180 tablet, Rfl: 1 .  Continuous Blood Gluc Receiver (FREESTYLE LIBRE 14 DAY READER) DEVI, Use 1 each as needed .  Use to test blood sugar, Disp: , Rfl:  .  Continuous Blood Gluc Sensor (FREESTYLE LIBRE 14 DAY SENSOR) MISC, Use 1 each every 14 (fourteen) days, Disp: , Rfl:  .  dorzolamide-timolol (COSOPT) 22.3-6.8 MG/ML ophthalmic solution, Place 1 drop into both eyes 2 (two) times daily., Disp: , Rfl:  .  fentaNYL (DURAGESIC - DOSED MCG/HR) 75 MCG/HR, Apply 2 patches to skin every 2 days if tolerated.   NOTE: Do not apply 100 g per hour fentanyl patch since insurance will not approve 100 g  patch for you, Disp: 30 patch, Rfl: 0 .  glucose blood (ONE TOUCH ULTRA TEST) test strip, 1 strip by Other route 2 (two) times daily., Disp: , Rfl:  .  metFORMIN (GLUCOPHAGE) 1000 MG tablet, Take 1 tablet (1,000 mg total) by mouth 2 (two) times daily with a meal., Disp: 180 tablet, Rfl: 1 .  metoCLOPramide (REGLAN) 5 MG tablet, TAKE 1 TABLET BY MOUTH THREE TIMES DAILY, Disp: 270 tablet, Rfl: 0 .  Multiple Vitamins-Minerals (MULTIVITAMIN WITH MINERALS) tablet, Take 1 tablet by mouth daily., Disp: , Rfl:  .  NOVOLIN N RELION  100 UNIT/ML injection, INJECT 28 UNITS SUBCUTANEOUSLY TWO TIMES DAILY., Disp: , Rfl:  .  NOVOLIN R RELION 100 UNIT/ML injection, INJECT 20 UNITS SUBCUTANEOUSLY THREE TIMES DAILY BEFORE MEAL(S) (ADD SLIDING SCALE AS NECESSARY), Disp: , Rfl:  .  Olmesartan-amLODIPine-HCTZ (TRIBENZOR) 40-10-25 MG TABS, Take 1 tablet by mouth daily., Disp: 90 tablet, Rfl: 1 .  Oxycodone HCl 20 MG TABS, LIMIT 1/2 TO 1 TABLET BY MOUTH 3 TO 5 TIMES PER DAY IF TOLERATED *NOTE TABLET IS TWICE AS STRONG*, Disp: , Rfl:  .  polyethylene glycol (MIRALAX / GLYCOLAX) packet, Take 17 g by mouth daily., Disp: 14 each, Rfl: 0 .  pravastatin (PRAVACHOL) 40 MG tablet, Take 1 tablet (40 mg total) by mouth daily., Disp: 90 tablet, Rfl: 1 .  venlafaxine XR (EFFEXOR-XR) 150 MG 24 hr capsule, TAKE ONE CAPSULE BY MOUTH ONCE DAILY FOR DEPRESSION, Disp: 90 capsule, Rfl: 1  Allergies  Allergen Reactions  . Diclofenac Sodium Swelling  . Naproxen Sodium Swelling  . Etodolac Swelling  . Gabapentin Swelling  . Naproxen Swelling    I personally reviewed active problem list, medication list, allergies, notes from last encounter, lab results with the patient/caregiver today.  ROS  Constitutional: Negative for fever or weight change.  Respiratory: Negative for cough and shortness of breath.   Cardiovascular: Negative for chest pain or palpitations.  Gastrointestinal: Negative for abdominal pain, no bowel changes.  Musculoskeletal: Negative for gait problem or joint swelling.  Skin: Positive for rash.  Neurological: Negative for dizziness or headache.  No other specific complaints in a complete review of systems (except as listed in HPI above).  Objective  Virtual encounter, vitals not obtained.  There is no height or weight on file to calculate BMI.  Nursing Note and Vital Signs reviewed.  Physical Exam  Constitutional: Patient appears well-developed and well-nourished. No distress.  HENT: Head: Normocephalic and atraumatic.   Neck: Normal range of motion. Pulmonary/Chest: Effort normal. No respiratory distress. Speaking in complete sentences Neurological: Pt is alert and oriented to person, place, and time. Coordination, speech are normal.  Psychiatric: Patient has a normal mood and affect. behavior is normal. Judgment and thought content normal. Skin: There is a flesh colored papular rash across the lateral aspect of the right upper and lower arm, no hand or palm involvement.  No erythema or excoriation, no edema.  No results found for this or any previous visit (from the past 72 hour(s)).  Assessment & Plan  1. Allergic contact dermatitis due to other agents - famotidine (PEPCID) 20 MG tablet; Take 1 tablet (20 mg total) by mouth 2 (two) times daily.  Dispense: 14 tablet; Refill: 0 - hydrOXYzine (ATARAX/VISTARIL) 10 MG tablet; Take 1 tablet (10 mg total) by mouth every 8 (eight) hours as needed for itching.  Dispense: 20 tablet; Refill: 0 - triamcinolone cream (KENALOG) 0.1 %; Apply 1 application topically 2 (two) times daily.  Dispense: 30 g; Refill: 0  -Red flags and when to present for emergency care or RTC including fever >101.60F, chest pain, shortness of breath, new/worsening/un-resolving symptoms, reviewed with patient at time of visit. Follow up and care instructions discussed and provided in AVS. - I discussed the assessment and treatment plan with the patient. The patient was provided an opportunity to ask questions and all were answered. The patient agreed with the plan and demonstrated an understanding of the instructions.  I provided 14 minutes of non-face-to-face time during this encounter.  Hubbard Hartshorn, FNP

## 2019-05-11 NOTE — Patient Outreach (Signed)
[facility]) Care Management  05/11/2019  Lorraine Jones [facility] 11-06-1950 [id number]   Medication Adherence call to Lorraine Jones [place] Compliant Voice message left with a call back number. Lorraine Jones is showing past due on Pravastatin 40 mg under [place].   [place] Management Direct Dial [phone]  Fax [phone] [email or web address]

## 2019-05-24 DIAGNOSIS — M5136 Other intervertebral disc degeneration, lumbar region: Secondary | ICD-10-CM | POA: Diagnosis not present

## 2019-05-24 DIAGNOSIS — M5441 Lumbago with sciatica, right side: Secondary | ICD-10-CM | POA: Diagnosis not present

## 2019-05-24 DIAGNOSIS — Z5181 Encounter for therapeutic drug level monitoring: Secondary | ICD-10-CM | POA: Diagnosis not present

## 2019-05-24 DIAGNOSIS — M5416 Radiculopathy, lumbar region: Secondary | ICD-10-CM | POA: Diagnosis not present

## 2019-06-01 ENCOUNTER — Other Ambulatory Visit: Payer: Self-pay

## 2019-06-01 NOTE — Patient Outreach (Signed)
[facility]) Care Management  06/01/2019  Lorraine Jones [facility] September 05, 1951 [id number]   Medication Adherence call to Lorraine Jones Compliant Voice message left with a call back number. Lorraine Jones is showing past due on Pravastatin 40 mg and Olmesartan/Amlodipine/Hctz 40/10/25 mg under [place].  [place] Management Direct Dial [phone]  Fax [phone] [name]

## 2019-06-03 ENCOUNTER — Ambulatory Visit: Payer: Medicare Other | Admitting: Family Medicine

## 2019-06-18 ENCOUNTER — Ambulatory Visit
Admission: RE | Admit: 2019-06-18 | Discharge: 2019-06-18 | Disposition: A | Discharge status: Home / Self Care | Payer: Medicare Other | Source: Ambulatory Visit | Attending: Family Medicine | Admitting: Family Medicine

## 2019-06-18 DIAGNOSIS — Z1231 Encounter for screening mammogram for malignant neoplasm of breast: Secondary | ICD-10-CM

## 2019-06-18 DIAGNOSIS — M85852 Other specified disorders of bone density and structure, left thigh: Secondary | ICD-10-CM | POA: Diagnosis not present

## 2019-06-18 DIAGNOSIS — Z780 Asymptomatic menopausal state: Secondary | ICD-10-CM

## 2019-06-21 DIAGNOSIS — G894 Chronic pain syndrome: Secondary | ICD-10-CM | POA: Diagnosis not present

## 2019-06-23 DIAGNOSIS — M4807 Spinal stenosis, lumbosacral region: Secondary | ICD-10-CM | POA: Diagnosis not present

## 2019-06-23 DIAGNOSIS — M48062 Spinal stenosis, lumbar region with neurogenic claudication: Secondary | ICD-10-CM | POA: Diagnosis not present

## 2019-06-23 DIAGNOSIS — G8929 Other chronic pain: Secondary | ICD-10-CM | POA: Diagnosis not present

## 2019-06-23 DIAGNOSIS — M47897 Other spondylosis, lumbosacral region: Secondary | ICD-10-CM | POA: Diagnosis not present

## 2019-06-23 DIAGNOSIS — M792 Neuralgia and neuritis, unspecified: Secondary | ICD-10-CM | POA: Diagnosis not present

## 2019-06-23 DIAGNOSIS — G894 Chronic pain syndrome: Secondary | ICD-10-CM | POA: Diagnosis not present

## 2019-06-28 ENCOUNTER — Other Ambulatory Visit: Payer: Self-pay

## 2019-06-28 ENCOUNTER — Encounter: Payer: Self-pay | Admitting: Family Medicine

## 2019-06-28 ENCOUNTER — Ambulatory Visit (INDEPENDENT_AMBULATORY_CARE_PROVIDER_SITE_OTHER): Payer: Medicare Other | Admitting: Family Medicine

## 2019-06-28 VITALS — BP 120/60 | HR 90 | Temp 96.9°F | Resp 16 | Ht 61.0 in | Wt 186.2 lb

## 2019-06-28 DIAGNOSIS — E1129 Type 2 diabetes mellitus with other diabetic kidney complication: Secondary | ICD-10-CM

## 2019-06-28 DIAGNOSIS — E1143 Type 2 diabetes mellitus with diabetic autonomic (poly)neuropathy: Secondary | ICD-10-CM | POA: Diagnosis not present

## 2019-06-28 DIAGNOSIS — I10 Essential (primary) hypertension: Secondary | ICD-10-CM | POA: Diagnosis not present

## 2019-06-28 DIAGNOSIS — L309 Dermatitis, unspecified: Secondary | ICD-10-CM

## 2019-06-28 DIAGNOSIS — F325 Major depressive disorder, single episode, in full remission: Secondary | ICD-10-CM

## 2019-06-28 DIAGNOSIS — E1165 Type 2 diabetes mellitus with hyperglycemia: Secondary | ICD-10-CM | POA: Diagnosis not present

## 2019-06-28 DIAGNOSIS — IMO0002 Reserved for concepts with insufficient information to code with codable children: Secondary | ICD-10-CM

## 2019-06-28 DIAGNOSIS — E119 Type 2 diabetes mellitus without complications: Secondary | ICD-10-CM | POA: Diagnosis not present

## 2019-06-28 DIAGNOSIS — R809 Proteinuria, unspecified: Secondary | ICD-10-CM

## 2019-06-28 DIAGNOSIS — G894 Chronic pain syndrome: Secondary | ICD-10-CM | POA: Diagnosis not present

## 2019-06-28 DIAGNOSIS — Z23 Encounter for immunization: Secondary | ICD-10-CM

## 2019-06-28 DIAGNOSIS — E785 Hyperlipidemia, unspecified: Secondary | ICD-10-CM

## 2019-06-28 LAB — POCT GLYCOSYLATED HEMOGLOBIN (HGB A1C): Hemoglobin A1C: 8.4 % — AB (ref 4.0–5.6)

## 2019-06-28 MED ORDER — PRAVASTATIN SODIUM 40 MG PO TABS: 40.0000 mg | ORAL_TABLET | Freq: Every day | ORAL | 1 refills | Status: DC

## 2019-06-28 MED ORDER — OLMESARTAN-AMLODIPINE-HCTZ 40-10-25 MG PO TABS: 1.0000 | ORAL_TABLET | Freq: Every day | ORAL | 1 refills | Status: DC

## 2019-06-28 MED ORDER — METOCLOPRAMIDE HCL 5 MG PO TABS: 5.0000 mg | ORAL_TABLET | Freq: Three times a day (TID) | ORAL | 0 refills | Status: DC

## 2019-06-28 MED ORDER — TRIAMCINOLONE ACETONIDE 0.1 % EX CREA: 1.0000 "application " | TOPICAL_CREAM | Freq: Two times a day (BID) | CUTANEOUS | 0 refills | Status: DC

## 2019-06-28 MED ORDER — VENLAFAXINE HCL ER 150 MG PO CP24: 150.0000 mg | ORAL_CAPSULE | Freq: Every day | ORAL | 1 refills | Status: DC

## 2019-06-28 MED ORDER — CARVEDILOL 3.125 MG PO TABS: 3.1250 mg | ORAL_TABLET | Freq: Two times a day (BID) | ORAL | 1 refills | Status: DC

## 2019-06-28 NOTE — Progress Notes (Signed)
Name: Lorraine Jones   MRN: [id number]    DOB: Feb 04, 1951   Date:06/28/2019       Progress Note  Subjective  Chief Complaint  Chief Complaint  Patient presents with  . Depression  . Diabetes  . Gastroesophageal Reflux  . Hypertension  . Hyperlipidemia    HPI  DMII with neuropathy,dyslipidemia and gastroparesis, microalbuminuria and mild diabetic retinopathy (diagnosed July 2020)  she is now on Metformin and insulin R and N types under the care of  Dr. Manfred Shirts. Long acting 32 units per Dr. Manfred Shirts but she is still on 28 units, also supposed to use pre-meal 15 units and go up to 17 units if fasting above 200 but she has not been checking glucose very often and takes it after meals instead of prior to meals. She states she has been compliant with her diet . She denies polydipsia or polyphagia, but she has intermittent episodes of polyuria, she has intermittent symptoms of neuropathy, described as aching like.She is taking statins and aspirin daily also on ARB, a last urine micro was normal at Dr. Lenon Ahmadi office, but she is due for repeat   She states indigestion is under control with Reglan before meals. [facility]- LDL slightly above goal but does not want to change medication .   HTNin DM:  she is compliant with medication and bp is at goal. No chest pain or palpitation.She has intermittent  lower extremity edema. BP towards the low of normal but denies dizziness. Since no dizziness we will continue current dose and monitor    Chronic Pain: she sees Dr. Primus Bravo at least once a month, taking pain medication as prescribed, she has constipation secondary to narcotics and is taking Miralaxotcto control symptoms.She still did not have hip replacement, she needs to wait until A1C is below 8 and she is scarred of having surgery    Hyperlipidemia: taking Pravastatin and denies side effects, discussed switching to Atorvastatin but she is  afraid of changing. She denies myalgias secondary to medication. No chest pain.We will recheck labs   Osteopenia: she had a bone density back in 03/2016 , her FRAX score is 3.2 % for major osteoporotic fracture in the next 10 years and 0.4 for hip .She is taking calcium plus D, last bone density was done 06/2019 and normal spine, mild osteopenia -1.2 and FRAX score was 3.3 % all fractures and 0.4 % for hip fracture no need for medication at this time  Obesity: BMI above 35 with co-,morbidities, discussed importance of weight loss. Low carb diet ( follow a diabetic diet) try water activities again since the gym is now open   Depressionmoderate and recurrent: she has a long of major depression, part of it secondary to living in pain and inability to do things she likes. She states Effexor and states coping better. States reading the bible and some other books and it seems to help her emotionally, she will try to add water aerobic again . No suicidal thoughts or ideation.   Patient Active Problem List   Diagnosis Date Noted  . 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 Morbid obesity ([place]) 12/01/2018  . Primary osteoarthritis of both hips 02/06/2018  . Hypertension associated with diabetes ([place]) 07/23/2017  . Hyperlipidemia due to type 2 diabetes mellitus ([place]) 07/23/2017  . Major depression, recurrent ([place]) 06/25/2016  . Osteopenia 04/18/2016  . Gastroesophageal  reflux disease without esophagitis 05/12/2015  . Chronic pain 05/12/2015  . Hyperlipidemia 05/12/2015  . Lumbar post-laminectomy syndrome 02/27/2015  . Neuropathy due to secondary diabetes ([place]) 02/27/2015  . Spinal stenosis, lumbar region, with neurogenic claudication 02/27/2015  . Sacroiliac joint dysfunction of both sides 02/27/2015  . Degenerative joint disease (DJD) of hip 02/27/2015  . DDD (degenerative disc disease), cervical  02/27/2015  . Bilateral occipital neuralgia 02/27/2015  . Chronic constipation 06/02/2014  . Hyponatremia 06/01/2014  . Hypertension, benign 06/01/2014    Past Surgical History:  Procedure Laterality Date  . CHOLECYSTECTOMY  1996  . CYSTOSCOPY WITH URETEROSCOPY Right 09/01/2013   Procedure: CYSTOSCOPY WITH UNROOFING  RIGHT URETEROCELE AND URETERAL STONE REMOVED  WITH GYRUS COLLINS KNIFE. ;  Surgeon: Irine Seal, MD;  Location: [facility];  Service: Urology;  Laterality: Right;  cysto, right uretersoscopy and stone extraction   collins knife  . LUMBAR DISC SURGERY  1993   L4  --  L5    Family History  Problem Relation Age of Onset  . Diabetes Mother   . Heart disease Mother   . Hypertension Mother   . Breast cancer Neg Hx     Social History   Socioeconomic History  . Marital status: Married    Spouse name: Not on file  . Number of children: 3  . Years of education: Not on file  . Highest education level: 12th grade  Occupational History  . Occupation: retired  Scientific laboratory technician  . Financial resource strain: Not very hard  . Food insecurity    Worry: Never true    Inability: Never true  . Transportation needs    Medical: No    Non-medical: No  Tobacco Use  . Smoking status: Current Every Day Smoker    Packs/day: 0.50    Years: 20.00    Pack years: 10.00    Types: Cigarettes  . Smokeless tobacco: Never Used  . Tobacco comment: she is cutting down   Substance and Sexual Activity  . Alcohol use: No    Alcohol/week: 0.0 standard drinks  . Drug use: No  . Sexual activity: Yes    Partners: Male  Lifestyle  . Physical activity    Days per week: 0 days    Minutes per session: 0 min  . Stress: Not at all  Relationships  . Social connections    Talks on phone: More than three times a week    Gets together: Three times a week    Attends religious service: More than 4 times per year    Active member of club or organization: No    Attends meetings of  clubs or organizations: Never    Relationship status: Married  . Intimate partner violence    Fear of current or ex partner: No    Emotionally abused: No    Physically abused: No    Forced sexual activity: No  Other Topics Concern  . Not on file  Social History Narrative  . Not on file     Current Outpatient Medications:  .  aspirin EC 81 MG tablet, Take 81 mg by mouth daily., Disp: , Rfl:  .  Blood Glucose Monitoring Suppl (ONETOUCH VERIO) w/Device KIT, USE TO TEST BLOOD SUGAR, Disp: , Rfl:  .  carvedilol (COREG) 3.125 MG tablet, Take 1 tablet (3.125 mg total) by mouth 2 (two) times daily., Disp: 180 tablet, Rfl: 1 .  Continuous Blood Gluc Receiver (FREESTYLE LIBRE 14 DAY READER) DEVI, Use  1 each as needed .  Use to test blood sugar, Disp: , Rfl:  .  Continuous Blood Gluc Sensor (FREESTYLE LIBRE 14 DAY SENSOR) MISC, Use 1 each every 14 (fourteen) days, Disp: , Rfl:  .  dorzolamide-timolol (COSOPT) 22.3-6.8 MG/ML ophthalmic solution, Place 1 drop into both eyes 2 (two) times daily., Disp: , Rfl:  .  fentaNYL (DURAGESIC - DOSED MCG/HR) 75 MCG/HR, Apply 2 patches to skin every 2 days if tolerated.   NOTE: Do not apply 100 g per hour fentanyl patch since insurance will not approve 100 g patch for you, Disp: 30 patch, Rfl: 0 .  glucose blood (ONE TOUCH ULTRA TEST) test strip, 1 strip by Other route 2 (two) times daily., Disp: , Rfl:  .  metFORMIN (GLUCOPHAGE) 1000 MG tablet, Take 1 tablet (1,000 mg total) by mouth 2 (two) times daily with a meal., Disp: 180 tablet, Rfl: 1 .  metoCLOPramide (REGLAN) 5 MG tablet, Take 1 tablet (5 mg total) by mouth 3 (three) times daily., Disp: 270 tablet, Rfl: 0 .  Multiple Vitamins-Minerals (MULTIVITAMIN WITH MINERALS) tablet, Take 1 tablet by mouth daily., Disp: , Rfl:  .  NOVOLIN N RELION 100 UNIT/ML injection, INJECT 28 UNITS SUBCUTANEOUSLY TWO TIMES DAILY., Disp: , Rfl:  .  NOVOLIN R RELION 100 UNIT/ML injection, INJECT 20 UNITS SUBCUTANEOUSLY THREE  TIMES DAILY BEFORE MEAL(S) (ADD SLIDING SCALE AS NECESSARY), Disp: , Rfl:  .  Olmesartan-amLODIPine-HCTZ (TRIBENZOR) 40-10-25 MG TABS, Take 1 tablet by mouth daily., Disp: 90 tablet, Rfl: 1 .  Oxycodone HCl 20 MG TABS, LIMIT 1/2 TO 1 TABLET BY MOUTH 3 TO 5 TIMES PER DAY IF TOLERATED *NOTE TABLET IS TWICE AS STRONG*, Disp: , Rfl:  .  polyethylene glycol (MIRALAX / GLYCOLAX) packet, Take 17 g by mouth daily., Disp: 14 each, Rfl: 0 .  pravastatin (PRAVACHOL) 40 MG tablet, Take 1 tablet (40 mg total) by mouth daily., Disp: 90 tablet, Rfl: 1 .  triamcinolone cream (KENALOG) 0.1 %, Apply 1 application topically 2 (two) times daily., Disp: 453.6 g, Rfl: 0 .  venlafaxine XR (EFFEXOR-XR) 150 MG 24 hr capsule, Take 1 capsule (150 mg total) by mouth daily with breakfast., Disp: 90 capsule, Rfl: 1  Allergies  Allergen Reactions  . Diclofenac Sodium Swelling  . Naproxen Sodium Swelling  . Etodolac Swelling  . Gabapentin Swelling  . Naproxen Swelling    I personally reviewed active problem list, medication list, allergies, family history, social history, health maintenance with the patient/caregiver today.   ROS  Constitutional: Negative for fever or weight change.  Respiratory: Negative for cough and shortness of breath.   Cardiovascular: Negative for chest pain or palpitations.  Gastrointestinal: Negative for abdominal pain, no bowel changes.  Musculoskeletal: Negative for gait problem or joint swelling.  Skin: Negative for rash.  Neurological: Negative for dizziness or headache.  No other specific complaints in a complete review of systems (except as listed in HPI above).  Objective  Vitals:   06/28/19 0922  BP: 120/60  Pulse: 90  Resp: 16  Temp: (!) 96.9 F (36.1 C)  TempSrc: Temporal  SpO2: 98%  Weight: 186 lb 3.2 oz (84.5 kg)  Height: _0  (1.549 m)    Body mass index is 35.18 kg/m.  Physical Exam  Constitutional: Patient appears well-developed and well-nourished. Obese No  distress.  HEENT: head atraumatic, normocephalic, pupils equal and reactive to light Cardiovascular: Normal rate, regular rhythm and normal heart sounds.  No murmur heard. No BLE edema. Pulmonary/Chest:  Effort normal and breath sounds normal. No respiratory distress. Abdominal: Soft.  There is no tenderness. Muscular skeletal: still using a cane, walks slowly, pain during palpation of lumbar spine, negative straight leg raise  Psychiatric: Patient has a normal mood and affect. behavior is normal. Judgment and thought content normal.  Recent Results (from the past 2160 hour(s))  HM DIABETES EYE EXAM     Status: Abnormal   Collection Time: 04/05/19 12:00 AM  Result Value Ref Range   HM Diabetic Eye Exam Retinopathy (A) No Retinopathy    Comment: Mild Nonproliferative Diabetic Retinopathy- OU-stable  POCT HgB A1C     Status: Abnormal   Collection Time: 06/28/19  9:34 AM  Result Value Ref Range   Hemoglobin A1C 8.4 (A) 4.0 - 5.6 %   HbA1c POC (<> result, manual entry)     HbA1c, POC (prediabetic range)     HbA1c, POC (controlled diabetic range)      Diabetic Foot Exam: Diabetic Foot Exam - Simple   Simple Foot Form Diabetic Foot exam was performed with the following findings: Yes 06/28/2019  9:45 AM  Visual Inspection No deformities, no ulcerations, no other skin breakdown bilaterally: Yes Sensation Testing Intact to touch and monofilament testing bilaterally: Yes Pulse Check Posterior Tibialis and Dorsalis pulse intact bilaterally: Yes Comments      PHQ2/9: Depression screen [facility] 2/9 06/28/2019 06/28/2019 05/11/2019 12/01/2018 03/10/2018  Decreased Interest 0 0 0 0 1  Down, Depressed, Hopeless - 0 0 0 1  PHQ - 2 Score 0 0 0 0 2  Altered sleeping 0 0 0 0 2  Tired, decreased energy 0 0 0 1 2  Change in appetite 0 0 0 1 3  Feeling bad or failure about yourself  0 0 0 0 1  Trouble concentrating 0 0 0 0 0  Moving slowly or fidgety/restless 0 0 0 0 0  Suicidal thoughts 0 0 0 0 0   PHQ-9 Score 0 0 0 2 10  Difficult doing work/chores Not difficult at all - Not difficult at all Not difficult at all Not difficult at all  Some recent data might be hidden    phq 9 is negative   Fall Risk: Fall Risk  06/28/2019 12/01/2018 12/01/2018 06/30/2017 05/16/2017  Falls in the past year? 0 0 0 No No  Comment - - - - -  Number falls in past yr: 0 0 0 - -  Injury with Fall? 0 0 0 - -  Risk for fall due to : - - - - -  Follow up - Falls prevention discussed - - -     Functional Status Survey: Is the patient deaf or have difficulty hearing?: No Does the patient have difficulty seeing, even when wearing glasses/contacts?: No Does the patient have difficulty concentrating, remembering, or making decisions?: No Does the patient have difficulty walking or climbing stairs?: No Does the patient have difficulty dressing or bathing?: No Does the patient have difficulty doing errands alone such as visiting a doctor's office or shopping?: No    Assessment & Plan  1. Hypertension, benign  - CBC with Differential/Platelet - carvedilol (COREG) 3.125 MG tablet; Take 1 tablet (3.125 mg total) by mouth 2 (two) times daily.  Dispense: 180 tablet; Refill: 1 - Olmesartan-amLODIPine-HCTZ (TRIBENZOR) 40-10-25 MG TABS; Take 1 tablet by mouth daily.  Dispense: 90 tablet; Refill: 1  2. Dyslipidemia  - Lipid panel - pravastatin (PRAVACHOL) 40 MG tablet; Take 1 tablet (40 mg total) by mouth  daily.  Dispense: 90 tablet; Refill: 1  3. Diabetes mellitus with coincident hypertension ([place])  - POCT HgB A1C - carvedilol (COREG) 3.125 MG tablet; Take 1 tablet (3.125 mg total) by mouth 2 (two) times daily.  Dispense: 180 tablet; Refill: 1 - Olmesartan-amLODIPine-HCTZ (TRIBENZOR) 40-10-25 MG TABS; Take 1 tablet by mouth daily.  Dispense: 90 tablet; Refill: 1  4. Chronic pain syndrome  Under the care of pain clinic   5. Uncontrolled type 2 diabetes with peripheral autonomic neuropathy (HCC)  -  COMPLETE METABOLIC PANEL WITH GFR - Microalbumin / creatinine urine ratio  6. Need for immunization against influenza  - Flu Vaccine QUAD High Dose(Fluad)  7. Need for vaccination for pneumococcus  - Pneumococcal polysaccharide vaccine 23-valent greater than or equal to 2yo subcutaneous/IM  8. Type 2 diabetes, uncontrolled, with gastroparesis (HCC)  - metoCLOPramide (REGLAN) 5 MG tablet; Take 1 tablet (5 mg total) by mouth 3 (three) times daily.  Dispense: 270 tablet; Refill: 0  9. Diabetes mellitus with microalbuminuria (HCC)  - Olmesartan-amLODIPine-HCTZ (TRIBENZOR) 40-10-25 MG TABS; Take 1 tablet by mouth daily.  Dispense: 90 tablet; Refill: 1  10. Major depression in remission (HCC)  - venlafaxine XR (EFFEXOR-XR) 150 MG 24 hr capsule; Take 1 capsule (150 mg total) by mouth daily with breakfast.  Dispense: 90 capsule; Refill: 1  11. Eczema of both upper extremities  - triamcinolone cream (KENALOG) 0.1 %; Apply 1 application topically 2 (two) times daily.  Dispense: 453.6 g; Refill: 0

## 2019-06-28 NOTE — Patient Instructions (Signed)
Cloudy insulin - N - long acting needs to be 32 units twice daily first thing in am and 12 hours later  Clear insulin - R - it to be used prior to meals not after meals.

## 2019-06-29 LAB — LIPID PANEL
Cholesterol: 210 mg/dL — ABNORMAL HIGH (ref <200)
HDL: 57 mg/dL (ref >=50)
LDL Cholesterol (Calc): 114 mg/dL (calc) — ABNORMAL HIGH
Non-HDL Cholesterol (Calc): 153 mg/dL (calc) — ABNORMAL HIGH (ref <130)
Total CHOL/HDL Ratio: 3.7 (calc) (ref <5.0)
Triglycerides: 270 mg/dL — ABNORMAL HIGH (ref <150)

## 2019-06-29 LAB — COMPLETE METABOLIC PANEL WITH GFR
AG Ratio: 1.8 (calc) (ref 1.0–2.5)
ALT: 33 U/L — ABNORMAL HIGH (ref 6–29)
AST: 27 U/L (ref 10–35)
Albumin: 4.4 g/dL (ref 3.6–5.1)
Alkaline phosphatase (APISO): 138 U/L (ref 37–153)
BUN/Creatinine Ratio: 11 (calc) (ref 6–22)
BUN: 14 mg/dL (ref 7–25)
CO2: 29 mmol/L (ref 20–32)
Calcium: 9.5 mg/dL (ref 8.6–10.4)
Chloride: 102 mmol/L (ref 98–110)
Creat: 1.27 mg/dL — ABNORMAL HIGH (ref 0.50–0.99)
GFR, Est African American: 50 mL/min/{1.73_m2} — ABNORMAL LOW (ref >=60)
GFR, Est Non African American: 43 mL/min/{1.73_m2} — ABNORMAL LOW (ref >=60)
Globulin: 2.5 g/dL (calc) (ref 1.9–3.7)
Glucose, Bld: 212 mg/dL — ABNORMAL HIGH (ref 65–99)
Potassium: 4.1 mmol/L (ref 3.5–5.3)
Sodium: 140 mmol/L (ref 135–146)
Total Bilirubin: 0.3 mg/dL (ref 0.2–1.2)
Total Protein: 6.9 g/dL (ref 6.1–8.1)

## 2019-06-29 LAB — CBC WITH DIFFERENTIAL/PLATELET
Absolute Monocytes: 392 cells/uL (ref 200–950)
Basophils Absolute: 67 cells/uL (ref 0–200)
Basophils Relative: 0.9 %
Eosinophils Absolute: 392 cells/uL (ref 15–500)
Eosinophils Relative: 5.3 %
HCT: 38.5 % (ref 35.0–45.0)
Hemoglobin: 12.7 g/dL (ref 11.7–15.5)
Lymphs Abs: 2005 cells/uL (ref 850–3900)
MCH: 29.5 pg (ref 27.0–33.0)
MCHC: 33 g/dL (ref 32.0–36.0)
MCV: 89.3 fL (ref 80.0–100.0)
MPV: 9.7 fL (ref 7.5–12.5)
Monocytes Relative: 5.3 %
Neutro Abs: 4544 cells/uL (ref 1500–7800)
Neutrophils Relative %: 61.4 %
Platelets: 325 10*3/uL (ref 140–400)
RBC: 4.31 10*6/uL (ref 3.80–5.10)
RDW: 12.5 % (ref 11.0–15.0)
Total Lymphocyte: 27.1 %
WBC: 7.4 10*3/uL (ref 3.8–10.8)

## 2019-06-29 LAB — MICROALBUMIN / CREATININE URINE RATIO
Creatinine, Urine: 185 mg/dL (ref 20–275)
Microalb Creat Ratio: 30 mcg/mg creat — ABNORMAL HIGH (ref <30)
Microalb, Ur: 5.5 mg/dL

## 2019-06-30 ENCOUNTER — Other Ambulatory Visit: Payer: Self-pay | Admitting: Family Medicine

## 2019-06-30 DIAGNOSIS — E785 Hyperlipidemia, unspecified: Secondary | ICD-10-CM

## 2019-06-30 MED ORDER — ROSUVASTATIN CALCIUM 20 MG PO TABS: 20.0000 mg | ORAL_TABLET | Freq: Every day | ORAL | 0 refills | Status: DC

## 2019-07-02 ENCOUNTER — Telehealth: Payer: Self-pay

## 2019-07-02 NOTE — Telephone Encounter (Signed)
Copied from [place] [id number]. Topic: General - Call Back - No Documentation >> Jul 01, 2019  2:01 PM Yvette Rack wrote: Reason for CRM: Pt stated she had a missed call from the office and she needs to speak with Dr. Ancil Boozer' nurse. Pt requests call back.

## 2019-07-20 DIAGNOSIS — E1159 Type 2 diabetes mellitus with other circulatory complications: Secondary | ICD-10-CM | POA: Diagnosis not present

## 2019-07-20 DIAGNOSIS — Z794 Long term (current) use of insulin: Secondary | ICD-10-CM | POA: Diagnosis not present

## 2019-07-20 DIAGNOSIS — E1169 Type 2 diabetes mellitus with other specified complication: Secondary | ICD-10-CM | POA: Diagnosis not present

## 2019-07-20 DIAGNOSIS — I10 Essential (primary) hypertension: Secondary | ICD-10-CM | POA: Diagnosis not present

## 2019-07-20 DIAGNOSIS — E1165 Type 2 diabetes mellitus with hyperglycemia: Secondary | ICD-10-CM | POA: Diagnosis not present

## 2019-07-21 DIAGNOSIS — Z5181 Encounter for therapeutic drug level monitoring: Secondary | ICD-10-CM | POA: Diagnosis not present

## 2019-07-21 DIAGNOSIS — M5416 Radiculopathy, lumbar region: Secondary | ICD-10-CM | POA: Diagnosis not present

## 2019-07-21 DIAGNOSIS — G894 Chronic pain syndrome: Secondary | ICD-10-CM | POA: Diagnosis not present

## 2019-07-21 DIAGNOSIS — M5136 Other intervertebral disc degeneration, lumbar region: Secondary | ICD-10-CM | POA: Diagnosis not present

## 2019-08-03 ENCOUNTER — Telehealth: Payer: Self-pay | Admitting: Family Medicine

## 2019-08-03 NOTE — Chronic Care Management (AMB) (Signed)
Chronic Care Management   Note  08/03/2019 Name: Lorraine Jones MRN: [id number] DOB: 04-17-1951  Lorraine Jones is a 69 y.o. year old female who is a primary care patient of Steele Sizer, MD. I reached out to [place] by phone today in response to a referral sent by Lorraine Jones's health plan.     Lorraine Jones was given information about Chronic Care Management services today including:  1. CCM service includes personalized support from designated clinical staff supervised by her physician, including individualized plan of care and coordination with other care providers 2. 24/7 contact phone numbers for assistance for urgent and routine care needs. 3. Service will only be billed when office clinical staff spend 20 minutes or more in a month to coordinate care. 4. Only one practitioner may furnish and bill the service in a calendar month. 5. The patient may stop CCM services at any time (effective at the end of the month) by phone call to the office staff. 6. The patient will be responsible for cost sharing (co-pay) of up to 20% of the service fee (after annual deductible is met).  Patient agreed to services and verbal consent obtained.   Follow up plan: Telephone appointment with CCM team member scheduled for: 08/18/2019  [place]  ??[email or web address]   ??[phone]

## 2019-08-18 ENCOUNTER — Telehealth: Payer: Medicare Other

## 2019-08-18 ENCOUNTER — Ambulatory Visit: Payer: Self-pay

## 2019-08-18 NOTE — Chronic Care Management (AMB) (Signed)
  Chronic Care Management   Outreach Note  08/18/2019 Name: Lorraine Jones MRN: [id number] DOB: May 20, 1951    Referred by: Health Plan Primary Care Provider: Steele Sizer, MD   An unsuccessful telephone outreach was attempted today. [name] was referred to the case management team for assistance with care management and care coordination. Left a HIPAA compliant voice message along with contact information requesting a return call.   Follow Up Plan: The care management team will reach out to [name] again within the next two to three weeks.   [facility] Care [facility] [phone]   SIGNATURE

## 2019-08-23 DIAGNOSIS — M5136 Other intervertebral disc degeneration, lumbar region: Secondary | ICD-10-CM | POA: Diagnosis not present

## 2019-08-23 DIAGNOSIS — M5416 Radiculopathy, lumbar region: Secondary | ICD-10-CM | POA: Diagnosis not present

## 2019-08-23 DIAGNOSIS — G894 Chronic pain syndrome: Secondary | ICD-10-CM | POA: Diagnosis not present

## 2019-09-06 ENCOUNTER — Telehealth: Payer: Self-pay

## 2019-09-06 ENCOUNTER — Ambulatory Visit: Payer: Self-pay

## 2019-09-06 NOTE — Chronic Care Management (AMB) (Signed)
  Chronic Care Management   Outreach Note  09/06/2019 Name: Lorraine Jones MRN: [id number] DOB: 1950-11-23  Primary Care Provider: Steele Sizer, MD Reason for referral : Chronic Care Management   A second unsuccessful telephone outreach was attempted today. Ms. Poppell was referred to the case management team for assistance with care management and care coordination. Left a HIPAA compliant voice message along with contact information requesting a return call.  Follow Up Plan: The care management team will reach out to Ms. Coberly again within the next two to three weeks.   [facility] Care [facility] [phone]

## 2019-09-27 ENCOUNTER — Ambulatory Visit: Payer: Self-pay

## 2019-09-27 ENCOUNTER — Other Ambulatory Visit: Payer: Self-pay | Admitting: Family Medicine

## 2019-09-27 ENCOUNTER — Telehealth: Payer: Self-pay

## 2019-09-27 DIAGNOSIS — E785 Hyperlipidemia, unspecified: Secondary | ICD-10-CM

## 2019-09-27 DIAGNOSIS — G894 Chronic pain syndrome: Secondary | ICD-10-CM | POA: Diagnosis not present

## 2019-09-27 DIAGNOSIS — M5416 Radiculopathy, lumbar region: Secondary | ICD-10-CM | POA: Diagnosis not present

## 2019-09-27 DIAGNOSIS — M5136 Other intervertebral disc degeneration, lumbar region: Secondary | ICD-10-CM | POA: Diagnosis not present

## 2019-09-27 NOTE — Chronic Care Management (AMB) (Signed)
  Chronic Care Management   Outreach Note  09/27/2019 Name: Lorraine Jones MRN: [id number] DOB: Oct 17, 1950     Primary Care Provider: Steele Sizer, MD Reason for referral : Chronic Care Management    Third unsuccessful telephone outreach was attempted today. [name] was referred to the care management team for assistance with chronic care management and care coordination. Her primary care provider will be notified of our unsuccessful attempts to establish and maintain contact. The care management team will gladly outreach at any time in the future if she is interested in receiving assistance.   Follow Up Plan:  The care management team will gladly follow up with [name] after the primary care provider has a conversation with her regarding recommendation for care management engagement and subsequent re-referral for care management services.    [facility] Care Management [phone]

## 2019-10-16 ENCOUNTER — Other Ambulatory Visit: Payer: Self-pay | Admitting: Family Medicine

## 2019-10-16 DIAGNOSIS — IMO0002 Reserved for concepts with insufficient information to code with codable children: Secondary | ICD-10-CM

## 2019-10-16 DIAGNOSIS — E1143 Type 2 diabetes mellitus with diabetic autonomic (poly)neuropathy: Secondary | ICD-10-CM

## 2019-10-17 NOTE — Telephone Encounter (Signed)
Requested medication (s) are due for refill today: yes  Requested medication (s) are on the active medication list: yes  Last refill:  06/28/19 #270  Future visit scheduled: yes  Notes to clinic:  Refill not delegated per protocol.    Requested Prescriptions  Pending Prescriptions Disp Refills   metoCLOPramide (REGLAN) 5 MG tablet [Pharmacy Med Name: METOCLOPRAMIDE 5 MG TABLET] 270 tablet 0    Sig: TAKE 1 TABLET BY MOUTH THREE TIMES A DAY      Not Delegated - Gastroenterology: Antiemetics Failed - 10/16/2019 11:39 AM      Failed - This refill cannot be delegated      Passed - Valid encounter within last 6 months    Recent Outpatient Visits           3 months ago Hypertension, benign   [facility], Drue Stager, MD   5 months ago Allergic contact dermatitis due to other agents   [place], FNP   10 months ago Moderate episode of recurrent major depressive disorder [facility])   [facility] Steele Sizer, MD   1 year ago Type 2 diabetes, uncontrolled, with gastroparesis [facility])   [facility] Steele Sizer, MD   1 year ago Acute intractable headache, unspecified headache type   [place], NP       Future Appointments             In 1 month  [facility], [place]   In 2 months Steele Sizer, MD [facility], [facility]

## 2019-10-25 DIAGNOSIS — M259 Joint disorder, unspecified: Secondary | ICD-10-CM | POA: Diagnosis not present

## 2019-10-25 DIAGNOSIS — M545 Low back pain: Secondary | ICD-10-CM | POA: Diagnosis not present

## 2019-10-25 DIAGNOSIS — M5136 Other intervertebral disc degeneration, lumbar region: Secondary | ICD-10-CM | POA: Diagnosis not present

## 2019-10-25 DIAGNOSIS — M169 Osteoarthritis of hip, unspecified: Secondary | ICD-10-CM | POA: Diagnosis not present

## 2019-10-25 DIAGNOSIS — G894 Chronic pain syndrome: Secondary | ICD-10-CM | POA: Diagnosis not present

## 2019-10-25 DIAGNOSIS — I119 Hypertensive heart disease without heart failure: Secondary | ICD-10-CM | POA: Diagnosis not present

## 2019-10-25 DIAGNOSIS — M5416 Radiculopathy, lumbar region: Secondary | ICD-10-CM | POA: Diagnosis not present

## 2019-10-25 DIAGNOSIS — E1142 Type 2 diabetes mellitus with diabetic polyneuropathy: Secondary | ICD-10-CM | POA: Diagnosis not present

## 2019-10-25 DIAGNOSIS — R5382 Chronic fatigue, unspecified: Secondary | ICD-10-CM | POA: Diagnosis not present

## 2019-10-25 DIAGNOSIS — M48062 Spinal stenosis, lumbar region with neurogenic claudication: Secondary | ICD-10-CM | POA: Diagnosis not present

## 2019-11-22 DIAGNOSIS — Z5181 Encounter for therapeutic drug level monitoring: Secondary | ICD-10-CM | POA: Diagnosis not present

## 2019-11-22 DIAGNOSIS — M545 Low back pain: Secondary | ICD-10-CM | POA: Diagnosis not present

## 2019-11-22 DIAGNOSIS — M259 Joint disorder, unspecified: Secondary | ICD-10-CM | POA: Diagnosis not present

## 2019-11-22 DIAGNOSIS — M5136 Other intervertebral disc degeneration, lumbar region: Secondary | ICD-10-CM | POA: Diagnosis not present

## 2019-11-22 DIAGNOSIS — M5416 Radiculopathy, lumbar region: Secondary | ICD-10-CM | POA: Diagnosis not present

## 2019-11-24 DIAGNOSIS — Z794 Long term (current) use of insulin: Secondary | ICD-10-CM | POA: Diagnosis not present

## 2019-11-24 DIAGNOSIS — E1169 Type 2 diabetes mellitus with other specified complication: Secondary | ICD-10-CM | POA: Diagnosis not present

## 2019-11-24 DIAGNOSIS — E1159 Type 2 diabetes mellitus with other circulatory complications: Secondary | ICD-10-CM | POA: Diagnosis not present

## 2019-11-24 DIAGNOSIS — E785 Hyperlipidemia, unspecified: Secondary | ICD-10-CM | POA: Diagnosis not present

## 2019-11-24 DIAGNOSIS — E1165 Type 2 diabetes mellitus with hyperglycemia: Secondary | ICD-10-CM | POA: Diagnosis not present

## 2019-11-27 ENCOUNTER — Other Ambulatory Visit: Payer: Self-pay | Admitting: Family Medicine

## 2019-11-27 DIAGNOSIS — IMO0002 Reserved for concepts with insufficient information to code with codable children: Secondary | ICD-10-CM

## 2019-11-27 DIAGNOSIS — E1143 Type 2 diabetes mellitus with diabetic autonomic (poly)neuropathy: Secondary | ICD-10-CM

## 2019-11-27 NOTE — Telephone Encounter (Signed)
Requested medication (s) are due for refill today: yes  Requested medication (s) are on the active medication list: yes  Last refill:  10/18/2019  Future visit scheduled: yes  Notes to clinic:  this refill cannot be delegated    Requested Prescriptions  Pending Prescriptions Disp Refills   metoCLOPramide (REGLAN) 5 MG tablet [Pharmacy Med Name: METOCLOPRAMIDE 5 MG TABLET] 90 tablet 0    Sig: TAKE 1 TABLET BY MOUTH THREE TIMES A DAY      Not Delegated - Gastroenterology: Antiemetics Failed - 11/27/2019 10:24 AM      Failed - This refill cannot be delegated      Passed - Valid encounter within last 6 months    Recent Outpatient Visits           5 months ago Hypertension, benign   [facility], Drue Stager, MD   6 months ago Allergic contact dermatitis due to other agents   [place], FNP   12 months ago Moderate episode of recurrent major depressive disorder [facility])   [facility] Steele Sizer, MD   1 year ago Type 2 diabetes, uncontrolled, with gastroparesis [facility])   [facility] Steele Sizer, MD   1 year ago Acute intractable headache, unspecified headache type   [place], NP       Future Appointments             In 1 week  [facility], [place]   In 1 month Steele Sizer, MD [facility], [facility]

## 2019-12-07 ENCOUNTER — Ambulatory Visit: Payer: Medicare Other

## 2019-12-20 DIAGNOSIS — M545 Low back pain: Secondary | ICD-10-CM | POA: Diagnosis not present

## 2019-12-20 DIAGNOSIS — M5136 Other intervertebral disc degeneration, lumbar region: Secondary | ICD-10-CM | POA: Diagnosis not present

## 2019-12-20 DIAGNOSIS — M5416 Radiculopathy, lumbar region: Secondary | ICD-10-CM | POA: Diagnosis not present

## 2019-12-20 DIAGNOSIS — G894 Chronic pain syndrome: Secondary | ICD-10-CM | POA: Diagnosis not present

## 2019-12-22 ENCOUNTER — Other Ambulatory Visit: Payer: Self-pay | Admitting: Family Medicine

## 2019-12-22 DIAGNOSIS — I10 Essential (primary) hypertension: Secondary | ICD-10-CM

## 2019-12-22 DIAGNOSIS — E119 Type 2 diabetes mellitus without complications: Secondary | ICD-10-CM

## 2019-12-25 DIAGNOSIS — Z794 Long term (current) use of insulin: Secondary | ICD-10-CM | POA: Diagnosis not present

## 2019-12-25 DIAGNOSIS — E1165 Type 2 diabetes mellitus with hyperglycemia: Secondary | ICD-10-CM | POA: Diagnosis not present

## 2019-12-27 ENCOUNTER — Ambulatory Visit (INDEPENDENT_AMBULATORY_CARE_PROVIDER_SITE_OTHER): Payer: Medicare Other | Admitting: Family Medicine

## 2019-12-27 ENCOUNTER — Encounter: Payer: Self-pay | Admitting: Family Medicine

## 2019-12-27 ENCOUNTER — Other Ambulatory Visit: Payer: Self-pay

## 2019-12-27 VITALS — BP 120/50 | HR 106 | Temp 97.1°F | Resp 16 | Ht 61.0 in | Wt 188.0 lb

## 2019-12-27 DIAGNOSIS — E1165 Type 2 diabetes mellitus with hyperglycemia: Secondary | ICD-10-CM

## 2019-12-27 DIAGNOSIS — F330 Major depressive disorder, recurrent, mild: Secondary | ICD-10-CM

## 2019-12-27 DIAGNOSIS — G894 Chronic pain syndrome: Secondary | ICD-10-CM | POA: Diagnosis not present

## 2019-12-27 DIAGNOSIS — E785 Hyperlipidemia, unspecified: Secondary | ICD-10-CM

## 2019-12-27 DIAGNOSIS — IMO0002 Reserved for concepts with insufficient information to code with codable children: Secondary | ICD-10-CM

## 2019-12-27 DIAGNOSIS — E1169 Type 2 diabetes mellitus with other specified complication: Secondary | ICD-10-CM

## 2019-12-27 DIAGNOSIS — E119 Type 2 diabetes mellitus without complications: Secondary | ICD-10-CM

## 2019-12-27 DIAGNOSIS — I10 Essential (primary) hypertension: Secondary | ICD-10-CM | POA: Diagnosis not present

## 2019-12-27 DIAGNOSIS — E1143 Type 2 diabetes mellitus with diabetic autonomic (poly)neuropathy: Secondary | ICD-10-CM

## 2019-12-27 DIAGNOSIS — E1129 Type 2 diabetes mellitus with other diabetic kidney complication: Secondary | ICD-10-CM

## 2019-12-27 DIAGNOSIS — E1159 Type 2 diabetes mellitus with other circulatory complications: Secondary | ICD-10-CM

## 2019-12-27 DIAGNOSIS — F325 Major depressive disorder, single episode, in full remission: Secondary | ICD-10-CM

## 2019-12-27 DIAGNOSIS — R809 Proteinuria, unspecified: Secondary | ICD-10-CM

## 2019-12-27 LAB — POCT GLYCOSYLATED HEMOGLOBIN (HGB A1C): Hemoglobin A1C: 8.3 % — AB (ref 4.0–5.6)

## 2019-12-27 MED ORDER — CARVEDILOL 6.25 MG PO TABS: 3.1250 mg | ORAL_TABLET | Freq: Two times a day (BID) | ORAL | 1 refills | Status: DC

## 2019-12-27 MED ORDER — VENLAFAXINE HCL ER 150 MG PO CP24: 150.0000 mg | ORAL_CAPSULE | Freq: Every day | ORAL | 1 refills | Status: DC

## 2019-12-27 MED ORDER — OLMESARTAN-AMLODIPINE-HCTZ 40-5-25 MG PO TABS: 1.0000 | ORAL_TABLET | Freq: Every day | ORAL | 1 refills | Status: DC

## 2019-12-27 NOTE — Progress Notes (Signed)
Name: Lorraine Jones   MRN: [id number]    DOB: [birth year]   Date:12/27/2019       Progress Note  Subjective  Chief Complaint  Chief Complaint  Patient presents with  . Depression  . Diabetes  . Hypertension  . Gastroesophageal Reflux  . Hyperlipidemia    HPI  DMII with neuropathy,dyslipidemia and gastroparesis, microalbuminuria and mild diabetic retinopathy (diagnosed July 2020)  she is now on Metformin and insulin R and N typesunder the care ofDr. O'Connel. Long acting 32 units , she is also on  pre-meal 15 units and go up to 17 units if fasting above 200 but she has not been checking glucose very often and takes it after meals instead of prior to meals, therefore she usually only gives herself 15 units, she is trying to get a Free Style meter.  She states she craves sweets a few times a week and eats potato chips or cake when she is craving She denies polydipsia or polyphagia, but she has intermittent episodes of polyuria, she has intermittent symptoms of neuropathy, described as aching sensation on her feet. She is taking statins and aspirin daily also on ARB, alast urine micro was 30 She states indigestion is under control with Reglan before meals. She is back on Pravastatin because she did not like Rosuvastatin   HTNin DM:  she is compliant with medication and bp is at goal around 120's/60's-70's No chest pain or palpitation.She has intermittent  lower extremity edema.BP towards the low of normal and she does not like the lower extremity edema, we will go down on dose of Tribenzor from 40/10/25 to 40/5/25 and increase carvedilol to 6.25 mg BID , she will return for bp check one week after dose change   Chronic Pain: she sees Dr. Primus Bravo at least once a month, taking pain medication as prescribed, she has constipation secondary to narcotics and is taking Miralaxotcto control symptoms.Shestill did not have hip replacement, she needs to wait until A1C is below 8 and she is  also scarred of having surgery because of COVID-19. She got her vaccines already for COVID. She states still using a cane when out of the house to prevent falls since when the pain is intense it radiation down both thighs and she feels weak. Pain is like a nagging toothache  Hyperlipidemia: she is still on Pravastatin, she stopped Crestor and went back on Pravastatin because of side effects.  She denies myalgias secondary to medication. No chest pain.  Osteopenia: she had a bone density back in 03/2016 , her FRAX score is 3.2 % for major osteoporotic fracture in the next 10 years and 0.4 for hip .She is taking calcium plus D, last bone density was done 06/2019 and normal spine, mild osteopenia -1.2 and FRAX score was 3.3 % all fractures and 0.4 % for hip fracture no need for medication at this time. Discussed high calcium diet   Obesity: BMI above 35 with co-,morbidities, discussed importance of weight loss. Low carb diet ( follow a diabetic diet) , she is still not back to the gym, worried about COVID-19  Depressionmoderate and recurrent: she has a long of major depression, part of it secondary to living in pain and inability to do things she likes. She states Effexorand states coping better. States reading the bible and some other books and it seems to help her emotionally, she states currently waiting to go back to water exercises whenever gym resumes water classes. She thinks exercise will help  her   Patient Active Problem List   Diagnosis Date Noted  . 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 2 diabetes mellitus ([place]) 04/06/2019  . Morbid obesity ([place]) 12/01/2018  . Primary osteoarthritis of both hips 02/06/2018  . Hypertension associated with diabetes ([place]) 07/23/2017  . Hyperlipidemia due to type 2 diabetes mellitus ([place]) 07/23/2017  . Major depression, recurrent ([place]) 06/25/2016  . Osteopenia 04/18/2016  . Gastroesophageal  reflux disease without esophagitis 05/12/2015  . Chronic pain 05/12/2015  . Hyperlipidemia 05/12/2015  . Lumbar post-laminectomy syndrome 02/27/2015  . Neuropathy due to secondary diabetes ([place]) 02/27/2015  . Spinal stenosis, lumbar region, with neurogenic claudication 02/27/2015  . Sacroiliac joint dysfunction of both sides 02/27/2015  . Degenerative joint disease (DJD) of hip 02/27/2015  . DDD (degenerative disc disease), cervical 02/27/2015  . Bilateral occipital neuralgia 02/27/2015  . Chronic constipation 06/02/2014  . Hyponatremia 06/01/2014  . Hypertension, benign 06/01/2014    Past Surgical History:  Procedure Laterality Date  . CHOLECYSTECTOMY  1996  . CYSTOSCOPY WITH URETEROSCOPY Right 09/01/2013   Procedure: CYSTOSCOPY WITH UNROOFING  RIGHT URETEROCELE AND URETERAL STONE REMOVED  WITH GYRUS COLLINS KNIFE. ;  Surgeon: Irine Seal, MD;  Location: [place];  Service: Urology;  Laterality: Right;  cysto, right uretersoscopy and stone extraction   collins knife  . LUMBAR DISC SURGERY  1993   L4  --  L5    Family History  Problem Relation Age of Onset  . Diabetes Mother   . Heart disease Mother   . Hypertension Mother   . Breast cancer Neg Hx     Social History   Tobacco Use  . Smoking status: Current Every Day Smoker    Packs/day: 0.50    Years: 20.00    Pack years: 10.00    Types: Cigarettes  . Smokeless tobacco: Never Used  . Tobacco comment: she is cutting down   Substance Use Topics  . Alcohol use: No    Alcohol/week: 0.0 standard drinks     Current Outpatient Medications:  .  aspirin EC 81 MG tablet, Take 81 mg by mouth daily., Disp: , Rfl:  .  Blood Glucose Monitoring Suppl (ONETOUCH VERIO) w/Device KIT, USE TO TEST BLOOD SUGAR, Disp: , Rfl:  .  Continuous Blood Gluc Receiver (FREESTYLE LIBRE 14 DAY READER) DEVI, Use 1 each as needed .  Use to test blood sugar, Disp: , Rfl:  .  Continuous Blood Gluc Sensor (FREESTYLE LIBRE 14 DAY  SENSOR) MISC, Use 1 each every 14 (fourteen) days, Disp: , Rfl:  .  dorzolamide-timolol (COSOPT) 22.3-6.8 MG/ML ophthalmic solution, Place 1 drop into both eyes 2 (two) times daily., Disp: , Rfl:  .  fentaNYL (DURAGESIC - DOSED MCG/HR) 75 MCG/HR, Apply 2 patches to skin every 2 days if tolerated.   NOTE: Do not apply 100 g per hour fentanyl patch since insurance will not approve 100 g patch for you, Disp: 30 patch, Rfl: 0 .  glucose blood (ONE TOUCH ULTRA TEST) test strip, 1 strip by Other route 2 (two) times daily., Disp: , Rfl:  .  metFORMIN (GLUCOPHAGE) 1000 MG tablet, Take 1 tablet (1,000 mg total) by mouth 2 (two) times daily with a meal., Disp: 180 tablet, Rfl: 1 .  metoCLOPramide (REGLAN) 5 MG tablet, TAKE 1 TABLET BY MOUTH THREE TIMES A DAY, Disp: 90 tablet, Rfl: 0 .  Multiple Vitamins-Minerals (MULTIVITAMIN WITH MINERALS) tablet, Take 1 tablet by mouth daily.,  Disp: , Rfl:  .  NOVOLIN N RELION 100 UNIT/ML injection, INJECT 28 UNITS SUBCUTANEOUSLY TWO TIMES DAILY., Disp: , Rfl:  .  NOVOLIN R RELION 100 UNIT/ML injection, INJECT 20 UNITS SUBCUTANEOUSLY THREE TIMES DAILY BEFORE MEAL(S) (ADD SLIDING SCALE AS NECESSARY), Disp: , Rfl:  .  Oxycodone HCl 20 MG TABS, LIMIT 1/2 TO 1 TABLET BY MOUTH 3 TO 5 TIMES PER DAY IF TOLERATED *NOTE TABLET IS TWICE AS STRONG*, Disp: , Rfl:  .  polyethylene glycol (MIRALAX / GLYCOLAX) packet, Take 17 g by mouth daily., Disp: 14 each, Rfl: 0 .  triamcinolone cream (KENALOG) 0.1 %, Apply 1 application topically 2 (two) times daily., Disp: 453.6 g, Rfl: 0 .  carvedilol (COREG) 6.25 MG tablet, Take 0.5 tablets (3.125 mg total) by mouth 2 (two) times daily., Disp: 180 tablet, Rfl: 1 .  Olmesartan-amLODIPine-HCTZ 40-5-25 MG TABS, Take 1 tablet by mouth daily., Disp: 90 tablet, Rfl: 1 .  venlafaxine XR (EFFEXOR-XR) 150 MG 24 hr capsule, Take 1 capsule (150 mg total) by mouth daily with breakfast., Disp: 90 capsule, Rfl: 1  Allergies  Allergen Reactions  . Diclofenac  Sodium Swelling  . Naproxen Sodium Swelling  . Etodolac Swelling  . Gabapentin Swelling  . Naproxen Swelling    I personally reviewed active problem list, medication list, allergies, family history, social history, health maintenance with the patient/caregiver today.   ROS  Constitutional: Negative for fever or weight change.  Respiratory: Negative for cough and shortness of breath.   Cardiovascular: Negative for chest pain or palpitations.  Gastrointestinal: Negative for abdominal pain, no bowel changes.  Musculoskeletal: Positive for gait problem but no  joint swelling.  Skin: Negative for rash.  Neurological: Negative for dizziness or headache.  No other specific complaints in a complete review of systems (except as listed in HPI above).  Objective  Vitals:   12/27/19 0856 12/27/19 0917  BP: (!) 140/50 (!) 120/50  Pulse: (!) 106   Resp: 16   Temp: (!) 97.1 F (36.2 C)   TempSrc: Temporal   SpO2: 96%   Weight: 188 lb (85.3 kg)   Height: 5' 1"  (1.549 m)     Body mass index is 35.52 kg/m.  Physical Exam  Constitutional: Patient appears well-developed and well-nourished. Obese No distress.  HEENT: head atraumatic, normocephalic, pupils equal and reactive to light Cardiovascular: Normal rate, regular rhythm and normal heart sounds.  No murmur heard. No BLE edema. Pulmonary/Chest: Effort normal and breath sounds normal. No respiratory distress. Abdominal: Soft.  There is no tenderness. Muscular skeletal: mid low back pain , negative straight leg raise  Psychiatric: Patient has a normal mood and affect. behavior is normal. Judgment and thought content normal.  Recent Results (from the past 2160 hour(s))  POCT HgB A1C     Status: Abnormal   Collection Time: 12/27/19  9:22 AM  Result Value Ref Range   Hemoglobin A1C 8.3 (A) 4.0 - 5.6 %   HbA1c POC (<> result, manual entry)     HbA1c, POC (prediabetic range)     HbA1c, POC (controlled diabetic range)         PHQ2/9: Depression screen [facility] 2/9 12/27/2019 06/28/2019 06/28/2019 05/11/2019 12/01/2018  Decreased Interest 0 0 0 0 0  Down, Depressed, Hopeless 1 - 0 0 0  PHQ - 2 Score 1 0 0 0 0  Altered sleeping 0 0 0 0 0  Tired, decreased energy 1 0 0 0 1  Change in appetite 0 0 0 0  1  Feeling bad or failure about yourself  0 0 0 0 0  Trouble concentrating 0 0 0 0 0  Moving slowly or fidgety/restless 1 0 0 0 0  Suicidal thoughts 0 0 0 0 0  PHQ-9 Score 3 0 0 0 2  Difficult doing work/chores Not difficult at all Not difficult at all - Not difficult at all Not difficult at all  Some recent data might be hidden    phq 9 is positive   Fall Risk: Fall Risk  12/27/2019 06/28/2019 12/01/2018 12/01/2018 06/30/2017  Falls in the past year? 0 0 0 0 No  Comment - - - - -  Number falls in past yr: 0 0 0 0 -  Injury with Fall? 0 0 0 0 -  Risk for fall due to : - - - - -  Follow up - - Falls prevention discussed - -    Functional Status Survey: Is the patient deaf or have difficulty hearing?: No Does the patient have difficulty seeing, even when wearing glasses/contacts?: No Does the patient have difficulty concentrating, remembering, or making decisions?: No Does the patient have difficulty walking or climbing stairs?: No Does the patient have difficulty dressing or bathing?: No Does the patient have difficulty doing errands alone such as visiting a doctor's office or shopping?: No   Assessment & Plan  1. Uncontrolled type 2 diabetes with peripheral autonomic neuropathy ([place])  Reminded her again not to let CMA do her A1C in our office - POCT HgB A1C  2. Chronic pain syndrome  Keep follow up with Dr. Primus Bravo   3. Hypertension, benign  - carvedilol (COREG) 6.25 MG tablet; Take 0.5 tablets (3.125 mg total) by mouth 2 (two) times daily.  Dispense: 180 tablet; Refill: 1 - Olmesartan-amLODIPine-HCTZ 40-5-25 MG TABS; Take 1 tablet by mouth daily.  Dispense: 90 tablet; Refill: 1  4. Type 2 diabetes,  uncontrolled, with gastroparesis ([place])   5. Morbid obesity ([place])  Discussed with the patient the risk posed by an increased BMI. Discussed importance of portion control, calorie counting and at least 150 minutes of physical activity weekly. Avoid sweet beverages and drink more water. Eat at least 6 servings of fruit and vegetables daily   6. Dyslipidemia associated with type 2 diabetes mellitus ([place])   7. Mild recurrent major depression (HCC)  On Effexor   8. Hypertension associated with diabetes ([place])  We adjusted dose a little today and she will return for bp check   9. Major depression in remission (HCC)  - venlafaxine XR (EFFEXOR-XR) 150 MG 24 hr capsule; Take 1 capsule (150 mg total) by mouth daily with breakfast.  Dispense: 90 capsule; Refill: 1  10. Diabetes mellitus with coincident hypertension (HCC)  - carvedilol (COREG) 6.25 MG tablet; Take 0.5 tablets (3.125 mg total) by mouth 2 (two) times daily.  Dispense: 180 tablet; Refill: 1 - Olmesartan-amLODIPine-HCTZ 40-5-25 MG TABS; Take 1 tablet by mouth daily.  Dispense: 90 tablet; Refill: 1  11. Diabetes mellitus with microalbuminuria (HCC)  - Olmesartan-amLODIPine-HCTZ 40-5-25 MG TABS; Take 1 tablet by mouth daily.  Dispense: 90 tablet; Refill: 1

## 2020-01-11 DIAGNOSIS — H40053 Ocular hypertension, bilateral: Secondary | ICD-10-CM | POA: Diagnosis not present

## 2020-01-11 DIAGNOSIS — H40023 Open angle with borderline findings, high risk, bilateral: Secondary | ICD-10-CM | POA: Diagnosis not present

## 2020-01-12 ENCOUNTER — Other Ambulatory Visit: Payer: Self-pay | Admitting: Family Medicine

## 2020-01-12 DIAGNOSIS — I10 Essential (primary) hypertension: Secondary | ICD-10-CM

## 2020-01-12 DIAGNOSIS — R809 Proteinuria, unspecified: Secondary | ICD-10-CM

## 2020-01-12 DIAGNOSIS — E119 Type 2 diabetes mellitus without complications: Secondary | ICD-10-CM

## 2020-01-12 DIAGNOSIS — E1129 Type 2 diabetes mellitus with other diabetic kidney complication: Secondary | ICD-10-CM

## 2020-01-17 ENCOUNTER — Other Ambulatory Visit: Payer: Self-pay | Admitting: Family Medicine

## 2020-01-17 DIAGNOSIS — M5416 Radiculopathy, lumbar region: Secondary | ICD-10-CM | POA: Diagnosis not present

## 2020-01-17 DIAGNOSIS — M259 Joint disorder, unspecified: Secondary | ICD-10-CM | POA: Diagnosis not present

## 2020-01-17 DIAGNOSIS — E785 Hyperlipidemia, unspecified: Secondary | ICD-10-CM

## 2020-01-17 DIAGNOSIS — M5136 Other intervertebral disc degeneration, lumbar region: Secondary | ICD-10-CM | POA: Diagnosis not present

## 2020-01-17 DIAGNOSIS — G894 Chronic pain syndrome: Secondary | ICD-10-CM | POA: Diagnosis not present

## 2020-01-27 DIAGNOSIS — E1165 Type 2 diabetes mellitus with hyperglycemia: Secondary | ICD-10-CM | POA: Diagnosis not present

## 2020-01-27 DIAGNOSIS — Z794 Long term (current) use of insulin: Secondary | ICD-10-CM | POA: Diagnosis not present

## 2020-02-04 ENCOUNTER — Telehealth: Payer: Self-pay | Admitting: Family Medicine

## 2020-02-04 DIAGNOSIS — E785 Hyperlipidemia, unspecified: Secondary | ICD-10-CM

## 2020-02-04 NOTE — Telephone Encounter (Signed)
Patient requesting call from clinical staff to discuss why this medication was denied.

## 2020-02-05 MED ORDER — PRAVASTATIN SODIUM 40 MG PO TABS: 40.0000 mg | ORAL_TABLET | Freq: Every day | ORAL | 3 refills | Status: DC

## 2020-02-05 NOTE — Addendum Note (Signed)
Addended by: Steele Sizer F on: 02/05/2020 04:17 PM   Modules accepted: Orders

## 2020-02-14 DIAGNOSIS — G894 Chronic pain syndrome: Secondary | ICD-10-CM | POA: Diagnosis not present

## 2020-02-14 DIAGNOSIS — M259 Joint disorder, unspecified: Secondary | ICD-10-CM | POA: Diagnosis not present

## 2020-02-14 DIAGNOSIS — I119 Hypertensive heart disease without heart failure: Secondary | ICD-10-CM | POA: Diagnosis not present

## 2020-02-14 DIAGNOSIS — M47897 Other spondylosis, lumbosacral region: Secondary | ICD-10-CM | POA: Diagnosis not present

## 2020-02-14 DIAGNOSIS — E1142 Type 2 diabetes mellitus with diabetic polyneuropathy: Secondary | ICD-10-CM | POA: Diagnosis not present

## 2020-02-14 DIAGNOSIS — M25519 Pain in unspecified shoulder: Secondary | ICD-10-CM | POA: Diagnosis not present

## 2020-02-14 DIAGNOSIS — R609 Edema, unspecified: Secondary | ICD-10-CM | POA: Diagnosis not present

## 2020-02-14 DIAGNOSIS — R5382 Chronic fatigue, unspecified: Secondary | ICD-10-CM | POA: Diagnosis not present

## 2020-02-14 DIAGNOSIS — G8929 Other chronic pain: Secondary | ICD-10-CM | POA: Diagnosis not present

## 2020-02-14 DIAGNOSIS — M545 Low back pain: Secondary | ICD-10-CM | POA: Diagnosis not present

## 2020-03-13 DIAGNOSIS — M179 Osteoarthritis of knee, unspecified: Secondary | ICD-10-CM | POA: Diagnosis not present

## 2020-03-13 DIAGNOSIS — M545 Low back pain: Secondary | ICD-10-CM | POA: Diagnosis not present

## 2020-03-13 DIAGNOSIS — M169 Osteoarthritis of hip, unspecified: Secondary | ICD-10-CM | POA: Diagnosis not present

## 2020-03-13 DIAGNOSIS — G894 Chronic pain syndrome: Secondary | ICD-10-CM | POA: Diagnosis not present

## 2020-03-13 DIAGNOSIS — M5136 Other intervertebral disc degeneration, lumbar region: Secondary | ICD-10-CM | POA: Diagnosis not present

## 2020-03-16 DIAGNOSIS — E1165 Type 2 diabetes mellitus with hyperglycemia: Secondary | ICD-10-CM | POA: Diagnosis not present

## 2020-03-16 DIAGNOSIS — I152 Hypertension secondary to endocrine disorders: Secondary | ICD-10-CM | POA: Diagnosis not present

## 2020-03-16 DIAGNOSIS — Z794 Long term (current) use of insulin: Secondary | ICD-10-CM | POA: Diagnosis not present

## 2020-03-16 DIAGNOSIS — E1169 Type 2 diabetes mellitus with other specified complication: Secondary | ICD-10-CM | POA: Diagnosis not present

## 2020-03-16 DIAGNOSIS — E1159 Type 2 diabetes mellitus with other circulatory complications: Secondary | ICD-10-CM | POA: Diagnosis not present

## 2020-04-10 DIAGNOSIS — M5416 Radiculopathy, lumbar region: Secondary | ICD-10-CM | POA: Diagnosis not present

## 2020-04-10 DIAGNOSIS — M5136 Other intervertebral disc degeneration, lumbar region: Secondary | ICD-10-CM | POA: Diagnosis not present

## 2020-04-10 DIAGNOSIS — M259 Joint disorder, unspecified: Secondary | ICD-10-CM | POA: Diagnosis not present

## 2020-04-10 DIAGNOSIS — M545 Low back pain: Secondary | ICD-10-CM | POA: Diagnosis not present

## 2020-04-11 DIAGNOSIS — G894 Chronic pain syndrome: Secondary | ICD-10-CM | POA: Diagnosis not present

## 2020-04-26 DIAGNOSIS — Z794 Long term (current) use of insulin: Secondary | ICD-10-CM | POA: Diagnosis not present

## 2020-04-26 DIAGNOSIS — E1165 Type 2 diabetes mellitus with hyperglycemia: Secondary | ICD-10-CM | POA: Diagnosis not present

## 2020-05-01 ENCOUNTER — Other Ambulatory Visit: Payer: Self-pay | Admitting: Family Medicine

## 2020-05-01 DIAGNOSIS — I10 Essential (primary) hypertension: Secondary | ICD-10-CM

## 2020-05-01 DIAGNOSIS — E119 Type 2 diabetes mellitus without complications: Secondary | ICD-10-CM

## 2020-05-05 ENCOUNTER — Other Ambulatory Visit: Payer: Self-pay | Admitting: Family Medicine

## 2020-05-05 DIAGNOSIS — I10 Essential (primary) hypertension: Secondary | ICD-10-CM

## 2020-05-07 ENCOUNTER — Other Ambulatory Visit: Payer: Self-pay | Admitting: Family Medicine

## 2020-05-07 DIAGNOSIS — E119 Type 2 diabetes mellitus without complications: Secondary | ICD-10-CM

## 2020-05-07 DIAGNOSIS — I10 Essential (primary) hypertension: Secondary | ICD-10-CM

## 2020-05-08 ENCOUNTER — Other Ambulatory Visit: Payer: Self-pay | Admitting: Family Medicine

## 2020-05-08 DIAGNOSIS — I10 Essential (primary) hypertension: Secondary | ICD-10-CM

## 2020-05-08 DIAGNOSIS — M179 Osteoarthritis of knee, unspecified: Secondary | ICD-10-CM | POA: Diagnosis not present

## 2020-05-08 DIAGNOSIS — M5136 Other intervertebral disc degeneration, lumbar region: Secondary | ICD-10-CM | POA: Diagnosis not present

## 2020-05-08 DIAGNOSIS — G894 Chronic pain syndrome: Secondary | ICD-10-CM | POA: Diagnosis not present

## 2020-05-08 DIAGNOSIS — M169 Osteoarthritis of hip, unspecified: Secondary | ICD-10-CM | POA: Diagnosis not present

## 2020-05-08 DIAGNOSIS — E119 Type 2 diabetes mellitus without complications: Secondary | ICD-10-CM

## 2020-05-08 NOTE — Telephone Encounter (Signed)
Requested medication (s) are due for refill today:  Yes  Requested medication (s) are on the active medication list:  Yes  Future visit scheduled:  Yes  Last Refill: 12/27/19 #180; refill x 1  Note to clinic: per last office note 12/27/19:   "HTN in DM:  she is compliant with medication and bp is at goal around 120's/60's-70's No chest pain or palpitation. She has intermittent  lower extremity edema. BP towards the low of normal and she does not like the lower extremity edema, we will go down on dose of Tribenzor from 40/10/25 to 40/5/25 and increase carvedilol to 6.25 mg BID , she will return for bp check one week after dose change".   Please update correct sig on medication; it still reflects pt. Is to take 0.5 tab. Bid, instead of 1 tab BID.  Pt reported she is out of medication.   Requested Prescriptions  Pending Prescriptions Disp Refills   carvedilol (COREG) 6.25 MG tablet 180 tablet 1    Sig: Take 0.5 tablets (3.125 mg total) by mouth 2 (two) times daily.      Cardiovascular:  Beta Blockers Passed - 05/08/2020  3:34 PM      Passed - Last BP in normal range    BP Readings from Last 1 Encounters:  12/27/19 (!) 120/50          Passed - Last Heart Rate in normal range    Pulse Readings from Last 1 Encounters:  12/27/19 (!) 106          Passed - Valid encounter within last 6 months    Recent Outpatient Visits           4 months ago Uncontrolled type 2 diabetes with peripheral autonomic neuropathy [facility])   [facility] Steele Sizer, MD   10 months ago Hypertension, benign   [facility] [place], Drue Stager, MD   12 months ago Allergic contact dermatitis due to other agents   [place], FNP   1 year ago Moderate episode of recurrent major depressive disorder [facility])   [facility] Steele Sizer, MD   2 years ago Type 2 diabetes, uncontrolled, with gastroparesis [facility])   [facility] Steele Sizer, MD       Future Appointments             In 2 months  [place]   In 2 months Steele Sizer, MD [facility], [facility]

## 2020-05-08 NOTE — Telephone Encounter (Signed)
Patient requested carvedilol (COREG) 6.25 MG tablet 2x and chart reflects "Request refused: Refill not appropriate (Dose has changed.)" patient states she's out and would like request expedited, please advise     [place], [place] Phone:  [phone]  Fax:  [phone]

## 2020-05-08 NOTE — Telephone Encounter (Signed)
Noted pt's Carvedilol dose was changed at office visit 12/27/19; previous dose was Carvedilol 6.25 mg, take 0.5 tab bid; new dose was ordered to increase to 6.25 mg tablet bid, and return in one week for BP check.  Attempted to call pt. To verify if she had increased the dosage to a full tablet, in 11/2019, as she is only recently requesting a refill.  Left vm to return call to office to verify which dose she was taking.

## 2020-05-08 NOTE — Telephone Encounter (Signed)
Patient is calling to tell Tiffany she is taking 3.125 mg. Please advise Cb- [phone]

## 2020-05-09 ENCOUNTER — Other Ambulatory Visit: Payer: Self-pay | Admitting: Family Medicine

## 2020-05-09 DIAGNOSIS — I10 Essential (primary) hypertension: Secondary | ICD-10-CM

## 2020-05-09 MED ORDER — CARVEDILOL 6.25 MG PO TABS: 3.1250 mg | ORAL_TABLET | Freq: Two times a day (BID) | ORAL | 1 refills | Status: DC

## 2020-05-09 MED ORDER — CARVEDILOL 3.125 MG PO TABS: 3.1250 mg | ORAL_TABLET | Freq: Two times a day (BID) | ORAL | 1 refills | Status: DC

## 2020-05-12 DIAGNOSIS — G894 Chronic pain syndrome: Secondary | ICD-10-CM | POA: Diagnosis not present

## 2020-05-27 DIAGNOSIS — Z794 Long term (current) use of insulin: Secondary | ICD-10-CM | POA: Diagnosis not present

## 2020-05-27 DIAGNOSIS — E1165 Type 2 diabetes mellitus with hyperglycemia: Secondary | ICD-10-CM | POA: Diagnosis not present

## 2020-06-06 DIAGNOSIS — G8929 Other chronic pain: Secondary | ICD-10-CM | POA: Diagnosis not present

## 2020-06-06 DIAGNOSIS — E1142 Type 2 diabetes mellitus with diabetic polyneuropathy: Secondary | ICD-10-CM | POA: Diagnosis not present

## 2020-06-06 DIAGNOSIS — M545 Low back pain: Secondary | ICD-10-CM | POA: Diagnosis not present

## 2020-06-06 DIAGNOSIS — G894 Chronic pain syndrome: Secondary | ICD-10-CM | POA: Diagnosis not present

## 2020-06-06 DIAGNOSIS — R609 Edema, unspecified: Secondary | ICD-10-CM | POA: Diagnosis not present

## 2020-06-06 DIAGNOSIS — R5382 Chronic fatigue, unspecified: Secondary | ICD-10-CM | POA: Diagnosis not present

## 2020-06-06 DIAGNOSIS — M47897 Other spondylosis, lumbosacral region: Secondary | ICD-10-CM | POA: Diagnosis not present

## 2020-06-06 DIAGNOSIS — I119 Hypertensive heart disease without heart failure: Secondary | ICD-10-CM | POA: Diagnosis not present

## 2020-06-06 DIAGNOSIS — M25559 Pain in unspecified hip: Secondary | ICD-10-CM | POA: Diagnosis not present

## 2020-06-06 DIAGNOSIS — M79609 Pain in unspecified limb: Secondary | ICD-10-CM | POA: Diagnosis not present

## 2020-06-06 DIAGNOSIS — M5136 Other intervertebral disc degeneration, lumbar region: Secondary | ICD-10-CM | POA: Diagnosis not present

## 2020-06-06 DIAGNOSIS — M48062 Spinal stenosis, lumbar region with neurogenic claudication: Secondary | ICD-10-CM | POA: Diagnosis not present

## 2020-06-06 DIAGNOSIS — M5416 Radiculopathy, lumbar region: Secondary | ICD-10-CM | POA: Diagnosis not present

## 2020-06-06 DIAGNOSIS — M179 Osteoarthritis of knee, unspecified: Secondary | ICD-10-CM | POA: Diagnosis not present

## 2020-06-06 DIAGNOSIS — M9951 Intervertebral disc stenosis of neural canal of cervical region: Secondary | ICD-10-CM | POA: Diagnosis not present

## 2020-06-06 DIAGNOSIS — M25519 Pain in unspecified shoulder: Secondary | ICD-10-CM | POA: Diagnosis not present

## 2020-06-06 DIAGNOSIS — M792 Neuralgia and neuritis, unspecified: Secondary | ICD-10-CM | POA: Diagnosis not present

## 2020-06-20 ENCOUNTER — Other Ambulatory Visit: Payer: Self-pay | Admitting: Family Medicine

## 2020-06-20 DIAGNOSIS — Z1231 Encounter for screening mammogram for malignant neoplasm of breast: Secondary | ICD-10-CM

## 2020-07-02 ENCOUNTER — Other Ambulatory Visit: Payer: Self-pay | Admitting: Family Medicine

## 2020-07-02 DIAGNOSIS — IMO0002 Reserved for concepts with insufficient information to code with codable children: Secondary | ICD-10-CM

## 2020-07-02 NOTE — Telephone Encounter (Signed)
Requested medication (s) are due for refill today: yes  Requested medication (s) are on the active medication list: yes  Last refill:  11/29/19  Future visit scheduled: yes  Notes to clinic:  med not delegated to NT to RF   Requested Prescriptions  Pending Prescriptions Disp Refills   metoCLOPramide (REGLAN) 5 MG tablet [Pharmacy Med Name: METOCLOPRAMIDE 5 MG TABLET] 90 tablet 0    Sig: TAKE 1 TABLET BY MOUTH THREE TIMES A DAY      Not Delegated - Gastroenterology: Antiemetics Failed - 07/02/2020  2:41 PM      Failed - This refill cannot be delegated      Failed - Valid encounter within last 6 months    Recent Outpatient Visits           6 months ago Uncontrolled type 2 diabetes with peripheral autonomic neuropathy [facility])   [facility] Steele Sizer, MD   1 year ago Hypertension, benign   [facility] Steele Sizer, MD   1 year ago Allergic contact dermatitis due to other agents   [place], FNP   1 year ago Moderate episode of recurrent major depressive disorder [facility])   [facility] [place], Drue Stager, MD   2 years ago Type 2 diabetes, uncontrolled, with gastroparesis [facility])   [facility] Steele Sizer, MD       Future Appointments             In 1 week  [facility], [place]   In 1 week Steele Sizer, MD [facility], [facility]

## 2020-07-03 DIAGNOSIS — M179 Osteoarthritis of knee, unspecified: Secondary | ICD-10-CM | POA: Diagnosis not present

## 2020-07-03 DIAGNOSIS — M5136 Other intervertebral disc degeneration, lumbar region: Secondary | ICD-10-CM | POA: Diagnosis not present

## 2020-07-03 DIAGNOSIS — G894 Chronic pain syndrome: Secondary | ICD-10-CM | POA: Diagnosis not present

## 2020-07-03 DIAGNOSIS — M169 Osteoarthritis of hip, unspecified: Secondary | ICD-10-CM | POA: Diagnosis not present

## 2020-07-10 NOTE — Progress Notes (Signed)
Name: Lorraine Jones   MRN: [id number]    DOB: May 04, 1951   Date:07/11/2020       Progress Note  Subjective  Chief Complaint  Chief Complaint  Patient presents with  . Follow-up  . Diabetes  . Hypertension    HPI  DMII with neuropathy,dyslipidemia and gastroparesis, microalbuminuriaand mild diabetic retinopathy (diagnosed July 2020), she already has a follow up with eye doctor coming up. She is now under the care of Dr. Honor Junes, reminded her to let CMA know - so we don't check her A1C in our office. She is now on Metformin and insulin R and N typesLong acting 32 units BID and pre meal is 28 units qacShe has [employer] but is going back to learn how to use it. She states not longer craving sweets as much She denies polydipsia or polyphagia, or polyuria , she has intermittent symptoms of neuropathy, described as aching sensation on her feet but under control with medication for pain.  She is taking statins and aspirin daily also on ARB, due for repeat urine micro She states indigestion is under control with Reglan before meals. She is back on Pravastatin because she did not like Rosuvastatin, we will recheck lipid panel today   HTNin DM: she is compliant with medication and bp is at goal, she has not been checking bp at home lately. No chest pain or palpitation.She is on lower dose of Tribenzor and has noticed improvement of lower extremity edema  Chronic Pain: she sees Dr. Primus Bravo at least once a month, taking pain medication as prescribed, she has constipation secondary to narcotics and is taking Miralaxotcto control symptoms, she has bowel movements ever other day, but not straining .Shestill did not have hip replacement, she needs to wait until A1C is below 8, A1C today is 8.1 %, she states she is scared. She states still using a cane when out of the house to prevent falls since when the pain is intense it radiation down both thighs and she feels weak. Pain is like a  nagging toothache  Hyperlipidemia: She stopped Crestor and went back on Pravastatin because of side effects.  She denies myalgias secondary to medication. No chest pain.She is due for labs   Osteopenia: she had a bone density back in 03/2016 , her FRAX score is 3.2 % for major osteoporotic fracture in the next 10 years and 0.4 for hip .She is taking calcium plus D,last bone density was done 06/2019 and normal spine, mild osteopenia -1.2 and FRAX score was 3.3 % all fractures and 0.4 % for hip fracture no need for medication at this time. She is trying to eat a high calcium diet   Obesity: BMI above 35 with co-,morbidities, discussed importance of weight loss. She avoiding sweets, but gained weight, explained importance of trying to do some water activity   Depressionin Remission:  she has a long of major depression, part of it secondary to living in pain and inability to do things she likes. She states Effexorand is thankful to be alive every day. She recently lost her grandson, he was born prematurely and died 19 months later in the NICU - it just happened last week,she states coping well, supporting her daughter. Explained it is okay to grieve .    Patient Active Problem List   Diagnosis Date Noted  . 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 2 diabetes mellitus ([place]) 04/06/2019  . Morbid obesity ([place]) 12/01/2018  .  Primary osteoarthritis of both hips 02/06/2018  . Hypertension associated with diabetes ([place]) 07/23/2017  . Hyperlipidemia due to type 2 diabetes mellitus ([place]) 07/23/2017  . Major depression, recurrent ([place]) 06/25/2016  . Osteopenia 04/18/2016  . Gastroesophageal reflux disease without esophagitis 05/12/2015  . Chronic pain 05/12/2015  . Hyperlipidemia 05/12/2015  . Lumbar post-laminectomy syndrome 02/27/2015  . Neuropathy due to secondary diabetes ([place]) 02/27/2015  . Spinal stenosis, lumbar region, with  neurogenic claudication 02/27/2015  . Sacroiliac joint dysfunction of both sides 02/27/2015  . Degenerative joint disease (DJD) of hip 02/27/2015  . DDD (degenerative disc disease), cervical 02/27/2015  . Bilateral occipital neuralgia 02/27/2015  . Chronic constipation 06/02/2014  . Hyponatremia 06/01/2014  . Hypertension, benign 06/01/2014    Past Surgical History:  Procedure Laterality Date  . CHOLECYSTECTOMY  1996  . CYSTOSCOPY WITH URETEROSCOPY Right 09/01/2013   Procedure: CYSTOSCOPY WITH UNROOFING  RIGHT URETEROCELE AND URETERAL STONE REMOVED  WITH GYRUS COLLINS KNIFE. ;  Surgeon: Irine Seal, MD;  Location: [place];  Service: Urology;  Laterality: Right;  cysto, right uretersoscopy and stone extraction   collins knife  . LUMBAR DISC SURGERY  1993   L4  --  L5    Family History  Problem Relation Age of Onset  . Diabetes Mother   . Heart disease Mother   . Hypertension Mother   . Breast cancer Neg Hx     Social History   Tobacco Use  . Smoking status: Current Every Day Smoker    Packs/day: 0.50    Years: 20.00    Pack years: 10.00    Types: Cigarettes  . Smokeless tobacco: Never Used  . Tobacco comment: she is cutting down   Substance Use Topics  . Alcohol use: No    Alcohol/week: 0.0 standard drinks     Current Outpatient Medications:  .  aspirin EC 81 MG tablet, Take 81 mg by mouth daily., Disp: , Rfl:  .  Blood Glucose Monitoring Suppl (ONETOUCH VERIO) w/Device KIT, USE TO TEST BLOOD SUGAR, Disp: , Rfl:  .  carvedilol (COREG) 3.125 MG tablet, Take 1 tablet (3.125 mg total) by mouth 2 (two) times daily., Disp: 180 tablet, Rfl: 1 .  Continuous Blood Gluc Receiver (FREESTYLE LIBRE 14 DAY READER) DEVI, Use 1 each as needed .  Use to test blood sugar, Disp: , Rfl:  .  Continuous Blood Gluc Sensor (FREESTYLE LIBRE 14 DAY SENSOR) MISC, Use 1 each every 14 (fourteen) days, Disp: , Rfl:  .  Cysteamine Bitartrate (PROCYSBI) 300 MG PACK, Use 1 each 3  (three) times daily Use as instructed THREE TIMES DAILY. E11.65., Disp: , Rfl:  .  dorzolamide-timolol (COSOPT) 22.3-6.8 MG/ML ophthalmic solution, Place 1 drop into both eyes 2 (two) times daily., Disp: , Rfl:  .  fentaNYL (DURAGESIC - DOSED MCG/HR) 75 MCG/HR, Apply 2 patches to skin every 2 days if tolerated.   NOTE: Do not apply 100 g per hour fentanyl patch since insurance will not approve 100 g patch for you, Disp: 30 patch, Rfl: 0 .  glucose blood (ONE TOUCH ULTRA TEST) test strip, 1 strip by Other route 2 (two) times daily., Disp: , Rfl:  .  metFORMIN (GLUCOPHAGE) 1000 MG tablet, Take 1 tablet (1,000 mg total) by mouth 2 (two) times daily with a meal., Disp: 180 tablet, Rfl: 1 .  metoCLOPramide (REGLAN) 5 MG tablet, TAKE 1 TABLET BY MOUTH THREE TIMES A DAY, Disp: 90 tablet, Rfl: 0 .  Multiple Vitamins-Minerals (  MULTIVITAMIN WITH MINERALS) tablet, Take 1 tablet by mouth daily., Disp: , Rfl:  .  NOVOLIN N RELION 100 UNIT/ML injection, INJECT 28 UNITS SUBCUTANEOUSLY TWO TIMES DAILY., Disp: , Rfl:  .  NOVOLIN R RELION 100 UNIT/ML injection, INJECT 20 UNITS SUBCUTANEOUSLY THREE TIMES DAILY BEFORE MEAL(S) (ADD SLIDING SCALE AS NECESSARY), Disp: , Rfl:  .  Olmesartan-amLODIPine-HCTZ 40-5-25 MG TABS, Take 1 tablet by mouth daily., Disp: 90 tablet, Rfl: 1 .  Oxycodone HCl 20 MG TABS, LIMIT 1/2 TO 1 TABLET BY MOUTH 3 TO 5 TIMES PER DAY IF TOLERATED *NOTE TABLET IS TWICE AS STRONG*, Disp: , Rfl:  .  polyethylene glycol (MIRALAX / GLYCOLAX) packet, Take 17 g by mouth daily., Disp: 14 each, Rfl: 0 .  pravastatin (PRAVACHOL) 40 MG tablet, Take 1 tablet (40 mg total) by mouth daily., Disp: 90 tablet, Rfl: 3 .  triamcinolone cream (KENALOG) 0.1 %, Apply 1 application topically 2 (two) times daily., Disp: 453.6 g, Rfl: 0 .  venlafaxine XR (EFFEXOR-XR) 150 MG 24 hr capsule, Take 1 capsule (150 mg total) by mouth daily with breakfast., Disp: 90 capsule, Rfl: 1  Allergies  Allergen Reactions  . Diclofenac  Sodium Swelling  . Naproxen Sodium Swelling  . Etodolac Swelling  . Gabapentin Swelling  . Naproxen Swelling    I personally reviewed active problem list, medication list, allergies, family history, social history, health maintenance with the patient/caregiver today.   ROS  Constitutional: Negative for fever , positive for some  weight change.  Respiratory: Negative for cough and shortness of breath.   Cardiovascular: Negative for chest pain or palpitations.  Gastrointestinal: Negative for abdominal pain, no bowel changes.  Musculoskeletal: Positive for gait problem or joint swelling.  Skin: Negative for rash.  Neurological: Negative for dizziness or headache.  No other specific complaints in a complete review of systems (except as listed in HPI above)   Objective  Vitals:   07/11/20 0821  BP: 122/84  Pulse: 98  Resp: 14  Temp: 98.3 F (36.8 C)  TempSrc: Oral  SpO2: 97%  Weight: 194 lb 11.2 oz (88.3 kg)  Height: _0  (1.549 m)    Body mass index is 36.79 kg/m.  Physical Exam  Constitutional: Patient appears well-developed and well-nourished. Obese  No distress.  HEENT: head atraumatic, normocephalic, pupils equal and reactive to light,neck supple Cardiovascular: Normal rate, regular rhythm and normal heart sounds.  No murmur heard. No BLE edema. Pulmonary/Chest: Effort normal and breath sounds normal. No respiratory distress. Abdominal: Soft.  There is no tenderness. Muscular Skeletal: pain during palpation of right lower back, positive right straight leg raise  Psychiatric: Patient has a normal mood and affect. behavior is normal. Judgment and thought content normal.   Diabetic Foot Exam: Diabetic Foot Exam - Simple   Simple Foot Form Diabetic Foot exam was performed with the following findings: Yes 07/11/2020  8:59 AM  Visual Inspection See comments: Yes Sensation Testing Intact to touch and monofilament testing bilaterally: Yes Pulse Check Posterior  Tibialis and Dorsalis pulse intact bilaterally: Yes Comments Thick toenails     PHQ2/9: Depression screen [facility] 2/9 07/11/2020 07/11/2020 12/27/2019 06/28/2019 06/28/2019  Decreased Interest 0 0 0 0 0  Down, Depressed, Hopeless 0 0 1 - 0  PHQ - 2 Score 0 0 1 0 0  Altered sleeping - 0 0 0 0  Tired, decreased energy - 0 1 0 0  Change in appetite - 0 0 0 0  Feeling bad or failure about yourself  -  0 0 0 0  Trouble concentrating - 0 0 0 0  Moving slowly or fidgety/restless - 0 1 0 0  Suicidal thoughts - 0 0 0 0  PHQ-9 Score - 0 3 0 0  Difficult doing work/chores - Not difficult at all Not difficult at all Not difficult at all -  Some recent data might be hidden    phq 9 is negative   Fall Risk: Fall Risk  07/11/2020 07/11/2020 12/27/2019 06/28/2019 12/01/2018  Falls in the past year? 0 0 0 0 0  Comment - - - - -  Number falls in past yr: 0 0 0 0 0  Injury with Fall? 0 0 0 0 0  Risk for fall due to : Impaired balance/gait Impaired balance/gait - - -  Follow up Falls prevention discussed - - - Falls prevention discussed     Functional Status Survey: Is the patient deaf or have difficulty hearing?: No Does the patient have difficulty seeing, even when wearing glasses/contacts?: No Does the patient have difficulty concentrating, remembering, or making decisions?: No Does the patient have difficulty walking or climbing stairs?: Yes Does the patient have difficulty dressing or bathing?: No Does the patient have difficulty doing errands alone such as visiting a doctor's office or shopping?: No    Assessment & Plan  1. Diabetes mellitus with coincident hypertension (HCC)  - POCT HgB A1C - Olmesartan-amLODIPine-HCTZ 40-5-25 MG TABS; Take 1 tablet by mouth daily.  Dispense: 90 tablet; Refill: 1 - COMPLETE METABOLIC PANEL WITH GFR  2. Need for immunization against influenza  - Flu Vaccine QUAD High Dose(Fluad)  3. Dyslipidemia  - Lipid panel - COMPLETE METABOLIC PANEL WITH  GFR  4. Uncontrolled type 2 diabetes with peripheral autonomic neuropathy (HCC)  - Microalbumin / creatinine urine ratio  5. Chronic pain syndrome   6. Dyslipidemia associated with type 2 diabetes mellitus ([place])   7. Morbid obesity ([place])  Discussed with the patient the risk posed by an increased BMI. Discussed importance of portion control, calorie counting and at least 150 minutes of physical activity weekly. Avoid sweet beverages and drink more water. Eat at least 6 servings of fruit and vegetables daily   8. Major depression in remission (HCC)  - venlafaxine XR (EFFEXOR-XR) 150 MG 24 hr capsule; Take 1 capsule (150 mg total) by mouth daily with breakfast.  Dispense: 90 capsule; Refill: 1  9. Hypertension, benign  - Olmesartan-amLODIPine-HCTZ 40-5-25 MG TABS; Take 1 tablet by mouth daily.  Dispense: 90 tablet; Refill: 1  10. Diabetes mellitus with microalbuminuria (HCC)  - Olmesartan-amLODIPine-HCTZ 40-5-25 MG TABS; Take 1 tablet by mouth daily.  Dispense: 90 tablet; Refill: 1  11. Tobacco use

## 2020-07-11 ENCOUNTER — Ambulatory Visit (INDEPENDENT_AMBULATORY_CARE_PROVIDER_SITE_OTHER): Payer: Medicare Other | Admitting: Family Medicine

## 2020-07-11 ENCOUNTER — Other Ambulatory Visit: Payer: Self-pay

## 2020-07-11 ENCOUNTER — Encounter: Payer: Self-pay | Admitting: Family Medicine

## 2020-07-11 ENCOUNTER — Ambulatory Visit (INDEPENDENT_AMBULATORY_CARE_PROVIDER_SITE_OTHER): Payer: Medicare Other

## 2020-07-11 VITALS — BP 122/84 | HR 98 | Temp 98.3°F | Resp 14 | Ht 61.0 in | Wt 194.7 lb

## 2020-07-11 DIAGNOSIS — F325 Major depressive disorder, single episode, in full remission: Secondary | ICD-10-CM

## 2020-07-11 DIAGNOSIS — E1143 Type 2 diabetes mellitus with diabetic autonomic (poly)neuropathy: Secondary | ICD-10-CM

## 2020-07-11 DIAGNOSIS — I10 Essential (primary) hypertension: Secondary | ICD-10-CM | POA: Diagnosis not present

## 2020-07-11 DIAGNOSIS — Z23 Encounter for immunization: Secondary | ICD-10-CM | POA: Diagnosis not present

## 2020-07-11 DIAGNOSIS — Z0000 Encounter for general adult medical examination without abnormal findings: Secondary | ICD-10-CM

## 2020-07-11 DIAGNOSIS — E1165 Type 2 diabetes mellitus with hyperglycemia: Secondary | ICD-10-CM | POA: Diagnosis not present

## 2020-07-11 DIAGNOSIS — Z720 Tobacco use: Secondary | ICD-10-CM

## 2020-07-11 DIAGNOSIS — E785 Hyperlipidemia, unspecified: Secondary | ICD-10-CM | POA: Diagnosis not present

## 2020-07-11 DIAGNOSIS — E119 Type 2 diabetes mellitus without complications: Secondary | ICD-10-CM

## 2020-07-11 DIAGNOSIS — IMO0002 Reserved for concepts with insufficient information to code with codable children: Secondary | ICD-10-CM

## 2020-07-11 DIAGNOSIS — G894 Chronic pain syndrome: Secondary | ICD-10-CM

## 2020-07-11 DIAGNOSIS — E1169 Type 2 diabetes mellitus with other specified complication: Secondary | ICD-10-CM

## 2020-07-11 DIAGNOSIS — R809 Proteinuria, unspecified: Secondary | ICD-10-CM

## 2020-07-11 DIAGNOSIS — E1129 Type 2 diabetes mellitus with other diabetic kidney complication: Secondary | ICD-10-CM

## 2020-07-11 LAB — POCT GLYCOSYLATED HEMOGLOBIN (HGB A1C): Hemoglobin A1C: 8.1 % — AB (ref 4.0–5.6)

## 2020-07-11 MED ORDER — VENLAFAXINE HCL ER 150 MG PO CP24: 150.0000 mg | ORAL_CAPSULE | Freq: Every day | ORAL | 1 refills | Status: DC

## 2020-07-11 MED ORDER — OLMESARTAN-AMLODIPINE-HCTZ 40-5-25 MG PO TABS: 1.0000 | ORAL_TABLET | Freq: Every day | ORAL | 1 refills | Status: DC

## 2020-07-11 NOTE — Patient Instructions (Signed)
Lorraine Jones , Thank you for taking time to come for your Medicare Wellness Visit. I appreciate your ongoing commitment to your health goals. Please review the following plan we discussed and let me know if I can assist you in the future.   Screening recommendations/referrals: Colonoscopy: done 02/14/11. Repeat in 2022 Mammogram: done 06/18/19.  Scheduled for 07/18/20 Bone Density: done 06/18/19 Recommended yearly ophthalmology/optometry visit for glaucoma screening and checkup Recommended yearly dental visit for hygiene and checkup  Vaccinations: Influenza vaccine: done today Pneumococcal vaccine: done 06/28/19 Tdap vaccine: due Shingles vaccine: Shingrix discussed. Please contact your pharmacy for coverage information.  Covid-19:done 11/24/19 & 12/15/19  Advanced directives: Advance directive discussed with you today. I have provided a copy for you to complete at home and have notarized. Once this is complete please bring a copy in to our office so we can scan it into your chart.  Conditions/risks identified: Recommend increasing physical activity as tolerated  Next appointment: Follow up in one year for your annual wellness visit    Preventive Care 65 Years and Older, Female Preventive care refers to lifestyle choices and visits with your health care provider that can promote health and wellness. What does preventive care include?  A yearly physical exam. This is also called an annual well check.  Dental exams once or twice a year.  Routine eye exams. Ask your health care provider how often you should have your eyes checked.  Personal lifestyle choices, including:  Daily care of your teeth and gums.  Regular physical activity.  Eating a healthy diet.  Avoiding tobacco and drug use.  Limiting alcohol use.  Practicing safe sex.  Taking low-dose aspirin every day.  Taking vitamin and mineral supplements as recommended by your health care provider. What happens during an  annual well check? The services and screenings done by your health care provider during your annual well check will depend on your age, overall health, lifestyle risk factors, and family history of disease. Counseling  Your health care provider may ask you questions about your:  Alcohol use.  Tobacco use.  Drug use.  Emotional well-being.  Home and relationship well-being.  Sexual activity.  Eating habits.  History of falls.  Memory and ability to understand (cognition).  Work and work Statistician.  Reproductive health. Screening  You may have the following tests or measurements:  Height, weight, and BMI.  Blood pressure.  Lipid and cholesterol levels. These may be checked every 5 years, or more frequently if you are over 55 years old.  Skin check.  Lung cancer screening. You may have this screening every year starting at age 48 if you have a 30-pack-year history of smoking and currently smoke or have quit within the past 15 years.  Fecal occult blood test (FOBT) of the stool. You may have this test every year starting at age 47.  Flexible sigmoidoscopy or colonoscopy. You may have a sigmoidoscopy every 5 years or a colonoscopy every 10 years starting at age 45.  Hepatitis C blood test.  Hepatitis B blood test.  Sexually transmitted disease (STD) testing.  Diabetes screening. This is done by checking your blood sugar (glucose) after you have not eaten for a while (fasting). You may have this done every 1-3 years.  Bone density scan. This is done to screen for osteoporosis. You may have this done starting at age 82.  Mammogram. This may be done every 1-2 years. Talk to your health care provider about how often you should have  regular mammograms. Talk with your health care provider about your test results, treatment options, and if necessary, the need for more tests. Vaccines  Your health care provider may recommend certain vaccines, such as:  Influenza  vaccine. This is recommended every year.  Tetanus, diphtheria, and acellular pertussis (Tdap, Td) vaccine. You may need a Td booster every 10 years.  Zoster vaccine. You may need this after age 7.  Pneumococcal 13-valent conjugate (PCV13) vaccine. One dose is recommended after age 28.  Pneumococcal polysaccharide (PPSV23) vaccine. One dose is recommended after age 70. Talk to your health care provider about which screenings and vaccines you need and how often you need them. This information is not intended to replace advice given to you by your health care provider. Make sure you discuss any questions you have with your health care provider. Document Released: 10/13/2015 Document Revised: 06/05/2016 Document Reviewed: 07/18/2015 Elsevier Interactive Patient Education  2017 [employer] Prevention in the Home Falls can cause injuries. They can happen to people of all ages. There are many things you can do to make your home safe and to help prevent falls. What can I do on the outside of my home?  Regularly fix the edges of walkways and driveways and fix any cracks.  Remove anything that might make you trip as you walk through a door, such as a raised step or threshold.  Trim any bushes or trees on the path to your home.  Use bright outdoor lighting.  Clear any walking paths of anything that might make someone trip, such as rocks or tools.  Regularly check to see if handrails are loose or broken. Make sure that both sides of any steps have handrails.  Any raised decks and porches should have guardrails on the edges.  Have any leaves, snow, or ice cleared regularly.  Use sand or salt on walking paths during winter.  Clean up any spills in your garage right away. This includes oil or grease spills. What can I do in the bathroom?  Use night lights.  Install grab bars by the toilet and in the tub and shower. Do not use towel bars as grab bars.  Use non-skid mats or decals  in the tub or shower.  If you need to sit down in the shower, use a plastic, non-slip stool.  Keep the floor dry. Clean up any water that spills on the floor as soon as it happens.  Remove soap buildup in the tub or shower regularly.  Attach bath mats securely with double-sided non-slip rug tape.  Do not have throw rugs and other things on the floor that can make you trip. What can I do in the bedroom?  Use night lights.  Make sure that you have a light by your bed that is easy to reach.  Do not use any sheets or blankets that are too big for your bed. They should not hang down onto the floor.  Have a firm chair that has side arms. You can use this for support while you get dressed.  Do not have throw rugs and other things on the floor that can make you trip. What can I do in the kitchen?  Clean up any spills right away.  Avoid walking on wet floors.  Keep items that you use a lot in easy-to-reach places.  If you need to reach something above you, use a strong step stool that has a grab bar.  Keep electrical cords out of the  way.  Do not use floor polish or wax that makes floors slippery. If you must use wax, use non-skid floor wax.  Do not have throw rugs and other things on the floor that can make you trip. What can I do with my stairs?  Do not leave any items on the stairs.  Make sure that there are handrails on both sides of the stairs and use them. Fix handrails that are broken or loose. Make sure that handrails are as long as the stairways.  Check any carpeting to make sure that it is firmly attached to the stairs. Fix any carpet that is loose or worn.  Avoid having throw rugs at the top or bottom of the stairs. If you do have throw rugs, attach them to the floor with carpet tape.  Make sure that you have a light switch at the top of the stairs and the bottom of the stairs. If you do not have them, ask someone to add them for you. What else can I do to help  prevent falls?  Wear shoes that:  Do not have high heels.  Have rubber bottoms.  Are comfortable and fit you well.  Are closed at the toe. Do not wear sandals.  If you use a stepladder:  Make sure that it is fully opened. Do not climb a closed stepladder.  Make sure that both sides of the stepladder are locked into place.  Ask someone to hold it for you, if possible.  Clearly mark and make sure that you can see:  Any grab bars or handrails.  First and last steps.  Where the edge of each step is.  Use tools that help you move around (mobility aids) if they are needed. These include:  Canes.  Walkers.  Scooters.  Crutches.  Turn on the lights when you go into a dark area. Replace any light bulbs as soon as they burn out.  Set up your furniture so you have a clear path. Avoid moving your furniture around.  If any of your floors are uneven, fix them.  If there are any pets around you, be aware of where they are.  Review your medicines with your doctor. Some medicines can make you feel dizzy. This can increase your chance of falling. Ask your doctor what other things that you can do to help prevent falls. This information is not intended to replace advice given to you by your health care provider. Make sure you discuss any questions you have with your health care provider. Document Released: 07/13/2009 Document Revised: 02/22/2016 Document Reviewed: 10/21/2014 Elsevier Interactive Patient Education  2017 [employer].

## 2020-07-11 NOTE — Patient Instructions (Signed)

## 2020-07-11 NOTE — Progress Notes (Signed)
Subjective:   Lorraine Jones is a 69 y.o. female who presents for Medicare Annual (Subsequent) preventive examination.  Review of Systems     Cardiac Risk Factors include: advanced age (>51mn, >>67women);diabetes mellitus;dyslipidemia;hypertension;smoking/ tobacco exposure;sedentary lifestyle;obesity (BMI >30kg/m2)     Objective:    Today's Vitals   07/11/20 0826 07/11/20 0827  BP: 122/84   Pulse: 98   Resp: 14   Temp: 98.3 F (36.8 C)   TempSrc: Oral   SpO2: 97%   Weight: 194 lb 11.2 oz (88.3 kg)   Height: 5' 1" (1.549 m)   PainSc:  4    Body mass index is 36.79 kg/m.  Advanced Directives 07/11/2020 12/01/2018 05/16/2017 04/04/2017 12/23/2016 06/25/2016 06/10/2016  Does Patient Have a Medical Advance Directive? _0  No No  Would patient like information on creating a medical advance directive? Yes (MAU/Ambulatory/Procedural Areas - Information given) Yes (MAU/Ambulatory/Procedural Areas - Information given) - - - - -  Pre-existing out of facility DNR order (yellow form or pink MOST form) - - - - - - -    Current Medications (verified) Outpatient Encounter Medications as of 07/11/2020  Medication Sig  . aspirin EC 81 MG tablet Take 81 mg by mouth daily.  . Blood Glucose Monitoring Suppl (ONETOUCH VERIO) w/Device KIT USE TO TEST BLOOD SUGAR  . carvedilol (COREG) 3.125 MG tablet Take 1 tablet (3.125 mg total) by mouth 2 (two) times daily.  . Continuous Blood Gluc Receiver (FREESTYLE LIBRE 14 DAY READER) DEVI Use 1 each as needed .  Use to test blood sugar  . Continuous Blood Gluc Sensor (FREESTYLE LIBRE 14 DAY SENSOR) MISC Use 1 each every 14 (fourteen) days  . Cysteamine Bitartrate (PROCYSBI) 300 MG PACK Use 1 each 3 (three) times daily Use as instructed THREE TIMES DAILY. E11.65.  .Marland Kitchendorzolamide-timolol (COSOPT) 22.3-6.8 MG/ML ophthalmic solution Place 1 drop into both eyes 2 (two) times daily.  . fentaNYL (DURAGESIC - DOSED MCG/HR) 75 MCG/HR Apply 2 patches to skin  every 2 days if tolerated.   NOTE: Do not apply 100 g per hour fentanyl patch since insurance will not approve 100 g patch for you  . glucose blood (ONE TOUCH ULTRA TEST) test strip 1 strip by Other route 2 (two) times daily.  . metFORMIN (GLUCOPHAGE) 1000 MG tablet Take 1 tablet (1,000 mg total) by mouth 2 (two) times daily with a meal.  . metoCLOPramide (REGLAN) 5 MG tablet TAKE 1 TABLET BY MOUTH THREE TIMES A DAY  . Multiple Vitamins-Minerals (MULTIVITAMIN WITH MINERALS) tablet Take 1 tablet by mouth daily.  .Marland KitchenNOVOLIN N RELION 100 UNIT/ML injection INJECT 28 UNITS SUBCUTANEOUSLY TWO TIMES DAILY.  .Marland KitchenNOVOLIN R RELION 100 UNIT/ML injection INJECT 20 UNITS SUBCUTANEOUSLY THREE TIMES DAILY BEFORE MEAL(S) (ADD SLIDING SCALE AS NECESSARY)  . Olmesartan-amLODIPine-HCTZ 40-5-25 MG TABS Take 1 tablet by mouth daily.  . Oxycodone HCl 20 MG TABS LIMIT 1/2 TO 1 TABLET BY MOUTH 3 TO 5 TIMES PER DAY IF TOLERATED *NOTE TABLET IS TWICE AS STRONG*  . polyethylene glycol (MIRALAX / GLYCOLAX) packet Take 17 g by mouth daily.  . pravastatin (PRAVACHOL) 40 MG tablet Take 1 tablet (40 mg total) by mouth daily.  .Marland Kitchentriamcinolone cream (KENALOG) 0.1 % Apply 1 application topically 2 (two) times daily.  .Marland Kitchenvenlafaxine XR (EFFEXOR-XR) 150 MG 24 hr capsule Take 1 capsule (150 mg total) by mouth daily with breakfast.   No facility-administered encounter medications on file as of 07/11/2020.  Allergies (verified) Diclofenac sodium, Naproxen sodium, Etodolac, Gabapentin, and Naproxen   History: Past Medical History:  Diagnosis Date  . Arthritis   . Chronic low back pain   . Depression   . Diabetes mellitus   . Hyperlipidemia   . Hypertension    Past Surgical History:  Procedure Laterality Date  . CHOLECYSTECTOMY  1996  . CYSTOSCOPY WITH URETEROSCOPY Right 09/01/2013   Procedure: CYSTOSCOPY WITH UNROOFING  RIGHT URETEROCELE AND URETERAL STONE REMOVED  WITH GYRUS COLLINS KNIFE. ;  Surgeon: Irine Seal, MD;   Location: [facility];  Service: Urology;  Laterality: Right;  cysto, right uretersoscopy and stone extraction   collins knife  . LUMBAR DISC SURGERY  1993   L4  --  L5   Family History  Problem Relation Age of Onset  . Diabetes Mother   . Heart disease Mother   . Hypertension Mother   . Breast cancer Neg Hx    Social History   Socioeconomic History  . Marital status: Married    Spouse name: Not on file  . Number of children: 3  . Years of education: Not on file  . Highest education level: 12th grade  Occupational History  . Occupation: retired  Tobacco Use  . Smoking status: Current Every Day Smoker    Packs/day: 0.50    Years: 20.00    Pack years: 10.00    Types: Cigarettes  . Smokeless tobacco: Never Used  . Tobacco comment: she is cutting down   Vaping Use  . Vaping Use: Never used  Substance and Sexual Activity  . Alcohol use: No    Alcohol/week: 0.0 standard drinks  . Drug use: No  . Sexual activity: Yes    Partners: Male  Other Topics Concern  . Not on file  Social History Narrative  . Not on file   Social Determinants of Health   Financial Resource Strain: Low Risk   . Difficulty of Paying Living Expenses: Not very hard  Food Insecurity: No Food Insecurity  . Worried About Charity fundraiser in the Last Year: Never true  . Ran Out of Food in the Last Year: Never true  Transportation Needs: No Transportation Needs  . Lack of Transportation (Medical): No  . Lack of Transportation (Non-Medical): No  Physical Activity: Inactive  . Days of Exercise per Week: 0 days  . Minutes of Exercise per Session: 0 min  Stress: No Stress Concern Present  . Feeling of Stress : Not at all  Social Connections: Moderately Integrated  . Frequency of Communication with Friends and Family: More than three times a week  . Frequency of Social Gatherings with Friends and Family: Three times a week  . Attends Religious Services: More than 4 times per year  .  Active Member of Clubs or Organizations: No  . Attends Archivist Meetings: Never  . Marital Status: Married    Tobacco Counseling Ready to quit: Not Answered Counseling given: Not Answered Comment: she is cutting down    Clinical Intake:  Pre-visit preparation completed: Yes  Pain : 0-10 Pain Score: 4  Pain Type: Chronic pain Pain Location: Back Pain Orientation: Lower Pain Descriptors / Indicators: Aching, Sore Pain Onset: More than a month ago Pain Frequency: Constant     BMI - recorded: 36.79 Nutritional Status: BMI > 30  Obese Nutritional Risks: None Diabetes: Yes CBG done?: No Did pt. bring in CBG monitor from home?: No  How often do you need  to have someone help you when you read instructions, pamphlets, or other written materials from your doctor or pharmacy?: 1 - Never  Nutrition Risk Assessment:  Has the patient had any N/V/D within the last 2 months?  No  Does the patient have any non-healing wounds?  No  Has the patient had any unintentional weight loss or weight gain?  No   Diabetes:  Is the patient diabetic?  Yes  If diabetic, was a CBG obtained today?  No  Did the patient bring in their glucometer from home?  No  How often do you monitor your CBG's? Twice weekly; pt did just receive freestyle libre and awaiting instructions on how to use it from endocrinology .   Financial Strains and Diabetes Management:  Are you having any financial strains with the device, your supplies or your medication? No .  Does the patient want to be seen by Chronic Care Management for management of their diabetes?  No  Would the patient like to be referred to a Nutritionist or for Diabetic Management?  No   Diabetic Exams:  Diabetic Eye Exam: Completed 04/06/19. Overdue for diabetic eye exam. Pt scheduled for 07/13/20.  Diabetic Foot Exam: Completed 06/28/19. Pt has been advised about the importance in completing this exam.   Interpreter Needed?:  No  Information entered by :: [name] LPN   Activities of Daily Living In your present state of health, do you have any difficulty performing the following activities: 07/11/2020 07/11/2020  Hearing? N N  Comment declines hearing aids -  Vision? N N  Difficulty concentrating or making decisions? N N  Walking or climbing stairs? N Y  Dressing or bathing? N N  Doing errands, shopping? N N  Preparing Food and eating ? N -  Using the Toilet? N -  In the past six months, have you accidently leaked urine? N -  Do you have problems with loss of bowel control? N -  Managing your Medications? N -  Managing your Finances? N -  Housekeeping or managing your Housekeeping? N -  Some recent data might be hidden    Patient Care Team: Steele Sizer, MD as PCP - General Lonia Farber, MD as Consulting Physician (Internal Medicine) Meade Maw, MD as Consulting Physician (Neurosurgery) Mohammed Kindle, MD as Referring Physician (Pain Medicine) Vladimir Crofts, MD as Consulting Physician (Neurology)  Indicate any recent Medical Services you may have received from other than [facility] providers in the past year (date may be approximate).     Assessment:   This is a routine wellness examination for [facility].  Hearing/Vision screen  Hearing Screening   125Hz 250Hz 500Hz 1000Hz 2000Hz 3000Hz 4000Hz 6000Hz 8000Hz  Right ear:           Left ear:           Comments: Pt has no difficulty hearing   Vision Screening Comments: Annual vision screenings done at [facility]  Dietary issues and exercise activities discussed: Current Exercise Habits: The patient does not participate in regular exercise at present, Exercise limited by: orthopedic condition(s)  Goals    . DIET - INCREASE WATER INTAKE     Recommend drinking 6-8 glasses of water per day      Depression Screen PHQ 2/9 Scores 07/11/2020 07/11/2020 12/27/2019 06/28/2019 06/28/2019 05/11/2019 12/01/2018  PHQ - 2 Score 0 0 1  0 0 0 0  PHQ- 9 Score - - 3 0 0 0 2  Exception Documentation - - - - - - -  Fall Risk Fall Risk  07/11/2020 07/11/2020 12/27/2019 06/28/2019 12/01/2018  Falls in the past year? 0 0 0 0 0  Comment - - - - -  Number falls in past yr: 0 0 0 0 0  Injury with Fall? 0 0 0 0 0  Risk for fall due to : Impaired balance/gait Impaired balance/gait - - -  Follow up Falls prevention discussed - - - Falls prevention discussed    Any stairs in or around the home? Yes  If so, are there any without handrails? No  Home free of loose throw rugs in walkways, pet beds, electrical cords, etc? Yes  Adequate lighting in your home to reduce risk of falls? Yes   ASSISTIVE DEVICES UTILIZED TO PREVENT FALLS:  Life alert? No  Use of a cane, walker or w/c? Yes  Grab bars in the bathroom? Yes  Shower chair or bench in shower? No  Elevated toilet seat or a handicapped toilet? Yes  TIMED UP AND GO:  Was the test performed? Yes .  Length of time to ambulate 10 feet: 7 sec.   Gait slow and steady with assistive device  Cognitive Function:     6CIT Screen 07/11/2020 12/01/2018  What Year? 0 points 0 points  What month? 0 points 0 points  What time? 0 points 0 points  Count back from 20 0 points 0 points  Months in reverse 0 points 0 points  Repeat phrase 6 points 0 points  Total Score 6 0    Immunizations Immunization History  Administered Date(s) Administered  . Fluad Quad(high Dose 65+) 06/28/2019, 07/11/2020  . Influenza, High Dose Seasonal PF 06/25/2016, 06/30/2017, 12/01/2018  . Influenza,inj,Quad PF,6+ Mos 06/19/2015  . Influenza-Unspecified 06/08/2014  . PFIZER SARS-COV-2 Vaccination 11/24/2019, 12/15/2019  . Pneumococcal Conjugate-13 03/04/2016  . Pneumococcal Polysaccharide-23 06/02/2014, 06/28/2019  . Tdap 06/28/2008    TDAP status: Due, Education has been provided regarding the importance of this vaccine. Advised may receive this vaccine at local pharmacy or Health Dept. Aware to  provide a copy of the vaccination record if obtained from local pharmacy or Health Dept. Verbalized acceptance and understanding.   Flu Vaccine status: Completed at today's visit   Pneumococcal vaccine status: Up to date   Covid-19 vaccine status: Completed vaccines  Qualifies for Shingles Vaccine? Yes   Zostavax completed No   Shingrix Completed?: No.    Education has been provided regarding the importance of this vaccine. Patient has been advised to call insurance company to determine out of pocket expense if they have not yet received this vaccine. Advised may also receive vaccine at local pharmacy or Health Dept. Verbalized acceptance and understanding.  Screening Tests Health Maintenance  Topic Date Due  . FOOT EXAM  06/27/2020  . HEMOGLOBIN A1C  06/28/2020  . OPHTHALMOLOGY EXAM  07/13/2020 (Originally 04/05/2020)  . TETANUS/TDAP  12/26/2020 (Originally 06/28/2018)  . COLONOSCOPY  02/13/2021  . MAMMOGRAM  06/17/2021  . INFLUENZA VACCINE  Completed  . DEXA SCAN  Completed  . COVID-19 Vaccine  Completed  . Hepatitis C Screening  Completed  . PNA vac Low Risk Adult  Completed    Health Maintenance  Health Maintenance Due  Topic Date Due  . FOOT EXAM  06/27/2020  . HEMOGLOBIN A1C  06/28/2020    Colorectal cancer screening: Completed 02/14/11. Repeat every 10 years   Mammogram status: Completed 06/18/19. Repeat every year   Bone Density status: Completed 06/18/19. Results reflect: Bone density results: OSTEOPENIA. Repeat every 2 years.  Lung Cancer Screening: (Low Dose CT Chest recommended if Age 72-80 years, 30 pack-year currently smoking OR have quit w/in 15years.) does not qualify.   Additional Screening:  Hepatitis C Screening: does qualify; Completed 04/15/13  Vision Screening: Recommended annual ophthalmology exams for early detection of glaucoma and other disorders of the eye. Is the patient up to date with their annual eye exam?  No  - scheduled for Thursday Who is  the provider or what is the name of the office in which the patient attends annual eye exams? [place]  Dental Screening: Recommended annual dental exams for proper oral hygiene  Community Resource Referral / Chronic Care Management: CRR required this visit?  No   CCM required this visit?  No      Plan:     I have personally reviewed and noted the following in the patient's chart:   . Medical and social history . Use of alcohol, tobacco or illicit drugs  . Current medications and supplements . Functional ability and status . Nutritional status . Physical activity . Advanced directives . List of other physicians . Hospitalizations, surgeries, and ER visits in previous 12 months . Vitals . Screenings to include cognitive, depression, and falls . Referrals and appointments  In addition, I have reviewed and discussed with patient certain preventive protocols, quality metrics, and best practice recommendations. A written personalized care plan for preventive services as well as general preventive health recommendations were provided to patient.     [name], LPN   [id number]   Nurse Notes: none

## 2020-07-12 LAB — COMPLETE METABOLIC PANEL WITH GFR
AG Ratio: 1.6 (calc) (ref 1.0–2.5)
ALT: 13 U/L (ref 6–29)
AST: 13 U/L (ref 10–35)
Albumin: 4 g/dL (ref 3.6–5.1)
Alkaline phosphatase (APISO): 92 U/L (ref 37–153)
BUN/Creatinine Ratio: 12 (calc) (ref 6–22)
BUN: 12 mg/dL (ref 7–25)
CO2: 29 mmol/L (ref 20–32)
Calcium: 9.4 mg/dL (ref 8.6–10.4)
Chloride: 103 mmol/L (ref 98–110)
Creat: 1.02 mg/dL — ABNORMAL HIGH (ref 0.50–0.99)
GFR, Est African American: 65 mL/min/{1.73_m2} (ref >=60)
GFR, Est Non African American: 56 mL/min/{1.73_m2} — ABNORMAL LOW (ref >=60)
Globulin: 2.5 g/dL (calc) (ref 1.9–3.7)
Glucose, Bld: 186 mg/dL — ABNORMAL HIGH (ref 65–99)
Potassium: 4.3 mmol/L (ref 3.5–5.3)
Sodium: 142 mmol/L (ref 135–146)
Total Bilirubin: 0.3 mg/dL (ref 0.2–1.2)
Total Protein: 6.5 g/dL (ref 6.1–8.1)

## 2020-07-12 LAB — MICROALBUMIN / CREATININE URINE RATIO
Creatinine, Urine: 119 mg/dL (ref 20–275)
Microalb Creat Ratio: 27 mcg/mg creat (ref <30)
Microalb, Ur: 3.2 mg/dL

## 2020-07-12 LAB — LIPID PANEL
Cholesterol: 146 mg/dL (ref <200)
HDL: 56 mg/dL (ref >=50)
LDL Cholesterol (Calc): 62 mg/dL (calc)
Non-HDL Cholesterol (Calc): 90 mg/dL (calc) (ref <130)
Total CHOL/HDL Ratio: 2.6 (calc) (ref <5.0)
Triglycerides: 211 mg/dL — ABNORMAL HIGH (ref <150)

## 2020-07-13 DIAGNOSIS — Z9841 Cataract extraction status, right eye: Secondary | ICD-10-CM | POA: Diagnosis not present

## 2020-07-13 DIAGNOSIS — H40053 Ocular hypertension, bilateral: Secondary | ICD-10-CM | POA: Diagnosis not present

## 2020-07-13 DIAGNOSIS — H52213 Irregular astigmatism, bilateral: Secondary | ICD-10-CM | POA: Diagnosis not present

## 2020-07-13 DIAGNOSIS — E113293 Type 2 diabetes mellitus with mild nonproliferative diabetic retinopathy without macular edema, bilateral: Secondary | ICD-10-CM | POA: Diagnosis not present

## 2020-07-13 DIAGNOSIS — Z9842 Cataract extraction status, left eye: Secondary | ICD-10-CM | POA: Diagnosis not present

## 2020-07-13 DIAGNOSIS — H40023 Open angle with borderline findings, high risk, bilateral: Secondary | ICD-10-CM | POA: Diagnosis not present

## 2020-07-18 ENCOUNTER — Ambulatory Visit
Admission: RE | Admit: 2020-07-18 | Discharge: 2020-07-18 | Disposition: A | Discharge status: Home / Self Care | Payer: Medicare Other | Source: Ambulatory Visit | Attending: Family Medicine | Admitting: Family Medicine

## 2020-07-18 ENCOUNTER — Other Ambulatory Visit: Payer: Self-pay

## 2020-07-18 DIAGNOSIS — Z1231 Encounter for screening mammogram for malignant neoplasm of breast: Secondary | ICD-10-CM | POA: Diagnosis not present

## 2020-07-19 ENCOUNTER — Encounter: Payer: Self-pay | Admitting: Emergency Medicine

## 2020-07-26 ENCOUNTER — Encounter: Payer: Self-pay | Admitting: Family Medicine

## 2020-07-31 DIAGNOSIS — M179 Osteoarthritis of knee, unspecified: Secondary | ICD-10-CM | POA: Diagnosis not present

## 2020-07-31 DIAGNOSIS — M5416 Radiculopathy, lumbar region: Secondary | ICD-10-CM | POA: Diagnosis not present

## 2020-07-31 DIAGNOSIS — M169 Osteoarthritis of hip, unspecified: Secondary | ICD-10-CM | POA: Diagnosis not present

## 2020-07-31 DIAGNOSIS — M5136 Other intervertebral disc degeneration, lumbar region: Secondary | ICD-10-CM | POA: Diagnosis not present

## 2020-08-10 DIAGNOSIS — G894 Chronic pain syndrome: Secondary | ICD-10-CM | POA: Diagnosis not present

## 2020-08-19 ENCOUNTER — Other Ambulatory Visit: Payer: Self-pay | Admitting: Family Medicine

## 2020-08-19 DIAGNOSIS — E1143 Type 2 diabetes mellitus with diabetic autonomic (poly)neuropathy: Secondary | ICD-10-CM

## 2020-08-19 DIAGNOSIS — IMO0002 Reserved for concepts with insufficient information to code with codable children: Secondary | ICD-10-CM

## 2020-08-19 NOTE — Telephone Encounter (Signed)
Requested medication (s) are due for refill today:yes  Requested medication (s) are on the active medication list: yes  Last refill: 07/04/20  #90   0 refills  Future visit scheduled yes  11/14/20  Notes to clinic: not delegated  Requested Prescriptions  Pending Prescriptions Disp Refills   metoCLOPramide (REGLAN) 5 MG tablet [Pharmacy Med Name: METOCLOPRAMIDE 5 MG TABLET] 90 tablet 0    Sig: TAKE 1 TABLET BY MOUTH THREE TIMES A DAY      Not Delegated - Gastroenterology: Antiemetics Failed - 08/19/2020 12:44 AM      Failed - This refill cannot be delegated      Passed - Valid encounter within last 6 months    Recent Outpatient Visits           1 month ago Diabetes mellitus with coincident hypertension [facility])   [facility] [place], Drue Stager, MD   7 months ago Uncontrolled type 2 diabetes with peripheral autonomic neuropathy [facility])   [facility] Steele Sizer, MD   1 year ago Hypertension, benign   [facility] Steele Sizer, MD   1 year ago Allergic contact dermatitis due to other agents   [place], FNP   1 year ago Moderate episode of recurrent major depressive disorder [facility])   [facility] Steele Sizer, MD       Future Appointments             In 2 months Ancil Boozer, Drue Stager, MD [facility] [place]   In 10 months  [facility], [facility]

## 2020-08-21 ENCOUNTER — Other Ambulatory Visit: Payer: Self-pay

## 2020-08-28 DIAGNOSIS — M259 Joint disorder, unspecified: Secondary | ICD-10-CM | POA: Diagnosis not present

## 2020-08-28 DIAGNOSIS — M5136 Other intervertebral disc degeneration, lumbar region: Secondary | ICD-10-CM | POA: Diagnosis not present

## 2020-08-28 DIAGNOSIS — M5416 Radiculopathy, lumbar region: Secondary | ICD-10-CM | POA: Diagnosis not present

## 2020-08-28 DIAGNOSIS — M169 Osteoarthritis of hip, unspecified: Secondary | ICD-10-CM | POA: Diagnosis not present

## 2020-09-05 ENCOUNTER — Other Ambulatory Visit: Payer: Self-pay

## 2020-09-05 DIAGNOSIS — E1143 Type 2 diabetes mellitus with diabetic autonomic (poly)neuropathy: Secondary | ICD-10-CM

## 2020-09-05 DIAGNOSIS — IMO0002 Reserved for concepts with insufficient information to code with codable children: Secondary | ICD-10-CM

## 2020-09-05 MED ORDER — METOCLOPRAMIDE HCL 5 MG PO TABS: 5.0000 mg | ORAL_TABLET | Freq: Three times a day (TID) | ORAL | 2 refills | Status: DC

## 2020-09-06 ENCOUNTER — Other Ambulatory Visit: Payer: Self-pay

## 2020-09-06 DIAGNOSIS — E1143 Type 2 diabetes mellitus with diabetic autonomic (poly)neuropathy: Secondary | ICD-10-CM

## 2020-09-06 DIAGNOSIS — IMO0002 Reserved for concepts with insufficient information to code with codable children: Secondary | ICD-10-CM

## 2020-09-09 DIAGNOSIS — G894 Chronic pain syndrome: Secondary | ICD-10-CM | POA: Diagnosis not present

## 2020-09-27 DIAGNOSIS — Z794 Long term (current) use of insulin: Secondary | ICD-10-CM | POA: Diagnosis not present

## 2020-09-27 DIAGNOSIS — M259 Joint disorder, unspecified: Secondary | ICD-10-CM | POA: Diagnosis not present

## 2020-09-27 DIAGNOSIS — M5416 Radiculopathy, lumbar region: Secondary | ICD-10-CM | POA: Diagnosis not present

## 2020-09-27 DIAGNOSIS — E1159 Type 2 diabetes mellitus with other circulatory complications: Secondary | ICD-10-CM | POA: Diagnosis not present

## 2020-09-27 DIAGNOSIS — M5136 Other intervertebral disc degeneration, lumbar region: Secondary | ICD-10-CM | POA: Diagnosis not present

## 2020-09-27 DIAGNOSIS — E1169 Type 2 diabetes mellitus with other specified complication: Secondary | ICD-10-CM | POA: Diagnosis not present

## 2020-09-27 DIAGNOSIS — M169 Osteoarthritis of hip, unspecified: Secondary | ICD-10-CM | POA: Diagnosis not present

## 2020-09-27 DIAGNOSIS — E1165 Type 2 diabetes mellitus with hyperglycemia: Secondary | ICD-10-CM | POA: Diagnosis not present

## 2020-09-27 DIAGNOSIS — I152 Hypertension secondary to endocrine disorders: Secondary | ICD-10-CM | POA: Diagnosis not present

## 2020-10-23 DIAGNOSIS — M259 Joint disorder, unspecified: Secondary | ICD-10-CM | POA: Diagnosis not present

## 2020-10-23 DIAGNOSIS — G894 Chronic pain syndrome: Secondary | ICD-10-CM | POA: Diagnosis not present

## 2020-10-23 DIAGNOSIS — M5416 Radiculopathy, lumbar region: Secondary | ICD-10-CM | POA: Diagnosis not present

## 2020-10-23 DIAGNOSIS — M5136 Other intervertebral disc degeneration, lumbar region: Secondary | ICD-10-CM | POA: Diagnosis not present

## 2020-10-23 DIAGNOSIS — M169 Osteoarthritis of hip, unspecified: Secondary | ICD-10-CM | POA: Diagnosis not present

## 2020-11-13 NOTE — Progress Notes (Unsigned)
Name: Lorraine Jones   MRN: [id number]    DOB: 03-08-1951   Date:11/14/2020       Progress Note  Subjective  Chief Complaint  Follow Up  HPI  DMII with neuropathy,dyslipidemia and gastroparesis, microalbuminuriaand mild diabetic retinopathy (diagnosed July 2020). She is now under the care of Dr. Honor Junes, reminded her to let CMA know - so we don't check her A1C in our office, however she states next visit with Dr. Honor Junes is in June last A1C was in October. She is now on Metformin and insulin R and N typesLong acting 32 units BID and pre meal is 28 units qacShe states not longer craving sweets as much She denies polydipsia or polyphagia, or polyuria , she has intermittent symptoms of neuropathy, described as aching sensation on her feet but under control with medication for pain.  She is taking statins and aspirin daily also on ARB, last urine micro negative  She states indigestion is under control with Reglan before meals. She is taking Pravastatin A1C was 8 % today   HTNin DM: she is compliant with medication and bp is at goal .No chest pain, dizziness  or palpitation.She is on lower dose of Tribenzor 40/5/25 and Carvedilol  Chronic Pain: she sees Dr. Primus Bravo at least once a month, taking pain medication as prescribed, she has constipation secondary to narcotics and is taking Miralaxotcto control symptoms, she has bowel movements ever other day, but not straining .Shestill did not have hip replacement, A1C is now at goal but she is scared of having it done. She states still using a cane when out of the house to prevent falls since when the pain is intense it radiation down both thighs and she feels weak.   Hyperlipidemia: She stopped Crestor and went back on Pravastatin because of side effects.  She denies myalgias secondary to medication. No chest pain.Last LDL was down to 62   Osteopenia: she had a bone density back in 03/2016 , her FRAX score is 3.2 % for major  osteoporotic fracture in the next 10 years and 0.4 for hip .She is taking calcium plus D,last bone density was done 06/2019 and normal spine, mild osteopenia -1.2 and FRAX score was 3.3 % all fractures and 0.4 % for hip fracture, she is taking vitamin D and trying to have a high calcium diet   Obesity: BMI above 35 with co-,morbidities, discussed importance of weight loss. She avoiding sweets, weight is stable.   Depressionis mild recurrent. :  she has a long of major depression, part of it secondary to living in pain and inability to do things she likes. She states Effexorand is thankful to be alive every day. She lost her premature grandson in October 20211  he was born prematurely and died 18 months later in the NICU .   Patient Active Problem List   Diagnosis Date Noted  . 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 2 diabetes mellitus ([place]) 04/06/2019  . Morbid obesity ([place]) 12/01/2018  . Primary osteoarthritis of both hips 02/06/2018  . Hypertension associated with diabetes ([place]) 07/23/2017  . Hyperlipidemia due to type 2 diabetes mellitus ([place]) 07/23/2017  . Major depression, recurrent ([place]) 06/25/2016  . Osteopenia 04/18/2016  . Gastroesophageal reflux disease without esophagitis 05/12/2015  . Chronic pain 05/12/2015  . Hyperlipidemia 05/12/2015  . Lumbar post-laminectomy syndrome 02/27/2015  . Neuropathy due to secondary diabetes ([place]) 02/27/2015  . Spinal stenosis, lumbar region, with neurogenic claudication 02/27/2015  .  Sacroiliac joint dysfunction of both sides 02/27/2015  . Degenerative joint disease (DJD) of hip 02/27/2015  . DDD (degenerative disc disease), cervical 02/27/2015  . Bilateral occipital neuralgia 02/27/2015  . Chronic constipation 06/02/2014  . Hyponatremia 06/01/2014  . Hypertension, benign 06/01/2014    Past Surgical History:  Procedure Laterality Date  . CHOLECYSTECTOMY  1996  . CYSTOSCOPY  WITH URETEROSCOPY Right 09/01/2013   Procedure: CYSTOSCOPY WITH UNROOFING  RIGHT URETEROCELE AND URETERAL STONE REMOVED  WITH GYRUS COLLINS KNIFE. ;  Surgeon: Irine Seal, MD;  Location: [facility];  Service: Urology;  Laterality: Right;  cysto, right uretersoscopy and stone extraction   collins knife  . LUMBAR DISC SURGERY  1993   L4  --  L5    Family History  Problem Relation Age of Onset  . Diabetes Mother   . Heart disease Mother   . Hypertension Mother   . Breast cancer Neg Hx     Social History   Tobacco Use  . Smoking status: Current Every Day Smoker    Packs/day: 0.50    Years: 20.00    Pack years: 10.00    Types: Cigarettes  . Smokeless tobacco: Never Used  . Tobacco comment: she is cutting down   Substance Use Topics  . Alcohol use: No    Alcohol/week: 0.0 standard drinks     Current Outpatient Medications:  .  aspirin EC 81 MG tablet, Take 81 mg by mouth daily., Disp: , Rfl:  .  dorzolamide-timolol (COSOPT) 22.3-6.8 MG/ML ophthalmic solution, Place 1 drop into both eyes 2 (two) times daily., Disp: , Rfl:  .  fentaNYL (DURAGESIC - DOSED MCG/HR) 75 MCG/HR, Apply 2 patches to skin every 2 days if tolerated.   NOTE: Do not apply 100 g per hour fentanyl patch since insurance will not approve 100 g patch for you, Disp: 30 patch, Rfl: 0 .  metFORMIN (GLUCOPHAGE) 1000 MG tablet, Take 1 tablet (1,000 mg total) by mouth 2 (two) times daily with a meal., Disp: 180 tablet, Rfl: 1 .  Multiple Vitamins-Minerals (MULTIVITAMIN WITH MINERALS) tablet, Take 1 tablet by mouth daily., Disp: , Rfl:  .  NOVOLIN N RELION 100 UNIT/ML injection, INJECT 28 UNITS SUBCUTANEOUSLY TWO TIMES DAILY., Disp: , Rfl:  .  NOVOLIN R RELION 100 UNIT/ML injection, INJECT 20 UNITS SUBCUTANEOUSLY THREE TIMES DAILY BEFORE MEAL(S) (ADD SLIDING SCALE AS NECESSARY), Disp: , Rfl:  .  Olmesartan-amLODIPine-HCTZ 40-5-25 MG TABS, Take 1 tablet by mouth daily., Disp: 90 tablet, Rfl: 1 .  Oxycodone  HCl 20 MG TABS, LIMIT 1/2 TO 1 TABLET BY MOUTH 3 TO 5 TIMES PER DAY IF TOLERATED *NOTE TABLET IS TWICE AS STRONG*, Disp: , Rfl:  .  polyethylene glycol (MIRALAX / GLYCOLAX) packet, Take 17 g by mouth daily., Disp: 14 each, Rfl: 0 .  pravastatin (PRAVACHOL) 40 MG tablet, Take 1 tablet (40 mg total) by mouth daily., Disp: 90 tablet, Rfl: 3 .  venlafaxine XR (EFFEXOR-XR) 150 MG 24 hr capsule, Take 1 capsule (150 mg total) by mouth daily with breakfast., Disp: 90 capsule, Rfl: 1 .  Blood Glucose Monitoring Suppl (ONETOUCH VERIO) w/Device KIT, USE TO TEST BLOOD SUGAR (Patient not taking: Reported on 11/14/2020), Disp: , Rfl:  .  carvedilol (COREG) 3.125 MG tablet, Take 1 tablet (3.125 mg total) by mouth 2 (two) times daily., Disp: 180 tablet, Rfl: 1 .  Continuous Blood Gluc Receiver (FREESTYLE LIBRE 14 DAY READER) DEVI, Use 1 each as needed .  Use to  test blood sugar (Patient not taking: Reported on 11/14/2020), Disp: , Rfl:  .  Continuous Blood Gluc Sensor (FREESTYLE LIBRE 14 DAY SENSOR) MISC, Use 1 each every 14 (fourteen) days (Patient not taking: Reported on 11/14/2020), Disp: , Rfl:  .  glucose blood test strip, 1 strip by Other route 2 (two) times daily. (Patient not taking: Reported on 11/14/2020), Disp: , Rfl:  .  metoCLOPramide (REGLAN) 5 MG tablet, Take 1 tablet (5 mg total) by mouth 3 (three) times daily., Disp: 270 tablet, Rfl: 1 .  triamcinolone cream (KENALOG) 0.1 %, Apply 1 application topically 2 (two) times daily. (Patient not taking: Reported on 11/14/2020), Disp: 453.6 g, Rfl: 0  Allergies  Allergen Reactions  . Diclofenac Sodium Swelling  . Naproxen Sodium Swelling  . Etodolac Swelling  . Gabapentin Swelling  . Naproxen Swelling    I personally reviewed active problem list, medication list, allergies, family history, social history, health maintenance with the patient/caregiver today.   ROS  Constitutional: Negative for fever or weight change.  Respiratory: Negative for cough and  shortness of breath.   Cardiovascular: Negative for chest pain or palpitations.  Gastrointestinal: Negative for abdominal pain, no bowel changes.  Musculoskeletal: positive  for gait problem but no  joint swelling.  Skin: Negative for rash.  Neurological: Negative for dizziness , positiive for intermittent  headache.  No other specific complaints in a complete review of systems (except as listed in HPI above).  Objective  Vitals:   11/14/20 0836  BP: 122/78  Pulse: 100  Resp: 16  Temp: 98.3 F (36.8 C)  TempSrc: Oral  SpO2: 95%  Weight: 190 lb 11.2 oz (86.5 kg)  Height: 5' 1"  (1.549 m)    Body mass index is 36.03 kg/m.  Physical Exam  Constitutional: Patient appears well-developed and well-nourished. Obese  No distress.  HEENT: head atraumatic, normocephalic, pupils equal and reactive to light,  neck supple Cardiovascular: Normal rate, regular rhythm and normal heart sounds.  No murmur heard. No BLE edema. Pulmonary/Chest: Effort normal and breath sounds normal. No respiratory distress. Abdominal: Soft.  There is no tenderness. Muscular Skeletal: uses a cane, walk slowly Psychiatric: Patient has a normal mood and affect. behavior is normal. Judgment and thought content normal.  Recent Results (from the past 2160 hour(s))  POCT HgB A1C     Status: Abnormal   Collection Time: 11/14/20  8:48 AM  Result Value Ref Range   Hemoglobin A1C 8.0 (A) 4.0 - 5.6 %   HbA1c POC (<> result, manual entry)     HbA1c, POC (prediabetic range)     HbA1c, POC (controlled diabetic range)       PHQ2/9: Depression screen [facility] 2/9 11/14/2020 07/11/2020 07/11/2020 12/27/2019 06/28/2019  Decreased Interest 3 0 0 0 0  Down, Depressed, Hopeless 1 0 0 1 -  PHQ - 2 Score 4 0 0 1 0  Altered sleeping 0 - 0 0 0  Tired, decreased energy 0 - 0 1 0  Change in appetite 0 - 0 0 0  Feeling bad or failure about yourself  1 - 0 0 0  Trouble concentrating 0 - 0 0 0  Moving slowly or fidgety/restless 1 - 0 1  0  Suicidal thoughts 0 - 0 0 0  PHQ-9 Score 6 - 0 3 0  Difficult doing work/chores - - Not difficult at all Not difficult at all Not difficult at all  Some recent data might be hidden    phq 9 is  positive    Fall Risk: Fall Risk  11/14/2020 07/11/2020 07/11/2020 12/27/2019 06/28/2019  Falls in the past year? 0 0 0 0 0  Comment - - - - -  Number falls in past yr: 0 0 0 0 0  Injury with Fall? 0 0 0 0 0  Risk for fall due to : - Impaired balance/gait Impaired balance/gait - -  Follow up Falls evaluation completed Falls prevention discussed - - -     Functional Status Survey: Is the patient deaf or have difficulty hearing?: No Does the patient have difficulty seeing, even when wearing glasses/contacts?: No Does the patient have difficulty concentrating, remembering, or making decisions?: No Does the patient have difficulty walking or climbing stairs?: Yes Does the patient have difficulty dressing or bathing?: No Does the patient have difficulty doing errands alone such as visiting a doctor's office or shopping?: No    Assessment & Plan  1. Diabetes mellitus with coincident hypertension ([place])  - POCT HgB A1C - carvedilol (COREG) 3.125 MG tablet; Take 1 tablet (3.125 mg total) by mouth 2 (two) times daily.  Dispense: 180 tablet; Refill: 1  2. Morbid obesity ([place])  Discussed with the patient the risk posed by an increased BMI. Discussed importance of portion control, calorie counting and at least 150 minutes of physical activity weekly. Avoid sweet beverages and drink more water. Eat at least 6 servings of fruit and vegetables daily   3. Chronic pain syndrome   4. Dyslipidemia associated with type 2 diabetes mellitus ([place])   5. Hypertension, benign  - carvedilol (COREG) 3.125 MG tablet; Take 1 tablet (3.125 mg total) by mouth 2 (two) times daily.  Dispense: 180 tablet; Refill: 1  6. Diabetes mellitus with microalbuminuria ([place])   7. Hypertension associated with diabetes  ([place])   8. Mild episode of recurrent major depressive disorder ([place])   9. Type 2 diabetes, uncontrolled, with gastroparesis ([facility])  - metoCLOPramide (REGLAN) 5 MG tablet; Take 1 tablet (5 mg total) by mouth 3 (three) times daily.  Dispense: 270 tablet; Refill: 1

## 2020-11-14 ENCOUNTER — Encounter: Payer: Self-pay | Admitting: Family Medicine

## 2020-11-14 ENCOUNTER — Ambulatory Visit (INDEPENDENT_AMBULATORY_CARE_PROVIDER_SITE_OTHER): Payer: Medicare Other | Admitting: Family Medicine

## 2020-11-14 ENCOUNTER — Other Ambulatory Visit: Payer: Self-pay

## 2020-11-14 VITALS — BP 122/78 | HR 100 | Temp 98.3°F | Resp 16 | Ht 61.0 in | Wt 190.7 lb

## 2020-11-14 DIAGNOSIS — E1129 Type 2 diabetes mellitus with other diabetic kidney complication: Secondary | ICD-10-CM

## 2020-11-14 DIAGNOSIS — G894 Chronic pain syndrome: Secondary | ICD-10-CM | POA: Diagnosis not present

## 2020-11-14 DIAGNOSIS — E1143 Type 2 diabetes mellitus with diabetic autonomic (poly)neuropathy: Secondary | ICD-10-CM

## 2020-11-14 DIAGNOSIS — E1169 Type 2 diabetes mellitus with other specified complication: Secondary | ICD-10-CM | POA: Diagnosis not present

## 2020-11-14 DIAGNOSIS — E1159 Type 2 diabetes mellitus with other circulatory complications: Secondary | ICD-10-CM | POA: Diagnosis not present

## 2020-11-14 DIAGNOSIS — I152 Hypertension secondary to endocrine disorders: Secondary | ICD-10-CM | POA: Diagnosis not present

## 2020-11-14 DIAGNOSIS — E785 Hyperlipidemia, unspecified: Secondary | ICD-10-CM | POA: Diagnosis not present

## 2020-11-14 DIAGNOSIS — R809 Proteinuria, unspecified: Secondary | ICD-10-CM | POA: Diagnosis not present

## 2020-11-14 DIAGNOSIS — F330 Major depressive disorder, recurrent, mild: Secondary | ICD-10-CM

## 2020-11-14 DIAGNOSIS — IMO0002 Reserved for concepts with insufficient information to code with codable children: Secondary | ICD-10-CM

## 2020-11-14 DIAGNOSIS — E1165 Type 2 diabetes mellitus with hyperglycemia: Secondary | ICD-10-CM

## 2020-11-14 DIAGNOSIS — E119 Type 2 diabetes mellitus without complications: Secondary | ICD-10-CM

## 2020-11-14 DIAGNOSIS — I10 Essential (primary) hypertension: Secondary | ICD-10-CM | POA: Diagnosis not present

## 2020-11-14 LAB — POCT GLYCOSYLATED HEMOGLOBIN (HGB A1C): Hemoglobin A1C: 8 % — AB (ref 4.0–5.6)

## 2020-11-14 MED ORDER — METOCLOPRAMIDE HCL 5 MG PO TABS: 5.0000 mg | ORAL_TABLET | Freq: Three times a day (TID) | ORAL | 1 refills | Status: DC

## 2020-11-14 MED ORDER — CARVEDILOL 3.125 MG PO TABS: 3.1250 mg | ORAL_TABLET | Freq: Two times a day (BID) | ORAL | 1 refills | Status: DC

## 2020-11-27 DIAGNOSIS — G894 Chronic pain syndrome: Secondary | ICD-10-CM | POA: Diagnosis not present

## 2020-11-27 DIAGNOSIS — M5136 Other intervertebral disc degeneration, lumbar region: Secondary | ICD-10-CM | POA: Diagnosis not present

## 2020-11-27 DIAGNOSIS — M179 Osteoarthritis of knee, unspecified: Secondary | ICD-10-CM | POA: Diagnosis not present

## 2020-11-27 DIAGNOSIS — M169 Osteoarthritis of hip, unspecified: Secondary | ICD-10-CM | POA: Diagnosis not present

## 2020-11-29 ENCOUNTER — Other Ambulatory Visit: Payer: Self-pay | Admitting: Family Medicine

## 2020-11-29 DIAGNOSIS — E1129 Type 2 diabetes mellitus with other diabetic kidney complication: Secondary | ICD-10-CM

## 2020-11-29 DIAGNOSIS — R809 Proteinuria, unspecified: Secondary | ICD-10-CM

## 2020-11-29 DIAGNOSIS — I10 Essential (primary) hypertension: Secondary | ICD-10-CM

## 2020-12-25 DIAGNOSIS — M5136 Other intervertebral disc degeneration, lumbar region: Secondary | ICD-10-CM | POA: Diagnosis not present

## 2020-12-25 DIAGNOSIS — G894 Chronic pain syndrome: Secondary | ICD-10-CM | POA: Diagnosis not present

## 2020-12-25 DIAGNOSIS — M179 Osteoarthritis of knee, unspecified: Secondary | ICD-10-CM | POA: Diagnosis not present

## 2020-12-25 DIAGNOSIS — M169 Osteoarthritis of hip, unspecified: Secondary | ICD-10-CM | POA: Diagnosis not present

## 2020-12-26 ENCOUNTER — Other Ambulatory Visit: Payer: Self-pay | Admitting: Family Medicine

## 2021-01-09 DIAGNOSIS — H40023 Open angle with borderline findings, high risk, bilateral: Secondary | ICD-10-CM | POA: Diagnosis not present

## 2021-01-09 DIAGNOSIS — H40053 Ocular hypertension, bilateral: Secondary | ICD-10-CM | POA: Diagnosis not present

## 2021-01-22 DIAGNOSIS — R609 Edema, unspecified: Secondary | ICD-10-CM | POA: Diagnosis not present

## 2021-01-22 DIAGNOSIS — M4726 Other spondylosis with radiculopathy, lumbar region: Secondary | ICD-10-CM | POA: Diagnosis not present

## 2021-01-22 DIAGNOSIS — I119 Hypertensive heart disease without heart failure: Secondary | ICD-10-CM | POA: Diagnosis not present

## 2021-01-22 DIAGNOSIS — M25559 Pain in unspecified hip: Secondary | ICD-10-CM | POA: Diagnosis not present

## 2021-01-22 DIAGNOSIS — M792 Neuralgia and neuritis, unspecified: Secondary | ICD-10-CM | POA: Diagnosis not present

## 2021-01-22 DIAGNOSIS — M5136 Other intervertebral disc degeneration, lumbar region: Secondary | ICD-10-CM | POA: Diagnosis not present

## 2021-01-22 DIAGNOSIS — M79609 Pain in unspecified limb: Secondary | ICD-10-CM | POA: Diagnosis not present

## 2021-01-22 DIAGNOSIS — M9931 Osseous stenosis of neural canal of cervical region: Secondary | ICD-10-CM | POA: Diagnosis not present

## 2021-01-22 DIAGNOSIS — M47897 Other spondylosis, lumbosacral region: Secondary | ICD-10-CM | POA: Diagnosis not present

## 2021-01-22 DIAGNOSIS — M48062 Spinal stenosis, lumbar region with neurogenic claudication: Secondary | ICD-10-CM | POA: Diagnosis not present

## 2021-01-22 DIAGNOSIS — M9951 Intervertebral disc stenosis of neural canal of cervical region: Secondary | ICD-10-CM | POA: Diagnosis not present

## 2021-01-22 DIAGNOSIS — G8929 Other chronic pain: Secondary | ICD-10-CM | POA: Diagnosis not present

## 2021-01-22 DIAGNOSIS — M179 Osteoarthritis of knee, unspecified: Secondary | ICD-10-CM | POA: Diagnosis not present

## 2021-01-22 DIAGNOSIS — R5382 Chronic fatigue, unspecified: Secondary | ICD-10-CM | POA: Diagnosis not present

## 2021-01-22 DIAGNOSIS — M169 Osteoarthritis of hip, unspecified: Secondary | ICD-10-CM | POA: Diagnosis not present

## 2021-01-22 DIAGNOSIS — M25519 Pain in unspecified shoulder: Secondary | ICD-10-CM | POA: Diagnosis not present

## 2021-01-22 DIAGNOSIS — M4807 Spinal stenosis, lumbosacral region: Secondary | ICD-10-CM | POA: Diagnosis not present

## 2021-01-22 DIAGNOSIS — G894 Chronic pain syndrome: Secondary | ICD-10-CM | POA: Diagnosis not present

## 2021-02-14 ENCOUNTER — Other Ambulatory Visit: Payer: Self-pay | Admitting: Family Medicine

## 2021-02-14 DIAGNOSIS — F325 Major depressive disorder, single episode, in full remission: Secondary | ICD-10-CM

## 2021-02-14 NOTE — Telephone Encounter (Signed)
Pt informed script has been called in

## 2021-02-14 NOTE — Telephone Encounter (Signed)
  Notes to clinic:  this script is expired  Review for continued use and refill    Requested Prescriptions  Pending Prescriptions Disp Refills   venlafaxine XR (EFFEXOR-XR) 150 MG 24 hr capsule [Pharmacy Med Name: VENLAFAXINE HCL ER 150 MG CAP] 90 capsule 1    Sig: Take 1 capsule (150 mg total) by mouth daily with breakfast.      Psychiatry: Antidepressants - SNRI - desvenlafaxine & venlafaxine Failed - 02/14/2021 10:12 AM      Failed - Triglycerides in normal range and within 360 days    Triglycerides  Date Value Ref Range Status  07/11/2020 211 (H) <150 mg/dL Final    Comment:    . If a non-fasting specimen was collected, consider repeat triglyceride testing on a fasting specimen if clinically indicated.  Yates Decamp et al. J. of Clin. Lipidol. 5366;4:403-474. Marland Kitchen           Passed - LDL in normal range and within 360 days    LDL Cholesterol (Calc)  Date Value Ref Range Status  07/11/2020 62 mg/dL (calc) Final    Comment:    Reference range: <100 . Desirable range <100 mg/dL for primary prevention;   <70 mg/dL for patients with CHD or diabetic patients  with > or = 2 CHD risk factors. Marland Kitchen LDL-C is now calculated using the Martin-Hopkins  calculation, which is a validated novel method providing  better accuracy than the Friedewald equation in the  estimation of LDL-C.  Cresenciano Genre et al. Annamaria Helling. 2595;638(75): 2061-2068  (http://education.[facility].com/faq/FAQ164)           Passed - Total Cholesterol in normal range and within 360 days    Cholesterol, Total  Date Value Ref Range Status  05/12/2015 215 (H) 100 - 199 mg/dL Final   Cholesterol  Date Value Ref Range Status  07/11/2020 146 <200 mg/dL Final          Passed - Completed PHQ-2 or PHQ-9 in the last 360 days      Passed - Last BP in normal range    BP Readings from Last 1 Encounters:  11/14/20 122/78          Passed - Valid encounter within last 6 months    Recent Outpatient Visits           3  months ago Diabetes mellitus with coincident hypertension [facility])   [facility] Paige, Drue Stager, MD   7 months ago Diabetes mellitus with coincident hypertension [facility])   [facility] [place], Drue Stager, MD   1 year ago Uncontrolled type 2 diabetes with peripheral autonomic neuropathy [facility])   [facility] Steele Sizer, MD   1 year ago Hypertension, benign   [facility] Steele Sizer, MD   1 year ago Allergic contact dermatitis due to other agents   [place], [place]       Future Appointments             In 1 month Steele Sizer, MD [facility], [place]   In 4 months  [facility], [facility]

## 2021-02-14 NOTE — Telephone Encounter (Signed)
Pt has an appt on 03/26/21

## 2021-02-26 DIAGNOSIS — M179 Osteoarthritis of knee, unspecified: Secondary | ICD-10-CM | POA: Diagnosis not present

## 2021-02-26 DIAGNOSIS — M5136 Other intervertebral disc degeneration, lumbar region: Secondary | ICD-10-CM | POA: Diagnosis not present

## 2021-02-26 DIAGNOSIS — G894 Chronic pain syndrome: Secondary | ICD-10-CM | POA: Diagnosis not present

## 2021-02-26 DIAGNOSIS — M169 Osteoarthritis of hip, unspecified: Secondary | ICD-10-CM | POA: Diagnosis not present

## 2021-02-28 DIAGNOSIS — E1169 Type 2 diabetes mellitus with other specified complication: Secondary | ICD-10-CM | POA: Diagnosis not present

## 2021-02-28 DIAGNOSIS — E1165 Type 2 diabetes mellitus with hyperglycemia: Secondary | ICD-10-CM | POA: Diagnosis not present

## 2021-02-28 DIAGNOSIS — Z794 Long term (current) use of insulin: Secondary | ICD-10-CM | POA: Diagnosis not present

## 2021-02-28 DIAGNOSIS — E785 Hyperlipidemia, unspecified: Secondary | ICD-10-CM | POA: Diagnosis not present

## 2021-02-28 LAB — HEMOGLOBIN A1C: Hemoglobin A1C: 7.8

## 2021-03-19 DIAGNOSIS — M5136 Other intervertebral disc degeneration, lumbar region: Secondary | ICD-10-CM | POA: Diagnosis not present

## 2021-03-19 DIAGNOSIS — M179 Osteoarthritis of knee, unspecified: Secondary | ICD-10-CM | POA: Diagnosis not present

## 2021-03-19 DIAGNOSIS — M169 Osteoarthritis of hip, unspecified: Secondary | ICD-10-CM | POA: Diagnosis not present

## 2021-03-19 DIAGNOSIS — G894 Chronic pain syndrome: Secondary | ICD-10-CM | POA: Diagnosis not present

## 2021-03-23 NOTE — Progress Notes (Signed)
Name: Lorraine Jones   MRN: [id number]    DOB: Jun 30, 1951   Date:03/26/2021       Progress Note  Subjective  Chief Complaint  Follow Up  HPI  DMII with neuropathy,dyslipidemia and gastroparesis, microalbuminuria and mild diabetic retinopathy (diagnosed July 2020). She is now under the care of Dr. Honor Junes, last A1C was down to 7.8 %  She is now on  Metformin and insulin R and N types Long acting 32 units BID and pre meal is 28 units qac She denies polydipsia or polyphagia, or polyuria , she states neuropathy symptoms under control with medication, occasionally feels very full after a meal, he is taking statins and aspirin daily also on ARB, last urine micro negative. She did not bring her meter but states lowest level at home was 106, highest at home was 260, most of the time it has been elevated fasting over 160    HTN in DM:  she is compliant with medication .No chest pain, dizziness  or palpitation. She is on lower dose of Tribenzor 40/5/25 and Carvedilol, BP today was borderline but usually well controlled, we will continue current regiment  - she did not take medications this am.    Chronic Pain: she sees Dr. Primus Bravo at least once a month, taking pain medication as prescribed, she has constipation secondary to narcotics and is taking Miralax otc to control symptoms, she has bowel movements ever other day, but not straining. She states Dr. Primus Bravo wants to repeat MRI, explained he can order it or send me a note requesting me to order it. She was advised to have right hip replacement and A1C is at goal, she needs to contact ortho again    Hyperlipidemia: She stopped Crestor and went back on Pravastatin because of side effects.  She denies myalgias secondary to medication. No chest pain. Last LDL was down to 62 , unchanged    Osteopenia: she had a bone density back in 03/2016 , her FRAX score is 3.2 % for major osteoporotic fracture in the next 10 years and 0.4 for hip . She is taking calcium plus D,  last bone density was done 06/2019 and normal spine, mild osteopenia -1.2 and FRAX score was 3.3 % all fractures and 0.4 % for hip fracture, she is taking vitamin D and trying to have a high calcium diet . Unchanged    Obesity: BMI above 35 with co-,morbidities, discussed importance of weight loss. She avoiding sweets, weight is stable. 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 :  she has a long of major depression, part of it secondary to living in pain and inability to do things she likes. She states Effexor and is thankful to be alive every day. She lost her premature grandson in October 20211  he was born prematurely and died 47 months later in the NICU . She states she is doing better now   Patient Active Problem List   Diagnosis Date Noted   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 2 diabetes mellitus ([place]) 04/06/2019   Morbid obesity ([place]) 12/01/2018   Primary osteoarthritis of both hips 02/06/2018   Hypertension associated with diabetes ([place]) 07/23/2017   Hyperlipidemia due to type 2 diabetes mellitus ([place]) 07/23/2017   Major depression, recurrent ([place]) 06/25/2016   Osteopenia 04/18/2016   Gastroesophageal reflux disease without esophagitis 05/12/2015   Chronic pain  05/12/2015   Hyperlipidemia 05/12/2015   Lumbar post-laminectomy syndrome 02/27/2015   Neuropathy due to secondary diabetes ([place]) 02/27/2015   Spinal stenosis, lumbar region, with neurogenic claudication 02/27/2015   Sacroiliac joint dysfunction of both sides 02/27/2015   Degenerative joint disease (DJD) of hip 02/27/2015   DDD (degenerative disc disease), cervical 02/27/2015   Bilateral occipital neuralgia 02/27/2015   Chronic constipation 06/02/2014   Hyponatremia 06/01/2014   Hypertension, benign 06/01/2014    Past Surgical History:  Procedure Laterality Date   CHOLECYSTECTOMY  1996    CYSTOSCOPY WITH URETEROSCOPY Right 09/01/2013   Procedure: CYSTOSCOPY WITH UNROOFING  RIGHT URETEROCELE AND URETERAL STONE REMOVED  WITH GYRUS COLLINS KNIFE. ;  Surgeon: Irine Seal, MD;  Location: [facility];  Service: Urology;  Laterality: Right;  cysto, right uretersoscopy and stone extraction   collins knife   LUMBAR DISC SURGERY  1993   L4  --  L5    Family History  Problem Relation Age of Onset   Diabetes Mother    Heart disease Mother    Hypertension Mother    Breast cancer Neg Hx     Social History   Tobacco Use   Smoking status: Every Day    Packs/day: 0.50    Years: 20.00    Pack years: 10.00    Types: Cigarettes   Smokeless tobacco: Never   Tobacco comments:    she is cutting down   Substance Use Topics   Alcohol use: No    Alcohol/week: 0.0 standard drinks     Current Outpatient Medications:    aspirin EC 81 MG tablet, Take 81 mg by mouth daily., Disp: , Rfl:    Blood Glucose Monitoring Suppl (ONETOUCH VERIO) w/Device KIT, , Disp: , Rfl:    dorzolamide-timolol (COSOPT) 22.3-6.8 MG/ML ophthalmic solution, Place 1 drop into both eyes 2 (two) times daily., Disp: , Rfl:    fentaNYL (DURAGESIC - DOSED MCG/HR) 75 MCG/HR, Apply 2 patches to skin every 2 days if tolerated.   NOTE: Do not apply 100 g per hour fentanyl patch since insurance will not approve 100 g patch for you, Disp: 30 patch, Rfl: 0   glucose blood test strip, 1 strip by Other route 2 (two) times daily., Disp: , Rfl:    metFORMIN (GLUCOPHAGE) 1000 MG tablet, Take 1 tablet (1,000 mg total) by mouth 2 (two) times daily with a meal., Disp: 180 tablet, Rfl: 1   metoCLOPramide (REGLAN) 5 MG tablet, Take 1 tablet (5 mg total) by mouth 3 (three) times daily., Disp: 270 tablet, Rfl: 1   Multiple Vitamins-Minerals (MULTIVITAMIN WITH MINERALS) tablet, Take 1 tablet by mouth daily., Disp: , Rfl:    NOVOLIN N RELION 100 UNIT/ML injection, INJECT 28 UNITS SUBCUTANEOUSLY TWO TIMES DAILY., Disp: , Rfl:     NOVOLIN R RELION 100 UNIT/ML injection, INJECT 20 UNITS SUBCUTANEOUSLY THREE TIMES DAILY BEFORE MEAL(S) (ADD SLIDING SCALE AS NECESSARY), Disp: , Rfl:    Oxycodone HCl 20 MG TABS, LIMIT 1/2 TO 1 TABLET BY MOUTH 3 TO 5 TIMES PER DAY IF TOLERATED *NOTE TABLET IS TWICE AS STRONG*, Disp: , Rfl:    polyethylene glycol (MIRALAX / GLYCOLAX) packet, Take 17 g by mouth daily., Disp: 14 each, Rfl: 0   pravastatin (PRAVACHOL) 40 MG tablet, TAKE 1 TABLET BY MOUTH EVERY DAY, Disp: 90 tablet, Rfl: 1   Zoster Vaccine Adjuvanted (SHINGRIX) injection, Inject 0.5 mLs into the muscle once for 1 dose., Disp: 0.5 mL, Rfl: 1   carvedilol (  COREG) 3.125 MG tablet, Take 1 tablet (3.125 mg total) by mouth 2 (two) times daily., Disp: 180 tablet, Rfl: 1   Olmesartan-amLODIPine-HCTZ 40-5-25 MG TABS, Take 1 tablet by mouth daily., Disp: 90 tablet, Rfl: 1   venlafaxine XR (EFFEXOR-XR) 150 MG 24 hr capsule, Take 1 capsule (150 mg total) by mouth daily with breakfast., Disp: 90 capsule, Rfl: 1  Allergies  Allergen Reactions   Diclofenac Sodium Swelling   Naproxen Sodium Swelling   Etodolac Swelling   Gabapentin Swelling   Naproxen Swelling    I personally reviewed active problem list, medication list, allergies, family history, social history, health maintenance with the patient/caregiver today.   ROS  Constitutional: Negative for fever or weight change.  Respiratory: Negative for cough and shortness of breath.   Cardiovascular: Negative for chest pain or palpitations.  Gastrointestinal: Negative for abdominal pain, no bowel changes.  Musculoskeletal: positive for gait problem or joint swelling.  Skin: Negative for rash.  Neurological: Negative for dizziness or headache.  No other specific complaints in a complete review of systems (except as listed in HPI above).   Objective  Vitals:   03/26/21 0920  BP: 140/74  Pulse: 85  Resp: 16  Temp: 98.5 F (36.9 C)  TempSrc: Oral  SpO2: 99%  Weight: 192 lb  (87.1 kg)  Height: 5' 1"  (1.549 m)    Body mass index is 36.28 kg/m.  Physical Exam  Constitutional: Patient appears well-developed and well-nourished. Obese  No distress.  HEENT: head atraumatic, normocephalic, pupils equal and reactive to light, neck supple Cardiovascular: Normal rate, regular rhythm and normal heart sounds.  No murmur heard. No BLE edema. Pulmonary/Chest: Effort normal and breath sounds normal. No respiratory distress. Abdominal: Soft.  There is no tenderness. Psychiatric: Patient has a normal mood and affect. behavior is normal. Judgment and thought content normal.  Muscular skeletal: walks slowly with assistance of a cane  Recent Results (from the past 2160 hour(s))  Hemoglobin A1c     Status: None   Collection Time: 02/28/21 12:00 AM  Result Value Ref Range   Hemoglobin A1C 7.8       PHQ2/9: Depression screen [facility] 2/9 03/26/2021 11/14/2020 07/11/2020 07/11/2020 12/27/2019  Decreased Interest 0 3 0 0 0  Down, Depressed, Hopeless 2 1 0 0 1  PHQ - 2 Score 2 4 0 0 1  Altered sleeping 0 0 - 0 0  Tired, decreased energy 0 0 - 0 1  Change in appetite 0 0 - 0 0  Feeling bad or failure about yourself  0 1 - 0 0  Trouble concentrating 0 0 - 0 0  Moving slowly or fidgety/restless 0 1 - 0 1  Suicidal thoughts 0 0 - 0 0  PHQ-9 Score 2 6 - 0 3  Difficult doing work/chores - - - Not difficult at all Not difficult at all  Some recent data might be hidden    phq 9 is positive   Fall Risk: Fall Risk  03/26/2021 11/14/2020 07/11/2020 07/11/2020 12/27/2019  Falls in the past year? 0 0 0 0 0  Comment - - - - -  Number falls in past yr: 0 0 0 0 0  Injury with Fall? 0 0 0 0 0  Risk for fall due to : - - Impaired balance/gait Impaired balance/gait -  Follow up - Falls evaluation completed Falls prevention discussed - -     Functional Status Survey: Is the patient deaf or have difficulty hearing?: No Does the  patient have difficulty seeing, even when wearing  glasses/contacts?: No Does the patient have difficulty concentrating, remembering, or making decisions?: No Does the patient have difficulty walking or climbing stairs?: Yes Does the patient have difficulty dressing or bathing?: No Does the patient have difficulty doing errands alone such as visiting a doctor's office or shopping?: No   Assessment & Plan  1. Diabetes mellitus with coincident hypertension (HCC)  - carvedilol (COREG) 3.125 MG tablet; Take 1 tablet (3.125 mg total) by mouth 2 (two) times daily.  Dispense: 180 tablet; Refill: 1 - Olmesartan-amLODIPine-HCTZ 40-5-25 MG TABS; Take 1 tablet by mouth daily.  Dispense: 90 tablet; Refill: 1  2. Morbid obesity ([place])  Discussed with the patient the risk posed by an increased BMI. Discussed importance of portion control, calorie counting and at least 150 minutes of physical activity weekly. Avoid sweet beverages and drink more water. Eat at least 6 servings of fruit and vegetables daily    3. Chronic pain syndrome  Keep follow up with Dr. Primus Bravo  4. Hypertension, benign  - carvedilol (COREG) 3.125 MG tablet; Take 1 tablet (3.125 mg total) by mouth 2 (two) times daily.  Dispense: 180 tablet; Refill: 1 - Olmesartan-amLODIPine-HCTZ 40-5-25 MG TABS; Take 1 tablet by mouth daily.  Dispense: 90 tablet; Refill: 1  5. Dyslipidemia associated with type 2 diabetes mellitus ([place])   6. Hypertension associated with diabetes ([place])  Continue medication   7. Mild episode of recurrent major depressive disorder (HCC)  - venlafaxine XR (EFFEXOR-XR) 150 MG 24 hr capsule; Take 1 capsule (150 mg total) by mouth daily with breakfast.  Dispense: 90 capsule; Refill: 1  8. Colon cancer screening  - Cologuard  9. Need for shingles vaccine  - Zoster Vaccine Adjuvanted [facility]) injection; Inject 0.5 mLs into the muscle once for 1 dose.  Dispense: 0.5 mL; Refill: 1  10. Diabetes mellitus with microalbuminuria (HCC)  - Olmesartan-amLODIPine-HCTZ  40-5-25 MG TABS; Take 1 tablet by mouth daily.  Dispense: 90 tablet; Refill: 1

## 2021-03-26 ENCOUNTER — Encounter: Payer: Self-pay | Admitting: Family Medicine

## 2021-03-26 ENCOUNTER — Ambulatory Visit (INDEPENDENT_AMBULATORY_CARE_PROVIDER_SITE_OTHER): Payer: Medicare Other | Admitting: Family Medicine

## 2021-03-26 ENCOUNTER — Other Ambulatory Visit: Payer: Self-pay

## 2021-03-26 VITALS — BP 140/74 | HR 85 | Temp 98.5°F | Resp 16 | Ht 61.0 in | Wt 192.0 lb

## 2021-03-26 DIAGNOSIS — I152 Hypertension secondary to endocrine disorders: Secondary | ICD-10-CM

## 2021-03-26 DIAGNOSIS — E1129 Type 2 diabetes mellitus with other diabetic kidney complication: Secondary | ICD-10-CM | POA: Diagnosis not present

## 2021-03-26 DIAGNOSIS — F330 Major depressive disorder, recurrent, mild: Secondary | ICD-10-CM

## 2021-03-26 DIAGNOSIS — E1169 Type 2 diabetes mellitus with other specified complication: Secondary | ICD-10-CM

## 2021-03-26 DIAGNOSIS — R809 Proteinuria, unspecified: Secondary | ICD-10-CM

## 2021-03-26 DIAGNOSIS — Z23 Encounter for immunization: Secondary | ICD-10-CM

## 2021-03-26 DIAGNOSIS — G894 Chronic pain syndrome: Secondary | ICD-10-CM

## 2021-03-26 DIAGNOSIS — E1159 Type 2 diabetes mellitus with other circulatory complications: Secondary | ICD-10-CM

## 2021-03-26 DIAGNOSIS — I10 Essential (primary) hypertension: Secondary | ICD-10-CM

## 2021-03-26 DIAGNOSIS — E119 Type 2 diabetes mellitus without complications: Secondary | ICD-10-CM

## 2021-03-26 DIAGNOSIS — E785 Hyperlipidemia, unspecified: Secondary | ICD-10-CM | POA: Diagnosis not present

## 2021-03-26 DIAGNOSIS — Z1211 Encounter for screening for malignant neoplasm of colon: Secondary | ICD-10-CM

## 2021-03-26 LAB — HM DIABETES EYE EXAM

## 2021-03-26 MED ORDER — SHINGRIX 50 MCG/0.5ML IM SUSR: 0.5000 mL | Freq: Once | INTRAMUSCULAR | 1 refills | Status: AC

## 2021-03-26 MED ORDER — CARVEDILOL 3.125 MG PO TABS: 3.1250 mg | ORAL_TABLET | Freq: Two times a day (BID) | ORAL | 1 refills | Status: DC

## 2021-03-26 MED ORDER — VENLAFAXINE HCL ER 150 MG PO CP24: 150.0000 mg | ORAL_CAPSULE | Freq: Every day | ORAL | 1 refills | Status: DC

## 2021-03-26 MED ORDER — OLMESARTAN-AMLODIPINE-HCTZ 40-5-25 MG PO TABS: 1.0000 | ORAL_TABLET | Freq: Every day | ORAL | 1 refills | Status: DC

## 2021-04-11 DIAGNOSIS — Z1211 Encounter for screening for malignant neoplasm of colon: Secondary | ICD-10-CM | POA: Diagnosis not present

## 2021-04-16 DIAGNOSIS — M5136 Other intervertebral disc degeneration, lumbar region: Secondary | ICD-10-CM | POA: Diagnosis not present

## 2021-04-16 DIAGNOSIS — M179 Osteoarthritis of knee, unspecified: Secondary | ICD-10-CM | POA: Diagnosis not present

## 2021-04-16 DIAGNOSIS — M169 Osteoarthritis of hip, unspecified: Secondary | ICD-10-CM | POA: Diagnosis not present

## 2021-04-16 DIAGNOSIS — G894 Chronic pain syndrome: Secondary | ICD-10-CM | POA: Diagnosis not present

## 2021-04-16 LAB — COLOGUARD
COLOGUARD: NEGATIVE
Cologuard: NEGATIVE

## 2021-04-16 LAB — EXTERNAL GENERIC LAB PROCEDURE: COLOGUARD: NEGATIVE

## 2021-05-02 ENCOUNTER — Telehealth: Payer: Self-pay

## 2021-05-02 NOTE — Telephone Encounter (Signed)
Copied from [place] [id number]. Topic: General - Call Back - No Documentation >> May 02, 2021  2:52 PM Yvette Rack wrote: Reason for CRM: Pt stated she was returning call  to the office from a missed call she had. Pt requests call back

## 2021-05-02 NOTE — Telephone Encounter (Signed)
Called patient to see if they left a message for I do not see anything in her chart where we called her.

## 2021-05-22 DIAGNOSIS — M9943 Connective tissue stenosis of neural canal of lumbar region: Secondary | ICD-10-CM | POA: Diagnosis not present

## 2021-05-22 DIAGNOSIS — M5136 Other intervertebral disc degeneration, lumbar region: Secondary | ICD-10-CM | POA: Diagnosis not present

## 2021-05-22 DIAGNOSIS — G894 Chronic pain syndrome: Secondary | ICD-10-CM | POA: Diagnosis not present

## 2021-05-22 DIAGNOSIS — M25519 Pain in unspecified shoulder: Secondary | ICD-10-CM | POA: Diagnosis not present

## 2021-05-22 DIAGNOSIS — R5382 Chronic fatigue, unspecified: Secondary | ICD-10-CM | POA: Diagnosis not present

## 2021-05-22 DIAGNOSIS — M9931 Osseous stenosis of neural canal of cervical region: Secondary | ICD-10-CM | POA: Diagnosis not present

## 2021-05-22 DIAGNOSIS — G8929 Other chronic pain: Secondary | ICD-10-CM | POA: Diagnosis not present

## 2021-05-22 DIAGNOSIS — M792 Neuralgia and neuritis, unspecified: Secondary | ICD-10-CM | POA: Diagnosis not present

## 2021-05-22 DIAGNOSIS — M79609 Pain in unspecified limb: Secondary | ICD-10-CM | POA: Diagnosis not present

## 2021-05-22 DIAGNOSIS — M9973 Connective tissue and disc stenosis of intervertebral foramina of lumbar region: Secondary | ICD-10-CM | POA: Diagnosis not present

## 2021-05-22 DIAGNOSIS — M9951 Intervertebral disc stenosis of neural canal of cervical region: Secondary | ICD-10-CM | POA: Diagnosis not present

## 2021-05-22 DIAGNOSIS — R609 Edema, unspecified: Secondary | ICD-10-CM | POA: Diagnosis not present

## 2021-05-22 DIAGNOSIS — M25559 Pain in unspecified hip: Secondary | ICD-10-CM | POA: Diagnosis not present

## 2021-05-22 DIAGNOSIS — M179 Osteoarthritis of knee, unspecified: Secondary | ICD-10-CM | POA: Diagnosis not present

## 2021-05-22 DIAGNOSIS — M169 Osteoarthritis of hip, unspecified: Secondary | ICD-10-CM | POA: Diagnosis not present

## 2021-06-12 ENCOUNTER — Other Ambulatory Visit: Payer: Self-pay | Admitting: Family Medicine

## 2021-06-12 DIAGNOSIS — Z1231 Encounter for screening mammogram for malignant neoplasm of breast: Secondary | ICD-10-CM

## 2021-06-19 DIAGNOSIS — G894 Chronic pain syndrome: Secondary | ICD-10-CM | POA: Diagnosis not present

## 2021-06-19 DIAGNOSIS — M179 Osteoarthritis of knee, unspecified: Secondary | ICD-10-CM | POA: Diagnosis not present

## 2021-06-19 DIAGNOSIS — M5136 Other intervertebral disc degeneration, lumbar region: Secondary | ICD-10-CM | POA: Diagnosis not present

## 2021-06-19 DIAGNOSIS — M169 Osteoarthritis of hip, unspecified: Secondary | ICD-10-CM | POA: Diagnosis not present

## 2021-07-12 ENCOUNTER — Ambulatory Visit: Payer: Medicare Other | Admitting: Family Medicine

## 2021-07-12 ENCOUNTER — Ambulatory Visit: Payer: Medicare Other

## 2021-07-13 ENCOUNTER — Other Ambulatory Visit: Payer: Self-pay | Admitting: Family Medicine

## 2021-07-16 ENCOUNTER — Ambulatory Visit: Payer: Medicare Other | Admitting: Family Medicine

## 2021-07-16 NOTE — Progress Notes (Signed)
Name: Lorraine Jones   MRN: [id number]    DOB: October 08, 1950   Date:07/17/2021       Progress Note  Subjective  Chief Complaint  Follow Up  HPI  DMII with neuropathy,dyslipidemia and gastroparesis, microalbuminuria and mild diabetic retinopathy (diagnosed July 2020). She is now under the care of Dr. Honor Junes, last A1C was done in 02/2021 and it was down to 7.8 %  She is now on  Metformin and insulin R and N types Long acting 32 units BID and pre meal is 28 units qac She denies polydipsia or polyphagia, or polyuria , she states neuropathy symptoms under control with medication, occasionally feels very full after a mea but controlled with metoclopramide She is compliant with statins and aspirin daily also on ARB, last urine micro negative.    HTN in DM:  she is compliant with medication .No chest pain, dizziness  or palpitation. She is on lower dose of Tribenzor 40/5/25 and Carvedilol, BP today is under control today  Chronic Pain: she sees Dr. Primus Bravo at least once a month, taking pain medication as prescribed, she has constipation secondary to narcotics and is taking Miralax otc to control symptoms, she has bowel movements ever other day, but not straining or blood in stools.  She has chronic back pain, and pain is 4-5/10 usually on lower back, described as nagging toothache that radiates sometimes to right lower leg. She was advised to have right hip replacement and A1C is at goal, now that the A1C is at goal she is not sure if she wants to have surgery - explained her age is perfect for a hip surgery waiting may increase his risk of complications. She should contact Ortho and discuss surgery again   Hyperlipidemia: She stopped Crestor and went back on Pravastatin because of side effects.  She denies myalgias secondary to medication. No chest pain. Last LDL was down to 62 , continue medication   Osteopenia: she had a bone density back in 03/2016 , her FRAX score is 3.2 % for major osteoporotic fracture  in the next 10 years and 0.4 for hip . She is taking calcium plus D, last bone density was done 06/2019 and normal spine, mild osteopenia -1.2 and FRAX score was 3.3 % all fractures and 0.4 % for hip fracture, she is taking vitamin D and trying to have a high calcium diet .Stable  Obesity: BMI is now below 35, she lost 10 lbs since last visit, she is not sure how, but states not eating as many sweets lately, she states she is feeling well, explained she needs to maintain her weight now.   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 :  she has a long of major depression, part of it secondary to living in pain and inability to do things she likes. She states Effexor and is thankful to be alive every day. She lost her premature grandson in October 20211  he was born prematurely [birth year]2020 and died 4 months later in the NICU, she states she is happy that her daughter was able to conceive again and grandson was born 04/2021   Patient Active Problem List   Diagnosis Date Noted   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 2 diabetes mellitus ([place]) 04/06/2019   Morbid obesity ([place]) 12/01/2018   Primary osteoarthritis of both hips 02/06/2018   Hypertension associated  with diabetes ([place]) 07/23/2017   Hyperlipidemia due to type 2 diabetes mellitus ([place]) 07/23/2017   Major depression, recurrent ([place]) 06/25/2016   Osteopenia 04/18/2016   Gastroesophageal reflux disease without esophagitis 05/12/2015   Chronic pain 05/12/2015   Hyperlipidemia 05/12/2015   Lumbar post-laminectomy syndrome 02/27/2015   Neuropathy due to secondary diabetes ([place]) 02/27/2015   Spinal stenosis, lumbar region, with neurogenic claudication 02/27/2015   Sacroiliac joint dysfunction of both sides 02/27/2015   Degenerative joint disease (DJD) of hip 02/27/2015   DDD (degenerative disc disease), cervical 02/27/2015    Bilateral occipital neuralgia 02/27/2015   Chronic constipation 06/02/2014   Hyponatremia 06/01/2014   Hypertension, benign 06/01/2014    Past Surgical History:  Procedure Laterality Date   CHOLECYSTECTOMY  1996   CYSTOSCOPY WITH URETEROSCOPY Right 09/01/2013   Procedure: CYSTOSCOPY WITH UNROOFING  RIGHT URETEROCELE AND URETERAL STONE REMOVED  WITH GYRUS COLLINS KNIFE. ;  Surgeon: Irine Seal, MD;  Location: [facility];  Service: Urology;  Laterality: Right;  cysto, right uretersoscopy and stone extraction   collins knife   LUMBAR DISC SURGERY  1993   L4  --  L5    Family History  Problem Relation Age of Onset   Diabetes Mother    Heart disease Mother    Hypertension Mother    Breast cancer Neg Hx     Social History   Tobacco Use   Smoking status: Every Day    Packs/day: 0.50    Years: 20.00    Pack years: 10.00    Types: Cigarettes   Smokeless tobacco: Never   Tobacco comments:    she is cutting down   Substance Use Topics   Alcohol use: No    Alcohol/week: 0.0 standard drinks     Current Outpatient Medications:    aspirin EC 81 MG tablet, Take 81 mg by mouth daily., Disp: , Rfl:    Blood Glucose Monitoring Suppl (ONETOUCH VERIO) w/Device KIT, , Disp: , Rfl:    carvedilol (COREG) 3.125 MG tablet, Take 1 tablet (3.125 mg total) by mouth 2 (two) times daily., Disp: 180 tablet, Rfl: 1   dorzolamide-timolol (COSOPT) 22.3-6.8 MG/ML ophthalmic solution, Place 1 drop into both eyes 2 (two) times daily., Disp: , Rfl:    fentaNYL (DURAGESIC - DOSED MCG/HR) 75 MCG/HR, Apply 2 patches to skin every 2 days if tolerated.   NOTE: Do not apply 100 g per hour fentanyl patch since insurance will not approve 100 g patch for you, Disp: 30 patch, Rfl: 0   glucose blood test strip, 1 strip by Other route 2 (two) times daily., Disp: , Rfl:    metFORMIN (GLUCOPHAGE) 1000 MG tablet, Take 1 tablet (1,000 mg total) by mouth 2 (two) times daily with a meal., Disp: 180 tablet,  Rfl: 1   metoCLOPramide (REGLAN) 5 MG tablet, Take 1 tablet (5 mg total) by mouth 3 (three) times daily., Disp: 270 tablet, Rfl: 1   Multiple Vitamins-Minerals (MULTIVITAMIN WITH MINERALS) tablet, Take 1 tablet by mouth daily., Disp: , Rfl:    NOVOLIN N RELION 100 UNIT/ML injection, INJECT 28 UNITS SUBCUTANEOUSLY TWO TIMES DAILY., Disp: , Rfl:    NOVOLIN R RELION 100 UNIT/ML injection, INJECT 20 UNITS SUBCUTANEOUSLY THREE TIMES DAILY BEFORE MEAL(S) (ADD SLIDING SCALE AS NECESSARY), Disp: , Rfl:    Olmesartan-amLODIPine-HCTZ 40-5-25 MG TABS, Take 1 tablet by mouth daily., Disp: 90 tablet, Rfl: 1   Oxycodone HCl 20 MG TABS, LIMIT 1/2 TO 1 TABLET BY MOUTH 3 TO  5 TIMES PER DAY IF TOLERATED *NOTE TABLET IS TWICE AS STRONG*, Disp: , Rfl:    polyethylene glycol (MIRALAX / GLYCOLAX) packet, Take 17 g by mouth daily., Disp: 14 each, Rfl: 0   pravastatin (PRAVACHOL) 40 MG tablet, TAKE 1 TABLET BY MOUTH EVERY DAY, Disp: 90 tablet, Rfl: 0   venlafaxine XR (EFFEXOR-XR) 150 MG 24 hr capsule, Take 1 capsule (150 mg total) by mouth daily with breakfast., Disp: 90 capsule, Rfl: 1  Allergies  Allergen Reactions   Diclofenac Sodium Swelling   Naproxen Sodium Swelling   Etodolac Swelling   Gabapentin Swelling   Naproxen Swelling    I personally reviewed active problem list, medication list, allergies, family history, social history, health maintenance with the patient/caregiver today.   ROS  Constitutional: Negative for fever , positive for  weight change -loss.  Respiratory: Negative for cough and shortness of breath.   Cardiovascular: Negative for chest pain or palpitations.  Gastrointestinal: Negative for abdominal pain, no bowel changes.  Musculoskeletal: positive  for gait problem she has intermittent ankle joint swelling.  Skin: Negative for rash.  Neurological: Negative for dizziness or headache.  No other specific complaints in a complete review of systems (except as listed in HPI above).    Objective  Vitals:   07/17/21 1006  BP: 128/64  Pulse: 89  Resp: 16  Temp: 98.1 F (36.7 C)  SpO2: 99%  Weight: 182 lb (82.6 kg)  Height: _0  (1.549 m)    Body mass index is 34.39 kg/m.  Physical Exam  Constitutional: Patient appears well-developed and well-nourished. Obese  No distress.  HEENT: head atraumatic, normocephalic, pupils equal and reactive to light,neck supple, throat within normal limits Cardiovascular: Normal rate, regular rhythm and normal heart sounds.  No murmur heard. No BLE edema. Pulmonary/Chest: Effort normal and breath sounds normal. No respiratory distress. Abdominal: Soft.  There is no tenderness. Psychiatric: Patient has a normal mood and affect. behavior is normal. Judgment and thought content normal.   Diabetic Foot Exam: Diabetic Foot Exam - Simple   Simple Foot Form Diabetic Foot exam was performed with the following findings: Yes 07/17/2021 10:44 AM  Visual Inspection See comments: Yes Sensation Testing Intact to touch and monofilament testing bilaterally: Yes Pulse Check Posterior Tibialis and Dorsalis pulse intact bilaterally: Yes Comments Dry skin and thick toenails.       PHQ2/9: Depression screen [facility] 2/9 07/17/2021 03/26/2021 11/14/2020 07/11/2020 07/11/2020  Decreased Interest 2 0 3 0 0  Down, Depressed, Hopeless _1 0 0  PHQ - 2 Score _2 0 0  Altered sleeping 0 0 0 - 0  Tired, decreased energy 0 0 0 - 0  Change in appetite 0 0 0 - 0  Feeling bad or failure about yourself  1 0 1 - 0  Trouble concentrating 0 0 0 - 0  Moving slowly or fidgety/restless 0 0 1 - 0  Suicidal thoughts 0 0 0 - 0  PHQ-9 Score _3 - 0  Difficult doing work/chores - - - - Not difficult at all  Some recent data might be hidden    phq 9 is positive   Fall Risk: Fall Risk  07/17/2021 03/26/2021 11/14/2020 07/11/2020 07/11/2020  Falls in the past year? 0 0 0 0 0  Comment - - - - -  Number falls in past yr: 0 0 0 0 0  Injury with Fall? 0  0 0 0 0  Risk for fall due to :  Impaired balance/gait - - Impaired balance/gait Impaired balance/gait  Follow up Falls prevention discussed - Falls evaluation completed Falls prevention discussed -      Functional Status Survey: Is the patient deaf or have difficulty hearing?: No Does the patient have difficulty seeing, even when wearing glasses/contacts?: No Does the patient have difficulty concentrating, remembering, or making decisions?: No Does the patient have difficulty walking or climbing stairs?: Yes Does the patient have difficulty dressing or bathing?: No Does the patient have difficulty doing errands alone such as visiting a doctor's office or shopping?: No    Assessment & Plan  1. Diabetes mellitus with coincident hypertension ([place])  Doing well   2. Hypertension, benign  At goal   3. Need for immunization against influenza  - Flu vaccine HIGH DOSE PF (Fluzone High dose)  4. Dyslipidemia associated with type 2 diabetes mellitus ([place])   5. Chronic pain syndrome  Under the care of pain clinic   6. Diabetes mellitus with microalbuminuria (HCC)   7. Obesity (BMI 30.0-34.9)  Doing well, lost weight, but explained I am worried since she is not sure why she lost weight, we will monitor   8. Mild episode of recurrent major depressive disorder (HCC)  Stable

## 2021-07-17 ENCOUNTER — Ambulatory Visit: Payer: Medicare Other

## 2021-07-17 ENCOUNTER — Other Ambulatory Visit: Payer: Self-pay

## 2021-07-17 ENCOUNTER — Encounter: Payer: Self-pay | Admitting: Family Medicine

## 2021-07-17 ENCOUNTER — Ambulatory Visit (INDEPENDENT_AMBULATORY_CARE_PROVIDER_SITE_OTHER): Payer: Medicare Other | Admitting: Family Medicine

## 2021-07-17 VITALS — BP 128/64 | HR 89 | Temp 98.1°F | Resp 16 | Ht 61.0 in | Wt 182.0 lb

## 2021-07-17 DIAGNOSIS — E119 Type 2 diabetes mellitus without complications: Secondary | ICD-10-CM

## 2021-07-17 DIAGNOSIS — I10 Essential (primary) hypertension: Secondary | ICD-10-CM

## 2021-07-17 DIAGNOSIS — G894 Chronic pain syndrome: Secondary | ICD-10-CM

## 2021-07-17 DIAGNOSIS — E785 Hyperlipidemia, unspecified: Secondary | ICD-10-CM

## 2021-07-17 DIAGNOSIS — E1169 Type 2 diabetes mellitus with other specified complication: Secondary | ICD-10-CM | POA: Diagnosis not present

## 2021-07-17 DIAGNOSIS — R809 Proteinuria, unspecified: Secondary | ICD-10-CM | POA: Diagnosis not present

## 2021-07-17 DIAGNOSIS — E1129 Type 2 diabetes mellitus with other diabetic kidney complication: Secondary | ICD-10-CM | POA: Diagnosis not present

## 2021-07-17 DIAGNOSIS — Z23 Encounter for immunization: Secondary | ICD-10-CM

## 2021-07-17 DIAGNOSIS — F330 Major depressive disorder, recurrent, mild: Secondary | ICD-10-CM

## 2021-07-17 DIAGNOSIS — E669 Obesity, unspecified: Secondary | ICD-10-CM

## 2021-07-24 DIAGNOSIS — M169 Osteoarthritis of hip, unspecified: Secondary | ICD-10-CM | POA: Diagnosis not present

## 2021-07-24 DIAGNOSIS — M259 Joint disorder, unspecified: Secondary | ICD-10-CM | POA: Diagnosis not present

## 2021-07-24 DIAGNOSIS — G894 Chronic pain syndrome: Secondary | ICD-10-CM | POA: Diagnosis not present

## 2021-07-24 DIAGNOSIS — M5136 Other intervertebral disc degeneration, lumbar region: Secondary | ICD-10-CM | POA: Diagnosis not present

## 2021-07-25 ENCOUNTER — Ambulatory Visit
Admission: RE | Admit: 2021-07-25 | Discharge: 2021-07-25 | Disposition: A | Discharge status: Home / Self Care | Payer: Medicare Other | Source: Ambulatory Visit | Attending: Family Medicine | Admitting: Family Medicine

## 2021-07-25 ENCOUNTER — Other Ambulatory Visit: Payer: Self-pay

## 2021-07-25 DIAGNOSIS — Z1231 Encounter for screening mammogram for malignant neoplasm of breast: Secondary | ICD-10-CM | POA: Insufficient documentation

## 2021-08-02 DIAGNOSIS — H40023 Open angle with borderline findings, high risk, bilateral: Secondary | ICD-10-CM | POA: Diagnosis not present

## 2021-08-02 DIAGNOSIS — E113293 Type 2 diabetes mellitus with mild nonproliferative diabetic retinopathy without macular edema, bilateral: Secondary | ICD-10-CM | POA: Diagnosis not present

## 2021-08-02 DIAGNOSIS — H52213 Irregular astigmatism, bilateral: Secondary | ICD-10-CM | POA: Diagnosis not present

## 2021-08-02 DIAGNOSIS — H40053 Ocular hypertension, bilateral: Secondary | ICD-10-CM | POA: Diagnosis not present

## 2021-08-02 DIAGNOSIS — Z9841 Cataract extraction status, right eye: Secondary | ICD-10-CM | POA: Diagnosis not present

## 2021-08-02 DIAGNOSIS — Z9842 Cataract extraction status, left eye: Secondary | ICD-10-CM | POA: Diagnosis not present

## 2021-08-02 LAB — HM DIABETES EYE EXAM

## 2021-08-21 ENCOUNTER — Ambulatory Visit: Payer: Medicare Other

## 2021-08-21 DIAGNOSIS — G894 Chronic pain syndrome: Secondary | ICD-10-CM | POA: Diagnosis not present

## 2021-08-21 DIAGNOSIS — M5136 Other intervertebral disc degeneration, lumbar region: Secondary | ICD-10-CM | POA: Diagnosis not present

## 2021-08-21 DIAGNOSIS — M169 Osteoarthritis of hip, unspecified: Secondary | ICD-10-CM | POA: Diagnosis not present

## 2021-08-21 DIAGNOSIS — M179 Osteoarthritis of knee, unspecified: Secondary | ICD-10-CM | POA: Diagnosis not present

## 2021-08-28 ENCOUNTER — Ambulatory Visit (INDEPENDENT_AMBULATORY_CARE_PROVIDER_SITE_OTHER): Payer: Medicare Other

## 2021-08-28 DIAGNOSIS — Z0000 Encounter for general adult medical examination without abnormal findings: Secondary | ICD-10-CM | POA: Diagnosis not present

## 2021-08-28 NOTE — Patient Instructions (Signed)
Ms. Babich , Thank you for taking time to come for your Medicare Wellness Visit. I appreciate your ongoing commitment to your health goals. Please review the following plan we discussed and let me know if I can assist you in the future.   Screening recommendations/referrals: Colonoscopy: Cologuard done 04/11/21. Repeat 03/2024 Mammogram: done 07/25/21 Bone Density: done 06/18/19. Please call [phone] to schedule your bone density screening.  Recommended yearly ophthalmology/optometry visit for glaucoma screening and checkup Recommended yearly dental visit for hygiene and checkup  Vaccinations: Influenza vaccine: done 07/17/21 Pneumococcal vaccine: done 06/28/19 Tdap vaccine: due Shingles vaccine: Shingrix discussed. Please contact your pharmacy for coverage information.  Covid-19: done 11/24/19, 12/15/19 & 08/17/20  Advanced directives: Advance directive discussed with you today. I have provided a copy for you to complete at home and have notarized. Once this is complete please bring a copy in to our office so we can scan it into your chart.   Conditions/risks identified: If you wish to quit smoking, help is available. For free tobacco cessation program offerings call the [facility] at 506-406-4012 or Live Well Line at 636-361-9824. You may also visit [email or web address] or email [email or web address] for more information on other programs.   Next appointment: Follow up in one year for your annual wellness visit    Preventive Care 65 Years and Older, Female Preventive care refers to lifestyle choices and visits with your health care provider that can promote health and wellness. What does preventive care include? A yearly physical exam. This is also called an annual well check. Dental exams once or twice a year. Routine eye exams. Ask your health care provider how often you should have your eyes checked. Personal lifestyle choices, including: Daily care of your  teeth and gums. Regular physical activity. Eating a healthy diet. Avoiding tobacco and drug use. Limiting alcohol use. Practicing safe sex. Taking low-dose aspirin every day. Taking vitamin and mineral supplements as recommended by your health care provider. What happens during an annual well check? The services and screenings done by your health care provider during your annual well check will depend on your age, overall health, lifestyle risk factors, and family history of disease. Counseling  Your health care provider may ask you questions about your: Alcohol use. Tobacco use. Drug use. Emotional well-being. Home and relationship well-being. Sexual activity. Eating habits. History of falls. Memory and ability to understand (cognition). Work and work Statistician. Reproductive health. Screening  You may have the following tests or measurements: Height, weight, and BMI. Blood pressure. Lipid and cholesterol levels. These may be checked every 5 years, or more frequently if you are over 29 years old. Skin check. Lung cancer screening. You may have this screening every year starting at age 67 if you have a 30-pack-year history of smoking and currently smoke or have quit within the past 15 years. Fecal occult blood test (FOBT) of the stool. You may have this test every year starting at age 25. Flexible sigmoidoscopy or colonoscopy. You may have a sigmoidoscopy every 5 years or a colonoscopy every 10 years starting at age 23. Hepatitis C blood test. Hepatitis B blood test. Sexually transmitted disease (STD) testing. Diabetes screening. This is done by checking your blood sugar (glucose) after you have not eaten for a while (fasting). You may have this done every 1-3 years. Bone density scan. This is done to screen for osteoporosis. You may have this done starting at age 52. Mammogram. This may be done every 1-2  years. Talk to your health care provider about how often you should have  regular mammograms. Talk with your health care provider about your test results, treatment options, and if necessary, the need for more tests. Vaccines  Your health care provider may recommend certain vaccines, such as: Influenza vaccine. This is recommended every year. Tetanus, diphtheria, and acellular pertussis (Tdap, Td) vaccine. You may need a Td booster every 10 years. Zoster vaccine. You may need this after age 49. Pneumococcal 13-valent conjugate (PCV13) vaccine. One dose is recommended after age 40. Pneumococcal polysaccharide (PPSV23) vaccine. One dose is recommended after age 26. Talk to your health care provider about which screenings and vaccines you need and how often you need them. This information is not intended to replace advice given to you by your health care provider. Make sure you discuss any questions you have with your health care provider. Document Released: 10/13/2015 Document Revised: 06/05/2016 Document Reviewed: 07/18/2015 Elsevier Interactive Patient Education  2017 [place] Prevention in the Home Falls can cause injuries. They can happen to people of all ages. There are many things you can do to make your home safe and to help prevent falls. What can I do on the outside of my home? Regularly fix the edges of walkways and driveways and fix any cracks. Remove anything that might make you trip as you walk through a door, such as a raised step or threshold. Trim any bushes or trees on the path to your home. Use bright outdoor lighting. Clear any walking paths of anything that might make someone trip, such as rocks or tools. Regularly check to see if handrails are loose or broken. Make sure that both sides of any steps have handrails. Any raised decks and porches should have guardrails on the edges. Have any leaves, snow, or ice cleared regularly. Use sand or salt on walking paths during winter. Clean up any spills in your garage right away. This  includes oil or grease spills. What can I do in the bathroom? Use night lights. Install grab bars by the toilet and in the tub and shower. Do not use towel bars as grab bars. Use non-skid mats or decals in the tub or shower. If you need to sit down in the shower, use a plastic, non-slip stool. Keep the floor dry. Clean up any water that spills on the floor as soon as it happens. Remove soap buildup in the tub or shower regularly. Attach bath mats securely with double-sided non-slip rug tape. Do not have throw rugs and other things on the floor that can make you trip. What can I do in the bedroom? Use night lights. Make sure that you have a light by your bed that is easy to reach. Do not use any sheets or blankets that are too big for your bed. They should not hang down onto the floor. Have a firm chair that has side arms. You can use this for support while you get dressed. Do not have throw rugs and other things on the floor that can make you trip. What can I do in the kitchen? Clean up any spills right away. Avoid walking on wet floors. Keep items that you use a lot in easy-to-reach places. If you need to reach something above you, use a strong step stool that has a grab bar. Keep electrical cords out of the way. Do not use floor polish or wax that makes floors slippery. If you must use wax, use non-skid floor  wax. Do not have throw rugs and other things on the floor that can make you trip. What can I do with my stairs? Do not leave any items on the stairs. Make sure that there are handrails on both sides of the stairs and use them. Fix handrails that are broken or loose. Make sure that handrails are as long as the stairways. Check any carpeting to make sure that it is firmly attached to the stairs. Fix any carpet that is loose or worn. Avoid having throw rugs at the top or bottom of the stairs. If you do have throw rugs, attach them to the floor with carpet tape. Make sure that you  have a light switch at the top of the stairs and the bottom of the stairs. If you do not have them, ask someone to add them for you. What else can I do to help prevent falls? Wear shoes that: Do not have high heels. Have rubber bottoms. Are comfortable and fit you well. Are closed at the toe. Do not wear sandals. If you use a stepladder: Make sure that it is fully opened. Do not climb a closed stepladder. Make sure that both sides of the stepladder are locked into place. Ask someone to hold it for you, if possible. Clearly mark and make sure that you can see: Any grab bars or handrails. First and last steps. Where the edge of each step is. Use tools that help you move around (mobility aids) if they are needed. These include: Canes. Walkers. Scooters. Crutches. Turn on the lights when you go into a dark area. Replace any light bulbs as soon as they burn out. Set up your furniture so you have a clear path. Avoid moving your furniture around. If any of your floors are uneven, fix them. If there are any pets around you, be aware of where they are. Review your medicines with your doctor. Some medicines can make you feel dizzy. This can increase your chance of falling. Ask your doctor what other things that you can do to help prevent falls. This information is not intended to replace advice given to you by your health care provider. Make sure you discuss any questions you have with your health care provider. Document Released: 07/13/2009 Document Revised: 02/22/2016 Document Reviewed: 10/21/2014 Elsevier Interactive Patient Education  2017 [employer].

## 2021-08-28 NOTE — Progress Notes (Signed)
Subjective:   Lorraine Jones is a 70 y.o. female who presents for Medicare Annual (Subsequent) preventive examination.  Virtual Visit via Telephone Note  I connected with  Lorraine Jones on 08/28/21 at  8:00 AM EST by telephone and verified that I am speaking with the correct person using two identifiers.  Location: Patient: home Provider: [place] Persons participating in the virtual visit: [place]   I discussed the limitations, risks, security and privacy concerns of performing an evaluation and management service by telephone and the availability of in person appointments. The patient expressed understanding and agreed to proceed.  Interactive audio and video telecommunications were attempted between this nurse and patient, however failed, due to patient having technical difficulties OR patient did not have access to video capability.  We continued and completed visit with audio only.  Some vital signs may be absent or patient reported.   Lorraine Marker, LPN   Review of Systems     Cardiac Risk Factors include: advanced age (>83mn, >>72women);diabetes mellitus;dyslipidemia;hypertension;smoking/ tobacco exposure;sedentary lifestyle;obesity (BMI >30kg/m2)     Objective:    There were no vitals filed for this visit. There is no height or weight on file to calculate BMI.  Advanced Directives 08/28/2021 07/11/2020 12/01/2018 05/16/2017 04/04/2017 12/23/2016 06/25/2016  Does Patient Have a Medical Advance Directive? _0  No No  Would patient like information on creating a medical advance directive? Yes (MAU/Ambulatory/Procedural Areas - Information given) Yes (MAU/Ambulatory/Procedural Areas - Information given) Yes (MAU/Ambulatory/Procedural Areas - Information given) - - - -  Pre-existing out of facility DNR order (yellow form or pink MOST form) - - - - - - -    Current Medications (verified) Outpatient Encounter Medications as of 08/28/2021  Medication Sig    aspirin EC 81 MG tablet Take 81 mg by mouth daily.   Blood Glucose Monitoring Suppl (ONETOUCH VERIO) w/Device KIT    carvedilol (COREG) 3.125 MG tablet Take 1 tablet (3.125 mg total) by mouth 2 (two) times daily.   dorzolamide-timolol (COSOPT) 22.3-6.8 MG/ML ophthalmic solution Place 1 drop into both eyes 2 (two) times daily.   fentaNYL (DURAGESIC - DOSED MCG/HR) 75 MCG/HR Apply 2 patches to skin every 2 days if tolerated.   NOTE: Do not apply 100 g per hour fentanyl patch since insurance will not approve 100 g patch for you   glucose blood test strip 1 strip by Other route 2 (two) times daily.   metFORMIN (GLUCOPHAGE) 1000 MG tablet Take 1 tablet (1,000 mg total) by mouth 2 (two) times daily with a meal.   metoCLOPramide (REGLAN) 5 MG tablet Take 1 tablet (5 mg total) by mouth 3 (three) times daily.   Multiple Vitamins-Minerals (MULTIVITAMIN WITH MINERALS) tablet Take 1 tablet by mouth daily.   NOVOLIN N RELION 100 UNIT/ML injection INJECT 28 UNITS SUBCUTANEOUSLY TWO TIMES DAILY.   NOVOLIN R RELION 100 UNIT/ML injection INJECT 20 UNITS SUBCUTANEOUSLY THREE TIMES DAILY BEFORE MEAL(S) (ADD SLIDING SCALE AS NECESSARY)   Olmesartan-amLODIPine-HCTZ 40-5-25 MG TABS Take 1 tablet by mouth daily.   Oxycodone HCl 20 MG TABS LIMIT 1/2 TO 1 TABLET BY MOUTH 3 TO 5 TIMES PER DAY IF TOLERATED *NOTE TABLET IS TWICE AS STRONG*   polyethylene glycol (MIRALAX / GLYCOLAX) packet Take 17 g by mouth daily.   pravastatin (PRAVACHOL) 40 MG tablet TAKE 1 TABLET BY MOUTH EVERY DAY   venlafaxine XR (EFFEXOR-XR) 150 MG 24 hr capsule Take 1 capsule (150 mg total) by mouth  daily with breakfast.   No facility-administered encounter medications on file as of 08/28/2021.    Allergies (verified) Diclofenac sodium, Naproxen sodium, Etodolac, Gabapentin, and Naproxen   History: Past Medical History:  Diagnosis Date   Arthritis    Chronic low back pain    Depression    Diabetes mellitus    Hyperlipidemia     Hypertension    Past Surgical History:  Procedure Laterality Date   CHOLECYSTECTOMY  1996   CYSTOSCOPY WITH URETEROSCOPY Right 09/01/2013   Procedure: CYSTOSCOPY WITH UNROOFING  RIGHT URETEROCELE AND URETERAL STONE REMOVED  WITH GYRUS COLLINS KNIFE. ;  Surgeon: Irine Seal, MD;  Location: [facility];  Service: Urology;  Laterality: Right;  cysto, right uretersoscopy and stone extraction   collins knife   LUMBAR DISC SURGERY  1993   L4  --  L5   Family History  Problem Relation Age of Onset   Diabetes Mother    Heart disease Mother    Hypertension Mother    Breast cancer Neg Hx    Social History   Socioeconomic History   Marital status: Married    Spouse name: Not on file   Number of children: 3   Years of education: Not on file   Highest education level: 12th grade  Occupational History   Occupation: retired  Tobacco Use   Smoking status: Every Day    Packs/day: 0.50    Years: 20.00    Pack years: 10.00    Types: Cigarettes   Smokeless tobacco: Never   Tobacco comments:    she is cutting down   Vaping Use   Vaping Use: Never used  Substance and Sexual Activity   Alcohol use: No    Alcohol/week: 0.0 standard drinks   Drug use: No   Sexual activity: Yes    Partners: Male  Other Topics Concern   Not on file  Social History Narrative   Not on file   Social Determinants of Health   Financial Resource Strain: Low Risk    Difficulty of Paying Living Expenses: Not very hard  Food Insecurity: No Food Insecurity   Worried About Running Out of Food in the Last Year: Never true   Ran Out of Food in the Last Year: Never true  Transportation Needs: No Transportation Needs   Lack of Transportation (Medical): No   Lack of Transportation (Non-Medical): No  Physical Activity: Inactive   Days of Exercise per Week: 0 days   Minutes of Exercise per Session: 0 min  Stress: No Stress Concern Present   Feeling of Stress : Not at all  Social Connections:  Moderately Integrated   Frequency of Communication with Friends and Family: More than three times a week   Frequency of Social Gatherings with Friends and Family: Three times a week   Attends Religious Services: More than 4 times per year   Active Member of Clubs or Organizations: No   Attends Archivist Meetings: Never   Marital Status: Married    Tobacco Counseling Ready to quit: Yes Counseling given: Yes Tobacco comments: she is cutting down    Clinical Intake:  Pre-visit preparation completed: Yes  Pain : No/denies pain     Nutritional Risks: None Diabetes: Yes CBG done?: No Did pt. bring in CBG monitor from home?: No  How often do you need to have someone help you when you read instructions, pamphlets, or other written materials from your doctor or pharmacy?: 1 - Never  Nutrition  Risk Assessment:  Has the patient had any N/V/D within the last 2 months?  No  Does the patient have any non-healing wounds?  No  Has the patient had any unintentional weight loss or weight gain?  No   Diabetes:  Is the patient diabetic?  Yes  If diabetic, was a CBG obtained today?  No  Did the patient bring in their glucometer from home?  No  How often do you monitor your CBG's? daily.   Financial Strains and Diabetes Management:  Are you having any financial strains with the device, your supplies or your medication? No .  Does the patient want to be seen by Chronic Care Management for management of their diabetes?  No  Would the patient like to be referred to a Nutritionist or for Diabetic Management?  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   Diabetic Foot Exam: Completed 07/17/21.   Interpreter Needed?: No  Information entered by :: Lorraine Marker LPN   Activities of Daily Living In your present state of health, do you have any difficulty performing the following activities: 08/28/2021 07/17/2021  Hearing? N N  Vision? N N   Difficulty concentrating or making decisions? N N  Walking or climbing stairs? Y Y  Dressing or bathing? N N  Doing errands, shopping? N N  Preparing Food and eating ? N -  Using the Toilet? N -  In the past six months, have you accidently leaked urine? N -  Do you have problems with loss of bowel control? N -  Managing your Medications? N -  Managing your Finances? N -  Housekeeping or managing your Housekeeping? N -  Some recent data might be hidden    Patient Care Team: Steele Sizer, MD as PCP - General Lonia Farber, MD as Consulting Physician (Internal Medicine) Mohammed Kindle, MD as Referring Physician (Pain Medicine) Vladimir Crofts, MD as Consulting Physician (Neurology)  Indicate any recent Medical Services you may have received from other than [facility] providers in the past year (date may be approximate).     Assessment:   This is a routine wellness examination for [facility].  Hearing/Vision screen Hearing Screening - Comments:: Pt has no difficulty hearing  Vision Screening - Comments:: [facility] vision screen[facility]  Dietary issues and exercise activities discussed: Current Exercise Habits: The patient does not participate in regular exercise at present, Exercise limited by: orthopedic condition(s)   Goals Addressed             This Visit's Progress    Quit Smoking       If you wish to quit smoking, help is available. For free tobacco cessation program offerings call the [facility] at 8622300664 or Live Well Line at [phone]. You may also visit [email or web address] or email [email or web address] for more information on other programs.         Depression Screen PHQ 2/9 Scores 08/28/2021 07/17/2021 03/26/2021 11/14/2020 07/11/2020 07/11/2020 12/27/2019  PHQ - 2 Score _1 0 0 1  PHQ- 9 Score _2 - 0 3  Exception Documentation - - - - - - -    Fall Risk Fall Risk  08/28/2021 07/17/2021 03/26/2021  11/14/2020 07/11/2020  Falls in the past year? 1 0 0 0 0  Comment - - - - -  Number falls in past yr: 0 0 0 0 0  Injury with Fall? 0  0 0 0 0  Risk for fall due to : No Fall Risks Impaired balance/gait - - Impaired balance/gait  Follow up Falls prevention discussed Falls prevention discussed - Falls evaluation completed Falls prevention discussed    FALL RISK PREVENTION PERTAINING TO THE HOME:  Any stairs in or around the home? Yes If so, are there any without handrails? No  Home free of loose throw rugs in walkways, pet beds, electrical cords, etc? Yes  Adequate lighting in your home to reduce risk of falls? Yes   ASSISTIVE DEVICES UTILIZED TO PREVENT FALLS:  Life alert? No  Use of a cane, walker or w/c? Yes  Grab bars in the bathroom? Yes  Shower chair or bench in shower? No  Elevated toilet seat or a handicapped toilet? Yes   TIMED UP AND GO:  Was the test performed? No . Telephonic visit.   Cognitive Function: Normal cognitive status assessed by direct observation by this Nurse Health Advisor. No abnormalities found.       6CIT Screen 07/11/2020 12/01/2018  What Year? 0 points 0 points  What month? 0 points 0 points  What time? 0 points 0 points  Count back from 20 0 points 0 points  Months in reverse 0 points 0 points  Repeat phrase 6 points 0 points  Total Score 6 0    Immunizations Immunization History  Administered Date(s) Administered   Fluad Quad(high Dose 65+) 06/28/2019, 07/11/2020   Influenza, High Dose Seasonal PF 06/25/2016, 06/30/2017, 12/01/2018, 07/17/2021   Influenza,inj,Quad PF,6+ Mos 06/19/2015   Influenza-Unspecified 06/08/2014   PFIZER(Purple Top)SARS-COV-2 Vaccination 11/24/2019, 12/15/2019, 08/17/2020   Pneumococcal Conjugate-13 03/04/2016   Pneumococcal Polysaccharide-23 06/02/2014, 06/28/2019   Tdap 06/28/2008    TDAP status: Due, Education has been provided regarding the importance of this vaccine. Advised may receive this vaccine at  local pharmacy or Health Dept. Aware to provide a copy of the vaccination record if obtained from local pharmacy or Health Dept. Verbalized acceptance and understanding.  Flu Vaccine status: Up to date  Pneumococcal vaccine status: Up to date  Covid-19 vaccine status: Completed vaccines  Qualifies for Shingles Vaccine? Yes   Zostavax completed No   Shingrix Completed?: No.    Education has been provided regarding the importance of this vaccine. Patient has been advised to call insurance company to determine out of pocket expense if they have not yet received this vaccine. Advised may also receive vaccine at local pharmacy or Health Dept. Verbalized acceptance and understanding.  Screening Tests Health Maintenance  Topic Date Due   Zoster Vaccines- Shingrix (1 of 2) Never done   TETANUS/TDAP  06/28/2018   COVID-19 Vaccine (4 - Booster for Pfizer series) 10/12/2020   HEMOGLOBIN A1C  08/30/2021   FOOT EXAM  07/17/2022   OPHTHALMOLOGY EXAM  08/02/2022   MAMMOGRAM  07/26/2023   Fecal DNA (Cologuard)  04/11/2024   Pneumonia Vaccine 2+ Years old  Completed   INFLUENZA VACCINE  Completed   DEXA SCAN  Completed   Hepatitis C Screening  Completed   HPV VACCINES  Aged Out   COLONOSCOPY (Pts 45-70yr Insurance coverage will need to be confirmed)  [place]  Topic Date Due   Zoster Vaccines- Shingrix (1 of 2) Never done   TETANUS/TDAP  06/28/2018   COVID-19 Vaccine (4 - Booster for Pfizer series) 10/12/2020    Colorectal cancer screening: Type of screening: Cologuard. Completed 04/11/21. Repeat every 3 years  Mammogram status: Completed 07/25/21.  Repeat every year  Bone Density status: Completed 06/18/19. Results reflect: Bone density results: OSTEOPENIA. Repeat every 2 years. Ordered today.   Lung Cancer Screening: (Low Dose CT Chest recommended if Age 21-80 years, 30 pack-year currently smoking OR have quit w/in 15years.) does not  qualify.   Additional Screening:  Hepatitis C Screening: does qualify; Completed 04/15/13  Vision Screening: Recommended annual ophthalmology exams for early detection of glaucoma and other disorders of the eye. Is the patient up to date with their annual eye exam?  Yes  Who is the provider or what is the name of the office in which the patient attends annual eye exams? [facility].   Dental Screening: Recommended annual dental exams for proper oral hygiene  Community Resource Referral / Chronic Care Management: CRR required this visit?  No   CCM required this visit?  No      Plan:     I have personally reviewed and noted the following in the patient's chart:   Medical and social history Use of alcohol, tobacco or illicit drugs  Current medications and supplements including opioid prescriptions.  Functional ability and status Nutritional status Physical activity Advanced directives List of other physicians Hospitalizations, surgeries, and ER visits in previous 12 months Vitals Screenings to include cognitive, depression, and falls Referrals and appointments  In addition, I have reviewed and discussed with patient certain preventive protocols, quality metrics, and best practice recommendations. A written personalized care plan for preventive services as well as general preventive health recommendations were provided to patient.     Lorraine Marker, LPN   [id number]   Nurse Notes: none

## 2021-08-30 DIAGNOSIS — I152 Hypertension secondary to endocrine disorders: Secondary | ICD-10-CM | POA: Diagnosis not present

## 2021-08-30 DIAGNOSIS — Z794 Long term (current) use of insulin: Secondary | ICD-10-CM | POA: Diagnosis not present

## 2021-08-30 DIAGNOSIS — E1169 Type 2 diabetes mellitus with other specified complication: Secondary | ICD-10-CM | POA: Diagnosis not present

## 2021-08-30 DIAGNOSIS — E1159 Type 2 diabetes mellitus with other circulatory complications: Secondary | ICD-10-CM | POA: Diagnosis not present

## 2021-08-30 DIAGNOSIS — E1165 Type 2 diabetes mellitus with hyperglycemia: Secondary | ICD-10-CM | POA: Diagnosis not present

## 2021-08-30 DIAGNOSIS — E785 Hyperlipidemia, unspecified: Secondary | ICD-10-CM | POA: Diagnosis not present

## 2021-08-30 LAB — HEMOGLOBIN A1C: Hemoglobin A1C: 9.1

## 2021-09-18 DIAGNOSIS — M9951 Intervertebral disc stenosis of neural canal of cervical region: Secondary | ICD-10-CM | POA: Diagnosis not present

## 2021-09-18 DIAGNOSIS — M25559 Pain in unspecified hip: Secondary | ICD-10-CM | POA: Diagnosis not present

## 2021-09-18 DIAGNOSIS — R5382 Chronic fatigue, unspecified: Secondary | ICD-10-CM | POA: Diagnosis not present

## 2021-09-18 DIAGNOSIS — M792 Neuralgia and neuritis, unspecified: Secondary | ICD-10-CM | POA: Diagnosis not present

## 2021-09-18 DIAGNOSIS — E1142 Type 2 diabetes mellitus with diabetic polyneuropathy: Secondary | ICD-10-CM | POA: Diagnosis not present

## 2021-09-18 DIAGNOSIS — I119 Hypertensive heart disease without heart failure: Secondary | ICD-10-CM | POA: Diagnosis not present

## 2021-09-18 DIAGNOSIS — M179 Osteoarthritis of knee, unspecified: Secondary | ICD-10-CM | POA: Diagnosis not present

## 2021-09-18 DIAGNOSIS — M169 Osteoarthritis of hip, unspecified: Secondary | ICD-10-CM | POA: Diagnosis not present

## 2021-09-18 DIAGNOSIS — R609 Edema, unspecified: Secondary | ICD-10-CM | POA: Diagnosis not present

## 2021-09-18 DIAGNOSIS — M47897 Other spondylosis, lumbosacral region: Secondary | ICD-10-CM | POA: Diagnosis not present

## 2021-09-18 DIAGNOSIS — M48062 Spinal stenosis, lumbar region with neurogenic claudication: Secondary | ICD-10-CM | POA: Diagnosis not present

## 2021-09-18 DIAGNOSIS — G8929 Other chronic pain: Secondary | ICD-10-CM | POA: Diagnosis not present

## 2021-09-18 DIAGNOSIS — M5136 Other intervertebral disc degeneration, lumbar region: Secondary | ICD-10-CM | POA: Diagnosis not present

## 2021-10-11 ENCOUNTER — Other Ambulatory Visit: Payer: Self-pay | Admitting: Family Medicine

## 2021-10-11 NOTE — Telephone Encounter (Signed)
Requested medications are due for refill today.  yes  Requested medications are on the active medications list.  yes  Last refill. 07/16/2021  Future visit scheduled.   yes  Notes to clinic.  Failed protocol d/t expired labs.    Requested Prescriptions  Pending Prescriptions Disp Refills   pravastatin (PRAVACHOL) 40 MG tablet [Pharmacy Med Name: PRAVASTATIN SODIUM 40 MG TAB] 90 tablet 0    Sig: TAKE 1 TABLET BY MOUTH EVERY DAY     Cardiovascular:  Antilipid - Statins Failed - 10/11/2021  1:38 AM      Failed - Total Cholesterol in normal range and within 360 days    Cholesterol, Total  Date Value Ref Range Status  05/12/2015 215 (H) 100 - 199 mg/dL Final   Cholesterol  Date Value Ref Range Status  07/11/2020 146 <200 mg/dL Final          Failed - LDL in normal range and within 360 days    LDL Cholesterol (Calc)  Date Value Ref Range Status  07/11/2020 62 mg/dL (calc) Final    Comment:    Reference range: <100 . Desirable range <100 mg/dL for primary prevention;   <70 mg/dL for patients with CHD or diabetic patients  with > or = 2 CHD risk factors. Marland Kitchen LDL-C is now calculated using the Martin-Hopkins  calculation, which is a validated novel method providing  better accuracy than the Friedewald equation in the  estimation of LDL-C.  Cresenciano Genre et al. Annamaria Helling. 7253;664(40): 2061-2068  (http://education.[facility].com/faq/FAQ164)           Failed - HDL in normal range and within 360 days    HDL  Date Value Ref Range Status  07/11/2020 56 > OR = 50 mg/dL Final  05/12/2015 72 >39 mg/dL Final    Comment:    According to ATP-III Guidelines, HDL-C >59 mg/dL is considered a negative risk factor for CHD.           Failed - Triglycerides in normal range and within 360 days    Triglycerides  Date Value Ref Range Status  07/11/2020 211 (H) <150 mg/dL Final    Comment:    . If a non-fasting specimen was collected, consider repeat triglyceride testing on a fasting  specimen if clinically indicated.  Yates Decamp et al. J. of Clin. Lipidol. 3474;2:595-638. Marland Kitchen           Passed - Patient is not pregnant      Passed - Valid encounter within last 12 months    Recent Outpatient Visits           2 months ago Diabetes mellitus with coincident hypertension [facility])   [facility], Drue Stager, MD   6 months ago Diabetes mellitus with coincident hypertension [facility])   [facility], Drue Stager, MD   11 months ago Diabetes mellitus with coincident hypertension [facility])   [facility] Steele Sizer, MD   1 year ago Diabetes mellitus with coincident hypertension [facility])   [facility] [place], Drue Stager, MD   1 year ago Uncontrolled type 2 diabetes with peripheral autonomic neuropathy [facility])   [facility] Steele Sizer, MD       Future Appointments             In 1 month Ancil Boozer, Drue Stager, MD [facility], [place]   In 10 months  [facility], [facility]

## 2021-10-13 ENCOUNTER — Other Ambulatory Visit: Payer: Self-pay | Admitting: Family Medicine

## 2021-10-13 DIAGNOSIS — E1143 Type 2 diabetes mellitus with diabetic autonomic (poly)neuropathy: Secondary | ICD-10-CM

## 2021-10-13 NOTE — Telephone Encounter (Signed)
Requested medication (s) are due for refill today: yes  Requested medication (s) are on the active medication list: yes  Last refill:  11/14/20 #270 1 RF Future visit scheduled: yes  Notes to clinic:  med not delegated to NT to RF   Requested Prescriptions  Pending Prescriptions Disp Refills   metoCLOPramide (REGLAN) 5 MG tablet [Pharmacy Med Name: METOCLOPRAMIDE 5 MG TABLET] 270 tablet 1    Sig: TAKE 1 TABLET BY MOUTH THREE TIMES A DAY     Not Delegated - Gastroenterology: Antiemetics Failed - 10/13/2021  8:48 AM      Failed - This refill cannot be delegated      Passed - Valid encounter within last 6 months    Recent Outpatient Visits           2 months ago Diabetes mellitus with coincident hypertension [facility])   [facility] [place], Drue Stager, MD   6 months ago Diabetes mellitus with coincident hypertension [facility])   [facility], Drue Stager, MD   11 months ago Diabetes mellitus with coincident hypertension [facility])   [facility] Steele Sizer, MD   1 year ago Diabetes mellitus with coincident hypertension [facility])   [facility], Drue Stager, MD   1 year ago Uncontrolled type 2 diabetes with peripheral autonomic neuropathy [facility])   [facility] Steele Sizer, MD       Future Appointments             In 1 month Ancil Boozer, Drue Stager, MD [facility] [place]   In 10 months  [facility]

## 2021-10-15 ENCOUNTER — Other Ambulatory Visit: Payer: Self-pay

## 2021-10-15 DIAGNOSIS — G894 Chronic pain syndrome: Secondary | ICD-10-CM

## 2021-10-15 DIAGNOSIS — E1169 Type 2 diabetes mellitus with other specified complication: Secondary | ICD-10-CM

## 2021-10-15 DIAGNOSIS — E1129 Type 2 diabetes mellitus with other diabetic kidney complication: Secondary | ICD-10-CM

## 2021-10-15 DIAGNOSIS — Z794 Long term (current) use of insulin: Secondary | ICD-10-CM

## 2021-10-19 ENCOUNTER — Other Ambulatory Visit: Payer: Self-pay | Admitting: Family Medicine

## 2021-11-05 ENCOUNTER — Other Ambulatory Visit: Payer: Self-pay | Admitting: Family Medicine

## 2021-11-05 DIAGNOSIS — I10 Essential (primary) hypertension: Secondary | ICD-10-CM

## 2021-11-05 DIAGNOSIS — E119 Type 2 diabetes mellitus without complications: Secondary | ICD-10-CM

## 2021-11-20 ENCOUNTER — Ambulatory Visit: Payer: Medicare Other | Admitting: Family Medicine

## 2021-12-11 NOTE — Progress Notes (Signed)
Name: Lorraine Jones   MRN: [id number]    DOB: [birth year]   Date:12/12/2021 ? ?     Progress Note ? ?Subjective ? ?Chief Complaint ? ?Follow Up ? ?HPI ? ?DMII with neuropathy,dyslipidemia and gastroparesis, microalbuminuria and mild diabetic retinopathy (diagnosed July 2020). She is now under the care of Dr. Honor Junes, last A1C was done in 08/2021 and it was 9.1 % , up from  7.8 %, last urine micro was positive at 36 ( done at Endo)   She is now on  Metformin and insulin R and N types Long acting 32 units BID and pre meal is 28 units qac She denies polydipsia or polyphagia, or polyuria , she states neuropathy symptoms under control with medication, occasionally feels very full after a meals but controlled with metoclopramide She is compliant with statins and aspirin daily also on ARB. We will recheck lipid panel today   ?  ?HTN in DM:  she is compliant with medication .No chest pain, dizziness  or palpitation. She is on lower dose of Tribenzor 40/5/25 and Carvedilol, BP today is slightly lower than usual, but varies and not having orthostatic changes therefore we will continue current dose  ? ?Chronic Pain: she sees Dr. Primus Bravo  once a month, taking pain medication as prescribed, she has constipation secondary to narcotics and is taking Miralax otc to control symptoms, she has bowel movements ever other day, but not straining or blood in stools.  She has chronic back pain, and pain is 7/10 right now, she states cold weather is making it worse,  usually on lower back, described as nagging toothache that radiates sometimes to right lower leg.She has noticed increase in pain on right groin ( she has OA)  She was advised to have right hip replacement but when A1C was at goal she decided not to have surgery done  ? ?Hyperlipidemia: She stopped Crestor and went back on Pravastatin because of side effects.  She denies myalgias secondary to medication. No chest pain. Last LDL was down to 62,. We will recheck labs today  ?   ?Osteopenia: she had a bone density back in 03/2016 , her FRAX score is 3.2 % for major osteoporotic fracture in the next 10 years and 0.4 for hip . She is taking calcium plus D, last bone density was done 06/2019 and normal spine, mild osteopenia -1.2 and FRAX score was 3.3 % all fractures and 0.4 % for hip fracture, she is taking vitamin D and trying to have a high calcium diet  We will repeat bone density with her next mammogram  ? ?Obesity: BMI is now below 35, she had  lost 10 lbs but gained weight since last visit again. She has co-morbidities including HTN, dyslipidemia and OA  ?  ?Depression is mild recurrent. :  she has a long of major depression, part of it secondary to living in pain and inability to do things she likes. She states Effexor and is thankful to be alive every day. She lost her premature grandson in October 20211  he was born prematurely March 03[birth year] 2020 and died 4 months later in the NICU, she states she is happy that her daughter was able to conceive again and grandson was born 04/2021 . She sates only problem now is pain.  ? ?Patient Active Problem List  ? Diagnosis Date Noted  ? 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 Morbid obesity (  [place]) 12/01/2018  ? Primary osteoarthritis of both hips 02/06/2018  ? Hypertension associated with diabetes ([place]) 07/23/2017  ? Hyperlipidemia due to type 2 diabetes mellitus ([place]) 07/23/2017  ? Major depression, recurrent ([place]) 06/25/2016  ? Osteopenia 04/18/2016  ? Gastroesophageal reflux disease without esophagitis 05/12/2015  ? Chronic pain 05/12/2015  ? Hyperlipidemia 05/12/2015  ? Lumbar post-laminectomy syndrome 02/27/2015  ? Neuropathy due to secondary diabetes ([place]) 02/27/2015  ? Spinal stenosis, lumbar region, with neurogenic claudication 02/27/2015  ? Sacroiliac joint dysfunction of both sides 02/27/2015  ? Degenerative joint disease (DJD) of hip 02/27/2015   ? DDD (degenerative disc disease), cervical 02/27/2015  ? Bilateral occipital neuralgia 02/27/2015  ? Chronic constipation 06/02/2014  ? Hyponatremia 06/01/2014  ? Hypertension, benign 06/01/2014  ? ? ?Past Surgical History:  ?Procedure Laterality Date  ? CHOLECYSTECTOMY  1996  ? CYSTOSCOPY WITH URETEROSCOPY Right 09/01/2013  ? Procedure: CYSTOSCOPY WITH UNROOFING  RIGHT URETEROCELE AND URETERAL STONE REMOVED  WITH GYRUS COLLINS KNIFE. ;  Surgeon: Irine Seal, MD;  Location: [place];  Service: Urology;  Laterality: Right;  cysto, right uretersoscopy and stone extraction  ? ?collins knife  ? [place] [facility]  1993  ? L4  --  L5  ? ? ?Family History  ?Problem Relation Age of Onset  ? Diabetes Mother   ? Heart disease Mother   ? Hypertension Mother   ? Breast cancer Neg Hx   ? ? ?Social History  ? ?Tobacco Use  ? Smoking status: Every Day  ?  Packs/day: 0.50  ?  Years: 20.00  ?  Pack years: 10.00  ?  Types: Cigarettes  ? Smokeless tobacco: Never  ? Tobacco comments:  ?  she is cutting down   ?Substance Use Topics  ? Alcohol use: No  ?  Alcohol/week: 0.0 standard drinks  ? ? ? ?Current Outpatient Medications:  ?  aspirin EC 81 MG tablet, Take 81 mg by mouth daily., Disp: , Rfl:  ?  Blood Glucose Monitoring Suppl (ONETOUCH VERIO) w/Device KIT, , Disp: , Rfl:  ?  carvedilol (COREG) 3.125 MG tablet, TAKE 1 TABLET BY MOUTH 2 TIMES DAILY., Disp: 180 tablet, Rfl: 1 ?  dorzolamide-timolol (COSOPT) 22.3-6.8 MG/ML ophthalmic solution, Place 1 drop into both eyes 2 (two) times daily., Disp: , Rfl:  ?  fentaNYL (DURAGESIC - DOSED MCG/HR) 75 MCG/HR, Apply 2 patches to skin every 2 days if tolerated.   NOTE: Do not apply 100 ?g per hour fentanyl patch since insurance will not approve 100 ?g patch for you, Disp: 30 patch, Rfl: 0 ?  glucose blood test strip, 1 strip by Other route 2 (two) times daily., Disp: , Rfl:  ?  metFORMIN (GLUCOPHAGE) 1000 MG tablet, Take 1 tablet (1,000 mg total) by mouth 2 (two) times  daily with a meal., Disp: 180 tablet, Rfl: 1 ?  metoCLOPramide (REGLAN) 5 MG tablet, TAKE 1 TABLET BY MOUTH THREE TIMES A DAY, Disp: 270 tablet, Rfl: 1 ?  Multiple Vitamins-Minerals (MULTIVITAMIN WITH MINERALS) tablet, Take 1 tablet by mouth daily., Disp: , Rfl:  ?  NOVOLIN N RELION 100 UNIT/ML injection, INJECT 28 UNITS SUBCUTANEOUSLY TWO TIMES DAILY., Disp: , Rfl:  ?  NOVOLIN R RELION 100 UNIT/ML injection, INJECT 20 UNITS SUBCUTANEOUSLY THREE TIMES DAILY BEFORE MEAL(S) (ADD SLIDING SCALE AS NECESSARY), Disp: , Rfl:  ?  Olmesartan-amLODIPine-HCTZ 40-5-25 MG TABS, Take 1 tablet by mouth daily., Disp: 90 tablet, Rfl: 1 ?  Oxycodone HCl 20 MG TABS, LIMIT 1/2  TO 1 TABLET BY MOUTH 3 TO 5 TIMES PER DAY IF TOLERATED *NOTE TABLET IS TWICE AS STRONG*, Disp: , Rfl:  ?  polyethylene glycol (MIRALAX / GLYCOLAX) packet, Take 17 g by mouth daily., Disp: 14 each, Rfl: 0 ?  pravastatin (PRAVACHOL) 40 MG tablet, TAKE 1 TABLET BY MOUTH EVERY DAY, Disp: 90 tablet, Rfl: 0 ?  venlafaxine XR (EFFEXOR-XR) 150 MG 24 hr capsule, Take 1 capsule (150 mg total) by mouth daily with breakfast., Disp: 90 capsule, Rfl: 1 ? ?Allergies  ?Allergen Reactions  ? Diclofenac Sodium Swelling  ? Naproxen Sodium Swelling  ? Etodolac Swelling  ? Gabapentin Swelling  ? Naproxen Swelling  ? ? ?I personally reviewed active problem list, medication list, allergies, family history, social history, health maintenance with the patient/caregiver today. ? ? ?ROS ? ?Constitutional: Negative for fever or weight change.  ?Respiratory: Negative for cough and shortness of breath.   ?Cardiovascular: Negative for chest pain or palpitations.  ?Gastrointestinal: Negative for abdominal pain, no bowel changes.  ?Musculoskeletal: Positive  for gait problem or joint swelling.  ?Skin: Negative for rash.  ?Neurological: Negative for dizziness or headache.  ?No other specific complaints in a complete review of systems (except as listed in HPI above).  ? ?Objective ? ?Vitals:  ?  12/12/21 1015  ?BP: 126/68  ?Pulse: 92  ?Resp: 16  ?SpO2: 96%  ?Weight: 191 lb (86.6 kg)  ?Height: _0  (1.549 m)  ? ? ?Body mass index is 36.09 kg/m?. ? ?Physical Exam ? ?Constitutional: Patient appears well-dev

## 2021-12-12 ENCOUNTER — Ambulatory Visit (INDEPENDENT_AMBULATORY_CARE_PROVIDER_SITE_OTHER): Payer: Medicare Other | Admitting: Family Medicine

## 2021-12-12 ENCOUNTER — Encounter: Payer: Self-pay | Admitting: Family Medicine

## 2021-12-12 VITALS — BP 126/68 | HR 92 | Resp 16 | Ht 61.0 in | Wt 191.0 lb

## 2021-12-12 DIAGNOSIS — I10 Essential (primary) hypertension: Secondary | ICD-10-CM

## 2021-12-12 DIAGNOSIS — Z23 Encounter for immunization: Secondary | ICD-10-CM

## 2021-12-12 DIAGNOSIS — Z780 Asymptomatic menopausal state: Secondary | ICD-10-CM

## 2021-12-12 DIAGNOSIS — E1169 Type 2 diabetes mellitus with other specified complication: Secondary | ICD-10-CM

## 2021-12-12 DIAGNOSIS — E1129 Type 2 diabetes mellitus with other diabetic kidney complication: Secondary | ICD-10-CM

## 2021-12-12 DIAGNOSIS — R809 Proteinuria, unspecified: Secondary | ICD-10-CM

## 2021-12-12 DIAGNOSIS — M8580 Other specified disorders of bone density and structure, unspecified site: Secondary | ICD-10-CM

## 2021-12-12 DIAGNOSIS — E785 Hyperlipidemia, unspecified: Secondary | ICD-10-CM

## 2021-12-12 DIAGNOSIS — Z1231 Encounter for screening mammogram for malignant neoplasm of breast: Secondary | ICD-10-CM

## 2021-12-12 DIAGNOSIS — E119 Type 2 diabetes mellitus without complications: Secondary | ICD-10-CM | POA: Diagnosis not present

## 2021-12-12 DIAGNOSIS — F330 Major depressive disorder, recurrent, mild: Secondary | ICD-10-CM

## 2021-12-12 MED ORDER — OLMESARTAN-AMLODIPINE-HCTZ 40-5-25 MG PO TABS: 1.0000 | ORAL_TABLET | Freq: Every day | ORAL | 1 refills | Status: DC

## 2021-12-12 MED ORDER — SHINGRIX 50 MCG/0.5ML IM SUSR: 0.5000 mL | Freq: Once | INTRAMUSCULAR | 1 refills | Status: AC

## 2021-12-12 MED ORDER — TETANUS-DIPHTH-ACELL PERTUSSIS 5-2-15.5 LF-MCG/0.5 IM SUSP: 0.5000 mL | Freq: Once | INTRAMUSCULAR | 0 refills | Status: AC

## 2021-12-12 MED ORDER — VENLAFAXINE HCL ER 150 MG PO CP24: 150.0000 mg | ORAL_CAPSULE | Freq: Every day | ORAL | 1 refills | Status: DC

## 2021-12-13 LAB — LIPID PANEL
Cholesterol: 152 mg/dL (ref <200)
HDL: 55 mg/dL (ref >=50)
LDL Cholesterol (Calc): 70 mg/dL (calc)
Non-HDL Cholesterol (Calc): 97 mg/dL (calc) (ref <130)
Total CHOL/HDL Ratio: 2.8 (calc) (ref <5.0)
Triglycerides: 205 mg/dL — ABNORMAL HIGH (ref <150)

## 2021-12-13 LAB — COMPLETE METABOLIC PANEL WITH GFR
AG Ratio: 2 (calc) (ref 1.0–2.5)
ALT: 14 U/L (ref 6–29)
AST: 15 U/L (ref 10–35)
Albumin: 4.1 g/dL (ref 3.6–5.1)
Alkaline phosphatase (APISO): 97 U/L (ref 37–153)
BUN/Creatinine Ratio: 11 (calc) (ref 6–22)
BUN: 25 mg/dL (ref 7–25)
CO2: 28 mmol/L (ref 20–32)
Calcium: 9.3 mg/dL (ref 8.6–10.4)
Chloride: 102 mmol/L (ref 98–110)
Creat: 2.24 mg/dL — ABNORMAL HIGH (ref 0.60–1.00)
Globulin: 2.1 g/dL (calc) (ref 1.9–3.7)
Glucose, Bld: 108 mg/dL — ABNORMAL HIGH (ref 65–99)
Potassium: 4.6 mmol/L (ref 3.5–5.3)
Sodium: 141 mmol/L (ref 135–146)
Total Bilirubin: 0.2 mg/dL (ref 0.2–1.2)
Total Protein: 6.2 g/dL (ref 6.1–8.1)
eGFR: 23 mL/min/{1.73_m2} — ABNORMAL LOW (ref >=60)

## 2022-01-08 ENCOUNTER — Other Ambulatory Visit: Payer: Self-pay | Admitting: Family Medicine

## 2022-03-05 LAB — MICROALBUMIN / CREATININE URINE RATIO: Microalb Creat Ratio: 17.2

## 2022-03-05 LAB — HEMOGLOBIN A1C: Hemoglobin A1C: 8.2

## 2022-03-05 LAB — PROTEIN / CREATININE RATIO, URINE
Albumin, U: 28
Creatinine, Urine: 162.9

## 2022-04-12 ENCOUNTER — Other Ambulatory Visit: Payer: Self-pay | Admitting: Internal Medicine

## 2022-04-12 NOTE — Telephone Encounter (Signed)
Requested Prescriptions  Pending Prescriptions Disp Refills  . pravastatin (PRAVACHOL) 40 MG tablet [Pharmacy Med Name: PRAVASTATIN SODIUM 40 MG TAB] 90 tablet 0    Sig: TAKE 1 TABLET BY MOUTH EVERY DAY     Cardiovascular:  Antilipid - Statins Failed - 04/12/2022  2:27 AM      Failed - Lipid Panel in normal range within the last 12 months    Cholesterol, Total  Date Value Ref Range Status  05/12/2015 215 (H) 100 - 199 mg/dL Final   Cholesterol  Date Value Ref Range Status  12/12/2021 152 <200 mg/dL Final   LDL Cholesterol (Calc)  Date Value Ref Range Status  12/12/2021 70 mg/dL (calc) Final    Comment:    Reference range: <100 . Desirable range <100 mg/dL for primary prevention;   <70 mg/dL for patients with CHD or diabetic patients  with > or = 2 CHD risk factors. Marland Kitchen LDL-C is now calculated using the Martin-Hopkins  calculation, which is a validated novel method providing  better accuracy than the Friedewald equation in the  estimation of LDL-C.  Cresenciano Genre et al. Annamaria Helling. 0947;096(28): 2061-2068  (http://education.[facility].com/faq/FAQ164)    HDL  Date Value Ref Range Status  12/12/2021 55 > OR = 50 mg/dL Final  05/12/2015 72 >39 mg/dL Final    Comment:    According to ATP-III Guidelines, HDL-C >59 mg/dL is considered a negative risk factor for CHD.    Triglycerides  Date Value Ref Range Status  12/12/2021 205 (H) <150 mg/dL Final    Comment:    . If a non-fasting specimen was collected, consider repeat triglyceride testing on a fasting specimen if clinically indicated.  Yates Decamp et al. J. of Clin. Lipidol. 3662;9:476-546. Marland Kitchen          Passed - Patient is not pregnant      Passed - Valid encounter within last 12 months    Recent Outpatient Visits          4 months ago Dyslipidemia associated with type 2 diabetes mellitus [facility])   [facility] [place], Drue Stager, MD   8 months ago Diabetes mellitus with coincident hypertension [facility])    [facility] Steele Sizer, MD   1 year ago Diabetes mellitus with coincident hypertension [facility])   [facility] Sizer, MD   1 year ago Diabetes mellitus with coincident hypertension [facility])   [facility] Sizer, MD   1 year ago Diabetes mellitus with coincident hypertension [facility])   [facility] Steele Sizer, MD      Future Appointments            In 4 days Steele Sizer, MD [facility] [place]   In 4 months  [facility], [facility]

## 2022-04-15 NOTE — Progress Notes (Deleted)
Name: Lorraine Jones   MRN: [id number]    DOB: 04-11-1951   Date:04/15/2022       Progress Note  Subjective  Chief Complaint  Follow Up  HPI  DMII with neuropathy,dyslipidemia and gastroparesis, microalbuminuria and mild diabetic retinopathy (diagnosed July 2020). She is now under the care of Dr. Honor Junes, last A1C was done in 08/2021 and it was 9.1 % , up from  7.8 %, last urine micro was positive at 36 ( done at [facility])   She is now on  Metformin and insulin R and N types Long acting 32 units BID and pre meal is 28 units qac She denies polydipsia or polyphagia, or polyuria , she states neuropathy symptoms under control with medication, occasionally feels very full after a meals but controlled with metoclopramide She is compliant with statins and aspirin daily also on ARB. We will recheck lipid panel today     HTN in DM:  she is compliant with medication .No chest pain, dizziness  or palpitation. She is on lower dose of Tribenzor 40/5/25 and Carvedilol, BP today is slightly lower than usual, but varies and not having orthostatic changes therefore we will continue current dose   Chronic Pain: she sees Dr. Primus Bravo  once a month, taking pain medication as prescribed, she has constipation secondary to narcotics and is taking Miralax otc to control symptoms, she has bowel movements ever other day, but not straining or blood in stools.  She has chronic back pain, and pain is 7/10 right now, she states cold weather is making it worse,  usually on lower back, described as nagging toothache that radiates sometimes to right lower leg.She has noticed increase in pain on right groin ( she has OA)  She was advised to have right hip replacement but when A1C was at goal she decided not to have surgery done   Hyperlipidemia: She stopped Crestor and went back on Pravastatin because of side effects.  She denies myalgias secondary to medication. No chest pain. Last LDL was down to 62,. We will recheck labs today     Osteopenia: she had a bone density back in 03/2016 , her FRAX score is 3.2 % for major osteoporotic fracture in the next 10 years and 0.4 for hip . She is taking calcium plus D, last bone density was done 06/2019 and normal spine, mild osteopenia -1.2 and FRAX score was 3.3 % all fractures and 0.4 % for hip fracture, she is taking vitamin D and trying to have a high calcium diet  We will repeat bone density with her next mammogram   Obesity: BMI is now below 35, she had  lost 10 lbs but gained weight since last visit again. She has co-morbidities including HTN, dyslipidemia and OA    Depression is mild recurrent. :  she has a long of major depression, part of it secondary to living in pain and inability to do things she likes. She states Effexor and is thankful to be alive every day. She lost her premature grandson in October 20211  he was born prematurely [birth year] 2020 and died 4 months later in the NICU, she states she is happy that her daughter was able to conceive again and grandson was born 04/2021 . She sates only problem now is pain.   Patient Active Problem List   Diagnosis Date Noted   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 2 diabetes mellitus ([place]) 04/06/2019   Morbid obesity (  [place]) 12/01/2018   Primary osteoarthritis of both hips 02/06/2018   Hypertension associated with diabetes ([place]) 07/23/2017   Hyperlipidemia due to type 2 diabetes mellitus ([place]) 07/23/2017   Major depression, recurrent ([place]) 06/25/2016   Osteopenia 04/18/2016   Gastroesophageal reflux disease without esophagitis 05/12/2015   Chronic pain 05/12/2015   Hyperlipidemia 05/12/2015   Lumbar post-laminectomy syndrome 02/27/2015   Neuropathy due to secondary diabetes ([place]) 02/27/2015   Spinal stenosis, lumbar region, with neurogenic claudication 02/27/2015   Sacroiliac joint dysfunction of both sides 02/27/2015   Degenerative joint disease (DJD) of hip 02/27/2015    DDD (degenerative disc disease), cervical 02/27/2015   Bilateral occipital neuralgia 02/27/2015   Chronic constipation 06/02/2014   Hyponatremia 06/01/2014   Hypertension, benign 06/01/2014    Past Surgical History:  Procedure Laterality Date   CHOLECYSTECTOMY  1996   CYSTOSCOPY WITH URETEROSCOPY Right 09/01/2013   Procedure: CYSTOSCOPY WITH UNROOFING  RIGHT URETEROCELE AND URETERAL STONE REMOVED  WITH GYRUS COLLINS KNIFE. ;  Surgeon: Irine Seal, MD;  Location: [facility];  Service: Urology;  Laterality: Right;  cysto, right uretersoscopy and stone extraction   collins knife   LUMBAR DISC SURGERY  1993   L4  --  L5    Family History  Problem Relation Age of Onset   Diabetes Mother    Heart disease Mother    Hypertension Mother    Breast cancer Neg Hx     Social History   Tobacco Use   Smoking status: Every Day    Packs/day: 0.50    Years: 20.00    Total pack years: 10.00    Types: Cigarettes   Smokeless tobacco: Never   Tobacco comments:    she is cutting down   Substance Use Topics   Alcohol use: No    Alcohol/week: 0.0 standard drinks of alcohol     Current Outpatient Medications:    aspirin EC 81 MG tablet, Take 81 mg by mouth daily., Disp: , Rfl:    Blood Glucose Monitoring Suppl (ONETOUCH VERIO) w/Device KIT, , Disp: , Rfl:    carvedilol (COREG) 3.125 MG tablet, TAKE 1 TABLET BY MOUTH 2 TIMES DAILY., Disp: 180 tablet, Rfl: 1   dorzolamide-timolol (COSOPT) 22.3-6.8 MG/ML ophthalmic solution, Place 1 drop into both eyes 2 (two) times daily., Disp: , Rfl:    fentaNYL (DURAGESIC - DOSED MCG/HR) 75 MCG/HR, Apply 2 patches to skin every 2 days if tolerated.   NOTE: Do not apply 100 g per hour fentanyl patch since insurance will not approve 100 g patch for you, Disp: 30 patch, Rfl: 0   glucose blood test strip, 1 strip by Other route 2 (two) times daily., Disp: , Rfl:    metFORMIN (GLUCOPHAGE) 1000 MG tablet, Take 1 tablet (1,000 mg total) by mouth  2 (two) times daily with a meal., Disp: 180 tablet, Rfl: 1   metoCLOPramide (REGLAN) 5 MG tablet, TAKE 1 TABLET BY MOUTH THREE TIMES A DAY, Disp: 270 tablet, Rfl: 1   Multiple Vitamins-Minerals (MULTIVITAMIN WITH MINERALS) tablet, Take 1 tablet by mouth daily., Disp: , Rfl:    NOVOLIN N RELION 100 UNIT/ML injection, INJECT 28 UNITS SUBCUTANEOUSLY TWO TIMES DAILY., Disp: , Rfl:    NOVOLIN R RELION 100 UNIT/ML injection, INJECT 20 UNITS SUBCUTANEOUSLY THREE TIMES DAILY BEFORE MEAL(S) (ADD SLIDING SCALE AS NECESSARY), Disp: , Rfl:    Olmesartan-amLODIPine-HCTZ 40-5-25 MG TABS, Take 1 tablet by mouth daily., Disp: 90 tablet, Rfl: 1   Oxycodone HCl 20 MG  TABS, LIMIT 1/2 TO 1 TABLET BY MOUTH 3 TO 5 TIMES PER DAY IF TOLERATED *NOTE TABLET IS TWICE AS STRONG*, Disp: , Rfl:    polyethylene glycol (MIRALAX / GLYCOLAX) packet, Take 17 g by mouth daily., Disp: 14 each, Rfl: 0   pravastatin (PRAVACHOL) 40 MG tablet, TAKE 1 TABLET BY MOUTH EVERY DAY, Disp: 90 tablet, Rfl: 0   venlafaxine XR (EFFEXOR-XR) 150 MG 24 hr capsule, Take 1 capsule (150 mg total) by mouth daily with breakfast., Disp: 90 capsule, Rfl: 1  Allergies  Allergen Reactions   Diclofenac Sodium Swelling   Naproxen Sodium Swelling   Etodolac Swelling   Gabapentin Swelling   Naproxen Swelling    I personally reviewed active problem list, medication list, allergies, family history, social history, health maintenance with the patient/caregiver today.   ROS  ***  Objective  There were no vitals filed for this visit.  There is no height or weight on file to calculate BMI.  Physical Exam ***  No results found for this or any previous visit (from the past 2160 hour(s)).  Diabetic Foot Exam: Diabetic Foot Exam - Simple   No data filed    ***  PHQ2/9:    12/12/2021   10:15 AM 08/28/2021    8:15 AM 07/17/2021   10:06 AM 03/26/2021    8:49 AM 11/14/2020    8:39 AM  Depression screen PHQ 2/9  Decreased Interest 0 1 2 0 3   Down, Depressed, Hopeless 0 _0 PHQ - 2 Score 0 _1 Altered sleeping 0 0 0 0 0  Tired, decreased energy 0 1 0 0 0  Change in appetite 0 1 0 0 0  Feeling bad or failure about yourself  0 0 1 0 1  Trouble concentrating 0 0 0 0 0  Moving slowly or fidgety/restless 0 0 0 0 1  Suicidal thoughts 0 0 0 0 0  PHQ-9 Score 0 _2 Difficult doing work/chores  Not difficult at all       phq 9 is {gen pos [id number]}   Fall Risk:    12/12/2021   10:15 AM 08/28/2021    8:17 AM 07/17/2021   10:06 AM 03/26/2021    8:49 AM 11/14/2020    8:38 AM  Fall Risk   Falls in the past year? 0 1 0 0 0  Number falls in past yr: 0 0 0 0 0  Injury with Fall? 0 0 0 0 0  Risk for fall due to : No Fall Risks No Fall Risks Impaired balance/gait    Follow up Falls prevention discussed Falls prevention discussed Falls prevention discussed  Falls evaluation completed      Functional Status Survey:      Assessment & Plan  *** There are no diagnoses linked to this encounter.

## 2022-04-16 ENCOUNTER — Ambulatory Visit: Payer: Medicare Other | Admitting: Family Medicine

## 2022-05-17 ENCOUNTER — Other Ambulatory Visit: Payer: Self-pay | Admitting: Family Medicine

## 2022-05-17 DIAGNOSIS — I10 Essential (primary) hypertension: Secondary | ICD-10-CM

## 2022-05-17 DIAGNOSIS — E119 Type 2 diabetes mellitus without complications: Secondary | ICD-10-CM

## 2022-05-20 NOTE — Progress Notes (Unsigned)
Name: Lorraine Jones   MRN: [id number]    DOB: 02/05/1951   Date:05/21/2022       Progress Note  Subjective  Chief Complaint  Follow Up  HPI  DMII with neuropathy,dyslipidemia and gastroparesis, microalbuminuria ( resolved last labs but GFR dropped )  and mild diabetic retinopathy (diagnosed July 2020). She is now under the care of Dr. Honor Junes, last A1C was done in June and it was 8.2 %  She is now on  Metformin and insulin R and N types Long acting 32 units BID and pre meal is 28 units qac She denies polydipsia or polyphagia, or polyuria , she states neuropathy symptoms under control with medication, occasionally feels very full after a meals but controlled with metoclopramide She is compliant with statins and aspirin daily also on ARB. Last LDL was done March was at 23    HTN in DM:  she is compliant with medication .No chest pain, dizziness  or palpitation. She is on lower dose of Tribenzor 40/5/25 and Carvedilol, BP is at goal, she has some lower extremity edema intermittently and reassurance given   Chronic Pain: she sees Dr. Primus Bravo  once a month, taking pain medication as prescribed, she has constipation secondary to narcotics and is taking Miralax otc to control symptoms, she has bowel movements ever other day, but not straining or blood in stools - [place] of 4 .  She has chronic back pain, and pain is 6/10 right , it can go up to 9/10, pain average is 3/10 usually on lower back, described as nagging toothache that radiates sometimes to right lower leg.She has noticed increase in pain on right groin ( she has OA)  She was advised to have right hip replacement and is thinking about getting it done since A1C has improved. Dr. Primus Bravo advised her to have surgery at [facility]   Hyperlipidemia: She stopped Crestor and went back on Pravastatin because of side effects.  She denies myalgias secondary to medication. No chest pain. Last LDL was 70    Osteopenia: she had a bone density back in  03/2016 , her FRAX score is 3.2 % for major osteoporotic fracture in the next 10 years and 0.4 for hip . She is taking calcium plus D, last bone density was done 06/2019 and normal spine, mild osteopenia -1.2 and FRAX score was 3.3 % all fractures and 0.4 % for hip fracture, she is taking vitamin D and trying to have a high calcium diet  She will call to make the appointment   Obesity: BMI is now below 35, she has lost 9 lbs since last visit.  She has co-morbidities including HTN, dyslipidemia and OA    Depression in remission  :  she has a long of major depression, part of it secondary to living in pain and inability to do things she likes. She states Effexor and is thankful to be alive every day. She lost her premature grandson in October 20211  he was born prematurely 2020/03/24 and died 4 months later in the NICU, she states she is happy that her daughter was able to conceive again and grandson was born 04/2021 . 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 2 diabetes mellitus ([place]) 04/06/2019   Morbid obesity ([place]) 12/01/2018   Primary osteoarthritis of both hips 02/06/2018   Hypertension associated with diabetes ([place]) 07/23/2017   Hyperlipidemia due to type 2 diabetes mellitus ([place]) 07/23/2017   Major depression, recurrent ([place]) 06/25/2016   Osteopenia 04/18/2016   Gastroesophageal reflux disease without esophagitis 05/12/2015   Chronic pain 05/12/2015   Hyperlipidemia 05/12/2015   Lumbar post-laminectomy syndrome 02/27/2015   Neuropathy due to secondary diabetes ([place]) 02/27/2015   Spinal  stenosis, lumbar region, with neurogenic claudication 02/27/2015   Sacroiliac joint dysfunction of both sides 02/27/2015   Degenerative joint disease (DJD) of hip 02/27/2015   DDD (degenerative disc disease), cervical 02/27/2015   Bilateral occipital neuralgia 02/27/2015   Chronic constipation 06/02/2014   Hyponatremia 06/01/2014   Hypertension, benign 06/01/2014    Past Surgical History:  Procedure Laterality Date   CHOLECYSTECTOMY  1996   CYSTOSCOPY WITH URETEROSCOPY Right 09/01/2013   Procedure: CYSTOSCOPY WITH UNROOFING  RIGHT URETEROCELE AND URETERAL STONE REMOVED  WITH GYRUS COLLINS KNIFE. ;  Surgeon: Irine Seal, MD;  Location: [facility];  Service: Urology;  Laterality: Right;  cysto, right uretersoscopy and stone extraction   collins knife   LUMBAR DISC SURGERY  1993   L4  --  L5    Family History  Problem Relation Age of Onset   Diabetes Mother    Heart disease Mother    Hypertension Mother    Breast cancer Neg Hx     Social History   Tobacco Use   Smoking status: Every Day    Packs/day: 0.50    Years: 20.00    Total pack years: 10.00    Types: Cigarettes   Smokeless tobacco: Never   Tobacco comments:    she is cutting down   Substance Use Topics   Alcohol use: No    Alcohol/week: 0.0 standard drinks of alcohol     Current Outpatient Medications:    aspirin EC 81 MG tablet, Take 81 mg by mouth daily., Disp: , Rfl:    Blood Glucose Monitoring Suppl (ONETOUCH VERIO) w/Device KIT, , Disp: , Rfl:    carvedilol (COREG) 3.125 MG tablet, TAKE 1 TABLET BY MOUTH TWICE A DAY, Disp: 180 tablet, Rfl: 1   dorzolamide-timolol (COSOPT) 22.3-6.8 MG/ML ophthalmic solution, Place 1 drop into both eyes 2 (two) times daily., Disp: , Rfl:    fentaNYL (DURAGESIC - DOSED MCG/HR) 75 MCG/HR, Apply 2 patches to skin every 2 days if tolerated.   NOTE: Do not apply 100 g per hour fentanyl patch since insurance will not approve 100 g patch for you, Disp: 30 patch, Rfl:  0   glucose blood test strip, 1 strip by Other route 2 (two) times daily., Disp: , Rfl:    metFORMIN (GLUCOPHAGE) 1000 MG tablet, Take 1 tablet (1,000 mg total) by mouth 2 (two) times daily with a meal., Disp: 180 tablet, Rfl: 1   metoCLOPramide (REGLAN) 5 MG tablet, TAKE 1 TABLET BY MOUTH THREE TIMES A DAY, Disp: 270 tablet, Rfl: 1   Multiple Vitamins-Minerals (MULTIVITAMIN WITH MINERALS) tablet, Take 1 tablet by mouth daily., Disp: , Rfl:    NOVOLIN N RELION 100 UNIT/ML injection, INJECT 28 UNITS SUBCUTANEOUSLY TWO TIMES DAILY., Disp: , Rfl:    NOVOLIN R RELION 100 UNIT/ML injection, INJECT 20 UNITS SUBCUTANEOUSLY THREE TIMES DAILY BEFORE  MEAL(S) (ADD SLIDING SCALE AS NECESSARY), Disp: , Rfl:    Olmesartan-amLODIPine-HCTZ 40-5-25 MG TABS, Take 1 tablet by mouth daily., Disp: 90 tablet, Rfl: 1   Oxycodone HCl 20 MG TABS, LIMIT 1/2 TO 1 TABLET BY MOUTH 3 TO 5 TIMES PER DAY IF TOLERATED *NOTE TABLET IS TWICE AS STRONG*, Disp: , Rfl:    polyethylene glycol (MIRALAX / GLYCOLAX) packet, Take 17 g by mouth daily., Disp: 14 each, Rfl: 0   pravastatin (PRAVACHOL) 40 MG tablet, TAKE 1 TABLET BY MOUTH EVERY DAY, Disp: 90 tablet, Rfl: 0   venlafaxine XR (EFFEXOR-XR) 150 MG 24 hr capsule, Take 1 capsule (150 mg total) by mouth daily with breakfast., Disp: 90 capsule, Rfl: 1  Allergies  Allergen Reactions   Diclofenac Sodium Swelling   Naproxen Sodium Swelling   Etodolac Swelling   Gabapentin Swelling   Naproxen Swelling    I personally reviewed active problem list, medication list, allergies, family history, social history, health maintenance with the patient/caregiver today.   ROS  Constitutional: Negative for fever or weight change.  Respiratory: Negative for cough and shortness of breath.   Cardiovascular: Negative for chest pain or palpitations.  Gastrointestinal: Negative for abdominal pain, no bowel changes.  Musculoskeletal:positive for gait problem but no joint swelling.  Skin:positive   for rash.  Neurological: Negative for dizziness or headache.  No other specific complaints in a complete review of systems (except as listed in HPI above).   Objective  Vitals:   05/21/22 0937  BP: 136/72  Pulse: 96  Resp: 16  SpO2: 97%  Weight: 180 lb (81.6 kg)  Height: 5' 1"  (1.549 m)    Body mass index is 34.01 kg/m.  Physical Exam  Constitutional: Patient appears well-developed and well-nourished. Obese  No distress.  HEENT: head atraumatic, normocephalic, pupils equal and reactive to light, ears: wax on both side, but impacted on right side , neck supple, throat within normal limits, dentures present Cardiovascular: Normal rate, regular rhythm and normal heart sounds.  No murmur heard. No BLE edema. Pulmonary/Chest: Effort normal and breath sounds normal. No respiratory distress. Abdominal: Soft.  There is no tenderness. Psychiatric: Patient has a normal mood and affect. behavior is normal. Judgment and thought content normal.   Recent Results (from the past 2160 hour(s))  Hemoglobin A1c     Status: None   Collection Time: 03/05/22 12:00 AM  Result Value Ref Range   Hemoglobin A1C 8.2     PHQ2/9:    05/21/2022    9:36 AM 12/12/2021   10:15 AM 08/28/2021    8:15 AM 07/17/2021   10:06 AM 03/26/2021    8:49 AM  Depression screen PHQ 2/9  Decreased Interest 0 0 1 2 0  Down, Depressed, Hopeless 0 0 1 2 2   PHQ - 2 Score 0 0 2 4 2   Altered sleeping 0 0 0 0 0  Tired, decreased energy 0 0 1 0 0  Change in appetite 0 0 1 0 0  Feeling bad or failure about yourself  0 0 0 1 0  Trouble concentrating 0 0 0 0 0  Moving slowly or fidgety/restless 0 0 0 0 0  Suicidal thoughts 0 0 0 0 0  PHQ-9 Score 0 0 4 5 2   Difficult doing work/chores   Not difficult at all      phq 9 is negative   Fall Risk:    05/21/2022    9:36 AM 12/12/2021   10:15 AM 08/28/2021  8:17 AM 07/17/2021   10:06 AM 03/26/2021    8:49 AM  Fall Risk   Falls in the past year? 0 0 1 0 0  Number falls  in past yr: 0 0 0 0 0  Injury with Fall? 0 0 0 0 0  Risk for fall due to : Impaired balance/gait No Fall Risks No Fall Risks Impaired balance/gait   Follow up Falls prevention discussed Falls prevention discussed Falls prevention discussed Falls prevention discussed       Functional Status Survey: Is the patient deaf or have difficulty hearing?: Yes Does the patient have difficulty seeing, even when wearing glasses/contacts?: No Does the patient have difficulty concentrating, remembering, or making decisions?: No Does the patient have difficulty walking or climbing stairs?: Yes Does the patient have difficulty dressing or bathing?: No Does the patient have difficulty doing errands alone such as visiting a doctor's office or shopping?: No    Assessment & Plan  1. Dyslipidemia associated with type 2 diabetes mellitus (HCC)  - pravastatin (PRAVACHOL) 40 MG tablet; Take 1 tablet (40 mg total) by mouth daily.  Dispense: 90 tablet; Refill: 1  2. Major depression in remission (HCC)  - venlafaxine XR (EFFEXOR-XR) 150 MG 24 hr capsule; Take 1 capsule (150 mg total) by mouth daily with breakfast.  Dispense: 90 capsule; Refill: 1  3. Diabetes mellitus with coincident hypertension (HCC)  - Olmesartan-amLODIPine-HCTZ 40-5-25 MG TABS; Take 1 tablet by mouth daily.  Dispense: 90 tablet; Refill: 1  4. Rash, skin  - Ambulatory referral to Dermatology  5. Obesity (BMI 30.0-34.9)  Discussed with the patient the risk posed by an increased BMI. Discussed importance of portion control, calorie counting and at least 150 minutes of physical activity weekly. Avoid sweet beverages and drink more water. Eat at least 6 servings of fruit and vegetables daily    6. Impacted cerumen of right ear  Verbal consent given Possible side effects discussed with patient Ears were  lavaged with warm water and peroxide  Patient tolerated procedure well No complications    7. Hypertension, benign  -  Olmesartan-amLODIPine-HCTZ 40-5-25 MG TABS; Take 1 tablet by mouth daily.  Dispense: 90 tablet; Refill: 1  8. Osteopenia after menopause  Needs to get bone density done   9. Chronic pain syndrome  Keep follow up with Dr. Primus Bravo  10. Decreased calculated GFR  - BASIC METABOLIC PANEL WITH GFR  11. Primary osteoarthritis of both hips  - Ambulatory referral to Orthopedic Surgery

## 2022-05-21 ENCOUNTER — Ambulatory Visit (INDEPENDENT_AMBULATORY_CARE_PROVIDER_SITE_OTHER): Payer: Medicare Other | Admitting: Family Medicine

## 2022-05-21 ENCOUNTER — Encounter: Payer: Self-pay | Admitting: Family Medicine

## 2022-05-21 VITALS — BP 136/72 | HR 96 | Resp 16 | Ht 61.0 in | Wt 180.0 lb

## 2022-05-21 DIAGNOSIS — E669 Obesity, unspecified: Secondary | ICD-10-CM | POA: Diagnosis not present

## 2022-05-21 DIAGNOSIS — F325 Major depressive disorder, single episode, in full remission: Secondary | ICD-10-CM

## 2022-05-21 DIAGNOSIS — Z780 Asymptomatic menopausal state: Secondary | ICD-10-CM

## 2022-05-21 DIAGNOSIS — E1169 Type 2 diabetes mellitus with other specified complication: Secondary | ICD-10-CM

## 2022-05-21 DIAGNOSIS — R21 Rash and other nonspecific skin eruption: Secondary | ICD-10-CM

## 2022-05-21 DIAGNOSIS — M160 Bilateral primary osteoarthritis of hip: Secondary | ICD-10-CM

## 2022-05-21 DIAGNOSIS — H6121 Impacted cerumen, right ear: Secondary | ICD-10-CM

## 2022-05-21 DIAGNOSIS — I10 Essential (primary) hypertension: Secondary | ICD-10-CM

## 2022-05-21 DIAGNOSIS — G894 Chronic pain syndrome: Secondary | ICD-10-CM

## 2022-05-21 DIAGNOSIS — R944 Abnormal results of kidney function studies: Secondary | ICD-10-CM

## 2022-05-21 DIAGNOSIS — M8580 Other specified disorders of bone density and structure, unspecified site: Secondary | ICD-10-CM

## 2022-05-21 DIAGNOSIS — E785 Hyperlipidemia, unspecified: Secondary | ICD-10-CM

## 2022-05-21 DIAGNOSIS — E119 Type 2 diabetes mellitus without complications: Secondary | ICD-10-CM

## 2022-05-21 MED ORDER — VENLAFAXINE HCL ER 150 MG PO CP24: 150.0000 mg | ORAL_CAPSULE | Freq: Every day | ORAL | 1 refills | Status: DC

## 2022-05-21 MED ORDER — OLMESARTAN-AMLODIPINE-HCTZ 40-5-25 MG PO TABS: 1.0000 | ORAL_TABLET | Freq: Every day | ORAL | 1 refills | Status: DC

## 2022-05-21 MED ORDER — PRAVASTATIN SODIUM 40 MG PO TABS: 40.0000 mg | ORAL_TABLET | Freq: Every day | ORAL | 1 refills | Status: DC

## 2022-05-21 NOTE — Patient Instructions (Signed)
Ask pharmacist for shingrix, Tdap  You can return here for high dose flu in October New covid vaccine will also be available in October

## 2022-05-22 ENCOUNTER — Other Ambulatory Visit: Payer: Self-pay

## 2022-05-22 DIAGNOSIS — M160 Bilateral primary osteoarthritis of hip: Secondary | ICD-10-CM

## 2022-07-17 ENCOUNTER — Ambulatory Visit (INDEPENDENT_AMBULATORY_CARE_PROVIDER_SITE_OTHER): Payer: Medicare Other | Admitting: Nurse Practitioner

## 2022-07-17 ENCOUNTER — Other Ambulatory Visit: Payer: Self-pay

## 2022-07-17 ENCOUNTER — Encounter: Payer: Self-pay | Admitting: Nurse Practitioner

## 2022-07-17 VITALS — BP 130/72 | HR 98 | Temp 98.1°F | Resp 16 | Wt 188.9 lb

## 2022-07-17 DIAGNOSIS — N898 Other specified noninflammatory disorders of vagina: Secondary | ICD-10-CM | POA: Diagnosis not present

## 2022-07-17 NOTE — Progress Notes (Signed)
   BP 130/72   Pulse 98   Temp 98.1 F (36.7 C) (Oral)   Resp 16   Wt 188 lb 14.4 oz (85.7 kg)   SpO2 98%   BMI 35.69 kg/m    Subjective:    Patient ID: Lorraine Jones, female    DOB: 1951-08-18, 71 y.o.   MRN: [id number]  HPI: Lorraine Jones is a 71 y.o. female  Chief Complaint  Patient presents with   Blister    On vaginal area, painful for 2 weeks   Vaginal lesion:  Patient reports she noticed a blister about 2-3 weeks ago on her vagina.  She says that it is tender. She says there has been no drainage.  On inspection there is a lesion on her right labia.  Will get HSV swab and RPR.  Discussed treatment options if positive for either.   Relevant past medical, surgical, family and social history reviewed and updated as indicated. Interim medical history since our last visit reviewed. Allergies and medications reviewed and updated.  Review of Systems  Constitutional: Negative for fever or weight change.  Respiratory: Negative for cough and shortness of breath.   Cardiovascular: Negative for chest pain or palpitations.  Gastrointestinal: Negative for abdominal pain, no bowel changes.  Musculoskeletal: Negative for gait problem or joint swelling.  Skin: Negative for rash. Positive for skin lesion on vagina Neurological: Negative for dizziness or headache.  No other specific complaints in a complete review of systems (except as listed in HPI above).      Objective:    BP 130/72   Pulse 98   Temp 98.1 F (36.7 C) (Oral)   Resp 16   Wt 188 lb 14.4 oz (85.7 kg)   SpO2 98%   BMI 35.69 kg/m   Wt Readings from Last 3 Encounters:  07/17/22 188 lb 14.4 oz (85.7 kg)  05/21/22 180 lb (81.6 kg)  12/12/21 191 lb (86.6 kg)    Physical Exam  Constitutional: Patient appears well-developed and well-nourished. Obese  No distress.  HEENT: head atraumatic, normocephalic, pupils equal and reactive to light, neck supple Cardiovascular: Normal rate, regular rhythm and normal heart  sounds.  No murmur heard. No BLE edema. Pulmonary/Chest: Effort normal and breath sounds normal. No respiratory distress. Abdominal: Soft.  There is no tenderness. Pelvic exam: normal external genitalia, vulva, vagina, cervix, uterus and adnexa, VULVA: vulvar lesion on right.  Psychiatric: Patient has a normal mood and affect. behavior is normal. Judgment and thought content normal.  Results for orders placed or performed in visit on 04/15/22  Hemoglobin A1c  Result Value Ref Range   Hemoglobin A1C 8.2       Assessment & Plan:   Problem List Items Addressed This Visit   None Visit Diagnoses     Vaginal lesion    -  Primary   Will get HSV sab and RPR.  Treatment pending results.    Relevant Orders   Herpes simplex virus culture   RPR        Follow up plan: Return if symptoms worsen or fail to improve.

## 2022-07-19 ENCOUNTER — Other Ambulatory Visit: Payer: Self-pay | Admitting: Nurse Practitioner

## 2022-07-19 DIAGNOSIS — N898 Other specified noninflammatory disorders of vagina: Secondary | ICD-10-CM

## 2022-07-19 LAB — HERPES SIMPLEX VIRUS CULTURE
MICRO NUMBER:: 14069561
SPECIMEN QUALITY:: ADEQUATE

## 2022-07-19 LAB — RPR: RPR Ser Ql: NONREACTIVE

## 2022-07-24 ENCOUNTER — Other Ambulatory Visit (HOSPITAL_COMMUNITY)
Admission: RE | Admit: 2022-07-24 | Discharge: 2022-07-24 | Disposition: A | Discharge status: Home / Self Care | Payer: Medicare Other | Source: Ambulatory Visit | Attending: Obstetrics and Gynecology | Admitting: Obstetrics and Gynecology

## 2022-07-24 ENCOUNTER — Ambulatory Visit: Payer: Medicare Other | Admitting: Obstetrics and Gynecology

## 2022-07-24 ENCOUNTER — Encounter: Payer: Self-pay | Admitting: Obstetrics and Gynecology

## 2022-07-24 VITALS — BP 145/82 | HR 89 | Ht 61.0 in | Wt 189.0 lb

## 2022-07-24 DIAGNOSIS — N9089 Other specified noninflammatory disorders of vulva and perineum: Secondary | ICD-10-CM

## 2022-07-24 DIAGNOSIS — D071 Carcinoma in situ of vulva: Secondary | ICD-10-CM

## 2022-07-24 DIAGNOSIS — Z7689 Persons encountering health services in other specified circumstances: Secondary | ICD-10-CM

## 2022-07-24 DIAGNOSIS — N898 Other specified noninflammatory disorders of vagina: Secondary | ICD-10-CM

## 2022-07-24 NOTE — Progress Notes (Signed)
Patient presents today to discuss vaginal lesion. She states noticing an area 2-3 weeks ago that began to itch. Patient also states an increase in discharge and odor. She was seen by PCP who ruled out RPR and HSV. No other concerns at this time.

## 2022-07-24 NOTE — Progress Notes (Signed)
HPI:      [name] is a 71 y.o. [phone] who LMP was No LMP recorded. Patient is postmenopausal.  Subjective:   She presents today because she has recently begun having vulvar itching and noticed a blister on her labia.  She went to her PCP who did HSV and RPR which have proven to be negative.  She continues to experience pain and itching in this area.  She also states that she has some discharge and that the discharge occasionally has an odor. She reports no previous issues with abnormal Pap smears.    Hx: The following portions of the patient's history were reviewed and updated as appropriate:             She  has a past medical history of Arthritis, Chronic low back pain, Depression, Diabetes mellitus, Hyperlipidemia, and Hypertension. She does not have any pertinent problems on file. She  has a past surgical history that includes Cholecystectomy (1996); Lumbar disc surgery (1993); and Cystoscopy with ureteroscopy (Right, 09/01/2013). Her family history includes Diabetes in her mother; Heart disease in her mother; Hypertension in her mother. She  reports that she has been smoking cigarettes. She has a 10.00 pack-year smoking history. She has never used smokeless tobacco. She reports that she does not drink alcohol and does not use drugs. She has a current medication list which includes the following prescription(s): aspirin ec, onetouch verio, carvedilol, dorzolamide-timolol, fentanyl, glucose blood, metformin, metoclopramide, multivitamin with minerals, novolin n relion, novolin r relion, olmesartan-amlodipine-hctz, oxycodone hcl, polyethylene glycol, pravastatin, and venlafaxine xr. She is allergic to diclofenac sodium, naproxen sodium, etodolac, gabapentin, and naproxen.       Review of Systems:  Review of Systems  Constitutional: Denied constitutional symptoms, night sweats, recent illness, fatigue, fever, insomnia and weight loss.  Eyes: Denied eye symptoms, eye pain,  photophobia, vision change and visual disturbance.  Ears/Nose/Throat/Neck: Denied ear, nose, throat or neck symptoms, hearing loss, nasal discharge, sinus congestion and sore throat.  Cardiovascular: Denied cardiovascular symptoms, arrhythmia, chest pain/pressure, edema, exercise intolerance, orthopnea and palpitations.  Respiratory: Denied pulmonary symptoms, asthma, pleuritic pain, productive sputum, cough, dyspnea and wheezing.  Gastrointestinal: Denied, gastro-esophageal reflux, melena, nausea and vomiting.  Genitourinary: See HPI for additional information.  Musculoskeletal: Denied musculoskeletal symptoms, stiffness, swelling, muscle weakness and myalgia.  Dermatologic: Denied dermatology symptoms, rash and scar.  Neurologic: Denied neurology symptoms, dizziness, headache, neck pain and syncope.  Psychiatric: Denied psychiatric symptoms, anxiety and depression.  Endocrine: Denied endocrine symptoms including hot flashes and night sweats.   Meds:   Current Outpatient Medications on File Prior to Visit  Medication Sig Dispense Refill   aspirin EC 81 MG tablet Take 81 mg by mouth daily.     Blood Glucose Monitoring Suppl (ONETOUCH VERIO) w/Device KIT      carvedilol (COREG) 3.125 MG tablet TAKE 1 TABLET BY MOUTH TWICE A DAY 180 tablet 1   dorzolamide-timolol (COSOPT) 22.3-6.8 MG/ML ophthalmic solution Place 1 drop into both eyes 2 (two) times daily.     fentaNYL (DURAGESIC - DOSED MCG/HR) 75 MCG/HR Apply 2 patches to skin every 2 days if tolerated.   NOTE: Do not apply 100 g per hour fentanyl patch since insurance will not approve 100 g patch for you 30 patch 0   glucose blood test strip 1 strip by Other route 2 (two) times daily.     metFORMIN (GLUCOPHAGE) 1000 MG tablet Take 1 tablet (1,000 mg total) by mouth 2 (two) times daily with  a meal. 180 tablet 1   metoCLOPramide (REGLAN) 5 MG tablet TAKE 1 TABLET BY MOUTH THREE TIMES A DAY 270 tablet 1   Multiple Vitamins-Minerals  (MULTIVITAMIN WITH MINERALS) tablet Take 1 tablet by mouth daily.     NOVOLIN N RELION 100 UNIT/ML injection INJECT 28 UNITS SUBCUTANEOUSLY TWO TIMES DAILY.     NOVOLIN R RELION 100 UNIT/ML injection INJECT 20 UNITS SUBCUTANEOUSLY THREE TIMES DAILY BEFORE MEAL(S) (ADD SLIDING SCALE AS NECESSARY)     Olmesartan-amLODIPine-HCTZ 40-5-25 MG TABS Take 1 tablet by mouth daily. 90 tablet 1   Oxycodone HCl 20 MG TABS LIMIT 1/2 TO 1 TABLET BY MOUTH 3 TO 5 TIMES PER DAY IF TOLERATED *NOTE TABLET IS TWICE AS STRONG*     polyethylene glycol (MIRALAX / GLYCOLAX) packet Take 17 g by mouth daily. 14 each 0   pravastatin (PRAVACHOL) 40 MG tablet Take 1 tablet (40 mg total) by mouth daily. 90 tablet 1   venlafaxine XR (EFFEXOR-XR) 150 MG 24 hr capsule Take 1 capsule (150 mg total) by mouth daily with breakfast. 90 capsule 1   No current facility-administered medications on file prior to visit.      Objective:     Vitals:   07/24/22 1122  BP: (!) 145/82  Pulse: 89   Filed Weights   07/24/22 1122  Weight: 189 lb (85.7 kg)              Physical examination   Pelvic:   Vulva: Right labial lesion with leukoplakia and discolored draining area.  Rugated and thickened.        Urethra No masses tenderness or scarring.  Meatus Normal size without lesions or prolapse.  Cervix: Normal appearance.  No lesions.  Anus: Normal exam.  No lesions.  Perineum: Normal exam.  No lesions.   Vulvar biopsy performed  Area cleansed with Betadine  Lidocaine with epinephrine injected  Biopsy of lesion performed X2  Hemostasis obtained with silver nitrate.          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 2 diabetes mellitus ([place]) 04/06/2019   Primary osteoarthritis of both hips 02/06/2018   Hypertension associated with diabetes ([place]) 07/23/2017   Hyperlipidemia due to type 2  diabetes mellitus ([place]) 07/23/2017   Major depression, recurrent ([place]) 06/25/2016   Osteopenia 04/18/2016   Gastroesophageal reflux disease without esophagitis 05/12/2015   Chronic pain 05/12/2015   Hyperlipidemia 05/12/2015   Lumbar post-laminectomy syndrome 02/27/2015   Neuropathy due to secondary diabetes ([place]) 02/27/2015   Spinal stenosis, lumbar region, with neurogenic claudication 02/27/2015   Sacroiliac joint dysfunction of both sides 02/27/2015   Degenerative joint disease (DJD) of hip 02/27/2015   DDD (degenerative disc disease), cervical 02/27/2015   Bilateral occipital neuralgia 02/27/2015   Chronic constipation 06/02/2014   Hypertension, benign 06/01/2014     1. Establishing care with new doctor, encounter for   2. Vulvar lesion        Plan:            1.  Await biopsy results Orders No orders of the defined types were placed in this encounter.   No orders of the defined types were placed in this encounter.     F/U  Return in about 2 weeks (around 08/07/2022). I spent 32 minutes involved in the care of this patient preparing to see the patient by obtaining  and reviewing her medical history (including labs, imaging tests and prior procedures), documenting clinical information in the electronic health record (EHR), counseling and coordinating care plans, writing and sending prescriptions, ordering tests or procedures and in direct communicating with the patient and medical staff discussing pertinent items from her history and physical exam.  Finis Bud, M.D. 07/24/2022 11:49 AM

## 2022-07-25 ENCOUNTER — Encounter: Payer: Self-pay | Admitting: Obstetrics and Gynecology

## 2022-07-25 LAB — SURGICAL PATHOLOGY

## 2022-07-28 NOTE — Progress Notes (Signed)
Based on these results, please have her schedule a visit with me.

## 2022-07-29 ENCOUNTER — Ambulatory Visit (INDEPENDENT_AMBULATORY_CARE_PROVIDER_SITE_OTHER): Payer: Medicare Other

## 2022-07-29 DIAGNOSIS — Z23 Encounter for immunization: Secondary | ICD-10-CM

## 2022-08-07 ENCOUNTER — Ambulatory Visit: Payer: Medicare Other | Admitting: Obstetrics and Gynecology

## 2022-08-07 ENCOUNTER — Other Ambulatory Visit: Payer: Self-pay

## 2022-08-07 ENCOUNTER — Encounter: Payer: Self-pay | Admitting: Obstetrics and Gynecology

## 2022-08-07 ENCOUNTER — Telehealth: Payer: Self-pay

## 2022-08-07 ENCOUNTER — Inpatient Hospital Stay: Payer: Medicare Other | Attending: Obstetrics and Gynecology | Admitting: Obstetrics and Gynecology

## 2022-08-07 VITALS — BP 142/58 | HR 83 | Temp 96.7°F | Resp 19 | Wt 193.4 lb

## 2022-08-07 VITALS — BP 147/66 | HR 89 | Ht 61.0 in | Wt 193.9 lb

## 2022-08-07 DIAGNOSIS — C519 Malignant neoplasm of vulva, unspecified: Secondary | ICD-10-CM | POA: Insufficient documentation

## 2022-08-07 DIAGNOSIS — Z6836 Body mass index (BMI) 36.0-36.9, adult: Secondary | ICD-10-CM | POA: Diagnosis not present

## 2022-08-07 DIAGNOSIS — K219 Gastro-esophageal reflux disease without esophagitis: Secondary | ICD-10-CM | POA: Diagnosis not present

## 2022-08-07 DIAGNOSIS — N9089 Other specified noninflammatory disorders of vulva and perineum: Secondary | ICD-10-CM

## 2022-08-07 DIAGNOSIS — M5030 Other cervical disc degeneration, unspecified cervical region: Secondary | ICD-10-CM | POA: Insufficient documentation

## 2022-08-07 DIAGNOSIS — N898 Other specified noninflammatory disorders of vagina: Secondary | ICD-10-CM | POA: Diagnosis not present

## 2022-08-07 DIAGNOSIS — M169 Osteoarthritis of hip, unspecified: Secondary | ICD-10-CM | POA: Insufficient documentation

## 2022-08-07 DIAGNOSIS — M8580 Other specified disorders of bone density and structure, unspecified site: Secondary | ICD-10-CM | POA: Insufficient documentation

## 2022-08-07 DIAGNOSIS — Z79899 Other long term (current) drug therapy: Secondary | ICD-10-CM | POA: Insufficient documentation

## 2022-08-07 DIAGNOSIS — E113293 Type 2 diabetes mellitus with mild nonproliferative diabetic retinopathy without macular edema, bilateral: Secondary | ICD-10-CM | POA: Insufficient documentation

## 2022-08-07 DIAGNOSIS — E1159 Type 2 diabetes mellitus with other circulatory complications: Secondary | ICD-10-CM | POA: Insufficient documentation

## 2022-08-07 DIAGNOSIS — G893 Neoplasm related pain (acute) (chronic): Secondary | ICD-10-CM | POA: Insufficient documentation

## 2022-08-07 DIAGNOSIS — M461 Sacroiliitis, not elsewhere classified: Secondary | ICD-10-CM | POA: Insufficient documentation

## 2022-08-07 DIAGNOSIS — Z7984 Long term (current) use of oral hypoglycemic drugs: Secondary | ICD-10-CM | POA: Diagnosis not present

## 2022-08-07 DIAGNOSIS — Z794 Long term (current) use of insulin: Secondary | ICD-10-CM | POA: Insufficient documentation

## 2022-08-07 DIAGNOSIS — I10 Essential (primary) hypertension: Secondary | ICD-10-CM | POA: Diagnosis not present

## 2022-08-07 DIAGNOSIS — M160 Bilateral primary osteoarthritis of hip: Secondary | ICD-10-CM | POA: Insufficient documentation

## 2022-08-07 DIAGNOSIS — E785 Hyperlipidemia, unspecified: Secondary | ICD-10-CM | POA: Insufficient documentation

## 2022-08-07 DIAGNOSIS — F17210 Nicotine dependence, cigarettes, uncomplicated: Secondary | ICD-10-CM | POA: Diagnosis not present

## 2022-08-07 DIAGNOSIS — Z7982 Long term (current) use of aspirin: Secondary | ICD-10-CM | POA: Insufficient documentation

## 2022-08-07 LAB — WET PREP, GENITAL
Clue Cells Wet Prep HPF POC: NONE SEEN
Sperm: NONE SEEN
Trich, Wet Prep: NONE SEEN
WBC, Wet Prep HPF POC: >=10 — AB (ref <10)

## 2022-08-07 MED ORDER — FLUCONAZOLE 150 MG PO TABS: 150.0000 mg | ORAL_TABLET | Freq: Every day | ORAL | 0 refills | Status: AC

## 2022-08-07 NOTE — Progress Notes (Signed)
HPI:      [name] is a 71 y.o. [phone] who LMP was No LMP recorded. Patient is postmenopausal.  Subjective:   She presents today to discuss her vulvar biopsy. (See previous note for details)    Hx: The following portions of the patient's history were reviewed and updated as appropriate:             She  has a past medical history of Arthritis, Chronic low back pain, Depression, Diabetes mellitus, Hyperlipidemia, and Hypertension. She does not have any pertinent problems on file. She  has a past surgical history that includes Cholecystectomy (1996); Lumbar disc surgery (1993); and Cystoscopy with ureteroscopy (Right, 09/01/2013). Her family history includes Diabetes in her mother; Heart disease in her mother; Hypertension in her mother. She  reports that she has been smoking cigarettes. She has a 10.00 pack-year smoking history. She has never used smokeless tobacco. She reports that she does not drink alcohol and does not use drugs. She has a current medication list which includes the following prescription(s): aspirin ec, onetouch verio, carvedilol, dorzolamide-timolol, fentanyl, glucose blood, metformin, metoclopramide, multivitamin with minerals, novolin n relion, novolin r relion, olmesartan-amlodipine-hctz, oxycodone hcl, polyethylene glycol, pravastatin, and venlafaxine xr. She is allergic to diclofenac sodium, naproxen sodium, etodolac, gabapentin, and naproxen.       Review of Systems:  Review of Systems  Constitutional: Denied constitutional symptoms, night sweats, recent illness, fatigue, fever, insomnia and weight loss.  Eyes: Denied eye symptoms, eye pain, photophobia, vision change and visual disturbance.  Ears/Nose/Throat/Neck: Denied ear, nose, throat or neck symptoms, hearing loss, nasal discharge, sinus congestion and sore throat.  Cardiovascular: Denied cardiovascular symptoms, arrhythmia, chest pain/pressure, edema, exercise intolerance, orthopnea and palpitations.   Respiratory: Denied pulmonary symptoms, asthma, pleuritic pain, productive sputum, cough, dyspnea and wheezing.  Gastrointestinal: Denied, gastro-esophageal reflux, melena, nausea and vomiting.  Genitourinary: See HPI for additional information.  Musculoskeletal: Denied musculoskeletal symptoms, stiffness, swelling, muscle weakness and myalgia.  Dermatologic: Denied dermatology symptoms, rash and scar.  Neurologic: Denied neurology symptoms, dizziness, headache, neck pain and syncope.  Psychiatric: Denied psychiatric symptoms, anxiety and depression.  Endocrine: Denied endocrine symptoms including hot flashes and night sweats.   Meds:   Current Outpatient Medications on File Prior to Visit  Medication Sig Dispense Refill   aspirin EC 81 MG tablet Take 81 mg by mouth daily.     Blood Glucose Monitoring Suppl (ONETOUCH VERIO) w/Device KIT      carvedilol (COREG) 3.125 MG tablet TAKE 1 TABLET BY MOUTH TWICE A DAY 180 tablet 1   dorzolamide-timolol (COSOPT) 22.3-6.8 MG/ML ophthalmic solution Place 1 drop into both eyes 2 (two) times daily.     fentaNYL (DURAGESIC - DOSED MCG/HR) 75 MCG/HR Apply 2 patches to skin every 2 days if tolerated.   NOTE: Do not apply 100 g per hour fentanyl patch since insurance will not approve 100 g patch for you 30 patch 0   glucose blood test strip 1 strip by Other route 2 (two) times daily.     metFORMIN (GLUCOPHAGE) 1000 MG tablet Take 1 tablet (1,000 mg total) by mouth 2 (two) times daily with a meal. 180 tablet 1   metoCLOPramide (REGLAN) 5 MG tablet TAKE 1 TABLET BY MOUTH THREE TIMES A DAY 270 tablet 1   Multiple Vitamins-Minerals (MULTIVITAMIN WITH MINERALS) tablet Take 1 tablet by mouth daily.     NOVOLIN N RELION 100 UNIT/ML injection INJECT 28 UNITS SUBCUTANEOUSLY TWO TIMES DAILY.  NOVOLIN R RELION 100 UNIT/ML injection INJECT 20 UNITS SUBCUTANEOUSLY THREE TIMES DAILY BEFORE MEAL(S) (ADD SLIDING SCALE AS NECESSARY)     Olmesartan-amLODIPine-HCTZ  40-5-25 MG TABS Take 1 tablet by mouth daily. 90 tablet 1   Oxycodone HCl 20 MG TABS LIMIT 1/2 TO 1 TABLET BY MOUTH 3 TO 5 TIMES PER DAY IF TOLERATED *NOTE TABLET IS TWICE AS STRONG*     polyethylene glycol (MIRALAX / GLYCOLAX) packet Take 17 g by mouth daily. 14 each 0   pravastatin (PRAVACHOL) 40 MG tablet Take 1 tablet (40 mg total) by mouth daily. 90 tablet 1   venlafaxine XR (EFFEXOR-XR) 150 MG 24 hr capsule Take 1 capsule (150 mg total) by mouth daily with breakfast. 90 capsule 1   No current facility-administered medications on file prior to visit.      Objective:     Vitals:   08/07/22 0842  BP: (!) 147/66  Pulse: 89   Filed Weights   08/07/22 0842  Weight: 193 lb 14.4 oz (88 kg)              Vulvar biopsies reveal squamous cell carcinoma          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 2 diabetes mellitus ([place]) 04/06/2019   Primary osteoarthritis of both hips 02/06/2018   Hypertension associated with diabetes ([place]) 07/23/2017   Hyperlipidemia due to type 2 diabetes mellitus ([place]) 07/23/2017   Major depression, recurrent ([place]) 06/25/2016   Osteopenia 04/18/2016   Gastroesophageal reflux disease without esophagitis 05/12/2015   Chronic pain 05/12/2015   Hyperlipidemia 05/12/2015   Lumbar post-laminectomy syndrome 02/27/2015   Neuropathy due to secondary diabetes ([place]) 02/27/2015   Spinal stenosis, lumbar region, with neurogenic claudication 02/27/2015   Sacroiliac joint dysfunction of both sides 02/27/2015   Degenerative joint disease (DJD) of hip 02/27/2015   DDD (degenerative disc disease), cervical 02/27/2015   Bilateral occipital neuralgia 02/27/2015   Chronic constipation 06/02/2014   Hypertension, benign 06/01/2014     1. Squamous cell carcinoma of vulva (HCC)   2. Vulvar lesion        Plan:            1.   Discussed vulvar carcinoma with patient.  She is now aware of the diagnosis.  Referral to GYN oncology today. Orders No orders of the defined types were placed in this encounter.   No orders of the defined types were placed in this encounter.     F/U  No follow-ups on file. I spent 18 minutes involved in the care of this patient preparing to see the patient by obtaining and reviewing her medical history (including labs, imaging tests and prior procedures), documenting clinical information in the electronic health record (EHR), counseling and coordinating care plans, writing and sending prescriptions, ordering tests or procedures and in direct communicating with the patient and medical staff discussing pertinent items from her history and physical exam.  Finis Bud, M.D. 08/07/2022 9:31 AM

## 2022-08-07 NOTE — Progress Notes (Signed)
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  Subjective:  Lorraine Jones is a 71 y.o. female who is seen in consultation from Dr. Amalia Hailey for vulvar cancer.   Seen by Dr Amalia Hailey 07/24/22.   She has recently begun having vulvar itching and irritation over the past month and noticed a blister on her labia.  She went to her PCP who did HSV and RPR which have proven to be negative.  She continues to experience pain and itching in this area.  She also states that she has some discharge and that the discharge occasionally has an odor. She reports no previous issues with abnormal Pap smears. Exam: Right labial lesion with leukoplakia and discolored draining area.  Rugated and thickened.   A.   VULVA, BIOPSY:  -      INVASIVE SQUAMOUS CELL CARCINOMA, WELL-DIFFERENTIATED.   B.   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   She has some discharge.  No bleeding.   Some SI and Hip DJD and walks with cane.    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 2 diabetes mellitus ([place]) 04/06/2019   Primary osteoarthritis of both hips 02/06/2018   Hypertension associated with diabetes ([place]) 07/23/2017   Hyperlipidemia due to type 2 diabetes mellitus ([place]) 07/23/2017   Major depression, recurrent ([place]) 06/25/2016   Osteopenia 04/18/2016   Gastroesophageal reflux disease without esophagitis 05/12/2015   Chronic pain 05/12/2015   Hyperlipidemia 05/12/2015   Lumbar post-laminectomy syndrome 02/27/2015   Neuropathy due to secondary diabetes ([place]) 02/27/2015   Spinal stenosis, lumbar region, with neurogenic claudication 02/27/2015   Sacroiliac joint dysfunction of both sides 02/27/2015   Degenerative joint disease (DJD) of hip 02/27/2015   DDD (degenerative disc  disease), cervical 02/27/2015   Bilateral occipital neuralgia 02/27/2015   Chronic constipation 06/02/2014   Hypertension, benign 06/01/2014    Past Medical History: Past Medical History:  Diagnosis Date   Arthritis    Chronic low back pain    Depression    Diabetes mellitus    Hyperlipidemia    Hypertension     Past Surgical History: Past Surgical History:  Procedure Laterality Date   CHOLECYSTECTOMY  1996   CYSTOSCOPY WITH URETEROSCOPY Right 09/01/2013   Procedure: CYSTOSCOPY WITH UNROOFING  RIGHT URETEROCELE AND URETERAL STONE REMOVED  WITH GYRUS COLLINS KNIFE. ;  Surgeon: Irine Seal, MD;  Location: [facility];  Service: Urology;  Laterality: Right;  cysto, right uretersoscopy and stone extraction   collins knife   LUMBAR DISC SURGERY  1993   L4  --  L5      OB History:  OB History  Gravida Para Term Preterm AB Living  _0 SAB IAB Ectopic Multiple Live Births          3    # Outcome Date GA Lbr Len/2nd Weight Sex Delivery Anes PTL Lv  3 Term 1982     Vag-Spont   LIV  2 Term 1974     Vag-Spont   LIV  1 Term 48     Vag-Spont   LIV    Family History: Family History  Problem Relation Age of Onset   Diabetes Mother    Heart disease Mother  Hypertension Mother    Breast cancer Neg Hx     Social History: Social History   Socioeconomic History   Marital status: Married    Spouse name: Not on file   Number of children: 3   Years of education: Not on file   Highest education level: 12th grade  Occupational History   Occupation: retired  Tobacco Use   Smoking status: Every Day    Packs/day: 0.50    Years: 20.00    Total pack years: 10.00    Types: Cigarettes   Smokeless tobacco: Never   Tobacco comments:    she is cutting down   Vaping Use   Vaping Use: Never used  Substance and Sexual Activity   Alcohol use: No    Alcohol/week: 0.0 standard drinks of alcohol   Drug use: No   Sexual activity: Not Currently    Partners:  Male    Birth control/protection: Post-menopausal  Other Topics Concern   Not on file  Social History Narrative   Not on file   Social Determinants of Health   Financial Resource Strain: Low Risk  (08/28/2021)   Overall Financial Resource Strain (CARDIA)    Difficulty of Paying Living Expenses: Not very hard  Food Insecurity: No Food Insecurity (08/28/2021)   Hunger Vital Sign    Worried About Running Out of Food in the Last Year: Never true    Ran Out of Food in the Last Year: Never true  Transportation Needs: No Transportation Needs (08/28/2021)   PRAPARE - Hydrologist (Medical): No    Lack of Transportation (Non-Medical): No  Physical Activity: Inactive (08/28/2021)   Exercise Vital Sign    Days of Exercise per Week: 0 days    Minutes of Exercise per Session: 0 min  Stress: No Stress Concern Present (08/28/2021)   [place]    Feeling of Stress : Not at all  Social Connections: Moderately Integrated (08/28/2021)   Social Connection and Isolation Panel [NHANES]    Frequency of Communication with Friends and Family: More than three times a week    Frequency of Social Gatherings with Friends and Family: Three times a week    Attends Religious Services: More than 4 times per year    Active Member of Clubs or Organizations: No    Attends Archivist Meetings: Never    Marital Status: Married  Human resources officer Violence: Not At Risk (08/28/2021)   Humiliation, Afraid, Rape, and Kick questionnaire    Fear of Current or Ex-Partner: No    Emotionally Abused: No    Physically Abused: No    Sexually Abused: No    Allergies: Allergies  Allergen Reactions   Diclofenac Sodium Swelling   Naproxen Sodium Swelling   Etodolac Swelling   Gabapentin Swelling   Naproxen Swelling    Current Medications: Current Outpatient Medications  Medication Sig Dispense Refill   aspirin EC  81 MG tablet Take 81 mg by mouth daily.     Blood Glucose Monitoring Suppl (ONETOUCH VERIO) w/Device KIT      carvedilol (COREG) 3.125 MG tablet TAKE 1 TABLET BY MOUTH TWICE A DAY 180 tablet 1   dorzolamide-timolol (COSOPT) 22.3-6.8 MG/ML ophthalmic solution Place 1 drop into both eyes 2 (two) times daily.     fentaNYL (DURAGESIC - DOSED MCG/HR) 75 MCG/HR Apply 2 patches to skin every 2 days if tolerated.   NOTE: Do not apply 100 g  per hour fentanyl patch since insurance will not approve 100 g patch for you 30 patch 0   glucose blood test strip 1 strip by Other route 2 (two) times daily.     metFORMIN (GLUCOPHAGE) 1000 MG tablet Take 1 tablet (1,000 mg total) by mouth 2 (two) times daily with a meal. 180 tablet 1   metoCLOPramide (REGLAN) 5 MG tablet TAKE 1 TABLET BY MOUTH THREE TIMES A DAY 270 tablet 1   Multiple Vitamins-Minerals (MULTIVITAMIN WITH MINERALS) tablet Take 1 tablet by mouth daily.     NOVOLIN N RELION 100 UNIT/ML injection INJECT 28 UNITS SUBCUTANEOUSLY TWO TIMES DAILY.     NOVOLIN R RELION 100 UNIT/ML injection INJECT 20 UNITS SUBCUTANEOUSLY THREE TIMES DAILY BEFORE MEAL(S) (ADD SLIDING SCALE AS NECESSARY)     Olmesartan-amLODIPine-HCTZ 40-5-25 MG TABS Take 1 tablet by mouth daily. 90 tablet 1   Oxycodone HCl 20 MG TABS LIMIT 1/2 TO 1 TABLET BY MOUTH 3 TO 5 TIMES PER DAY IF TOLERATED *NOTE TABLET IS TWICE AS STRONG*     polyethylene glycol (MIRALAX / GLYCOLAX) packet Take 17 g by mouth daily. 14 each 0   pravastatin (PRAVACHOL) 40 MG tablet Take 1 tablet (40 mg total) by mouth daily. 90 tablet 1   venlafaxine XR (EFFEXOR-XR) 150 MG 24 hr capsule Take 1 capsule (150 mg total) by mouth daily with breakfast. 90 capsule 1   No current facility-administered medications for this visit.    Review of Systems General: negative for, fevers, chills, fatigue, changes in sleep, changes in weight or appetite Skin: negative for changes in color, texture, moles or lesions Eyes: negative  for, changes in vision, pain, diplopia HEENT: negative for, change in hearing, pain, discharge, tinnitus, vertigo, voice changes, sore throat, neck masses Pulmonary: negative for, dyspnea, orthopnea, productive cough Cardiac: negative for, palpitations, syncope, pain, discomfort, pressure Gastrointestinal: negative for, dysphagia, nausea, vomiting, jaundice, pain, constipation, diarrhea, hematemesis, hematochezia Genitourinary/Sexual: negative for, dysuria, discharge, hesitancy, nocturia, retention, stones, infections, STD's, incontinence Ob/Gyn: negative for, irregular bleeding, pain Musculoskeletal: hip joints and SI joints with DJD and decreased mobility. Hematology: negative for, easy bruising, bleeding Neurologic/Psych: negative for, headaches, seizures, paralysis, weakness, tremor, change in gait, change in sensation, mood swings, depression, anxiety, change in memory  Objective:  Physical Examination:  BP (!) 142/58   Pulse 83   Temp (!) 96.7 F (35.9 C)   Resp 19   Wt 193 lb 6.4 oz (87.7 kg)   SpO2 98%   BMI 36.54 kg/m    ECOG Performance Status: 1 - Symptomatic but completely ambulatory  General appearance: alert, cooperative, and appears stated age HEENT:PERRLA, neck supple with midline trachea, and thyroid without masses Lymph node survey: non-palpable, axillary, inguinal, supraclavicular Cardiovascular: regular rate and rhythm, no murmurs or gallops Respiratory: normal air entry, lungs clear to auscultation. Abdomen: soft, non-tender, without masses or organomegaly and no hernias Back: inspection of back is normal Extremities: extremities normal, atraumatic, no cyanosis or edema Skin exam - normal coloration and turgor, no rashes, no suspicious skin lesions noted. Neurological exam reveals alert, oriented, normal speech, no focal findings or movement disorder noted.  Pelvic: exam chaperoned by nurse;  Vulva: about 3.5 cm superficial raised dark lesion on upper right  labial and approaching clitoris, no necrosis, bleeding or induration; Vagina: normal vagina, some discharge; Adnexa: normal adnexa in size, nontender and no masses; Uterus: uterus is normal size, shape, consistency and nontender; Cervix: anteverted; Rectal: not indicated Groins negative for palpable nodes.  Lab Review Lab Results  Component Value Date   WBC 7.4 06/28/2019   HGB 12.7 06/28/2019   HCT 38.5 06/28/2019   MCV 89.3 06/28/2019   PLT 325 06/28/2019     Chemistry      Component Value Date/Time   NA 141 12/12/2021 1121   NA 137 02/21/2016 0000   K 4.6 12/12/2021 1121   CL 102 12/12/2021 1121   CO2 28 12/12/2021 1121   BUN 25 12/12/2021 1121   BUN 9 02/21/2016 0000   CREATININE 2.24 (H) 12/12/2021 1121   GLU 344 06/12/2017 0000      Component Value Date/Time   CALCIUM 9.3 12/12/2021 1121   ALKPHOS 95 12/23/2016 0933   AST 15 12/12/2021 1121   ALT 14 12/12/2021 1121   BILITOT 0.2 12/12/2021 1121   BILITOT <0.2 05/12/2015 1010        Assessment:  ALYAH BOEHNING is a 71 y.o. 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  It is near clitoris, but not urethra.  Feels like a superficial lesion, although fairly wide including some areas of VIN.  Marland Kitchen    Medical co-morbidities complicating care: HTN, AODM/insulin, SI and hip joint DJD with reduced mobility and uses cane, BMI 36.  Plan:   Problem List Items Addressed This Visit       Genitourinary   Vulvar cancer ([place]) - Primary    I discussed options for management with patient and her daughter including surgery with right hemi-vulvectomy and bilateral sentinel lymph node mapping and biopsies.  Suggested that this surgery would be best performed at [facility] and they were in agreement wit proceeding.  We will arrange for an appointment next week.  Discussed that the lesion was close to the clitoris, but that this structure could likely be preserved.    Wet prep sent due to discharge.   The  patient's diagnosis, an outline of the further diagnostic and laboratory studies which will be required, the recommendation, and alternatives were discussed.  All questions were answered to the patient's satisfaction.  Mellody Drown, MD  CC:  Steele Sizer, [place] [address] [employer] [place],  [place] [address] [phone]

## 2022-08-07 NOTE — Telephone Encounter (Signed)
Called results of wet prep to Lorraine Jones. Diflucan sent to pharmacy. Educated on [place].

## 2022-08-07 NOTE — Progress Notes (Signed)
Patient presents today to discuss recent biopsy results. She states she is no longer bleeding but is having a burning pain along with itching near lesion. No additional concerns other than results at this time.

## 2022-08-13 ENCOUNTER — Encounter: Payer: Medicare Other | Admitting: Obstetrics and Gynecology

## 2022-08-16 LAB — HM DIABETES EYE EXAM

## 2022-08-21 ENCOUNTER — Ambulatory Visit
Admission: RE | Admit: 2022-08-21 | Discharge: 2022-08-21 | Disposition: A | Discharge status: Home / Self Care | Payer: Medicare Other | Source: Ambulatory Visit | Attending: Family Medicine | Admitting: Family Medicine

## 2022-08-21 DIAGNOSIS — M8580 Other specified disorders of bone density and structure, unspecified site: Secondary | ICD-10-CM | POA: Insufficient documentation

## 2022-08-21 DIAGNOSIS — Z780 Asymptomatic menopausal state: Secondary | ICD-10-CM | POA: Diagnosis present

## 2022-08-21 DIAGNOSIS — Z1231 Encounter for screening mammogram for malignant neoplasm of breast: Secondary | ICD-10-CM | POA: Insufficient documentation

## 2022-08-29 ENCOUNTER — Ambulatory Visit (INDEPENDENT_AMBULATORY_CARE_PROVIDER_SITE_OTHER): Payer: Medicare Other

## 2022-08-29 VITALS — Ht 61.0 in | Wt 193.0 lb

## 2022-08-29 DIAGNOSIS — Z0000 Encounter for general adult medical examination without abnormal findings: Secondary | ICD-10-CM

## 2022-08-29 NOTE — Progress Notes (Signed)
Subjective:  I connected with  Lorraine Jones on 08/29/22 by a audio enabled telemedicine application and verified that I am speaking with the correct person using two identifiers.  Patient Location: Home  Provider Location: Office/Clinic  I discussed the limitations of evaluation and management by telemedicine. The patient expressed understanding and agreed to proceed.  Lorraine Jones is a 71 y.o. female who presents for Medicare Annual (Subsequent) preventive examination.  Review of Systems    Defer to PCP       Objective:    There were no vitals filed for this visit. There is no height or weight on file to calculate BMI.     08/07/2022   10:40 AM 08/28/2021    8:17 AM 07/11/2020    8:30 AM 12/01/2018   10:44 AM 05/16/2017    7:58 AM 04/04/2017   11:48 AM 12/23/2016    8:49 AM  Advanced Directives  Does Patient Have a Medical Advance Directive? _0  No No  Does patient want to make changes to medical advance directive? No - Patient declined        Would patient like information on creating a medical advance directive? No - Patient declined Yes (MAU/Ambulatory/Procedural Areas - Information given) Yes (MAU/Ambulatory/Procedural Areas - Information given) Yes (MAU/Ambulatory/Procedural Areas - Information given)       Current Medications (verified) Outpatient Encounter Medications as of 08/29/2022  Medication Sig   aspirin EC 81 MG tablet Take 81 mg by mouth daily.   Blood Glucose Monitoring Suppl (ONETOUCH VERIO) w/Device KIT    carvedilol (COREG) 3.125 MG tablet TAKE 1 TABLET BY MOUTH TWICE A DAY   dorzolamide-timolol (COSOPT) 22.3-6.8 MG/ML ophthalmic solution Place 1 drop into both eyes 2 (two) times daily.   fentaNYL (DURAGESIC - DOSED MCG/HR) 75 MCG/HR Apply 2 patches to skin every 2 days if tolerated.   NOTE: Do not apply 100 g per hour fentanyl patch since insurance will not approve 100 g patch for you   glucose blood test strip 1 strip by Other route 2 (two)  times daily.   metFORMIN (GLUCOPHAGE) 1000 MG tablet Take 1 tablet (1,000 mg total) by mouth 2 (two) times daily with a meal.   metoCLOPramide (REGLAN) 5 MG tablet TAKE 1 TABLET BY MOUTH THREE TIMES A DAY   Multiple Vitamins-Minerals (MULTIVITAMIN WITH MINERALS) tablet Take 1 tablet by mouth daily.   NOVOLIN N RELION 100 UNIT/ML injection INJECT 28 UNITS SUBCUTANEOUSLY TWO TIMES DAILY.   NOVOLIN R RELION 100 UNIT/ML injection INJECT 20 UNITS SUBCUTANEOUSLY THREE TIMES DAILY BEFORE MEAL(S) (ADD SLIDING SCALE AS NECESSARY)   Olmesartan-amLODIPine-HCTZ 40-5-25 MG TABS Take 1 tablet by mouth daily.   Oxycodone HCl 20 MG TABS LIMIT 1/2 TO 1 TABLET BY MOUTH 3 TO 5 TIMES PER DAY IF TOLERATED *NOTE TABLET IS TWICE AS STRONG*   polyethylene glycol (MIRALAX / GLYCOLAX) packet Take 17 g by mouth daily.   pravastatin (PRAVACHOL) 40 MG tablet Take 1 tablet (40 mg total) by mouth daily.   venlafaxine XR (EFFEXOR-XR) 150 MG 24 hr capsule Take 1 capsule (150 mg total) by mouth daily with breakfast.   No facility-administered encounter medications on file as of 08/29/2022.    Allergies (verified) Diclofenac sodium, Naproxen sodium, Etodolac, Gabapentin, and Naproxen   History: Past Medical History:  Diagnosis Date   Arthritis    Chronic low back pain    Depression    Diabetes mellitus    Hyperlipidemia  Hypertension    Past Surgical History:  Procedure Laterality Date   CHOLECYSTECTOMY  1996   CYSTOSCOPY WITH URETEROSCOPY Right 09/01/2013   Procedure: CYSTOSCOPY WITH UNROOFING  RIGHT URETEROCELE AND URETERAL STONE REMOVED  WITH GYRUS COLLINS KNIFE. ;  Surgeon: Irine Seal, MD;  Location: [facility];  Service: Urology;  Laterality: Right;  cysto, right uretersoscopy and stone extraction   collins knife   LUMBAR DISC SURGERY  1993   L4  --  L5   Family History  Problem Relation Age of Onset   Diabetes Mother    Heart disease Mother    Hypertension Mother    Breast cancer Neg  Hx    Social History   Socioeconomic History   Marital status: Married    Spouse name: Not on file   Number of children: 3   Years of education: Not on file   Highest education level: 12th grade  Occupational History   Occupation: retired  Tobacco Use   Smoking status: Every Day    Packs/day: 0.50    Years: 20.00    Total pack years: 10.00    Types: Cigarettes   Smokeless tobacco: Never   Tobacco comments:    she is cutting down   Vaping Use   Vaping Use: Never used  Substance and Sexual Activity   Alcohol use: No    Alcohol/week: 0.0 standard drinks of alcohol   Drug use: No   Sexual activity: Not Currently    Partners: Male    Birth control/protection: Post-menopausal  Other Topics Concern   Not on file  Social History Narrative   Not on file   Social Determinants of Health   Financial Resource Strain: Low Risk  (08/28/2021)   Overall Financial Resource Strain (CARDIA)    Difficulty of Paying Living Expenses: Not very hard  Food Insecurity: No Food Insecurity (08/28/2021)   Hunger Vital Sign    Worried About Running Out of Food in the Last Year: Never true    Ran Out of Food in the Last Year: Never true  Transportation Needs: No Transportation Needs (08/28/2021)   PRAPARE - Hydrologist (Medical): No    Lack of Transportation (Non-Medical): No  Physical Activity: Inactive (08/28/2021)   Exercise Vital Sign    Days of Exercise per Week: 0 days    Minutes of Exercise per Session: 0 min  Stress: No Stress Concern Present (08/28/2021)   [place]    Feeling of Stress : Not at all  Social Connections: Moderately Integrated (08/28/2021)   Social Connection and Isolation Panel [NHANES]    Frequency of Communication with Friends and Family: More than three times a week    Frequency of Social Gatherings with Friends and Family: Three times a week    Attends Religious  Services: More than 4 times per year    Active Member of Clubs or Organizations: No    Attends Archivist Meetings: Never    Marital Status: Married    Tobacco Counseling Ready to quit: Not Answered Counseling given: Not Answered Tobacco comments: she is cutting down    Clinical Intake:                 Diabetic?Yes Nutrition Risk Assessment:  Has the patient had any N/V/D within the last 2 months?  No  Does the patient have any non-healing wounds?  No  Has the patient had any  unintentional weight loss or weight gain?  No   Diabetes:  Is the patient diabetic?  Yes  If diabetic, was a CBG obtained today?  No  Did the patient bring in their glucometer from home?  No  How often do you monitor your CBG's? Daily.   Financial Strains and Diabetes Management:  Are you having any financial strains with the device, your supplies or your medication? No .  Does the patient want to be seen by Chronic Care Management for management of their diabetes?  No  Would the patient like to be referred to a Nutritionist or for Diabetic Management?  No   Diabetic Exams:  Diabetic Eye Exam: Completed 08/16/2022 Diabetic Foot Exam: Overdue, Pt has been advised about the importance in completing this exam. Pt is scheduled for diabetic foot exam on n/a.          Activities of Daily Living    07/17/2022    8:29 AM 05/21/2022    9:36 AM  In your present state of health, do you have any difficulty performing the following activities:  Hearing? 0 1  Vision? 0 0  Difficulty concentrating or making decisions? 0 0  Walking or climbing stairs? 1 1  Dressing or bathing? 0 0  Doing errands, shopping? 0 0    Patient Care Team: Steele Sizer, MD as PCP - General (Family Medicine) Lonia Farber, MD as Consulting Physician (Internal Medicine) Mohammed Kindle, MD as Referring Physician (Pain Medicine) Vladimir Crofts, MD as Consulting Physician (Neurology) Clent Jacks, RN as Oncology Nurse Navigator  Indicate any recent Medical Services you may have received from other than [facility] providers in the past year (date may be approximate).     Assessment:   This is a routine wellness examination for [facility].  Hearing/Vision screen No results found.  Dietary issues and exercise activities discussed:     Goals Addressed   None   Depression Screen    07/17/2022    8:29 AM 05/21/2022    9:36 AM 12/12/2021   10:15 AM 08/28/2021    8:15 AM 07/17/2021   10:06 AM 03/26/2021    8:49 AM 11/14/2020    8:39 AM  PHQ 2/9 Scores  PHQ - 2 Score 0 0 0 _0 PHQ- 9 Score 0 0 0 _1 Fall Risk    07/17/2022    8:28 AM 05/21/2022    9:36 AM 12/12/2021   10:15 AM 08/28/2021    8:17 AM 07/17/2021   10:06 AM  Fall Risk   Falls in the past year? 0 0 0 1 0  Number falls in past yr: 0 0 0 0 0  Injury with Fall? 0 0 0 0 0  Risk for fall due to :  Impaired balance/gait No Fall Risks No Fall Risks Impaired balance/gait  Follow up Falls evaluation completed Falls prevention discussed Falls prevention discussed Falls prevention discussed Falls prevention discussed    FALL RISK PREVENTION PERTAINING TO THE HOME:  Any stairs in or around the home? No  If so, are there any without handrails? No  Home free of loose throw rugs in walkways, pet beds, electrical cords, etc? Yes  Adequate lighting in your home to reduce risk of falls? Yes   ASSISTIVE DEVICES UTILIZED TO PREVENT FALLS:  Life alert? No  Use of a cane, walker or w/c? No  Grab bars in the bathroom? Yes  Shower chair or bench  in shower? Yes  Elevated toilet seat or a handicapped toilet? Yes   TIMED UP AND GO:  Was the test performed? No .  Length of time to ambulate 10 feet: n/a sec.     Cognitive Function:        07/11/2020    8:32 AM 12/01/2018   10:47 AM  6CIT Screen  What Year? 0 points 0 points  What month? 0 points 0 points  What time? 0 points 0 points  Count back from 20 0  points 0 points  Months in reverse 0 points 0 points  Repeat phrase 6 points 0 points  Total Score 6 points 0 points    Immunizations Immunization History  Administered Date(s) Administered   Fluad Quad(high Dose 65+) 06/28/2019, 07/11/2020, 07/29/2022   Influenza, High Dose Seasonal PF 06/25/2016, 06/30/2017, 12/01/2018, 07/17/2021   Influenza,inj,Quad PF,6+ Mos 06/19/2015   Influenza-Unspecified 06/08/2014   PFIZER(Purple Top)SARS-COV-2 Vaccination 11/24/2019, 12/15/2019, 08/17/2020   Pneumococcal Conjugate-13 03/04/2016   Pneumococcal Polysaccharide-23 06/02/2014, 06/28/2019   Tdap 06/28/2008    TDAP status: Due, Education has been provided regarding the importance of this vaccine. Advised may receive this vaccine at local pharmacy or Health Dept. Aware to provide a copy of the vaccination record if obtained from local pharmacy or Health Dept. Verbalized acceptance and understanding.  Flu Vaccine status: Up to date  Pneumococcal vaccine status: Up to date  Covid-19 vaccine status: Completed vaccines  Qualifies for Shingles Vaccine? Yes   Zostavax completed No   Shingrix Completed?: No.    Education has been provided regarding the importance of this vaccine. Patient has been advised to call insurance company to determine out of pocket expense if they have not yet received this vaccine. Advised may also receive vaccine at local pharmacy or Health Dept. Verbalized acceptance and understanding.  Screening Tests Health Maintenance  Topic Date Due   Zoster Vaccines- Shingrix (1 of 2) Never done   DTaP/Tdap/Td (2 - Td or Tdap) 06/28/2018   COVID-19 Vaccine (4 - 2023-24 season) 05/31/2022   FOOT EXAM  07/17/2022   OPHTHALMOLOGY EXAM  08/02/2022   HEMOGLOBIN A1C  09/04/2022   Diabetic kidney evaluation - GFR measurement  12/13/2022   Diabetic kidney evaluation - Urine ACR  03/06/2023   Medicare Annual Wellness (AWV)  08/30/2023   Fecal DNA (Cologuard)  04/11/2024   MAMMOGRAM   08/21/2024   Pneumonia Vaccine 33+ Years old  Completed   INFLUENZA VACCINE  Completed   DEXA SCAN  Completed   Hepatitis C Screening  Completed   HPV VACCINES  Aged Out   COLONOSCOPY (Pts 45-73yr Insurance coverage will need to be confirmed)  [place] Due  Topic Date Due   Zoster Vaccines- Shingrix (1 of 2) Never done   DTaP/Tdap/Td (2 - Td or Tdap) 06/28/2018   COVID-19 Vaccine (4 - 2023-24 season) 05/31/2022   FOOT EXAM  07/17/2022   OPHTHALMOLOGY EXAM  08/02/2022    Colorectal cancer screening: No longer required.   Mammogram status: Completed 08/21/2022. Repeat every year  Bone Density status: Completed 08/21/2022. Results reflect: Bone density results: NORMAL. Repeat every 2 years.  Lung Cancer Screening: (Low Dose CT Chest recommended if Age 71-80years, 30 pack-year currently smoking OR have quit w/in 15years.) does not qualify.   Lung Cancer Screening Referral: n/a  Additional Screening:  Hepatitis C Screening: does not qualify; Completed 04/15/2013  Vision Screening: Recommended annual ophthalmology exams for early detection of glaucoma  and other disorders of the eye. Is the patient up to date with their annual eye exam?  Yes  Who is the provider or what is the name of the office in which the patient attends annual eye exams? [facility] If pt is not established with a provider, would they like to be referred to a provider to establish care? No .   Dental Screening: Recommended annual dental exams for proper oral hygiene  Community Resource Referral / Chronic Care Management: CRR required this visit?  No   CCM required this visit?  No      Plan:     I have personally reviewed and noted the following in the patient's chart:   Medical and social history Use of alcohol, tobacco or illicit drugs  Current medications and supplements including opioid prescriptions. Patient is not currently taking opioid  prescriptions. Functional ability and status Nutritional status Physical activity Advanced directives List of other physicians Hospitalizations, surgeries, and ER visits in previous 12 months Vitals Screenings to include cognitive, depression, and falls Referrals and appointments  In addition, I have reviewed and discussed with patient certain preventive protocols, quality metrics, and best practice recommendations. A written personalized care plan for preventive services as well as general preventive health recommendations were provided to patient.     Royal Hawthorn, [place]   08/29/2022   Nurse Notes:  Ms. Halbleib , Thank you for taking time to come for your Medicare Wellness Visit. I appreciate your ongoing commitment to your health goals. Please review the following plan we discussed and let me know if I can assist you in the future.   These are the goals we discussed:  Goals      DIET - INCREASE WATER INTAKE     Recommend drinking 6-8 glasses of water per day     Quit Smoking     If you wish to quit smoking, help is available. For free tobacco cessation program offerings call the [facility] at 816-528-1816 or Live Well Line at 501-082-1442. You may also visit [email or web address] or email [email or web address] for more information on other programs.          This is a list of the screening recommended for you and due dates:  Health Maintenance  Topic Date Due   Zoster (Shingles) Vaccine (1 of 2) Never done   DTaP/Tdap/Td vaccine (2 - Td or Tdap) 06/28/2018   COVID-19 Vaccine (4 - 2023-24 season) 05/31/2022   Complete foot exam   07/17/2022   Eye exam for diabetics  08/02/2022   Hemoglobin A1C  09/04/2022   Yearly kidney function blood test for diabetes  12/13/2022   Yearly kidney health urinalysis for diabetes  03/06/2023   Medicare Annual Wellness Visit  08/30/2023   Cologuard (Stool DNA test)  04/11/2024   Mammogram  08/21/2024   Pneumonia  Vaccine  Completed   Flu Shot  Completed   DEXA scan (bone density measurement)  Completed   Hepatitis C Screening: USPSTF Recommendation to screen - Ages 9-79 yo.  Completed   HPV Vaccine  Aged Out   Colon Cancer Screening  Discontinued

## 2022-08-30 LAB — CBC AND DIFFERENTIAL
HCT: 42 (ref 36–46)
Hemoglobin: 13.5 (ref 12.0–16.0)
Platelets: 289 10*3/uL (ref 150–400)
WBC: 9.4

## 2022-08-30 LAB — CBC: RBC: 4.45 (ref 3.87–5.11)

## 2022-08-30 LAB — HEMOGLOBIN A1C: Hemoglobin A1C: 8.1

## 2022-08-30 LAB — COMPREHENSIVE METABOLIC PANEL
Albumin: 4.1 (ref 3.5–5.0)
Calcium: 9.5 (ref 8.7–10.7)
eGFR: 54

## 2022-08-30 LAB — HEPATIC FUNCTION PANEL
ALT: 23 U/L (ref 7–35)
AST: 27 (ref 13–35)
Alkaline Phosphatase: 94 (ref 25–125)
Bilirubin, Total: 0.7

## 2022-08-30 LAB — BASIC METABOLIC PANEL
BUN: 14 (ref 4–21)
CO2: 29 — AB (ref 13–22)
Chloride: 104 (ref 99–108)
Creatinine: 1.1 (ref 0.5–1.1)
Glucose: 58
Potassium: 3.9 mEq/L (ref 3.5–5.1)
Sodium: 142 (ref 137–147)

## 2022-09-04 ENCOUNTER — Telehealth: Payer: Self-pay

## 2022-09-04 NOTE — Telephone Encounter (Signed)
Called and left voicemail with Ms Arceneaux providing her post operative appointment with Dr Fransisca Connors. 12/27 at 1400.

## 2022-09-11 HISTORY — PX: VULVECTOMY PARTIAL: SHX6187

## 2022-09-24 ENCOUNTER — Telehealth: Payer: Self-pay | Admitting: *Deleted

## 2022-09-24 NOTE — Telephone Encounter (Addendum)
Patient and her daughter called cancer center. Pt s/p radical vulvectomy and bilateral inguinal sentinel lymph node mapping surgery. Daughter called cancer center to get a stat apt with Dr. Bradd Canary or Ander Purpura, NP. I explained to daughter that neither of these gyn oncology providers are here today or tomorrow. Daughter states pt has a 'hard swollen area on the vagina.' She reports that there is some clear drainage that has an odor, but no purlent discharge. Pt denies any fevers. Pt is concerned that there may be wound breakdown s/p surgery. No bleeding present at wound. No signs of red/streaking to wound. They have not used any ice pack to help with the swelling of the vagina. They do not have any ice packs available. Given that this is a new surgery/ post op patient and risk for infection, pt was asked to call [facility] to see if she can obtain an apt asap there with either Dr. Valetta Mole fellow/colleagues. I explained to daughter that gyn oncology mds are back in the clinic next Wednesday 1/3 but she needs to get an apt this week at [facility]. Pt/family gave verbal understanding and agreed with plan of care. Pt/family given contact number for apts at [facility].  Discussed the above plan with Judson Roch, [place]- who agrees with RN that pt needs f/u at [facility] at this time.

## 2022-09-25 ENCOUNTER — Ambulatory Visit: Payer: Medicare Other

## 2022-09-27 ENCOUNTER — Ambulatory Visit: Payer: Self-pay

## 2022-09-27 ENCOUNTER — Encounter: Payer: Self-pay | Admitting: Family Medicine

## 2022-09-27 ENCOUNTER — Ambulatory Visit (INDEPENDENT_AMBULATORY_CARE_PROVIDER_SITE_OTHER): Payer: Medicare Other | Admitting: Family Medicine

## 2022-09-27 VITALS — Ht 61.0 in | Wt 188.0 lb

## 2022-09-27 DIAGNOSIS — J069 Acute upper respiratory infection, unspecified: Secondary | ICD-10-CM

## 2022-09-27 DIAGNOSIS — Z20828 Contact with and (suspected) exposure to other viral communicable diseases: Secondary | ICD-10-CM

## 2022-09-27 MED ORDER — BENZONATATE 100 MG PO CAPS: 100.0000 mg | ORAL_CAPSULE | Freq: Three times a day (TID) | ORAL | 0 refills | Status: DC | PRN

## 2022-09-27 MED ORDER — CETIRIZINE HCL 10 MG PO TABS: 10.0000 mg | ORAL_TABLET | Freq: Every day | ORAL | 11 refills | Status: AC

## 2022-09-27 MED ORDER — OSELTAMIVIR PHOSPHATE 75 MG PO CAPS: 75.0000 mg | ORAL_CAPSULE | Freq: Two times a day (BID) | ORAL | 0 refills | Status: AC

## 2022-09-27 NOTE — Telephone Encounter (Signed)
  Chief Complaint: flu exposure  Symptoms: cough and congestion, body aches  Frequency: yesterday Pertinent Negatives: Patient denies SOB or fever Disposition: '[]'$ ED /'[]'$ Urgent Care (no appt availability in office) / '[x]'$ Appointment(In office/virtual)/ '[]'$  [facility]/ '[]'$ Home Care/ '[]'$ Refused Recommended Disposition /'[]'$ [facility]/ '[]'$  Follow-up with PCP Additional Notes: pt's grandson tested positive today for Flu, symptoms started yesterday. Pt lives with grandson and daughter. Scheduled telephone visit today at 1320 with [place], [place]. Care advice given and pt verbalized understanding.  Summary: Pt requests Rx for Tamiflu   Pt daughter stated pt has been staying with her and her son. Pt daughter reports that her son just tested positive for influenza and the pediatrician that saw him requested that everyone contact their pcp to request Rx for Tamiflu. Pt daughter stated pt is already showing signs and requests that the Rx be sent to [place], [place] Phone: [phone]  Fax: [phone]         Reason for Disposition  [1] Influenza EXPOSURE (Close Contact) within last 48 hours (2 days) AND [2] exposed person is HIGH RISK (e.g., age > 61 years, pregnant, HIV+, chronic medical condition)  Answer Assessment - Initial Assessment Questions 1. TYPE of EXPOSURE: "How were you exposed?" (e.g., close contact, not a close contact)     grandson 2. DATE of EXPOSURE: "When did the exposure occur?" (e.g., hour, days, weeks)     Yesterday  4. HIGH RISK for COMPLICATIONS: "Do you have any heart or lung problems?" "Do you have a weakened immune system?" (e.g., CHF, COPD, asthma, HIV positive, chemotherapy, renal failure, diabetes mellitus, sickle cell anemia)     DM 5. SYMPTOMS: "Do you have any symptoms?" (e.g., cough, fever, sore throat, difficulty breathing).     Cough and nasal congestion, body  aches  Protocols used: Influenza (Flu) [place]

## 2022-09-27 NOTE — Progress Notes (Signed)
Name: Lorraine Jones   MRN: [id number]    DOB: 02-24-1951   Date:09/27/2022       Progress Note  Subjective:    Chief Complaint  Chief Complaint  Patient presents with   Cough    Started yesterday, non-productive   Nasal Drainage    I connected with  Kristine Royal Garrott on 09/27/22 at  1:20 PM EST by telephone and verified that I am speaking with the correct person using two identifiers.   I discussed the limitations, risks, security and privacy concerns of performing an evaluation and management service by telephone and the availability of in person appointments. Staff also discussed with the patient that there may be a patient responsible charge related to this service.  Patient verbalized understanding and agreed to proceed with encounter. Patient Location: home Provider Location: [facility] clinic Additional Individuals present: none  Cough   Pt presents via telephone for new onset yesterday of nasal drainage (small) and cough is dry, denies associated CP, SOB, wheeze, chest tightness She has no weakness, sweats, fatigue, HA, fever, chills She reports exposure to multiple people for christmas that she doesn't know what they have And several family members in the home right now with Flu A She denies any hx of asthma or COPD She cannot come by the office for any testing and she has not done any home cOVID testing     Patient Active Problem List   Diagnosis Date Noted   Vulvar cancer ([place]) 08/07/2022   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 2 diabetes mellitus ([place]) 04/06/2019   Primary osteoarthritis of both hips 02/06/2018   Hypertension associated with diabetes ([place]) 07/23/2017   Hyperlipidemia due to type 2 diabetes mellitus ([place]) 07/23/2017   Major depression, recurrent ([place]) 06/25/2016   Osteopenia 04/18/2016   Gastroesophageal reflux disease without esophagitis 05/12/2015   Chronic pain 05/12/2015   Hyperlipidemia  05/12/2015   Lumbar post-laminectomy syndrome 02/27/2015   Neuropathy due to secondary diabetes ([place]) 02/27/2015   Spinal stenosis, lumbar region, with neurogenic claudication 02/27/2015   Sacroiliac joint dysfunction of both sides 02/27/2015   Degenerative joint disease (DJD) of hip 02/27/2015   DDD (degenerative disc disease), cervical 02/27/2015   Bilateral occipital neuralgia 02/27/2015   Chronic constipation 06/02/2014   Hypertension, benign 06/01/2014    Social History   Tobacco Use   Smoking status: Every Day    Packs/day: 0.50    Years: 20.00    Total pack years: 10.00    Types: Cigarettes   Smokeless tobacco: Never   Tobacco comments:    she is cutting down   Substance Use Topics   Alcohol use: No    Alcohol/week: 0.0 standard drinks of alcohol     Current Outpatient Medications:    aspirin EC 81 MG tablet, Take 81 mg by mouth daily., Disp: , Rfl:    Blood Glucose Monitoring Suppl (ONETOUCH VERIO) w/Device KIT, , Disp: , Rfl:    carvedilol (COREG) 3.125 MG tablet, TAKE 1 TABLET BY MOUTH TWICE A DAY, Disp: 180 tablet, Rfl: 1   dorzolamide-timolol (COSOPT) 22.3-6.8 MG/ML ophthalmic solution, Place 1 drop into both eyes 2 (two) times daily., Disp: , Rfl:    fentaNYL (DURAGESIC - DOSED MCG/HR) 75 MCG/HR, Apply 2 patches to skin every 2 days if tolerated.   NOTE: Do not apply 100 g per hour fentanyl patch since insurance will not approve 100 g patch for you, Disp: 30 patch, Rfl:  0   glucose blood test strip, 1 strip by Other route 2 (two) times daily., Disp: , Rfl:    metFORMIN (GLUCOPHAGE) 1000 MG tablet, Take 1 tablet (1,000 mg total) by mouth 2 (two) times daily with a meal., Disp: 180 tablet, Rfl: 1   metoCLOPramide (REGLAN) 5 MG tablet, TAKE 1 TABLET BY MOUTH THREE TIMES A DAY, Disp: 270 tablet, Rfl: 1   Multiple Vitamins-Minerals (MULTIVITAMIN WITH MINERALS) tablet, Take 1 tablet by mouth daily., Disp: , Rfl:    NOVOLIN N RELION 100 UNIT/ML injection, INJECT 28  UNITS SUBCUTANEOUSLY TWO TIMES DAILY., Disp: , Rfl:    NOVOLIN R RELION 100 UNIT/ML injection, INJECT 20 UNITS SUBCUTANEOUSLY THREE TIMES DAILY BEFORE MEAL(S) (ADD SLIDING SCALE AS NECESSARY), Disp: , Rfl:    Olmesartan-amLODIPine-HCTZ 40-5-25 MG TABS, Take 1 tablet by mouth daily., Disp: 90 tablet, Rfl: 1   Oxycodone HCl 20 MG TABS, LIMIT 1/2 TO 1 TABLET BY MOUTH 3 TO 5 TIMES PER DAY IF TOLERATED *NOTE TABLET IS TWICE AS STRONG*, Disp: , Rfl:    polyethylene glycol (MIRALAX / GLYCOLAX) packet, Take 17 g by mouth daily., Disp: 14 each, Rfl: 0   pravastatin (PRAVACHOL) 40 MG tablet, Take 1 tablet (40 mg total) by mouth daily., Disp: 90 tablet, Rfl: 1   venlafaxine XR (EFFEXOR-XR) 150 MG 24 hr capsule, Take 1 capsule (150 mg total) by mouth daily with breakfast., Disp: 90 capsule, Rfl: 1  Allergies  Allergen Reactions   Diclofenac Sodium Swelling   Naproxen Sodium Swelling   Etodolac Swelling   Gabapentin Swelling   Naproxen Swelling    Chart Review: I personally reviewed active problem list, medication list, allergies, family history, social history, health maintenance, notes from last encounter, lab results, imaging with the patient/caregiver today.   Review of Systems  Constitutional: Negative.   HENT: Negative.    Eyes: Negative.   Respiratory:  Positive for cough.   Cardiovascular: Negative.   Gastrointestinal: Negative.   Endocrine: Negative.   Genitourinary: Negative.   Musculoskeletal: Negative.   Skin: Negative.   Allergic/Immunologic: Negative.   Neurological: Negative.   Hematological: Negative.   Psychiatric/Behavioral: Negative.    All other systems reviewed and are negative.    Objective:    Virtual encounter, vitals limited, only able to obtain the following Today's Vitals   09/27/22 1219  Weight: 188 lb (85.3 kg)  Height: 5' 1" (1.549 m)   Body mass index is 35.52 kg/m. Nursing Note and Vital Signs reviewed.  Physical Exam Vitals and nursing note  reviewed.  Constitutional:      Comments: Normal phonation No audible wheeze, stridor or coughing during visit Able to speak in full and complete sentences, answers questions appropriately   Neurological:     Mental Status: She is alert.     PE limited by telephone encounter  No results found for this or any previous visit (from the past 72 hour(s)).  Assessment and Plan:     ICD-10-CM   1. Upper respiratory tract infection, unspecified type  J06.9 benzonatate (TESSALON) 100 MG capsule    cetirizine (ZYRTEC) 10 MG tablet    2. Exposure to influenza  Z20.828 oseltamivir (TAMIFLU) 75 MG capsule    Encouraged pt to go to nearby UC for testing or for eval if she is getting worse. Her sx sound mild right now - runny nose and mild dry cough w/o constitutional sx, I doubt she has Flu A right now, but she is in the home with  several people with flu - encouraged her to get and start tamiflu if she has onset of body aches/fever/chills etc. She can try symptomatic and supportive measures    -Red flags and when to present for emergency care or RTC including but not limited to new/worsening/un-resolving symptoms,  reviewed with patient at time of visit. Follow up and care instructions discussed and provided in AVS. - I discussed the assessment and treatment plan with the patient. The patient was provided an opportunity to ask questions and all were answered. The patient agreed with the plan and demonstrated an understanding of the instructions.  - The patient was advised to call back or seek an in-person evaluation if the symptoms worsen or if the condition fails to improve as anticipated.  I provided 15 minutes of non-face-to-face time during this encounter.  Delsa Grana, PA-C 09/27/22 1:32 PM

## 2022-10-05 DIAGNOSIS — E1065 Type 1 diabetes mellitus with hyperglycemia: Secondary | ICD-10-CM | POA: Diagnosis not present

## 2022-10-09 ENCOUNTER — Inpatient Hospital Stay: Payer: Medicare Other | Attending: Obstetrics and Gynecology | Admitting: Obstetrics and Gynecology

## 2022-10-09 VITALS — BP 165/62 | HR 81 | Temp 96.9°F | Resp 19 | Wt 179.1 lb

## 2022-10-09 DIAGNOSIS — C519 Malignant neoplasm of vulva, unspecified: Secondary | ICD-10-CM

## 2022-10-09 DIAGNOSIS — Z9079 Acquired absence of other genital organ(s): Secondary | ICD-10-CM

## 2022-10-09 NOTE — Progress Notes (Signed)
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  Subjective:  Lorraine Jones is a 72 y.o. female who is seen in consultation from Dr. Amalia Hailey for vulvar cancer.   09/03/22 - Underwent right hemi-vulvectomy and bilateral sentinel lymph node mapping and biopsies at [facility].  Final pathology showed clear margins and negative nodes.   No complaints today.  Occasional mild bilateral LE swelling.     Gyn Oncology history Seen by Dr Amalia Hailey 07/24/22.   Vulvar itching and irritation and noticed a blister on her labia.  She went to her PCP who did HSV and RPR which have proven to be negative.  She had some discharge. She reports no previous issues with abnormal Pap smears. Exam: Right labial lesion with leukoplakia and discolored draining area.  Rugated and thickened.   A.   VULVA, BIOPSY:  -      INVASIVE SQUAMOUS CELL CARCINOMA, WELL-DIFFERENTIATED.   B.   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   Some SI and Hip DJD and walks with cane.    09/03/22 - Underwent right hemi-vulvectomy and bilateral sentinel lymph node mapping and biopsies at [facility].  Final pathology showed clear margins and negative nodes.    A. 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 Depth of invasion: 4 mm. Margins of resection are negative for invasive and in situ carcinoma, see additional final margins below. B. Right vulvar medial margin, excision: Skin, negative for malignancy. C. Right lateral vulvar margin, excision: Skin, negative for malignancy. D. Right inguinal sentinel lymph node, green and hot, lymphadenectomy: One lymph node, negative for malignancy (0/1). E. Left inguinal sentinel lymph node, green and blue, lymphadenectomy: Two lymph nodes, negative for  malignancy (0/2).  TUMOR    Tumor Focality:    Unifocal     Tumor Site:    Right vulva       :    Labium majus       :    Labium minus     Tumor Size:    Greatest Dimension (Centimeters): 3 cm     Histologic Type:    Squamous cell carcinoma, HPV-associated     Histologic Grade:    G2, moderately differentiated     Depth of Tumor Invasion:    4 mm     Tumor Border:    Infiltrating     Other Tissue / Organ Involvement:    Not applicable     Lymphovascular Invasion:    Not identified   MARGINS    Margin Status for Invasive Carcinoma:    All margins negative for invasive carcinoma       Closest Margin(s) to Invasive Carcinoma:    Deep: At 6:00 margin       Distance from Invasive Carcinoma to Closest Margin:    3 mm     Margin Status for HSIL (VIN2-3) or dVIN:    All margins negative for high-grade squamous intraepithelial lesion (HSIL) and / or differentiated vulvar intraepithelial neoplasia (dVIN)    Problem List: Patient Active Problem List   Diagnosis Date Noted   Vulvar cancer ([place]) 08/07/2022   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 2 diabetes mellitus ([place]) 04/06/2019   Primary osteoarthritis of both hips 02/06/2018   Hypertension associated with diabetes (  [place]) 07/23/2017   Hyperlipidemia due to type 2 diabetes mellitus ([place]) 07/23/2017   Major depression, recurrent ([place]) 06/25/2016   Osteopenia 04/18/2016   Gastroesophageal reflux disease without esophagitis 05/12/2015   Chronic pain 05/12/2015   Hyperlipidemia 05/12/2015   Lumbar post-laminectomy syndrome 02/27/2015   Neuropathy due to secondary diabetes ([place]) 02/27/2015   Spinal stenosis, lumbar region, with neurogenic claudication 02/27/2015   Sacroiliac joint dysfunction of both sides 02/27/2015   Degenerative joint disease (DJD) of hip 02/27/2015   DDD (degenerative disc disease), cervical 02/27/2015   Bilateral occipital neuralgia 02/27/2015   Chronic  constipation 06/02/2014   Hypertension, benign 06/01/2014    Past Medical History: Past Medical History:  Diagnosis Date   Arthritis    Chronic low back pain    Depression    Diabetes mellitus    Hyperlipidemia    Hypertension     Past Surgical History: Past Surgical History:  Procedure Laterality Date   CHOLECYSTECTOMY  1996   CYSTOSCOPY WITH URETEROSCOPY Right 09/01/2013   Procedure: CYSTOSCOPY WITH UNROOFING  RIGHT URETEROCELE AND URETERAL STONE REMOVED  WITH GYRUS COLLINS KNIFE. ;  Surgeon: Irine Seal, MD;  Location: [facility];  Service: Urology;  Laterality: Right;  cysto, right uretersoscopy and stone extraction   collins knife   LUMBAR DISC SURGERY  1993   L4  --  L5      OB History:  OB History  Gravida Para Term Preterm AB Living  '3 3 3     3  '$ SAB IAB Ectopic Multiple Live Births          3    # Outcome Date GA Lbr Len/2nd Weight Sex Delivery Anes PTL Lv  3 Term 1982     Vag-Spont   LIV  2 Term 1974     Vag-Spont   LIV  1 Term 19     Vag-Spont   LIV    Family History: Family History  Problem Relation Age of Onset   Diabetes Mother    Heart disease Mother    Hypertension Mother    Breast cancer Neg Hx     Social History: Social History   Socioeconomic History   Marital status: Married    Spouse name: Not on file   Number of children: 3   Years of education: Not on file   Highest education level: 12th grade  Occupational History   Occupation: retired  Tobacco Use   Smoking status: Every Day    Packs/day: 0.50    Years: 20.00    Total pack years: 10.00    Types: Cigarettes   Smokeless tobacco: Never   Tobacco comments:    she is cutting down   Vaping Use   Vaping Use: Never used  Substance and Sexual Activity   Alcohol use: No    Alcohol/week: 0.0 standard drinks of alcohol   Drug use: No   Sexual activity: Not Currently    Partners: Male    Birth control/protection: Post-menopausal  Other Topics Concern   Not on  file  Social History Narrative   Not on file   Social Determinants of Health   Financial Resource Strain: Low Risk  (08/29/2022)   Overall Financial Resource Strain (CARDIA)    Difficulty of Paying Living Expenses: Not very hard  Food Insecurity: No Food Insecurity (08/29/2022)   Hunger Vital Sign    Worried About Running Out of Food in the Last Year: Never true    Ran Out of  Food in the Last Year: Never true  Transportation Needs: No Transportation Needs (08/29/2022)   PRAPARE - Hydrologist (Medical): No    Lack of Transportation (Non-Medical): No  Physical Activity: Insufficiently Active (08/29/2022)   Exercise Vital Sign    Days of Exercise per Week: 3 days    Minutes of Exercise per Session: 20 min  Stress: No Stress Concern Present (08/29/2022)   [place]    Feeling of Stress : Only a little  Social Connections: Socially Integrated (08/29/2022)   Social Connection and Isolation Panel [NHANES]    Frequency of Communication with Friends and Family: More than three times a week    Frequency of Social Gatherings with Friends and Family: More than three times a week    Attends Religious Services: More than 4 times per year    Active Member of [employer] or Organizations: Yes    Attends Archivist Meetings: More than 4 times per year    Marital Status: Married  Human resources officer Violence: Not At Risk (08/29/2022)   Humiliation, Afraid, Rape, and Kick questionnaire    Fear of Current or Ex-Partner: No    Emotionally Abused: No    Physically Abused: No    Sexually Abused: No    Allergies: Allergies  Allergen Reactions   Diclofenac Sodium Swelling   Naproxen Sodium Swelling   Etodolac Swelling   Gabapentin Swelling   Naproxen Swelling    Current Medications: Current Outpatient Medications  Medication Sig Dispense Refill   aspirin EC 81 MG tablet Take 81 mg by mouth  daily.     benzonatate (TESSALON) 100 MG capsule Take 1 capsule (100 mg total) by mouth 3 (three) times daily as needed for cough. 30 capsule 0   Blood Glucose Monitoring Suppl (ONETOUCH VERIO) w/Device KIT      carvedilol (COREG) 3.125 MG tablet TAKE 1 TABLET BY MOUTH TWICE A DAY 180 tablet 1   cetirizine (ZYRTEC) 10 MG tablet Take 1 tablet (10 mg total) by mouth at bedtime. 30 tablet 11   dorzolamide-timolol (COSOPT) 22.3-6.8 MG/ML ophthalmic solution Place 1 drop into both eyes 2 (two) times daily.     fentaNYL (DURAGESIC - DOSED MCG/HR) 75 MCG/HR Apply 2 patches to skin every 2 days if tolerated.   NOTE: Do not apply 100 g per hour fentanyl patch since insurance will not approve 100 g patch for you 30 patch 0   glucose blood test strip 1 strip by Other route 2 (two) times daily.     metFORMIN (GLUCOPHAGE) 1000 MG tablet Take 1 tablet (1,000 mg total) by mouth 2 (two) times daily with a meal. 180 tablet 1   metoCLOPramide (REGLAN) 5 MG tablet TAKE 1 TABLET BY MOUTH THREE TIMES A DAY 270 tablet 1   Multiple Vitamins-Minerals (MULTIVITAMIN WITH MINERALS) tablet Take 1 tablet by mouth daily.     NOVOLIN N RELION 100 UNIT/ML injection INJECT 28 UNITS SUBCUTANEOUSLY TWO TIMES DAILY.     NOVOLIN R RELION 100 UNIT/ML injection INJECT 20 UNITS SUBCUTANEOUSLY THREE TIMES DAILY BEFORE MEAL(S) (ADD SLIDING SCALE AS NECESSARY)     Olmesartan-amLODIPine-HCTZ 40-5-25 MG TABS Take 1 tablet by mouth daily. 90 tablet 1   Oxycodone HCl 20 MG TABS LIMIT 1/2 TO 1 TABLET BY MOUTH 3 TO 5 TIMES PER DAY IF TOLERATED *NOTE TABLET IS TWICE AS STRONG*     polyethylene glycol (MIRALAX / GLYCOLAX) packet Take 17 g  by mouth daily. 14 each 0   pravastatin (PRAVACHOL) 40 MG tablet Take 1 tablet (40 mg total) by mouth daily. 90 tablet 1   venlafaxine XR (EFFEXOR-XR) 150 MG 24 hr capsule Take 1 capsule (150 mg total) by mouth daily with breakfast. 90 capsule 1   No current facility-administered medications for this visit.     Review of Systems General: negative for, fevers, chills, fatigue, changes in sleep, changes in weight or appetite Skin: negative for changes in color, texture, moles or lesions Eyes: negative for, changes in vision, pain, diplopia HEENT: negative for, change in hearing, pain, discharge, tinnitus, vertigo, voice changes, sore throat, neck masses Pulmonary: negative for, dyspnea, orthopnea, productive cough Cardiac: negative for, palpitations, syncope, pain, discomfort, pressure Gastrointestinal: negative for, dysphagia, nausea, vomiting, jaundice, pain, constipation, diarrhea, hematemesis, hematochezia Genitourinary/Sexual: negative for, dysuria, discharge, hesitancy, nocturia, retention, stones, infections, STD's, incontinence Ob/Gyn: negative for, irregular bleeding, pain Musculoskeletal: hip joints and SI joints with DJD and decreased mobility. Hematology: negative for, easy bruising, bleeding Neurologic/Psych: negative for, headaches, seizures, paralysis, weakness, tremor, change in gait, change in sensation, mood swings, depression, anxiety, change in memory  Objective:  Physical Examination:  BP (!) 165/62   Pulse 81   Temp (!) 96.9 F (36.1 C)   Resp 19   Wt 179 lb 1.6 oz (81.2 kg)   SpO2 99%   BMI 33.84 kg/m    ECOG Performance Status: 1 - Symptomatic but completely ambulatory  General appearance: alert, cooperative, and appears stated age HEENT:PERRLA, neck supple with midline trachea, and thyroid without masses Lymph node survey: non-palpable, axillary, inguinal, supraclavicular Cardiovascular: regular rate and rhythm, no murmurs or gallops Respiratory: normal air entry, lungs clear to auscultation. Abdomen: soft, non-tender, without masses or organomegaly and no hernias Back: inspection of back is normal Extremities: extremities normal, atraumatic, no cyanosis or edema Skin exam - normal coloration and turgor, no rashes, no suspicious skin lesions  noted. Neurological exam reveals alert, oriented, normal speech, no focal findings or movement disorder noted.  Pelvic: exam chaperoned by nurse;  Vulva: right vulva still healing, there is an area about 3 cm at the level of the urethra that looks like it opened and is healing by secondary intention.  Good granulation tissue present and no evidence of infection; Vagina: normal vagina, some discharge; Adnexa: normal adnexa in size, nontender and no masses; Uterus: uterus is normal size, shape, consistency and nontender; Cervix: anteverted; Rectal: not indicated Groins negative for palpable nodes., Incisions healing well.     Lab Review Lab Results  Component Value Date   WBC 7.4 06/28/2019   HGB 12.7 06/28/2019   HCT 38.5 06/28/2019   MCV 89.3 06/28/2019   PLT 325 06/28/2019     Chemistry      Component Value Date/Time   NA 141 12/12/2021 1121   NA 137 02/21/2016 0000   K 4.6 12/12/2021 1121   CL 102 12/12/2021 1121   CO2 28 12/12/2021 1121   BUN 25 12/12/2021 1121   BUN 9 02/21/2016 0000   CREATININE 2.24 (H) 12/12/2021 1121   GLU 344 06/12/2017 0000      Component Value Date/Time   CALCIUM 9.3 12/12/2021 1121   ALKPHOS 95 12/23/2016 0933   AST 15 12/12/2021 1121   ALT 14 12/12/2021 1121   BILITOT 0.2 12/12/2021 1121   BILITOT <0.2 05/12/2015 1010        Assessment:  Lorraine Jones is a 72 y.o. 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  Underwent right hemi-vulvectomy and bilateral sentinel lymph node mapping and biopsies at [facility].  Final pathology showed 4 mm invasion with clear margins and negative nodes.    Part of the incision looks like it opened and is healing by secondary intention.  No evidence of infection.   Medical co-morbidities complicating care: HTN, AODM/insulin, SI and hip joint DJD with reduced mobility and uses cane, BMI 36.  Plan:   Problem List Items Addressed This Visit       Genitourinary   Vulvar cancer  ([place]) - Primary   I discussed options for management with patient and that we recommend no further therapy in view of negative margins and nodes.    We will see her back in 2 months to reassess healing of the right vulvar incision.   The patient's diagnosis, an outline of the further diagnostic and laboratory studies which will be required, the recommendation, and alternatives were discussed.  All questions were answered to the patient's satisfaction.  Mellody Drown, MD  CC:  Steele Sizer, [place] [address] [place],  [place] [address] [phone]

## 2022-10-09 NOTE — Addendum Note (Signed)
Addended by: Verlon Au on: 10/09/2022 01:45 PM   Modules accepted: Orders

## 2022-10-18 NOTE — Progress Notes (Signed)
Name: Lorraine Jones   MRN: [id number]    DOB: 1950-12-12   Date:10/21/2022       Progress Note  Subjective  Chief Complaint  Follow Up  HPI  DMII with neuropathy,dyslipidemia and gastroparesis, microalbuminuria ( resolved last labs but GFR dropped )  and mild diabetic retinopathy (diagnosed July 2020). She is now under the care of Dr. Honor Junes, last A1C was done in Dec and it was down from 8.2 % to 8.1 %  She is now on  Metformin and insulin R and N types Long acting 34 units BID and pre meal is 28 units qac She denies polydipsia or polyphagia, or polyuria , she states neuropathy symptoms under control with gabapentin , occasionally feels very full after a meals but controlled with metoclopramide She is compliant with statins and aspirin daily also on ARB. Last LDL was done March was at 52 , we will recheck it next visit    HTN in DM:  she is compliant with medication .No chest pain, dizziness  or palpitation. She is on lower dose of Tribenzor 40/5/25 and Carvedilol, BP is at goal, lower extremity edema resolved at this time of the year  Chronic Pain: she sees Dr. Primus Bravo  once a month, taking pain medication as prescribed, she has constipation secondary to narcotics and is taking Miralax otc to control symptoms, she has bowel movements ever other day, but not straining or blood in stools - [place] of 4 .  She has chronic back pain, and pain is on average 5/10  it can go up to 9/1, described as nagging toothache that radiates sometimes to right lower leg.She has noticed increase in pain on right groin ( she has OA) she recently had injection of right hip and that has helped with pain   Hyperlipidemia: She stopped Crestor and went back on Pravastatin because of side effects.  She denies myalgias secondary to medication. No chest pain. Last LDL was 70 , we will recheck it next visit    Osteopenia: last bone density showed FRAX of 3.3 % for major osteoporotic fracture and 0.5 % for hip fracture.  Osteopenia on wrist only, advised to continue high calcium diet and vitamin D supplementation.   Obesity: BMI is now below 35, she lost another 2 lbs since last visit. She is a member of the [employer] but has not been there lately, discussed importance of staying active    Depression in remission  :  she has a long of major depression, part of it secondary to living in pain and inability to do things she likes. She states Effexor and seems to be in remission. . She helps take care of her grandson and that has been goof for her mood   Eczema: doing well at this time    Patient Active Problem List   Diagnosis Date Noted   Vulvar cancer ([place]) 08/07/2022   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 2 diabetes mellitus ([place]) 04/06/2019   Primary osteoarthritis of both hips 02/06/2018   Hypertension associated with diabetes ([place]) 07/23/2017   Hyperlipidemia due to type 2 diabetes mellitus ([place]) 07/23/2017   Major depression, recurrent ([place]) 06/25/2016   Osteopenia 04/18/2016   Gastroesophageal reflux disease without esophagitis 05/12/2015   Chronic pain 05/12/2015   Hyperlipidemia 05/12/2015   Lumbar post-laminectomy syndrome 02/27/2015   Neuropathy due to secondary diabetes ([place]) 02/27/2015   Spinal stenosis, lumbar region, with neurogenic claudication 02/27/2015   Sacroiliac  joint dysfunction of both sides 02/27/2015   Degenerative joint disease (DJD) of hip 02/27/2015   DDD (degenerative disc disease), cervical 02/27/2015   Bilateral occipital neuralgia 02/27/2015   Chronic constipation 06/02/2014   Hypertension, benign 06/01/2014    Past Surgical History:  Procedure Laterality Date   CHOLECYSTECTOMY  09/30/1994   CYSTOSCOPY WITH URETEROSCOPY Right 09/01/2013   Procedure: CYSTOSCOPY WITH UNROOFING  RIGHT URETEROCELE AND URETERAL STONE REMOVED  WITH GYRUS COLLINS KNIFE. ;  Surgeon: Irine Seal, MD;  Location: [facility];  Service: Urology;  Laterality: Right;  cysto, right uretersoscopy and stone extraction   collins knife   LUMBAR DISC SURGERY  10/01/1991   L4  --  L5   VULVECTOMY PARTIAL  09/11/2022    Family History  Problem Relation Age of Onset   Diabetes Mother    Heart disease Mother    Hypertension Mother    Breast cancer Neg Hx     Social History   Tobacco Use   Smoking status: Every Day    Packs/day: 0.50    Years: 20.00    Total pack years: 10.00    Types: Cigarettes   Smokeless tobacco: Never   Tobacco comments:    she is cutting down   Substance Use Topics   Alcohol use: No    Alcohol/week: 0.0 standard drinks of alcohol     Current Outpatient Medications:    aspirin EC 81 MG tablet, Take 81 mg by mouth daily., Disp: , Rfl:    Blood Glucose Monitoring Suppl (ONETOUCH VERIO) w/Device KIT, , Disp: , Rfl:    cetirizine (ZYRTEC) 10 MG tablet, Take 1 tablet (10 mg total) by mouth at bedtime., Disp: 30 tablet, Rfl: 11   dorzolamide-timolol (COSOPT) 22.3-6.8 MG/ML ophthalmic solution, Place 1 drop into both eyes 2 (two) times daily., Disp: , Rfl:    fentaNYL (DURAGESIC - DOSED MCG/HR) 75 MCG/HR, Apply 2 patches to skin every 2 days if tolerated.   NOTE: Do not apply 100 g per hour fentanyl patch since insurance will not approve 100 g patch for you, Disp: 30 patch, Rfl: 0   glucose blood test strip, 1 strip by Other route 2 (two) times daily., Disp: , Rfl:    metFORMIN (GLUCOPHAGE) 1000 MG tablet, Take 1 tablet (1,000 mg total) by mouth 2 (two) times daily with a meal., Disp: 180 tablet, Rfl: 1   metoCLOPramide (REGLAN) 5 MG tablet, TAKE 1 TABLET BY MOUTH THREE TIMES A DAY, Disp: 270 tablet, Rfl: 1   Multiple Vitamins-Minerals (MULTIVITAMIN WITH MINERALS) tablet, Take 1 tablet by mouth daily., Disp: , Rfl:    NOVOLIN N RELION 100 UNIT/ML injection, INJECT 28 UNITS SUBCUTANEOUSLY TWO TIMES DAILY., Disp: , Rfl:    NOVOLIN R RELION 100 UNIT/ML injection, INJECT 20 UNITS  SUBCUTANEOUSLY THREE TIMES DAILY BEFORE MEAL(S) (ADD SLIDING SCALE AS NECESSARY), Disp: , Rfl:    Oxycodone HCl 20 MG TABS, LIMIT 1/2 TO 1 TABLET BY MOUTH 3 TO 5 TIMES PER DAY IF TOLERATED *NOTE TABLET IS TWICE AS STRONG*, Disp: , Rfl:    polyethylene glycol (MIRALAX / GLYCOLAX) packet, Take 17 g by mouth daily., Disp: 14 each, Rfl: 0   carvedilol (COREG) 3.125 MG tablet, Take 1 tablet (3.125 mg total) by mouth 2 (two) times daily., Disp: 180 tablet, Rfl: 1   Olmesartan-amLODIPine-HCTZ 40-5-25 MG TABS, Take 1 tablet by mouth daily., Disp: 90 tablet, Rfl: 1   pravastatin (PRAVACHOL) 40 MG tablet, Take 1 tablet (40 mg  total) by mouth daily., Disp: 90 tablet, Rfl: 1   venlafaxine XR (EFFEXOR-XR) 150 MG 24 hr capsule, Take 1 capsule (150 mg total) by mouth daily with breakfast., Disp: 90 capsule, Rfl: 1  Allergies  Allergen Reactions   Diclofenac Sodium Swelling   Naproxen Sodium Swelling   Etodolac Swelling   Gabapentin Swelling   Naproxen Swelling    I personally reviewed active problem list, medication list, allergies, family history, social history, health maintenance with the patient/caregiver today.   ROS  Constitutional: Negative for fever or weight change.  Respiratory: Negative for cough and shortness of breath.   Cardiovascular: Negative for chest pain or palpitations.  Gastrointestinal: Negative for abdominal pain, no bowel changes.  Musculoskeletal: positive for gait problem but no  joint swelling.  Skin: Negative for rash.  Neurological: Negative for dizziness or headache.  No other specific complaints in a complete review of systems (except as listed in HPI above).   Objective  Vitals:   10/21/22 1018  BP: 138/64  Pulse: 96  Resp: 16  Temp: 98 F (36.7 C)  TempSrc: Oral  SpO2: 97%  Weight: 177 lb 9.6 oz (80.6 kg)  Height: '5\' 1"'$  (1.549 m)    Body mass index is 33.56 kg/m.  Physical Exam  Constitutional: Patient appears well-developed and well-nourished.  Obese  No distress.  HEENT: head atraumatic, normocephalic, pupils equal and reactive to light,, neck supple Cardiovascular: Normal rate, regular rhythm and normal heart sounds.  No murmur heard. No BLE edema. Pulmonary/Chest: Effort normal and breath sounds normal. No respiratory distress. Abdominal: Soft.  There is no tenderness. Muscular skeletal: still using a cane, pain 5/10 on her back Psychiatric: Patient has a normal mood and affect. behavior is normal. Judgment and thought content normal.   Recent Results (from the past 2160 hour(s))  Surgical pathology     Status: None   Collection Time: 07/24/22 12:06 PM  Result Value Ref Range   SURGICAL PATHOLOGY      SURGICAL PATHOLOGY CASE: [id number] PATIENT: Koa Aken Surgical Pathology Report     Clinical History: Vulvar lesion (nt)     FINAL MICROSCOPIC DIAGNOSIS:  A.   VULVA, BIOPSY: -      INVASIVE SQUAMOUS CELL CARCINOMA, WELL-DIFFERENTIATED.  B.   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    GROSS DESCRIPTION:  A: Received in formalin are tan, soft tissue fragments that are submitted in toto. Number: 2 size: 0.2 and 0.3 cm blocks: 1  B: Received in formalin is a tan-brown, soft tissue fragment that is submitted in toto.  Size: 0.5 cm, 1 block submitted.  (GRP 07/24/2022)   Final Diagnosis performed by Dione Plover.   Electronically signed 07/25/2022 Technical and / or Professional components performed at [employer]. [facility], [place] [address] [place], [place] [address].  Immunohistochemistry Technical component (if applicable) was performed at [facility]. [address] [place], [place], [place] [address].   IMMUNOHISTOCHEMISTRY DISCLAIMER (if applicable): Some of these immunohistochemical stains may have been developed and the performance characteristics determine by [facility]. Some may not have been cleared or approved by the U.S.  Food and Drug Administration. The FDA has determined that such clearance or approval is not necessary. This test is used for clinical purposes. It should not be regarded as investigational or for research. This laboratory is certified under the [place] (CLIA-88) as qualified to perform high complexity clinical laboratory  testing.  The controls stained appropriately.   Wet prep, genital     Status: Abnormal   Collection Time: 08/07/22 12:00 PM  Result Value Ref Range   Yeast Wet Prep HPF POC PRESENT (A) NONE SEEN   Trich, Wet Prep NONE SEEN NONE SEEN   Clue Cells Wet Prep HPF POC NONE SEEN NONE SEEN   WBC, Wet Prep HPF POC >=10 (A) <10   Sperm NONE SEEN     Comment: Performed at [facility], [address] [place], [place] [address]  HM DIABETES EYE EXAM     Status: Abnormal   Collection Time: 08/16/22 12:00 AM  Result Value Ref Range   HM Diabetic Eye Exam Retinopathy (A) No Retinopathy    Comment: [place]  CBC and differential     Status: None   Collection Time: 08/30/22 12:00 AM  Result Value Ref Range   Hemoglobin 13.5 12.0 - 16.0   HCT 42 36 - 46   Platelets 289 150 - 400 K/uL   WBC 9.4   CBC     Status: None   Collection Time: 08/30/22 12:00 AM  Result Value Ref Range   RBC 4.45 3.87 - 6.78  Basic metabolic panel     Status: Abnormal   Collection Time: 08/30/22 12:00 AM  Result Value Ref Range   Glucose 58    BUN 14 4 - 21   CO2 29 (A) 13 - 22   Creatinine 1.1 0.5 - 1.1   Potassium 3.9 3.5 - 5.1 mEq/L   Sodium 142 137 - 147   Chloride 104 99 - 108  Comprehensive metabolic panel     Status: None   Collection Time: 08/30/22 12:00 AM  Result Value Ref Range   eGFR 54    Calcium 9.5 8.7 - 10.7   Albumin 4.1 3.5 - 5.0  Hepatic function panel     Status: None   Collection Time: 08/30/22 12:00 AM  Result Value Ref Range   Alkaline Phosphatase 94 25 - 125   ALT 23 7 - 35 U/L   AST 27 13 - 35    Bilirubin, Total 0.7   Hemoglobin A1c     Status: None   Collection Time: 08/30/22 12:00 AM  Result Value Ref Range   Hemoglobin A1C 8.1     Diabetic Foot Exam: Diabetic Foot Exam - Simple   Simple Foot Form Visual Inspection See comments: Yes Sensation Testing Intact to touch and monofilament testing bilaterally: Yes Pulse Check Posterior Tibialis and Dorsalis pulse intact bilaterally: Yes Comments Thick nails.       PHQ2/9:    10/21/2022   10:20 AM 09/27/2022   12:17 PM 08/29/2022    8:40 AM 07/17/2022    8:29 AM 05/21/2022    9:36 AM  Depression screen PHQ 2/9  Decreased Interest 0 0 0 0 0  Down, Depressed, Hopeless 0 0 0 0 0  PHQ - 2 Score 0 0 0 0 0  Altered sleeping 0 0 0 0 0  Tired, decreased energy 0 0 0 0 0  Change in appetite 0 0 0 0 0  Feeling bad or failure about yourself  0 0 0 0 0  Trouble concentrating 0 0 0 0 0  Moving slowly or fidgety/restless 0 0 0 0 0  Suicidal thoughts 0 0 0 0 0  PHQ-9 Score 0 0 0 0 0  Difficult doing work/chores    Not difficult at all  phq 9 is negative   Fall Risk:    10/21/2022   10:20 AM 09/27/2022   12:17 PM 08/29/2022    8:43 AM 07/17/2022    8:28 AM 05/21/2022    9:36 AM  Fall Risk   Falls in the past year? 0 0 0 0 0  Number falls in past yr:  0  0 0  Injury with Fall?  0  0 0  Risk for fall due to : No Fall Risks No Fall Risks No Fall Risks  Impaired balance/gait  Follow up Falls prevention discussed;Falls evaluation completed;Education provided Falls prevention discussed Falls prevention discussed;Education provided Falls evaluation completed Falls prevention discussed      Functional Status Survey: Is the patient deaf or have difficulty hearing?: No Does the patient have difficulty seeing, even when wearing glasses/contacts?: No Does the patient have difficulty concentrating, remembering, or making decisions?: No Does the patient have difficulty walking or climbing stairs?: Yes (Ortho patient/uses  cane) Does the patient have difficulty dressing or bathing?: No Does the patient have difficulty doing errands alone such as visiting a doctor's office or shopping?: No    Assessment & Plan  1. Dyslipidemia associated with type 2 diabetes mellitus (HCC)  A1C is improving  - HM Diabetes Foot Exam  2. Hypertension associated with diabetes ([place])  At goal   3. Vulvar cancer ([place])  Recent partial vulvectomy right side and will follow up with uro-gynecologist in 2 months   4. Major depression in remission [facility])  Continue current regiment   5. Chronic pain syndrome  Still going to the pain clinic   6. Osteopenia after menopause  Discussed high calcium diet   7. Hypertension, benign  At goal   8. Primary osteoarthritis of both hips  Stable   9. Obesity (BMI 30.0-34.9)  Discussed with the patient the risk posed by an increased BMI. Discussed importance of portion control, calorie counting and at least 150 minutes of physical activity weekly. Avoid sweet beverages and drink more water. Eat at least 6 servings of fruit and vegetables daily

## 2022-10-21 ENCOUNTER — Encounter: Payer: Self-pay | Admitting: Family Medicine

## 2022-10-21 ENCOUNTER — Ambulatory Visit (INDEPENDENT_AMBULATORY_CARE_PROVIDER_SITE_OTHER): Payer: Medicare Other | Admitting: Family Medicine

## 2022-10-21 VITALS — BP 138/64 | HR 96 | Temp 98.0°F | Resp 16 | Ht 61.0 in | Wt 177.6 lb

## 2022-10-21 DIAGNOSIS — E785 Hyperlipidemia, unspecified: Secondary | ICD-10-CM

## 2022-10-21 DIAGNOSIS — I152 Hypertension secondary to endocrine disorders: Secondary | ICD-10-CM | POA: Diagnosis not present

## 2022-10-21 DIAGNOSIS — M160 Bilateral primary osteoarthritis of hip: Secondary | ICD-10-CM

## 2022-10-21 DIAGNOSIS — I10 Essential (primary) hypertension: Secondary | ICD-10-CM | POA: Diagnosis not present

## 2022-10-21 DIAGNOSIS — E119 Type 2 diabetes mellitus without complications: Secondary | ICD-10-CM

## 2022-10-21 DIAGNOSIS — C519 Malignant neoplasm of vulva, unspecified: Secondary | ICD-10-CM

## 2022-10-21 DIAGNOSIS — E1169 Type 2 diabetes mellitus with other specified complication: Secondary | ICD-10-CM | POA: Diagnosis not present

## 2022-10-21 DIAGNOSIS — M8580 Other specified disorders of bone density and structure, unspecified site: Secondary | ICD-10-CM | POA: Diagnosis not present

## 2022-10-21 DIAGNOSIS — E1159 Type 2 diabetes mellitus with other circulatory complications: Secondary | ICD-10-CM

## 2022-10-21 DIAGNOSIS — F325 Major depressive disorder, single episode, in full remission: Secondary | ICD-10-CM | POA: Diagnosis not present

## 2022-10-21 DIAGNOSIS — Z780 Asymptomatic menopausal state: Secondary | ICD-10-CM

## 2022-10-21 DIAGNOSIS — G894 Chronic pain syndrome: Secondary | ICD-10-CM

## 2022-10-21 DIAGNOSIS — E669 Obesity, unspecified: Secondary | ICD-10-CM

## 2022-10-21 MED ORDER — PRAVASTATIN SODIUM 40 MG PO TABS: 40.0000 mg | ORAL_TABLET | Freq: Every day | ORAL | 1 refills | Status: DC

## 2022-10-21 MED ORDER — CARVEDILOL 3.125 MG PO TABS: 3.1250 mg | ORAL_TABLET | Freq: Two times a day (BID) | ORAL | 1 refills | Status: DC

## 2022-10-21 MED ORDER — VENLAFAXINE HCL ER 150 MG PO CP24: 150.0000 mg | ORAL_CAPSULE | Freq: Every day | ORAL | 1 refills | Status: DC

## 2022-10-21 MED ORDER — OLMESARTAN-AMLODIPINE-HCTZ 40-5-25 MG PO TABS: 1.0000 | ORAL_TABLET | Freq: Every day | ORAL | 1 refills | Status: DC

## 2022-10-28 DIAGNOSIS — M5136 Other intervertebral disc degeneration, lumbar region: Secondary | ICD-10-CM | POA: Diagnosis not present

## 2022-10-28 DIAGNOSIS — G894 Chronic pain syndrome: Secondary | ICD-10-CM | POA: Diagnosis not present

## 2022-11-05 DIAGNOSIS — E1065 Type 1 diabetes mellitus with hyperglycemia: Secondary | ICD-10-CM | POA: Diagnosis not present

## 2022-11-18 ENCOUNTER — Other Ambulatory Visit: Payer: Self-pay | Admitting: Family Medicine

## 2022-11-25 DIAGNOSIS — M5136 Other intervertebral disc degeneration, lumbar region: Secondary | ICD-10-CM | POA: Diagnosis not present

## 2022-11-25 DIAGNOSIS — G894 Chronic pain syndrome: Secondary | ICD-10-CM | POA: Diagnosis not present

## 2022-11-25 DIAGNOSIS — Z79899 Other long term (current) drug therapy: Secondary | ICD-10-CM | POA: Diagnosis not present

## 2022-12-17 DIAGNOSIS — E1159 Type 2 diabetes mellitus with other circulatory complications: Secondary | ICD-10-CM | POA: Diagnosis not present

## 2022-12-17 DIAGNOSIS — Z794 Long term (current) use of insulin: Secondary | ICD-10-CM | POA: Diagnosis not present

## 2022-12-17 DIAGNOSIS — E1165 Type 2 diabetes mellitus with hyperglycemia: Secondary | ICD-10-CM | POA: Diagnosis not present

## 2022-12-17 DIAGNOSIS — I152 Hypertension secondary to endocrine disorders: Secondary | ICD-10-CM | POA: Diagnosis not present

## 2022-12-17 DIAGNOSIS — E1169 Type 2 diabetes mellitus with other specified complication: Secondary | ICD-10-CM | POA: Diagnosis not present

## 2022-12-17 DIAGNOSIS — E538 Deficiency of other specified B group vitamins: Secondary | ICD-10-CM | POA: Diagnosis not present

## 2022-12-17 DIAGNOSIS — Z79899 Other long term (current) drug therapy: Secondary | ICD-10-CM | POA: Diagnosis not present

## 2022-12-17 DIAGNOSIS — E785 Hyperlipidemia, unspecified: Secondary | ICD-10-CM | POA: Diagnosis not present

## 2022-12-17 LAB — BASIC METABOLIC PANEL
BUN: 12 (ref 4–21)
CO2: 30 — AB (ref 13–22)
Chloride: 104 (ref 99–108)
Creatinine: 1 (ref 0.5–1.1)
Glucose: 123
Potassium: 3.9 mEq/L (ref 3.5–5.1)
Sodium: 140 (ref 137–147)

## 2022-12-17 LAB — HEPATIC FUNCTION PANEL
ALT: 17 U/L (ref 7–35)
AST: 16 (ref 13–35)
Alkaline Phosphatase: 123 (ref 25–125)
Bilirubin, Total: 0.3

## 2022-12-17 LAB — COMPREHENSIVE METABOLIC PANEL
Albumin: 4.3 (ref 3.5–5.0)
Calcium: 9.6 (ref 8.7–10.7)
eGFR: 60

## 2022-12-17 LAB — LIPID PANEL
Cholesterol: 170 (ref 0–200)
HDL: 70 (ref 35–70)
LDL Cholesterol: 52
Triglycerides: 240 — AB (ref 40–160)

## 2022-12-17 LAB — HEMOGLOBIN A1C: Hemoglobin A1C: 7.4

## 2022-12-17 LAB — VITAMIN B12: Vitamin B-12: 209

## 2022-12-17 LAB — TSH: TSH: 2.47 (ref 0.41–5.90)

## 2022-12-18 ENCOUNTER — Inpatient Hospital Stay: Payer: Medicare Other | Attending: Obstetrics and Gynecology | Admitting: Obstetrics and Gynecology

## 2022-12-18 VITALS — BP 164/80 | HR 89 | Temp 97.8°F | Resp 20 | Wt 181.4 lb

## 2022-12-18 DIAGNOSIS — B977 Papillomavirus as the cause of diseases classified elsewhere: Secondary | ICD-10-CM | POA: Diagnosis not present

## 2022-12-18 DIAGNOSIS — Z9079 Acquired absence of other genital organ(s): Secondary | ICD-10-CM | POA: Insufficient documentation

## 2022-12-18 DIAGNOSIS — Z08 Encounter for follow-up examination after completed treatment for malignant neoplasm: Secondary | ICD-10-CM

## 2022-12-18 DIAGNOSIS — F17210 Nicotine dependence, cigarettes, uncomplicated: Secondary | ICD-10-CM | POA: Insufficient documentation

## 2022-12-18 DIAGNOSIS — Z8544 Personal history of malignant neoplasm of other female genital organs: Secondary | ICD-10-CM | POA: Diagnosis not present

## 2022-12-18 DIAGNOSIS — C519 Malignant neoplasm of vulva, unspecified: Secondary | ICD-10-CM

## 2022-12-18 NOTE — Progress Notes (Signed)
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  Subjective:  Lorraine Jones is a 72 y.o. female who is seen in consultation from Dr. Amalia Hailey for vulvar cancer.   09/03/22 - Underwent right hemi-vulvectomy and bilateral sentinel lymph node mapping and biopsies at [facility].  Final pathology showed clear margins and negative nodes.   When we saw her two months ago part of the incision had opened and was healing by secondary intention.   No complaints today.    Gyn Oncology history Seen by Dr Amalia Hailey 07/24/22.   Vulvar itching and irritation and noticed a blister on her labia.  She went to her PCP who did HSV and RPR which have proven to be negative.  She had some discharge. She reports no previous issues with abnormal Pap smears. Exam: Right labial lesion with leukoplakia and discolored draining area.  Rugated and thickened.   A.   VULVA, BIOPSY:  -      INVASIVE SQUAMOUS CELL CARCINOMA, WELL-DIFFERENTIATED.   B.   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   Some SI and Hip DJD and walks with cane.    09/03/22 - Underwent right hemi-vulvectomy and bilateral sentinel lymph node mapping and biopsies at [facility].  Final pathology showed clear margins and negative nodes.    A. 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 Depth of invasion: 4 mm. Margins of resection are negative for invasive and in situ carcinoma, see additional final margins below. B. Right vulvar medial margin, excision: Skin, negative for malignancy. C. Right lateral vulvar margin, excision: Skin, negative for malignancy. D. Right inguinal sentinel lymph node, green and hot, lymphadenectomy: One lymph node, negative for malignancy (0/1). E. Left inguinal sentinel lymph node, green  and blue, lymphadenectomy: Two lymph nodes, negative for malignancy (0/2).  TUMOR    Tumor Focality:    Unifocal     Tumor Site:    Right vulva       :    Labium majus       :    Labium minus     Tumor Size:    Greatest Dimension (Centimeters): 3 cm     Histologic Type:    Squamous cell carcinoma, HPV-associated     Histologic Grade:    G2, moderately differentiated     Depth of Tumor Invasion:    4 mm     Tumor Border:    Infiltrating     Other Tissue / Organ Involvement:    Not applicable     Lymphovascular Invasion:    Not identified   MARGINS    Margin Status for Invasive Carcinoma:    All margins negative for invasive carcinoma       Closest Margin(s) to Invasive Carcinoma:    Deep: At 6:00 margin       Distance from Invasive Carcinoma to Closest Margin:    3 mm     Margin Status for HSIL (VIN2-3) or dVIN:    All margins negative for high-grade squamous intraepithelial lesion (HSIL) and / or differentiated vulvar intraepithelial neoplasia (dVIN)    Problem List: Patient Active Problem List   Diagnosis Date Noted   Vulvar cancer ([place]) 08/07/2022   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 2 diabetes mellitus ([place]) 04/06/2019  Primary osteoarthritis of both hips 02/06/2018   Hypertension associated with diabetes ([place]) 07/23/2017   Hyperlipidemia due to type 2 diabetes mellitus ([place]) 07/23/2017   Major depression, recurrent ([place]) 06/25/2016   Osteopenia 04/18/2016   Gastroesophageal reflux disease without esophagitis 05/12/2015   Chronic pain 05/12/2015   Hyperlipidemia 05/12/2015   Lumbar post-laminectomy syndrome 02/27/2015   Neuropathy due to secondary diabetes ([place]) 02/27/2015   Spinal stenosis, lumbar region, with neurogenic claudication 02/27/2015   Sacroiliac joint dysfunction of both sides 02/27/2015   Degenerative joint disease (DJD) of hip 02/27/2015   DDD (degenerative disc disease), cervical 02/27/2015    Bilateral occipital neuralgia 02/27/2015   Chronic constipation 06/02/2014   Hypertension, benign 06/01/2014    Past Medical History: Past Medical History:  Diagnosis Date   Arthritis    Chronic low back pain    Depression    Diabetes mellitus    Hyperlipidemia    Hypertension     Past Surgical History: Past Surgical History:  Procedure Laterality Date   CHOLECYSTECTOMY  09/30/1994   CYSTOSCOPY WITH URETEROSCOPY Right 09/01/2013   Procedure: CYSTOSCOPY WITH UNROOFING  RIGHT URETEROCELE AND URETERAL STONE REMOVED  WITH GYRUS COLLINS KNIFE. ;  Surgeon: Irine Seal, MD;  Location: [place];  Service: Urology;  Laterality: Right;  cysto, right uretersoscopy and stone extraction   collins knife   LUMBAR DISC SURGERY  10/01/1991   L4  --  L5   VULVECTOMY PARTIAL  09/11/2022      OB History:  OB History  Gravida Para Term Preterm AB Living  3 3 3     3   SAB IAB Ectopic Multiple Live Births          3    # Outcome Date GA Lbr Len/2nd Weight Sex Delivery Anes PTL Lv  3 Term 1982     Vag-Spont   LIV  2 Term 1974     Vag-Spont   LIV  1 Term 26     Vag-Spont   LIV    Family History: Family History  Problem Relation Age of Onset   Diabetes Mother    Heart disease Mother    Hypertension Mother    Breast cancer Neg Hx     Social History: Social History   Socioeconomic History   Marital status: Married    Spouse name: Not on file   Number of children: 3   Years of education: Not on file   Highest education level: 12th grade  Occupational History   Occupation: retired  Tobacco Use   Smoking status: Every Day    Packs/day: 0.50    Years: 20.00    Additional pack years: 0.00    Total pack years: 10.00    Types: Cigarettes   Smokeless tobacco: Never   Tobacco comments:    she is cutting down   Vaping Use   Vaping Use: Never used  Substance and Sexual Activity   Alcohol use: No    Alcohol/week: 0.0 standard drinks of alcohol   Drug use: No    Sexual activity: Not Currently    Partners: Male    Birth control/protection: Post-menopausal  Other Topics Concern   Not on file  Social History Narrative   Not on file   Social Determinants of Health   Financial Resource Strain: Low Risk  (08/29/2022)   Overall Financial Resource Strain (CARDIA)    Difficulty of Paying Living Expenses: Not very hard  Food Insecurity: No Food Insecurity (08/29/2022)  Hunger Vital Sign    Worried About Running Out of Food in the Last Year: Never true    Ran Out of Food in the Last Year: Never true  Transportation Needs: No Transportation Needs (08/29/2022)   PRAPARE - Hydrologist (Medical): No    Lack of Transportation (Non-Medical): No  Physical Activity: Insufficiently Active (08/29/2022)   Exercise Vital Sign    Days of Exercise per Week: 3 days    Minutes of Exercise per Session: 20 min  Stress: No Stress Concern Present (08/29/2022)   [place]    Feeling of Stress : Only a little  Social Connections: Socially Integrated (08/29/2022)   Social Connection and Isolation Panel [NHANES]    Frequency of Communication with Friends and Family: More than three times a week    Frequency of Social Gatherings with Friends and Family: More than three times a week    Attends Religious Services: More than 4 times per year    Active Member of [employer] or Organizations: Yes    Attends Archivist Meetings: More than 4 times per year    Marital Status: Married  Human resources officer Violence: Not At Risk (08/29/2022)   Humiliation, Afraid, Rape, and Kick questionnaire    Fear of Current or Ex-Partner: No    Emotionally Abused: No    Physically Abused: No    Sexually Abused: No    Allergies: Allergies  Allergen Reactions   Diclofenac Sodium Swelling   Naproxen Sodium Swelling   Etodolac Swelling   Gabapentin Swelling   Naproxen Swelling     Current Medications: Current Outpatient Medications  Medication Sig Dispense Refill   aspirin EC 81 MG tablet Take 81 mg by mouth daily.     Blood Glucose Monitoring Suppl (ONETOUCH VERIO) w/Device KIT      carvedilol (COREG) 3.125 MG tablet Take 1 tablet (3.125 mg total) by mouth 2 (two) times daily. 180 tablet 1   cetirizine (ZYRTEC) 10 MG tablet Take 1 tablet (10 mg total) by mouth at bedtime. 30 tablet 11   dorzolamide-timolol (COSOPT) 22.3-6.8 MG/ML ophthalmic solution Place 1 drop into both eyes 2 (two) times daily.     fentaNYL (DURAGESIC - DOSED MCG/HR) 75 MCG/HR Apply 2 patches to skin every 2 days if tolerated.   NOTE: Do not apply 100 g per hour fentanyl patch since insurance will not approve 100 g patch for you 30 patch 0   glucose blood test strip 1 strip by Other route 2 (two) times daily.     metFORMIN (GLUCOPHAGE) 1000 MG tablet Take 1 tablet (1,000 mg total) by mouth 2 (two) times daily with a meal. 180 tablet 1   metoCLOPramide (REGLAN) 5 MG tablet Take 1 tablet (5 mg total) by mouth every 6 (six) hours as needed for nausea. 90 tablet 1   Multiple Vitamins-Minerals (MULTIVITAMIN WITH MINERALS) tablet Take 1 tablet by mouth daily.     NOVOLIN N RELION 100 UNIT/ML injection INJECT 28 UNITS SUBCUTANEOUSLY TWO TIMES DAILY.     NOVOLIN R RELION 100 UNIT/ML injection INJECT 20 UNITS SUBCUTANEOUSLY THREE TIMES DAILY BEFORE MEAL(S) (ADD SLIDING SCALE AS NECESSARY)     Olmesartan-amLODIPine-HCTZ 40-5-25 MG TABS Take 1 tablet by mouth daily. 90 tablet 1   Oxycodone HCl 20 MG TABS LIMIT 1/2 TO 1 TABLET BY MOUTH 3 TO 5 TIMES PER DAY IF TOLERATED *NOTE TABLET IS TWICE AS STRONG*  polyethylene glycol (MIRALAX / GLYCOLAX) packet Take 17 g by mouth daily. 14 each 0   pravastatin (PRAVACHOL) 40 MG tablet Take 1 tablet (40 mg total) by mouth daily. 90 tablet 1   venlafaxine XR (EFFEXOR-XR) 150 MG 24 hr capsule Take 1 capsule (150 mg total) by mouth daily with breakfast. 90 capsule 1    No current facility-administered medications for this visit.   Review of Systems General: negative for, fevers, chills, fatigue, changes in sleep, changes in weight or appetite Skin: negative for changes in color, texture, moles or lesions Eyes: negative for, changes in vision, pain, diplopia HEENT: negative for, change in hearing, pain, discharge, tinnitus, vertigo, voice changes, sore throat, neck masses Pulmonary: negative for, dyspnea, orthopnea, productive cough Cardiac: negative for, palpitations, syncope, pain, discomfort, pressure Gastrointestinal: negative for, dysphagia, nausea, vomiting, jaundice, pain, constipation, diarrhea, hematemesis, hematochezia Genitourinary/Sexual: negative for, dysuria, discharge, hesitancy, nocturia, retention, stones, infections, STD's, incontinence Ob/Gyn: negative for, irregular bleeding, pain Musculoskeletal: hip joints and SI joints with DJD and decreased mobility. Hematology: negative for, easy bruising, bleeding Neurologic/Psych: negative for, headaches, seizures, paralysis, weakness, tremor, change in gait, change in sensation, mood swings, depression, anxiety, change in memory  Objective:  Physical Examination:  BP (!) 164/80   Pulse 89   Temp 97.8 F (36.6 C)   Resp 20   Wt 181 lb 6.4 oz (82.3 kg)   SpO2 100%   BMI 34.28 kg/m    ECOG Performance Status: 1 - Symptomatic but completely ambulatory  General appearance: alert, cooperative, and appears stated age HEENT:PERRLA, neck supple with midline trachea, and thyroid without masses Lymph node survey: non-palpable, axillary, inguinal, supraclavicular Cardiovascular: regular rate and rhythm, no murmurs or gallops Respiratory: normal air entry, lungs clear to auscultation. Abdomen: soft, non-tender, without masses or organomegaly and no hernias Back: inspection of back is normal Extremities: extremities normal, atraumatic, no cyanosis or edema Skin exam - normal coloration and  turgor, no rashes, no suspicious skin lesions noted. Neurological exam reveals alert, oriented, normal speech, no focal findings or movement disorder noted.  Pelvic: exam chaperoned by nurse;  Vulva: right vulva healed now completely, Vagina: normal vagina, some discharge; Adnexa: normal adnexa in size, nontender and no masses; Uterus: uterus is normal size, shape, consistency and nontender; Cervix: anteverted; Rectal: not indicated Groins negative for palpable nodes., Incisions healing well.     Lab Review Lab Results  Component Value Date   WBC 9.4 08/30/2022   HGB 13.5 08/30/2022   HCT 42 08/30/2022   MCV 89.3 06/28/2019   PLT 289 08/30/2022     Chemistry      Component Value Date/Time   NA 142 08/30/2022 0000   K 3.9 08/30/2022 0000   CL 104 08/30/2022 0000   CO2 29 (A) 08/30/2022 0000   BUN 14 08/30/2022 0000   CREATININE 1.1 08/30/2022 0000   CREATININE 2.24 (H) 12/12/2021 1121   GLU 58 08/30/2022 0000      Component Value Date/Time   CALCIUM 9.5 08/30/2022 0000   ALKPHOS 94 08/30/2022 0000   AST 27 08/30/2022 0000   ALT 23 08/30/2022 0000   BILITOT 0.2 12/12/2021 1121   BILITOT <0.2 05/12/2015 1010        Assessment:  Lorraine Jones is a 72 y.o. 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  Underwent right hemi-vulvectomy and bilateral sentinel lymph node mapping and biopsies at [facility]12/5/23.  Final pathology showed 4 mm invasion with clear margins  and negative nodes.  When we saw her two months ago part of the incision had opened and was healing by secondary intention.  Now completely healed.   We recommended no further therapy in view of negative margins and nodes.    Medical co-morbidities complicating care: HTN, AODM/insulin, SI and hip joint DJD with reduced mobility and uses cane, BMI 36.  Plan:   Problem List Items Addressed This Visit       Genitourinary   Vulvar cancer ([place]) - Primary   She will RTC in 6 months or  sooner should any concerning symptoms arise.   The patient's diagnosis, an outline of the further diagnostic and laboratory studies which will be required, the recommendation, and alternatives were discussed.  All questions were answered to the patient's satisfaction.  Mellody Drown, MD  CC:  Steele Sizer, [place] [address] [place],  [place] [address] [phone]

## 2022-12-19 DIAGNOSIS — M1612 Unilateral primary osteoarthritis, left hip: Secondary | ICD-10-CM | POA: Diagnosis not present

## 2022-12-19 DIAGNOSIS — M1611 Unilateral primary osteoarthritis, right hip: Secondary | ICD-10-CM | POA: Diagnosis not present

## 2022-12-23 DIAGNOSIS — M5136 Other intervertebral disc degeneration, lumbar region: Secondary | ICD-10-CM | POA: Diagnosis not present

## 2022-12-23 DIAGNOSIS — G894 Chronic pain syndrome: Secondary | ICD-10-CM | POA: Diagnosis not present

## 2022-12-25 ENCOUNTER — Ambulatory Visit: Payer: Medicare Other

## 2022-12-26 ENCOUNTER — Ambulatory Visit: Payer: Medicare Other | Admitting: Dermatology

## 2023-01-15 DIAGNOSIS — E1065 Type 1 diabetes mellitus with hyperglycemia: Secondary | ICD-10-CM | POA: Diagnosis not present

## 2023-01-20 DIAGNOSIS — M5136 Other intervertebral disc degeneration, lumbar region: Secondary | ICD-10-CM | POA: Diagnosis not present

## 2023-01-20 DIAGNOSIS — G894 Chronic pain syndrome: Secondary | ICD-10-CM | POA: Diagnosis not present

## 2023-02-12 ENCOUNTER — Other Ambulatory Visit: Payer: Self-pay | Admitting: Family Medicine

## 2023-02-14 DIAGNOSIS — E1065 Type 1 diabetes mellitus with hyperglycemia: Secondary | ICD-10-CM | POA: Diagnosis not present

## 2023-02-14 DIAGNOSIS — H40053 Ocular hypertension, bilateral: Secondary | ICD-10-CM | POA: Diagnosis not present

## 2023-02-14 DIAGNOSIS — H40023 Open angle with borderline findings, high risk, bilateral: Secondary | ICD-10-CM | POA: Diagnosis not present

## 2023-02-17 DIAGNOSIS — G894 Chronic pain syndrome: Secondary | ICD-10-CM | POA: Diagnosis not present

## 2023-02-17 DIAGNOSIS — Z79891 Long term (current) use of opiate analgesic: Secondary | ICD-10-CM | POA: Diagnosis not present

## 2023-02-17 DIAGNOSIS — M5136 Other intervertebral disc degeneration, lumbar region: Secondary | ICD-10-CM | POA: Diagnosis not present

## 2023-02-17 DIAGNOSIS — Z5181 Encounter for therapeutic drug level monitoring: Secondary | ICD-10-CM | POA: Diagnosis not present

## 2023-02-18 NOTE — Progress Notes (Unsigned)
Name: Lorraine Jones   MRN: [id number]    DOB: [birth year]   Date:02/19/2023       Progress Note  Subjective  Chief Complaint  Follow Up  HPI  DMII with neuropathy,dyslipidemia and gastroparesis, history of  microalbuminuria  and mild diabetic retinopathy (diagnosed July 2020). She is now under the care of Dr. Gershon Crane, last A1C was down to 7.4 % , she also will start using a CGM - waiting to have a nurse visit for training.   She is now on  Metformin and insulin R and N types Long acting 34 units BID and pre meal is 28 units qac She denies polydipsia or polyphagia, or polyuria , she states neuropathy symptoms under control with gabapentin , occasionally feels very full after a meals but controlled with metoclopramide She is compliant with statins and aspirin daily also on ARB. Last LDL was done March 23 at [facility] and it was at goal at 67    HTN in DM:  she is compliant with medication .No chest pain, dizziness  or palpitation. She is on lower dose of Tribenzor 40/5/25 and Carvedilol, BP is still good, continue current regiment   Chronic Pain: she goes to pain clinic once a month,  taking pain medication as prescribed, she has constipation secondary to narcotics and is taking Miralax otc to control symptoms, she has bowel movements ever other day, but not straining or blood in stools - [place] of 4 .  She has chronic back pain, and pain is on average 5-6/10  it can go up to 1010, described as nagging toothache that radiates sometimes to right lower leg.   Hyperlipidemia: She stopped Crestor and went back on Pravastatin because of side effects.  She denies myalgias secondary to medication. No chest pain. Last LDL was at goal down to 52    Osteopenia: last bone density showed FRAX of 3.3 % for major osteoporotic fracture and 0.5 % for hip fracture. Osteopenia on wrist only, advised to continue high calcium diet and vitamin D supplementation. Unchanged    Obesity: BMI is now below 35, she is gaining  weight again. She has not been to the [facility] lately, she is taking care of her toddler grandchild    Depression in remission  :  she has a long of major depression, part of it secondary to living in pain and inability to do things she likes. She states Effexor and seems to be in remission. She helps take care of her grandson and that has been goof for her mood Unchanged   Eczema: she is applying lotion and prn steroids and it helps    History of Vulvar Cancer: right vulva partial vulvectomy done in Dec 2023 by Dr. Phillis Knack , last visit with him was March 2024 and will go back in 6 months   Patient Active Problem List   Diagnosis Date Noted   Vulvar cancer (HCC) 08/07/2022   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) 04/06/2019   Primary osteoarthritis of both hips 02/06/2018   Hypertension associated with diabetes (HCC) 07/23/2017   Hyperlipidemia due to type 2 diabetes mellitus (HCC) 07/23/2017   Major depression, recurrent (HCC) 06/25/2016   Osteopenia 04/18/2016   Gastroesophageal reflux disease without esophagitis 05/12/2015   Chronic pain 05/12/2015   Hyperlipidemia 05/12/2015   Lumbar post-laminectomy syndrome 02/27/2015   Neuropathy due to secondary diabetes (HCC) 02/27/2015   Spinal stenosis, lumbar region, with neurogenic  claudication 02/27/2015   Sacroiliac joint dysfunction of both sides 02/27/2015   Degenerative joint disease (DJD) of hip 02/27/2015   DDD (degenerative disc disease), cervical 02/27/2015   Bilateral occipital neuralgia 02/27/2015   Chronic constipation 06/02/2014   Hypertension, benign 06/01/2014    Past Surgical History:  Procedure Laterality Date   CHOLECYSTECTOMY  09/30/1994   CYSTOSCOPY WITH URETEROSCOPY Right 09/01/2013   Procedure: CYSTOSCOPY WITH UNROOFING  RIGHT URETEROCELE AND URETERAL STONE REMOVED  WITH GYRUS COLLINS KNIFE. ;  Surgeon: Bjorn Pippin, MD;  Location:  [facility] [place];  Service: Urology;  Laterality: Right;  cysto, right uretersoscopy and stone extraction   collins knife   LUMBAR DISC SURGERY  10/01/1991   L4  --  L5   VULVECTOMY PARTIAL  09/11/2022    Family History  Problem Relation Age of Onset   Diabetes Mother    Heart disease Mother    Hypertension Mother    Breast cancer Neg Hx     Social History   Tobacco Use   Smoking status: Every Day    Packs/day: 0.50    Years: 20.00    Additional pack years: 0.00    Total pack years: 10.00    Types: Cigarettes   Smokeless tobacco: Never   Tobacco comments:    she is cutting down   Substance Use Topics   Alcohol use: No    Alcohol/week: 0.0 standard drinks of alcohol     Current Outpatient Medications:    aspirin EC 81 MG tablet, Take 81 mg by mouth daily., Disp: , Rfl:    Blood Glucose Monitoring Suppl (ONETOUCH VERIO) w/Device KIT, , Disp: , Rfl:    carvedilol (COREG) 3.125 MG tablet, Take 1 tablet (3.125 mg total) by mouth 2 (two) times daily., Disp: 180 tablet, Rfl: 1   cetirizine (ZYRTEC) 10 MG tablet, Take 1 tablet (10 mg total) by mouth at bedtime., Disp: 30 tablet, Rfl: 11   dorzolamide-timolol (COSOPT) 22.3-6.8 MG/ML ophthalmic solution, Place 1 drop into both eyes 2 (two) times daily., Disp: , Rfl:    fentaNYL (DURAGESIC - DOSED MCG/HR) 75 MCG/HR, Apply 2 patches to skin every 2 days if tolerated.   NOTE: Do not apply 100 g per hour fentanyl patch since insurance will not approve 100 g patch for you, Disp: 30 patch, Rfl: 0   glucose blood test strip, 1 strip by Other route 2 (two) times daily., Disp: , Rfl:    metFORMIN (GLUCOPHAGE) 1000 MG tablet, Take 1 tablet (1,000 mg total) by mouth 2 (two) times daily with a meal., Disp: 180 tablet, Rfl: 1   metoCLOPramide (REGLAN) 5 MG tablet, TAKE 1 TABLET BY MOUTH EVERY 6 HOURS AS NEEDED FOR NAUSEA., Disp: 90 tablet, Rfl: 1   Multiple Vitamins-Minerals (MULTIVITAMIN WITH MINERALS) tablet, Take 1 tablet by mouth  daily., Disp: , Rfl:    NOVOLIN N RELION 100 UNIT/ML injection, INJECT 28 UNITS SUBCUTANEOUSLY TWO TIMES DAILY., Disp: , Rfl:    NOVOLIN R RELION 100 UNIT/ML injection, INJECT 20 UNITS SUBCUTANEOUSLY THREE TIMES DAILY BEFORE MEAL(S) (ADD SLIDING SCALE AS NECESSARY), Disp: , Rfl:    Olmesartan-amLODIPine-HCTZ 40-5-25 MG TABS, Take 1 tablet by mouth daily., Disp: 90 tablet, Rfl: 1   Oxycodone HCl 20 MG TABS, LIMIT 1/2 TO 1 TABLET BY MOUTH 3 TO 5 TIMES PER DAY IF TOLERATED *NOTE TABLET IS TWICE AS STRONG*, Disp: , Rfl:    polyethylene glycol (MIRALAX / GLYCOLAX) packet, Take 17 g by mouth daily., Disp: 14  each, Rfl: 0   pravastatin (PRAVACHOL) 40 MG tablet, Take 1 tablet (40 mg total) by mouth daily., Disp: 90 tablet, Rfl: 1   venlafaxine XR (EFFEXOR-XR) 150 MG 24 hr capsule, Take 1 capsule (150 mg total) by mouth daily with breakfast., Disp: 90 capsule, Rfl: 1  Allergies  Allergen Reactions   Diclofenac Sodium Swelling   Naproxen Sodium Swelling   Etodolac Swelling   Gabapentin Swelling   Naproxen Swelling    I personally reviewed active problem list, medication list, allergies, family history, social history, health maintenance with the patient/caregiver today.   ROS  Constitutional: Negative for fever, positive for  weight change.  Respiratory: Negative for cough and shortness of breath.   Cardiovascular: Negative for chest pain or palpitations.  Gastrointestinal: Negative for abdominal pain, no bowel changes.  Musculoskeletal: positive for gait problem and intermittent ankle  joint swelling.  Skin: Negative for rash.  Neurological: Negative for dizziness or headache.  No other specific complaints in a complete review of systems (except as listed in HPI above).   Objective  Vitals:   02/19/23 0933  BP: 130/70  Pulse: 94  Resp: 16  SpO2: 98%  Weight: 184 lb (83.5 kg)  Height: 5\' 1"  (1.549 m)    Body mass index is 34.77 kg/m.  Physical Exam  Constitutional: Patient  appears well-developed and well-nourished. Obese  No distress.  HEENT: head atraumatic, normocephalic, pupils equal and reactive to light,, neck supple Cardiovascular: Normal rate, regular rhythm and normal heart sounds.  No murmur heard. Trace BLE edema. Pulmonary/Chest: Effort normal and breath sounds normal. No respiratory distress. Abdominal: Soft.  There is no tenderness. Psychiatric: Patient has a normal mood and affect. behavior is normal. Judgment and thought content normal.   Recent Results (from the past 2160 hour(s))  Basic metabolic panel     Status: Abnormal   Collection Time: 12/17/22 12:00 AM  Result Value Ref Range   Glucose 123    BUN 12 4 - 21   CO2 30 (A) 13 - 22   Creatinine 1.0 0.5 - 1.1   Potassium 3.9 3.5 - 5.1 mEq/L   Sodium 140 137 - 147   Chloride 104 99 - 108  Comprehensive metabolic panel     Status: None   Collection Time: 12/17/22 12:00 AM  Result Value Ref Range   eGFR 60    Calcium 9.6 8.7 - 10.7   Albumin 4.3 3.5 - 5.0  Lipid panel     Status: Abnormal   Collection Time: 12/17/22 12:00 AM  Result Value Ref Range   Triglycerides 240 (A) 40 - 160   Cholesterol 170 0 - 200   HDL 70 35 - 70   LDL Cholesterol 52   Hepatic function panel     Status: None   Collection Time: 12/17/22 12:00 AM  Result Value Ref Range   Alkaline Phosphatase 123 25 - 125   ALT 17 7 - 35 U/L   AST 16 13 - 35   Bilirubin, Total 0.3   Vitamin B12     Status: None   Collection Time: 12/17/22 12:00 AM  Result Value Ref Range   Vitamin B-12 209   Hemoglobin A1c     Status: None   Collection Time: 12/17/22 12:00 AM  Result Value Ref Range   Hemoglobin A1C 7.4   TSH     Status: None   Collection Time: 12/17/22 12:00 AM  Result Value Ref Range   TSH 2.47 0.41 -  5.90     PHQ2/9:    02/19/2023    9:33 AM 10/21/2022   10:20 AM 09/27/2022   12:17 PM 08/29/2022    8:40 AM 07/17/2022    8:29 AM  Depression screen PHQ 2/9  Decreased Interest 0 0 0 0 0  Down,  Depressed, Hopeless 0 0 0 0 0  PHQ - 2 Score 0 0 0 0 0  Altered sleeping 0 0 0 0 0  Tired, decreased energy 0 0 0 0 0  Change in appetite 0 0 0 0 0  Feeling bad or failure about yourself  0 0 0 0 0  Trouble concentrating 0 0 0 0 0  Moving slowly or fidgety/restless 0 0 0 0 0  Suicidal thoughts 0 0 0 0 0  PHQ-9 Score 0 0 0 0 0  Difficult doing work/chores     Not difficult at all    phq 9 is negative   Fall Risk:    02/19/2023    9:33 AM 10/21/2022   10:20 AM 09/27/2022   12:17 PM 08/29/2022    8:43 AM 07/17/2022    8:28 AM  Fall Risk   Falls in the past year? 0 0 0 0 0  Number falls in past yr: 0  0  0  Injury with Fall? 0  0  0  Risk for fall due to : No Fall Risks No Fall Risks No Fall Risks No Fall Risks   Follow up Falls prevention discussed Falls prevention discussed;Falls evaluation completed;Education provided Falls prevention discussed Falls prevention discussed;Education provided Falls evaluation completed      Functional Status Survey: Is the patient deaf or have difficulty hearing?: No Does the patient have difficulty seeing, even when wearing glasses/contacts?: No Does the patient have difficulty concentrating, remembering, or making decisions?: No Does the patient have difficulty walking or climbing stairs?: Yes Does the patient have difficulty dressing or bathing?: No Does the patient have difficulty doing errands alone such as visiting a doctor's office or shopping?: No    Assessment & Plan  1. Dyslipidemia associated with type 2 diabetes mellitus (HCC)  - pravastatin (PRAVACHOL) 40 MG tablet; Take 1 tablet (40 mg total) by mouth daily.  Dispense: 90 tablet; Refill: 1  2. Hypertension associated with diabetes (HCC)  - Olmesartan-amLODIPine-HCTZ 40-5-25 MG TABS; Take 1 tablet by mouth daily.  Dispense: 90 tablet; Refill: 1 - carvedilol (COREG) 3.125 MG tablet; Take 1 tablet (3.125 mg total) by mouth 2 (two) times daily.  Dispense: 180 tablet; Refill:  1  3. Morbid obesity (HCC)  Discussed with the patient the risk posed by an increased BMI. Discussed importance of portion control, calorie counting and at least 150 minutes of physical activity weekly. Avoid sweet beverages and drink more water. Eat at least 6 servings of fruit and vegetables daily    4. Major depression in remission (HCC)  - venlafaxine XR (EFFEXOR-XR) 150 MG 24 hr capsule; Take 1 capsule (150 mg total) by mouth daily with breakfast.  Dispense: 90 capsule; Refill: 1  5. Hypertension, benign  - Olmesartan-amLODIPine-HCTZ 40-5-25 MG TABS; Take 1 tablet by mouth daily.  Dispense: 90 tablet; Refill: 1 - carvedilol (COREG) 3.125 MG tablet; Take 1 tablet (3.125 mg total) by mouth 2 (two) times daily.  Dispense: 180 tablet; Refill: 1  6. History of cancer of vulva  Up to date with follow ups  7. Osteopenia after menopause  Discussed results   8. Chronic pain syndrome

## 2023-02-19 ENCOUNTER — Ambulatory Visit (INDEPENDENT_AMBULATORY_CARE_PROVIDER_SITE_OTHER): Payer: Medicare Other | Admitting: Family Medicine

## 2023-02-19 ENCOUNTER — Encounter: Payer: Self-pay | Admitting: Family Medicine

## 2023-02-19 VITALS — BP 130/70 | HR 94 | Resp 16 | Ht 61.0 in | Wt 184.0 lb

## 2023-02-19 DIAGNOSIS — G894 Chronic pain syndrome: Secondary | ICD-10-CM

## 2023-02-19 DIAGNOSIS — E1169 Type 2 diabetes mellitus with other specified complication: Secondary | ICD-10-CM

## 2023-02-19 DIAGNOSIS — Z794 Long term (current) use of insulin: Secondary | ICD-10-CM

## 2023-02-19 DIAGNOSIS — I152 Hypertension secondary to endocrine disorders: Secondary | ICD-10-CM | POA: Diagnosis not present

## 2023-02-19 DIAGNOSIS — I10 Essential (primary) hypertension: Secondary | ICD-10-CM

## 2023-02-19 DIAGNOSIS — Z7984 Long term (current) use of oral hypoglycemic drugs: Secondary | ICD-10-CM

## 2023-02-19 DIAGNOSIS — F325 Major depressive disorder, single episode, in full remission: Secondary | ICD-10-CM | POA: Diagnosis not present

## 2023-02-19 DIAGNOSIS — E1159 Type 2 diabetes mellitus with other circulatory complications: Secondary | ICD-10-CM

## 2023-02-19 DIAGNOSIS — E785 Hyperlipidemia, unspecified: Secondary | ICD-10-CM | POA: Diagnosis not present

## 2023-02-19 DIAGNOSIS — M8580 Other specified disorders of bone density and structure, unspecified site: Secondary | ICD-10-CM | POA: Diagnosis not present

## 2023-02-19 DIAGNOSIS — Z8544 Personal history of malignant neoplasm of other female genital organs: Secondary | ICD-10-CM

## 2023-02-19 MED ORDER — OLMESARTAN-AMLODIPINE-HCTZ 40-5-25 MG PO TABS
1.0000 | ORAL_TABLET | Freq: Every day | ORAL | 1 refills | Status: DC
Start: 2023-02-19 — End: 2023-07-22

## 2023-02-19 MED ORDER — PRAVASTATIN SODIUM 40 MG PO TABS: 40.0000 mg | ORAL_TABLET | Freq: Every day | ORAL | 1 refills | Status: DC

## 2023-02-19 MED ORDER — VENLAFAXINE HCL ER 150 MG PO CP24: 150.0000 mg | ORAL_CAPSULE | Freq: Every day | ORAL | 1 refills | Status: DC

## 2023-02-19 MED ORDER — CARVEDILOL 3.125 MG PO TABS: 3.1250 mg | ORAL_TABLET | Freq: Two times a day (BID) | ORAL | 1 refills | Status: DC

## 2023-03-17 DIAGNOSIS — E1065 Type 1 diabetes mellitus with hyperglycemia: Secondary | ICD-10-CM | POA: Diagnosis not present

## 2023-03-17 DIAGNOSIS — M179 Osteoarthritis of knee, unspecified: Secondary | ICD-10-CM | POA: Diagnosis not present

## 2023-03-17 DIAGNOSIS — M5136 Other intervertebral disc degeneration, lumbar region: Secondary | ICD-10-CM | POA: Diagnosis not present

## 2023-03-27 DIAGNOSIS — M1612 Unilateral primary osteoarthritis, left hip: Secondary | ICD-10-CM | POA: Diagnosis not present

## 2023-03-27 DIAGNOSIS — F17210 Nicotine dependence, cigarettes, uncomplicated: Secondary | ICD-10-CM | POA: Diagnosis not present

## 2023-03-27 DIAGNOSIS — M1611 Unilateral primary osteoarthritis, right hip: Secondary | ICD-10-CM | POA: Diagnosis not present

## 2023-04-13 ENCOUNTER — Other Ambulatory Visit: Payer: Self-pay | Admitting: Family Medicine

## 2023-04-14 ENCOUNTER — Other Ambulatory Visit: Payer: Self-pay

## 2023-04-14 DIAGNOSIS — M5136 Other intervertebral disc degeneration, lumbar region: Secondary | ICD-10-CM | POA: Diagnosis not present

## 2023-04-14 DIAGNOSIS — G894 Chronic pain syndrome: Secondary | ICD-10-CM | POA: Diagnosis not present

## 2023-04-14 DIAGNOSIS — M179 Osteoarthritis of knee, unspecified: Secondary | ICD-10-CM | POA: Diagnosis not present

## 2023-04-14 MED ORDER — METOCLOPRAMIDE HCL 5 MG PO TABS: 5.0000 mg | ORAL_TABLET | Freq: Four times a day (QID) | ORAL | 1 refills | Status: DC | PRN

## 2023-04-16 DIAGNOSIS — E1065 Type 1 diabetes mellitus with hyperglycemia: Secondary | ICD-10-CM | POA: Diagnosis not present

## 2023-05-12 DIAGNOSIS — M169 Osteoarthritis of hip, unspecified: Secondary | ICD-10-CM | POA: Diagnosis not present

## 2023-05-12 DIAGNOSIS — M791 Myalgia, unspecified site: Secondary | ICD-10-CM | POA: Diagnosis not present

## 2023-05-12 DIAGNOSIS — M179 Osteoarthritis of knee, unspecified: Secondary | ICD-10-CM | POA: Diagnosis not present

## 2023-05-12 DIAGNOSIS — M5136 Other intervertebral disc degeneration, lumbar region: Secondary | ICD-10-CM | POA: Diagnosis not present

## 2023-05-17 DIAGNOSIS — E1065 Type 1 diabetes mellitus with hyperglycemia: Secondary | ICD-10-CM | POA: Diagnosis not present

## 2023-06-09 ENCOUNTER — Other Ambulatory Visit: Payer: Self-pay | Admitting: Family Medicine

## 2023-06-09 DIAGNOSIS — M179 Osteoarthritis of knee, unspecified: Secondary | ICD-10-CM | POA: Diagnosis not present

## 2023-06-09 DIAGNOSIS — M5136 Other intervertebral disc degeneration, lumbar region: Secondary | ICD-10-CM | POA: Diagnosis not present

## 2023-06-09 DIAGNOSIS — Z79891 Long term (current) use of opiate analgesic: Secondary | ICD-10-CM | POA: Diagnosis not present

## 2023-06-09 DIAGNOSIS — G894 Chronic pain syndrome: Secondary | ICD-10-CM | POA: Diagnosis not present

## 2023-06-09 DIAGNOSIS — M791 Myalgia, unspecified site: Secondary | ICD-10-CM | POA: Diagnosis not present

## 2023-06-18 ENCOUNTER — Ambulatory Visit: Payer: Medicare Other

## 2023-06-19 DIAGNOSIS — E785 Hyperlipidemia, unspecified: Secondary | ICD-10-CM | POA: Diagnosis not present

## 2023-06-19 DIAGNOSIS — E1165 Type 2 diabetes mellitus with hyperglycemia: Secondary | ICD-10-CM | POA: Diagnosis not present

## 2023-06-19 DIAGNOSIS — I152 Hypertension secondary to endocrine disorders: Secondary | ICD-10-CM | POA: Diagnosis not present

## 2023-06-19 DIAGNOSIS — E1169 Type 2 diabetes mellitus with other specified complication: Secondary | ICD-10-CM | POA: Diagnosis not present

## 2023-06-19 DIAGNOSIS — Z794 Long term (current) use of insulin: Secondary | ICD-10-CM | POA: Diagnosis not present

## 2023-06-19 DIAGNOSIS — E538 Deficiency of other specified B group vitamins: Secondary | ICD-10-CM | POA: Diagnosis not present

## 2023-06-19 DIAGNOSIS — E1159 Type 2 diabetes mellitus with other circulatory complications: Secondary | ICD-10-CM | POA: Diagnosis not present

## 2023-06-19 LAB — VITAMIN B12: Vitamin B-12: >1500

## 2023-06-19 LAB — PROTEIN / CREATININE RATIO, URINE
Albumin, U: 101
Creatinine, Urine: 155.6

## 2023-06-19 LAB — HEMOGLOBIN A1C: Hemoglobin A1C: 7.3

## 2023-06-19 LAB — MICROALBUMIN / CREATININE URINE RATIO: Microalb Creat Ratio: 64.9

## 2023-06-25 ENCOUNTER — Ambulatory Visit: Payer: Medicare Other

## 2023-07-02 ENCOUNTER — Ambulatory Visit: Payer: Medicare Other

## 2023-07-07 DIAGNOSIS — G894 Chronic pain syndrome: Secondary | ICD-10-CM | POA: Diagnosis not present

## 2023-07-07 DIAGNOSIS — M5135 Other intervertebral disc degeneration, thoracolumbar region: Secondary | ICD-10-CM | POA: Diagnosis not present

## 2023-07-07 DIAGNOSIS — M791 Myalgia, unspecified site: Secondary | ICD-10-CM | POA: Diagnosis not present

## 2023-07-16 ENCOUNTER — Inpatient Hospital Stay: Payer: Medicare Other | Attending: Hematology and Oncology | Admitting: Obstetrics and Gynecology

## 2023-07-16 VITALS — BP 125/61 | HR 85 | Temp 98.6°F | Resp 19 | Wt 170.1 lb

## 2023-07-16 DIAGNOSIS — C519 Malignant neoplasm of vulva, unspecified: Secondary | ICD-10-CM

## 2023-07-16 DIAGNOSIS — Z8544 Personal history of malignant neoplasm of other female genital organs: Secondary | ICD-10-CM | POA: Diagnosis present

## 2023-07-16 NOTE — Progress Notes (Signed)
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  Subjective:  Lorraine Jones is a 72 y.o. female who is seen in consultation from Dr. Logan Bores for vulvar cancer.   09/03/22 - Underwent right hemi-vulvectomy and bilateral sentinel lymph node mapping and biopsies at [facility].  Final pathology showed clear margins and negative nodes.   When we saw her two months ago part of the incision had opened and was healing by secondary intention.   No complaints today.    Gyn Oncology history Seen by Dr Logan Bores 07/24/22.   Vulvar itching and irritation and noticed a blister on her labia.  She went to her PCP who did HSV and RPR which have proven to be negative.  She had some discharge. She reports no previous issues with abnormal Pap smears. Exam: Right labial lesion with leukoplakia and discolored draining area.  Rugated and thickened.   A.   VULVA, BIOPSY:  -      INVASIVE SQUAMOUS CELL CARCINOMA, WELL-DIFFERENTIATED.   B.   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   Some SI and Hip DJD and walks with cane.    09/03/22 - Underwent right hemi-vulvectomy and bilateral sentinel lymph node mapping and biopsies at [facility].  Final pathology showed clear margins and negative nodes.    A. 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 Depth of invasion: 4 mm. Margins of resection are negative for invasive and in situ carcinoma, see additional final margins below. B. Right vulvar medial margin, excision: Skin, negative for malignancy. C. Right lateral vulvar margin, excision: Skin, negative for malignancy. D. Right inguinal sentinel lymph node, green and hot, lymphadenectomy: One lymph node, negative for malignancy (0/1). E. Left inguinal sentinel lymph node, green  and blue, lymphadenectomy: Two lymph nodes, negative for malignancy (0/2).  TUMOR    Tumor Focality:    Unifocal     Tumor Site:    Right vulva       :    Labium majus       :    Labium minus     Tumor Size:    Greatest Dimension (Centimeters): 3 cm     Histologic Type:    Squamous cell carcinoma, HPV-associated     Histologic Grade:    G2, moderately differentiated     Depth of Tumor Invasion:    4 mm     Tumor Border:    Infiltrating     Other Tissue / Organ Involvement:    Not applicable     Lymphovascular Invasion:    Not identified   MARGINS    Margin Status for Invasive Carcinoma:    All margins negative for invasive carcinoma       Closest Margin(s) to Invasive Carcinoma:    Deep: At 6:00 margin       Distance from Invasive Carcinoma to Closest Margin:    3 mm     Margin Status for HSIL (VIN2-3) or dVIN:    All margins negative for high-grade squamous intraepithelial lesion (HSIL) and / or differentiated vulvar intraepithelial neoplasia (dVIN)    Problem List: Patient Active Problem List   Diagnosis Date Noted   Vulvar cancer (HCC) 08/07/2022   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) 04/06/2019  Primary osteoarthritis of both hips 02/06/2018   Hypertension associated with diabetes (HCC) 07/23/2017   Hyperlipidemia due to type 2 diabetes mellitus (HCC) 07/23/2017   Major depression, recurrent (HCC) 06/25/2016   Osteopenia 04/18/2016   Gastroesophageal reflux disease without esophagitis 05/12/2015   Chronic pain 05/12/2015   Hyperlipidemia 05/12/2015   Lumbar post-laminectomy syndrome 02/27/2015   Neuropathy due to secondary diabetes (HCC) 02/27/2015   Spinal stenosis, lumbar region, with neurogenic claudication 02/27/2015   Sacroiliac joint dysfunction of both sides 02/27/2015   Degenerative joint disease (DJD) of hip 02/27/2015   DDD (degenerative disc disease), cervical 02/27/2015    Bilateral occipital neuralgia 02/27/2015   Chronic constipation 06/02/2014    Past Medical History: Past Medical History:  Diagnosis Date   Arthritis    Chronic low back pain    Depression    Diabetes mellitus    Hyperlipidemia    Hypertension     Past Surgical History: Past Surgical History:  Procedure Laterality Date   CHOLECYSTECTOMY  09/30/1994   CYSTOSCOPY WITH URETEROSCOPY Right 09/01/2013   Procedure: CYSTOSCOPY WITH UNROOFING  RIGHT URETEROCELE AND URETERAL STONE REMOVED  WITH GYRUS COLLINS KNIFE. ;  Surgeon: Bjorn Pippin, MD;  Location: [facility];  Service: Urology;  Laterality: Right;  cysto, right uretersoscopy and stone extraction   collins knife   LUMBAR DISC SURGERY  10/01/1991   L4  --  L5   VULVECTOMY PARTIAL  09/11/2022      OB History:  OB History  Gravida Para Term Preterm AB Living  3 3 3     3   SAB IAB Ectopic Multiple Live Births          3    # Outcome Date GA Lbr Len/2nd Weight Sex Type Anes PTL Lv  3 Term 1982     Vag-Spont   LIV  2 Term 1974     Vag-Spont   LIV  1 Term 27     Vag-Spont   LIV    Family History: Family History  Problem Relation Age of Onset   Diabetes Mother    Heart disease Mother    Hypertension Mother    Breast cancer Neg Hx     Social History: Social History   Socioeconomic History   Marital status: Married    Spouse name: Not on file   Number of children: 3   Years of education: Not on file   Highest education level: 12th grade  Occupational History   Occupation: retired  Tobacco Use   Smoking status: Every Day    Current packs/day: 0.50    Average packs/day: 0.5 packs/day for 20.0 years (10.0 ttl pk-yrs)    Types: Cigarettes   Smokeless tobacco: Never   Tobacco comments:    she is cutting down   Vaping Use   Vaping status: Never Used  Substance and Sexual Activity   Alcohol use: No    Alcohol/week: 0.0 standard drinks of alcohol   Drug use: No   Sexual activity: Not Currently     Partners: Male    Birth control/protection: Post-menopausal  Other Topics Concern   Not on file  Social History Narrative   Not on file   Social Determinants of Health   Financial Resource Strain: Low Risk  (08/29/2022)   Overall Financial Resource Strain (CARDIA)    Difficulty of Paying Living Expenses: Not very hard  Food Insecurity: No Food Insecurity (08/29/2022)   Hunger Vital Sign    Worried About Running  Out of Food in the Last Year: Never true    Ran Out of Food in the Last Year: Never true  Transportation Needs: No Transportation Needs (08/29/2022)   PRAPARE - Administrator, Civil Service (Medical): No    Lack of Transportation (Non-Medical): No  Physical Activity: Insufficiently Active (08/29/2022)   Exercise Vital Sign    Days of Exercise per Week: 3 days    Minutes of Exercise per Session: 20 min  Stress: No Stress Concern Present (08/29/2022)   [employer] of Occupational Health - Occupational Stress Questionnaire    Feeling of Stress : Only a little  Social Connections: Socially Integrated (08/29/2022)   Social Connection and Isolation Panel [NHANES]    Frequency of Communication with Friends and Family: More than three times a week    Frequency of Social Gatherings with Friends and Family: More than three times a week    Attends Religious Services: More than 4 times per year    Active Member of [employer] or Organizations: Yes    Attends Banker Meetings: More than 4 times per year    Marital Status: Married  Catering manager Violence: Not At Risk (08/29/2022)   Humiliation, Afraid, Rape, and Kick questionnaire    Fear of Current or Ex-Partner: No    Emotionally Abused: No    Physically Abused: No    Sexually Abused: No    Allergies: Allergies  Allergen Reactions   Diclofenac Sodium Swelling   Naproxen Sodium Swelling   Etodolac Swelling   Gabapentin Swelling   Naproxen Swelling    Current Medications: Current Outpatient  Medications  Medication Sig Dispense Refill   aspirin EC 81 MG tablet Take 81 mg by mouth daily.     Blood Glucose Monitoring Suppl (ONETOUCH VERIO) w/Device KIT      carvedilol (COREG) 3.125 MG tablet Take 1 tablet (3.125 mg total) by mouth 2 (two) times daily. 180 tablet 1   cetirizine (ZYRTEC) 10 MG tablet Take 1 tablet (10 mg total) by mouth at bedtime. 30 tablet 11   dorzolamide-timolol (COSOPT) 22.3-6.8 MG/ML ophthalmic solution Place 1 drop into both eyes 2 (two) times daily.     fentaNYL (DURAGESIC - DOSED MCG/HR) 75 MCG/HR Apply 2 patches to skin every 2 days if tolerated.   NOTE: Do not apply 100 g per hour fentanyl patch since insurance will not approve 100 g patch for you 30 patch 0   glucose blood test strip 1 strip by Other route 2 (two) times daily.     metFORMIN (GLUCOPHAGE) 1000 MG tablet Take 1 tablet (1,000 mg total) by mouth 2 (two) times daily with a meal. 180 tablet 1   metoCLOPramide (REGLAN) 5 MG tablet TAKE 1 TABLET (5 MG TOTAL) BY MOUTH EVERY 6 (SIX) HOURS AS NEEDED. FOR NAUSEA 90 tablet 1   Multiple Vitamins-Minerals (MULTIVITAMIN WITH MINERALS) tablet Take 1 tablet by mouth daily.     NOVOLIN N RELION 100 UNIT/ML injection INJECT 28 UNITS SUBCUTANEOUSLY TWO TIMES DAILY.     NOVOLIN R RELION 100 UNIT/ML injection INJECT 20 UNITS SUBCUTANEOUSLY THREE TIMES DAILY BEFORE MEAL(S) (ADD SLIDING SCALE AS NECESSARY)     Olmesartan-amLODIPine-HCTZ 40-5-25 MG TABS Take 1 tablet by mouth daily. 90 tablet 1   Oxycodone HCl 20 MG TABS LIMIT 1/2 TO 1 TABLET BY MOUTH 3 TO 5 TIMES PER DAY IF TOLERATED *NOTE TABLET IS TWICE AS STRONG*     polyethylene glycol (MIRALAX / GLYCOLAX) packet Take 17 g  by mouth daily. 14 each 0   pravastatin (PRAVACHOL) 40 MG tablet Take 1 tablet (40 mg total) by mouth daily. 90 tablet 1   venlafaxine XR (EFFEXOR-XR) 150 MG 24 hr capsule Take 1 capsule (150 mg total) by mouth daily with breakfast. 90 capsule 1   No current facility-administered medications  for this visit.   Review of Systems General: negative for, fevers, chills, fatigue, changes in sleep, changes in weight or appetite Skin: negative for changes in color, texture, moles or lesions Eyes: negative for, changes in vision, pain, diplopia HEENT: negative for, change in hearing, pain, discharge, tinnitus, vertigo, voice changes, sore throat, neck masses Pulmonary: negative for, dyspnea, orthopnea, productive cough Cardiac: negative for, palpitations, syncope, pain, discomfort, pressure Gastrointestinal: negative for, dysphagia, nausea, vomiting, jaundice, pain, constipation, diarrhea, hematemesis, hematochezia Genitourinary/Sexual: negative for, dysuria, discharge, hesitancy, nocturia, retention, stones, infections, STD's, incontinence Ob/Gyn: negative for, irregular bleeding, pain Musculoskeletal: hip joints and SI joints with DJD and decreased mobility. Hematology: negative for, easy bruising, bleeding Neurologic/Psych: negative for, headaches, seizures, paralysis, weakness, tremor, change in gait, change in sensation, mood swings, depression, anxiety, change in memory  Objective:  Physical Examination:  BP 125/61   Pulse 85   Temp 98.6 F (37 C)   Resp 19   Wt 170 lb 1.6 oz (77.2 kg)   SpO2 99%   BMI 32.14 kg/m    ECOG Performance Status: 1 - Symptomatic but completely ambulatory  General appearance: alert, cooperative, and appears stated age HEENT:PERRLA, neck supple with midline trachea, and thyroid without masses Lymph node survey: non-palpable, axillary, inguinal, supraclavicular Cardiovascular: regular rate and rhythm, no murmurs or gallops Respiratory: normal air entry, lungs clear to auscultation. Abdomen: soft, non-tender, without masses or organomegaly and no hernias Back: inspection of back is normal Extremities: extremities normal, atraumatic, no cyanosis or edema. Limited mobility in hips. Skin exam - normal coloration and turgor, no rashes, no  suspicious skin lesions noted. Neurological exam reveals alert, oriented, normal speech, no focal findings or movement disorder noted.  Pelvic: exam chaperoned by nurse;  Vulva: right vulva healed now completely, Vagina: normal vagina, some discharge; Adnexa: normal adnexa in size, nontender and no masses; Uterus: uterus is normal size, shape, consistency and nontender; Cervix: anteverted; Rectal: not indicated Groins negative for palpable nodes., Incisions healing well.     Lab Review Lab Results  Component Value Date   WBC 9.4 08/30/2022   HGB 13.5 08/30/2022   HCT 42 08/30/2022   MCV 89.3 06/28/2019   PLT 289 08/30/2022     Chemistry      Component Value Date/Time   NA 140 12/17/2022 0000   K 3.9 12/17/2022 0000   CL 104 12/17/2022 0000   CO2 30 (A) 12/17/2022 0000   BUN 12 12/17/2022 0000   CREATININE 1.0 12/17/2022 0000   CREATININE 2.24 (H) 12/12/2021 1121   GLU 123 12/17/2022 0000      Component Value Date/Time   CALCIUM 9.6 12/17/2022 0000   ALKPHOS 123 12/17/2022 0000   AST 16 12/17/2022 0000   ALT 17 12/17/2022 0000   BILITOT 0.2 12/12/2021 1121   BILITOT <0.2 05/12/2015 1010        Assessment:  Lorraine Jones is a 72 y.o. 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  Underwent right hemi-vulvectomy and bilateral sentinel lymph node mapping and biopsies at [facility]12/5/23.  Final pathology showed 4 mm invasion with clear margins and negative nodes.  Part of  the incision opened and healed by secondary intention.  NED  We recommended no further therapy in view of negative margins and nodes.    Medical co-morbidities complicating care: HTN, AODM/insulin, SI and hip joint DJD with reduced mobility and uses cane, BMI 36.  Plan:   Problem List Items Addressed This Visit       Genitourinary   Vulvar cancer (HCC) - Primary    She will RTC in 6 months or sooner should any concerning symptoms arise.   She will be having hip  replacement surgery.   The patient's diagnosis, an outline of the further diagnostic and laboratory studies which will be required, the recommendation, and alternatives were discussed.  All questions were answered to the patient's satisfaction.  Leida Lauth, MD  CC:  Alba Cory, MD [address] [place],  [place] [address] [phone]

## 2023-07-21 ENCOUNTER — Other Ambulatory Visit: Payer: Self-pay | Admitting: Family Medicine

## 2023-07-21 DIAGNOSIS — Z1231 Encounter for screening mammogram for malignant neoplasm of breast: Secondary | ICD-10-CM

## 2023-07-21 NOTE — Progress Notes (Unsigned)
Name: Lorraine Jones   MRN: [id number]    DOB: Feb 03, 1951   Date:07/22/2023       Progress Note  Subjective  Chief Complaint  Follow Up  HPI  DMII with neuropathy,dyslipidemia and gastroparesis, history of  microalbuminuria  and mild diabetic retinopathy (diagnosed July 2020). She is now under the care of Dr. Gershon Crane, last A1C was done 06/2023 and it was   was down to 7.3 % , last urine micro was 64.9 , she also will start using a CGM - 80 % in range on her app, 14 % low in the past 90 days - episodes happening the night . Discussed getting a snack before bed   She is now on Metformin and insulin N  34 units BID and pre meal is 28 units qac regular She denies polydipsia or polyphagia, or polyuria , she states neuropathy symptoms under control with gabapentin , occasionally feels very full after a meals but controlled with metoclopramide She is compliant with statins and aspirin daily also on ARB.    HTN in DM:  she is compliant with medication .No chest pain, dizziness  or palpitation. She is on lower dose of Tribenzor 40/5/25 and Carvedilol, BP has well controlled.   Chronic Pain: she goes to pain clinic once a month,  taking pain medication as prescribed, she has constipation secondary to narcotics and is taking Miralax otc to control symptoms, she has bowel movements ever other day, but not straining or blood in stools [place] 3 and 4.  She has chronic back pain, and pain is on average 6/10  it can go up to 10/10, described as nagging toothache that radiates sometimes to right lower leg.   OA both hips: she was advised by ortho to have hip replacement but she is not ready yet   Hyperlipidemia: She stopped Crestor and went back on Pravastatin because of side effects.  She denies myalgias secondary to medication.  Last LDL was at goal down to 52 , continue current regiment    Osteopenia: last bone density showed FRAX of 3.3 % for major osteoporotic fracture and 0.5 % for hip fracture.  Osteopenia on wrist only, advised to continue high calcium diet and vitamin D supplementation. Drinking Glucerna for breakfast   Obesity: BMI is now below 35 . She lost 14 lbs since last visit, she has been more active taking care of her grandchild, also DM has been better controlled.     Depression in remission  :  she has a long of major depression, part of it secondary to living in pain and inability to do things she likes. She states Effexor and seems to be in remission. She helps take care of her grandson and that has been great  for her mood   Eczema: she is applying lotion and prn steroids and it helps  Unchanged   History of Vulvar Cancer: right vulva partial vulvectomy done in Dec 2023 by Dr. Phillis Knack , last visit with him was October 2024 and will go back in 6 months   Patient Active Problem List   Diagnosis Date Noted   Vulvar cancer (HCC) 08/07/2022   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) 04/06/2019   Primary osteoarthritis of both hips 02/06/2018   Hypertension associated with diabetes (HCC) 07/23/2017   Hyperlipidemia due to type 2 diabetes mellitus (HCC) 07/23/2017   Major depression, recurrent (HCC) 06/25/2016   Osteopenia 04/18/2016  Gastroesophageal reflux disease without esophagitis 05/12/2015   Chronic pain 05/12/2015   Hyperlipidemia 05/12/2015   Lumbar post-laminectomy syndrome 02/27/2015   Neuropathy due to secondary diabetes (HCC) 02/27/2015   Spinal stenosis, lumbar region, with neurogenic claudication 02/27/2015   Sacroiliac joint dysfunction of both sides 02/27/2015   Degenerative joint disease (DJD) of hip 02/27/2015   DDD (degenerative disc disease), cervical 02/27/2015   Bilateral occipital neuralgia 02/27/2015   Chronic constipation 06/02/2014    Past Surgical History:  Procedure Laterality Date   CHOLECYSTECTOMY  09/30/1994   CYSTOSCOPY WITH URETEROSCOPY Right  09/01/2013   Procedure: CYSTOSCOPY WITH UNROOFING  RIGHT URETEROCELE AND URETERAL STONE REMOVED  WITH GYRUS COLLINS KNIFE. ;  Surgeon: Bjorn Pippin, MD;  Location: [facility];  Service: Urology;  Laterality: Right;  cysto, right uretersoscopy and stone extraction   collins knife   LUMBAR DISC SURGERY  10/01/1991   L4  --  L5   VULVECTOMY PARTIAL  09/11/2022    Family History  Problem Relation Age of Onset   Diabetes Mother    Heart disease Mother    Hypertension Mother    Breast cancer Neg Hx     Social History   Tobacco Use   Smoking status: Every Day    Current packs/day: 0.50    Average packs/day: 0.5 packs/day for 20.0 years (10.0 ttl pk-yrs)    Types: Cigarettes   Smokeless tobacco: Never   Tobacco comments:    she is cutting down   Substance Use Topics   Alcohol use: No    Alcohol/week: 0.0 standard drinks of alcohol     Current Outpatient Medications:    aspirin EC 81 MG tablet, Take 81 mg by mouth daily., Disp: , Rfl:    Blood Glucose Monitoring Suppl (ONETOUCH VERIO) w/Device KIT, , Disp: , Rfl:    carvedilol (COREG) 3.125 MG tablet, Take 1 tablet (3.125 mg total) by mouth 2 (two) times daily., Disp: 180 tablet, Rfl: 1   cetirizine (ZYRTEC) 10 MG tablet, Take 1 tablet (10 mg total) by mouth at bedtime., Disp: 30 tablet, Rfl: 11   dorzolamide-timolol (COSOPT) 22.3-6.8 MG/ML ophthalmic solution, Place 1 drop into both eyes 2 (two) times daily., Disp: , Rfl:    fentaNYL (DURAGESIC - DOSED MCG/HR) 75 MCG/HR, Apply 2 patches to skin every 2 days if tolerated.   NOTE: Do not apply 100 g per hour fentanyl patch since insurance will not approve 100 g patch for you, Disp: 30 patch, Rfl: 0   glucose blood test strip, 1 strip by Other route 2 (two) times daily., Disp: , Rfl:    metFORMIN (GLUCOPHAGE) 1000 MG tablet, Take 1 tablet (1,000 mg total) by mouth 2 (two) times daily with a meal., Disp: 180 tablet, Rfl: 1   metoCLOPramide (REGLAN) 5 MG tablet, TAKE 1  TABLET (5 MG TOTAL) BY MOUTH EVERY 6 (SIX) HOURS AS NEEDED. FOR NAUSEA, Disp: 90 tablet, Rfl: 1   Multiple Vitamins-Minerals (MULTIVITAMIN WITH MINERALS) tablet, Take 1 tablet by mouth daily., Disp: , Rfl:    NOVOLIN N RELION 100 UNIT/ML injection, INJECT 28 UNITS SUBCUTANEOUSLY TWO TIMES DAILY., Disp: , Rfl:    NOVOLIN R RELION 100 UNIT/ML injection, INJECT 20 UNITS SUBCUTANEOUSLY THREE TIMES DAILY BEFORE MEAL(S) (ADD SLIDING SCALE AS NECESSARY), Disp: , Rfl:    Olmesartan-amLODIPine-HCTZ 40-5-25 MG TABS, Take 1 tablet by mouth daily., Disp: 90 tablet, Rfl: 1   Oxycodone HCl 20 MG TABS, LIMIT 1/2 TO 1 TABLET BY MOUTH 3 TO  5 TIMES PER DAY IF TOLERATED *NOTE TABLET IS TWICE AS STRONG*, Disp: , Rfl:    polyethylene glycol (MIRALAX / GLYCOLAX) packet, Take 17 g by mouth daily., Disp: 14 each, Rfl: 0   pravastatin (PRAVACHOL) 40 MG tablet, Take 1 tablet (40 mg total) by mouth daily., Disp: 90 tablet, Rfl: 1   venlafaxine XR (EFFEXOR-XR) 150 MG 24 hr capsule, Take 1 capsule (150 mg total) by mouth daily with breakfast., Disp: 90 capsule, Rfl: 1  Allergies  Allergen Reactions   Diclofenac Sodium Swelling   Naproxen Sodium Swelling   Etodolac Swelling   Gabapentin Swelling   Naproxen Swelling    I personally reviewed active problem list, medication list, allergies, family history, social history, health maintenance with the patient/caregiver today.   ROS  Ten systems reviewed and is negative except as mentioned in HPI    Objective  Vitals:   07/22/23 0813  BP: 128/68  Pulse: 95  Resp: 16  SpO2: 97%  Weight: 170 lb (77.1 kg)  Height: 5\' 1"  (1.549 m)    Body mass index is 32.12 kg/m.  Physical Exam  Constitutional: Patient appears well-developed and well-nourished. Obese No distress.  HEENT: head atraumatic, normocephalic, pupils equal and reactive to light, neck supple, cerumen impaction on right ear, normal left side Cardiovascular: Normal rate, regular rhythm and normal heart  sounds.  No murmur heard. No BLE edema. Pulmonary/Chest: Effort normal and breath sounds normal. No respiratory distress. Abdominal: Soft.  There is no tenderness. Muscular skeletal: using a cane, antalgic gait Psychiatric: Patient has a normal mood and affect. behavior is normal. Judgment and thought content normal.   Recent Results (from the past 2160 hour(s))  Microalbumin / creatinine urine ratio     Status: None   Collection Time: 06/19/23 12:00 AM  Result Value Ref Range   Microalb Creat Ratio 64.9   Protein / creatinine ratio, urine     Status: None   Collection Time: 06/19/23 12:00 AM  Result Value Ref Range   Creatinine, Urine 155.6    Albumin, U 101   Vitamin B12     Status: None   Collection Time: 06/19/23 12:00 AM  Result Value Ref Range   Vitamin B-12 >1,500   Hemoglobin A1c     Status: None   Collection Time: 06/19/23 12:00 AM  Result Value Ref Range   Hemoglobin A1C 7.3      PHQ2/9:    07/22/2023    8:12 AM 02/19/2023    9:33 AM 10/21/2022   10:20 AM 09/27/2022   12:17 PM 08/29/2022    8:40 AM  Depression screen PHQ 2/9  Decreased Interest 0 0 0 0 0  Down, Depressed, Hopeless 0 0 0 0 0  PHQ - 2 Score 0 0 0 0 0  Altered sleeping 0 0 0 0 0  Tired, decreased energy 0 0 0 0 0  Change in appetite 0 0 0 0 0  Feeling bad or failure about yourself  0 0 0 0 0  Trouble concentrating 0 0 0 0 0  Moving slowly or fidgety/restless 0 0 0 0 0  Suicidal thoughts 0 0 0 0 0  PHQ-9 Score 0 0 0 0 0    phq 9 is negative   Fall Risk:    07/22/2023    8:12 AM 02/19/2023    9:33 AM 10/21/2022   10:20 AM 09/27/2022   12:17 PM 08/29/2022    8:43 AM  Fall Risk   Falls in  the past year? 0 0 0 0 0  Number falls in past yr: 0 0  0   Injury with Fall? 0 0  0   Risk for fall due to : No Fall Risks No Fall Risks No Fall Risks No Fall Risks No Fall Risks  Follow up Falls prevention discussed Falls prevention discussed Falls prevention discussed;Falls evaluation  completed;Education provided Falls prevention discussed Falls prevention discussed;Education provided      Functional Status Survey: Is the patient deaf or have difficulty hearing?: Yes Does the patient have difficulty seeing, even when wearing glasses/contacts?: No Does the patient have difficulty concentrating, remembering, or making decisions?: No Does the patient have difficulty walking or climbing stairs?: Yes Does the patient have difficulty dressing or bathing?: No Does the patient have difficulty doing errands alone such as visiting a doctor's office or shopping?: No    Assessment & Plan  1. Dyslipidemia associated with type 2 diabetes mellitus (HCC)  - pravastatin (PRAVACHOL) 40 MG tablet; Take 1 tablet (40 mg total) by mouth daily.  Dispense: 90 tablet; Refill: 1  2. Major depression in remission (HCC)  - venlafaxine XR (EFFEXOR-XR) 150 MG 24 hr capsule; Take 1 capsule (150 mg total) by mouth daily with breakfast.  Dispense: 90 capsule; Refill: 1  3. Hypertension associated with diabetes (HCC)  - Olmesartan-amLODIPine-HCTZ 40-5-25 MG TABS; Take 1 tablet by mouth daily.  Dispense: 90 tablet; Refill: 1 - carvedilol (COREG) 3.125 MG tablet; Take 1 tablet (3.125 mg total) by mouth 2 (two) times daily.  Dispense: 180 tablet; Refill: 1  4. Chronic pain syndrome  Under the care of Pain clinic  5. Osteopenia after menopause  Continue vitamin D and high calcium die t  6. Obesity (BMI 30.0-34.9)  Losing weight, we will monitor, likely due to better controlled DM  7. Primary osteoarthritis of both hips  Keep follow up with ortho  8. Hypertension, benign  - Olmesartan-amLODIPine-HCTZ 40-5-25 MG TABS; Take 1 tablet by mouth daily.  Dispense: 90 tablet; Refill: 1 - carvedilol (COREG) 3.125 MG tablet; Take 1 tablet (3.125 mg total) by mouth 2 (two) times daily.  Dispense: 180 tablet; Refill: 1   9. Needs flu shot  - Flu Vaccine Trivalent High Dose (Fluad)   10.  Impacted cerumen of right ear  - Ear Lavage   Verbal consent given Possible side effects discussed with patient Ears were  lavaged with warm water and peroxide  Patient tolerated procedure well No complications

## 2023-07-22 ENCOUNTER — Encounter: Payer: Self-pay | Admitting: Family Medicine

## 2023-07-22 ENCOUNTER — Ambulatory Visit: Payer: Medicare Other | Admitting: Family Medicine

## 2023-07-22 VITALS — BP 128/68 | HR 95 | Resp 16 | Ht 61.0 in | Wt 170.0 lb

## 2023-07-22 DIAGNOSIS — H6121 Impacted cerumen, right ear: Secondary | ICD-10-CM | POA: Diagnosis not present

## 2023-07-22 DIAGNOSIS — M8580 Other specified disorders of bone density and structure, unspecified site: Secondary | ICD-10-CM

## 2023-07-22 DIAGNOSIS — E1159 Type 2 diabetes mellitus with other circulatory complications: Secondary | ICD-10-CM

## 2023-07-22 DIAGNOSIS — I152 Hypertension secondary to endocrine disorders: Secondary | ICD-10-CM

## 2023-07-22 DIAGNOSIS — Z794 Long term (current) use of insulin: Secondary | ICD-10-CM

## 2023-07-22 DIAGNOSIS — E1169 Type 2 diabetes mellitus with other specified complication: Secondary | ICD-10-CM | POA: Diagnosis not present

## 2023-07-22 DIAGNOSIS — M160 Bilateral primary osteoarthritis of hip: Secondary | ICD-10-CM | POA: Diagnosis not present

## 2023-07-22 DIAGNOSIS — G894 Chronic pain syndrome: Secondary | ICD-10-CM

## 2023-07-22 DIAGNOSIS — Z780 Asymptomatic menopausal state: Secondary | ICD-10-CM

## 2023-07-22 DIAGNOSIS — Z23 Encounter for immunization: Secondary | ICD-10-CM

## 2023-07-22 DIAGNOSIS — F325 Major depressive disorder, single episode, in full remission: Secondary | ICD-10-CM

## 2023-07-22 DIAGNOSIS — I10 Essential (primary) hypertension: Secondary | ICD-10-CM

## 2023-07-22 DIAGNOSIS — E66811 Obesity, class 1: Secondary | ICD-10-CM

## 2023-07-22 MED ORDER — VENLAFAXINE HCL ER 150 MG PO CP24
150.0000 mg | ORAL_CAPSULE | Freq: Every day | ORAL | 1 refills | Status: DC
Start: 2023-07-22 — End: 2023-11-24

## 2023-07-22 MED ORDER — PRAVASTATIN SODIUM 40 MG PO TABS
40.0000 mg | ORAL_TABLET | Freq: Every day | ORAL | 1 refills | Status: DC
Start: 2023-07-22 — End: 2023-11-24

## 2023-07-22 MED ORDER — OLMESARTAN-AMLODIPINE-HCTZ 40-5-25 MG PO TABS
1.0000 | ORAL_TABLET | Freq: Every day | ORAL | 1 refills | Status: DC
Start: 2023-07-22 — End: 2023-11-24

## 2023-07-22 MED ORDER — CARVEDILOL 3.125 MG PO TABS
3.1250 mg | ORAL_TABLET | Freq: Two times a day (BID) | ORAL | 1 refills | Status: DC
Start: 2023-07-22 — End: 2024-03-10

## 2023-08-04 DIAGNOSIS — G8929 Other chronic pain: Secondary | ICD-10-CM | POA: Diagnosis not present

## 2023-08-04 DIAGNOSIS — M179 Osteoarthritis of knee, unspecified: Secondary | ICD-10-CM | POA: Diagnosis not present

## 2023-08-04 DIAGNOSIS — M791 Myalgia, unspecified site: Secondary | ICD-10-CM | POA: Diagnosis not present

## 2023-08-04 DIAGNOSIS — M25551 Pain in right hip: Secondary | ICD-10-CM | POA: Diagnosis not present

## 2023-08-04 DIAGNOSIS — M5135 Other intervertebral disc degeneration, thoracolumbar region: Secondary | ICD-10-CM | POA: Diagnosis not present

## 2023-08-14 ENCOUNTER — Ambulatory Visit: Payer: Medicare Other

## 2023-08-14 DIAGNOSIS — Z0000 Encounter for general adult medical examination without abnormal findings: Secondary | ICD-10-CM | POA: Diagnosis not present

## 2023-08-14 DIAGNOSIS — F17200 Nicotine dependence, unspecified, uncomplicated: Secondary | ICD-10-CM

## 2023-08-14 DIAGNOSIS — F17210 Nicotine dependence, cigarettes, uncomplicated: Secondary | ICD-10-CM

## 2023-08-14 NOTE — Progress Notes (Signed)
Subjective:   Lorraine Jones is a 72 y.o. female who presents for Medicare Annual (Subsequent) preventive examination.  Visit Complete: Virtual I connected with  Lorraine Jones on 08/14/23 by a audio enabled telemedicine application and verified that I am speaking with the correct person using two identifiers.  Patient Location: Home  Provider Location: Office/Clinic  I discussed the limitations of evaluation and management by telemedicine. The patient expressed understanding and agreed to proceed.  Vital Signs: Because this visit was a virtual/telehealth visit, some criteria may be missing or patient reported. Any vitals not documented were not able to be obtained and vitals that have been documented are patient reported. Cardiac Risk Factors include: advanced age (>36men, >74 women);diabetes mellitus;hypertension;dyslipidemia;sedentary lifestyle;obesity (BMI >30kg/m2)     Objective:    Today's Vitals   08/14/23 0926  PainSc: 5    There is no height or weight on file to calculate BMI.     08/14/2023    9:32 AM 07/16/2023    1:05 PM 12/18/2022    9:27 AM 10/09/2022    1:01 PM 08/07/2022   10:40 AM 08/28/2021    8:17 AM 07/11/2020    8:30 AM  Advanced Directives  Does Patient Have a Medical Advance Directive? No No No No No No No  Does patient want to make changes to medical advance directive?   No - Patient declined No - Patient declined No - Patient declined    Would patient like information on creating a medical advance directive? No - Patient declined No - Patient declined No - Patient declined No - Patient declined No - Patient declined Yes (MAU/Ambulatory/Procedural Areas - Information given) Yes (MAU/Ambulatory/Procedural Areas - Information given)    Current Medications (verified) Outpatient Encounter Medications as of 08/14/2023  Medication Sig   aspirin EC 81 MG tablet Take 81 mg by mouth daily.   Blood Glucose Monitoring Suppl (ONETOUCH VERIO) w/Device KIT     carvedilol (COREG) 3.125 MG tablet Take 1 tablet (3.125 mg total) by mouth 2 (two) times daily.   dorzolamide-timolol (COSOPT) 22.3-6.8 MG/ML ophthalmic solution Place 1 drop into both eyes 2 (two) times daily.   fentaNYL (DURAGESIC - DOSED MCG/HR) 75 MCG/HR Apply 2 patches to skin every 2 days if tolerated.   NOTE: Do not apply 100 g per hour fentanyl patch since insurance will not approve 100 g patch for you   glucose blood test strip 1 strip by Other route 2 (two) times daily.   metFORMIN (GLUCOPHAGE) 1000 MG tablet Take 1 tablet (1,000 mg total) by mouth 2 (two) times daily with a meal.   metoCLOPramide (REGLAN) 5 MG tablet TAKE 1 TABLET (5 MG TOTAL) BY MOUTH EVERY 6 (SIX) HOURS AS NEEDED. FOR NAUSEA   Multiple Vitamins-Minerals (MULTIVITAMIN WITH MINERALS) tablet Take 1 tablet by mouth daily.   NOVOLIN N RELION 100 UNIT/ML injection INJECT 28 UNITS SUBCUTANEOUSLY TWO TIMES DAILY.   NOVOLIN R RELION 100 UNIT/ML injection INJECT 20 UNITS SUBCUTANEOUSLY THREE TIMES DAILY BEFORE MEAL(S) (ADD SLIDING SCALE AS NECESSARY)   Olmesartan-amLODIPine-HCTZ 40-5-25 MG TABS Take 1 tablet by mouth daily.   Oxycodone HCl 20 MG TABS LIMIT 1/2 TO 1 TABLET BY MOUTH 3 TO 5 TIMES PER DAY IF TOLERATED *NOTE TABLET IS TWICE AS STRONG*   polyethylene glycol (MIRALAX / GLYCOLAX) packet Take 17 g by mouth daily.   pravastatin (PRAVACHOL) 40 MG tablet Take 1 tablet (40 mg total) by mouth daily.   venlafaxine XR (EFFEXOR-XR) 150 MG 24  hr capsule Take 1 capsule (150 mg total) by mouth daily with breakfast.   cetirizine (ZYRTEC) 10 MG tablet Take 1 tablet (10 mg total) by mouth at bedtime. (Patient not taking: Reported on 08/14/2023)   No facility-administered encounter medications on file as of 08/14/2023.    Allergies (verified) Diclofenac sodium, Naproxen sodium, Etodolac, Gabapentin, and Naproxen   History: Past Medical History:  Diagnosis Date   Arthritis    Chronic low back pain    Depression    Diabetes  mellitus    Hyperlipidemia    Hypertension    Past Surgical History:  Procedure Laterality Date   CHOLECYSTECTOMY  09/30/1994   CYSTOSCOPY WITH URETEROSCOPY Right 09/01/2013   Procedure: CYSTOSCOPY WITH UNROOFING  RIGHT URETEROCELE AND URETERAL STONE REMOVED  WITH GYRUS COLLINS KNIFE. ;  Surgeon: Bjorn Pippin, MD;  Location: [facility] [place];  Service: Urology;  Laterality: Right;  cysto, right uretersoscopy and stone extraction   collins knife   LUMBAR DISC SURGERY  10/01/1991   L4  --  L5   VULVECTOMY PARTIAL  09/11/2022   Family History  Problem Relation Age of Onset   Diabetes Mother    Heart disease Mother    Hypertension Mother    Breast cancer Neg Hx    Social History   Socioeconomic History   Marital status: Married    Spouse name: Not on file   Number of children: 3   Years of education: Not on file   Highest education level: 12th grade  Occupational History   Occupation: retired  Tobacco Use   Smoking status: Every Day    Current packs/day: 0.50    Average packs/day: 0.5 packs/day for 20.0 years (10.0 ttl pk-yrs)    Types: Cigarettes   Smokeless tobacco: Never   Tobacco comments:    she is cutting down   Vaping Use   Vaping status: Never Used  Substance and Sexual Activity   Alcohol use: No    Alcohol/week: 0.0 standard drinks of alcohol   Drug use: No   Sexual activity: Not Currently    Partners: Male    Birth control/protection: Post-menopausal  Other Topics Concern   Not on file  Social History Narrative   Not on file   Social Determinants of Health   Financial Resource Strain: Low Risk  (08/14/2023)   Overall Financial Resource Strain (CARDIA)    Difficulty of Paying Living Expenses: Not hard at all  Food Insecurity: No Food Insecurity (08/14/2023)   Hunger Vital Sign    Worried About Running Out of Food in the Last Year: Never true    Ran Out of Food in the Last Year: Never true  Transportation Needs: No Transportation Needs  (08/14/2023)   PRAPARE - Administrator, Civil Service (Medical): No    Lack of Transportation (Non-Medical): No  Physical Activity: Insufficiently Active (08/14/2023)   Exercise Vital Sign    Days of Exercise per Week: 3 days    Minutes of Exercise per Session: 20 min  Stress: No Stress Concern Present (08/14/2023)   [employer] of Occupational Health - Occupational Stress Questionnaire    Feeling of Stress : Not at all  Social Connections: Moderately Isolated (08/14/2023)   Social Connection and Isolation Panel [NHANES]    Frequency of Communication with Friends and Family: Twice a week    Frequency of Social Gatherings with Friends and Family: Never    Attends Religious Services: More than 4 times per year  Active Member of Clubs or Organizations: No    Attends Banker Meetings: Never    Marital Status: Married    Tobacco Counseling Ready to quit: Not Answered Counseling given: Not Answered Tobacco comments: she is cutting down    Clinical Intake:  Pre-visit preparation completed: Yes  Pain : 0-10 Pain Score: 5  Pain Type: Chronic pain Pain Location: Back Pain Orientation: Lower Pain Descriptors / Indicators: Aching, Burning Pain Onset: More than a month ago Pain Frequency: Intermittent Pain Relieving Factors: oxycodone, goes to pain clinic  Pain Relieving Factors: oxycodone, goes to pain clinic  BMI - recorded: 32.1 Nutritional Status: BMI > 30  Obese Nutritional Risks: None Diabetes: Yes CBG done?: No Did pt. bring in CBG monitor from home?: No  How often do you need to have someone help you when you read instructions, pamphlets, or other written materials from your doctor or pharmacy?: 1 - Never  Interpreter Needed?: No  Information entered by :: Kennedy Bucker, LPN   Activities of Daily Living    08/14/2023    9:33 AM 07/22/2023    8:12 AM  In your present state of health, do you have any difficulty performing the  following activities:  Hearing? 0 1  Vision? 0 0  Difficulty concentrating or making decisions? 0 0  Walking or climbing stairs? 0 1  Dressing or bathing? 0 0  Doing errands, shopping? 0 0  Preparing Food and eating ? N   Using the Toilet? N   In the past six months, have you accidently leaked urine? N   Do you have problems with loss of bowel control? N   Managing your Medications? N   Managing your Finances? N   Housekeeping or managing your Housekeeping? N     Patient Care Team: Alba Cory, MD as PCP - General (Family Medicine) Sherlon Handing, MD as Consulting Physician (Internal Medicine) Ewing Schlein, MD as Referring Physician (Pain Medicine) Lonell Face, MD as Consulting Physician (Neurology) Benita Gutter, RN as Oncology Nurse Navigator [place], [place], OD (Optometry)  Indicate any recent Medical Services you may have received from other than [facility] providers in the past year (date may be approximate).     Assessment:   This is a routine wellness examination for [facility].  Hearing/Vision screen Hearing Screening - Comments:: No aids Vision Screening - Comments:: Wears glasses- [facility]   Goals Addressed             This Visit's Progress    DIET - EAT MORE FRUITS AND VEGETABLES         Depression Screen    08/14/2023    9:30 AM 07/22/2023    8:12 AM 02/19/2023    9:33 AM 10/21/2022   10:20 AM 09/27/2022   12:17 PM 08/29/2022    8:40 AM 07/17/2022    8:29 AM  PHQ 2/9 Scores  PHQ - 2 Score 0 0 0 0 0 0 0  PHQ- 9 Score 0 0 0 0 0 0 0    Fall Risk    08/14/2023    9:33 AM 07/22/2023    8:12 AM 02/19/2023    9:33 AM 10/21/2022   10:20 AM 09/27/2022   12:17 PM  Fall Risk   Falls in the past year? 0 0 0 0 0  Number falls in past yr: 0 0 0  0  Injury with Fall? 0 0 0  0  Risk for fall due to : No  Fall Risks No Fall Risks No Fall Risks No Fall Risks No Fall Risks  Follow up Falls prevention discussed;Falls evaluation completed  Falls prevention discussed Falls prevention discussed Falls prevention discussed;Falls evaluation completed;Education provided Falls prevention discussed    MEDICARE RISK AT HOME: Medicare Risk at Home Any stairs in or around the home?: Yes If so, are there any without handrails?: No Home free of loose throw rugs in walkways, pet beds, electrical cords, etc?: Yes Adequate lighting in your home to reduce risk of falls?: Yes Life alert?: No Use of a cane, walker or w/c?: Yes (cane most of time) Grab bars in the bathroom?: Yes Shower chair or bench in shower?: Yes Elevated toilet seat or a handicapped toilet?: No  TIMED UP AND GO:  Was the test performed?  No    Cognitive Function:        08/14/2023    9:34 AM 08/29/2022    8:41 AM 07/11/2020    8:32 AM 12/01/2018   10:47 AM  6CIT Screen  What Year? 0 points 0 points 0 points 0 points  What month? 0 points 0 points 0 points 0 points  What time? 0 points 0 points 0 points 0 points  Count back from 20 0 points 0 points 0 points 0 points  Months in reverse 0 points 0 points 0 points 0 points  Repeat phrase 0 points 0 points 6 points 0 points  Total Score 0 points 0 points 6 points 0 points    Immunizations Immunization History  Administered Date(s) Administered   Fluad Quad(high Dose 65+) 06/28/2019, 07/11/2020, 07/29/2022   Fluad Trivalent(High Dose 65+) 07/22/2023   Influenza, High Dose Seasonal PF 06/25/2016, 06/30/2017, 12/01/2018, 07/17/2021   Influenza,inj,Quad PF,6+ Mos 06/19/2015   Influenza-Unspecified 06/08/2014   PFIZER(Purple Top)SARS-COV-2 Vaccination 11/24/2019, 12/15/2019, 08/17/2020   Pneumococcal Conjugate-13 03/04/2016   Pneumococcal Polysaccharide-23 06/02/2014, 06/28/2019   Tdap 06/28/2008    TDAP status: Due, Education has been provided regarding the importance of this vaccine. Advised may receive this vaccine at local pharmacy or Health Dept. Aware to provide a copy of the vaccination record if  obtained from local pharmacy or Health Dept. Verbalized acceptance and understanding.  Flu Vaccine status: Up to date  Pneumococcal vaccine status: Up to date  Covid-19 vaccine status: Completed vaccines  Qualifies for Shingles Vaccine? Yes   Zostavax completed No   Shingrix Completed?: No.    Education has been provided regarding the importance of this vaccine. Patient has been advised to call insurance company to determine out of pocket expense if they have not yet received this vaccine. Advised may also receive vaccine at local pharmacy or Health Dept. Verbalized acceptance and understanding.  Screening Tests Health Maintenance  Topic Date Due   Zoster Vaccines- Shingrix (1 of 2) Never done   COVID-19 Vaccine (4 - 2023-24 season) 06/01/2023   OPHTHALMOLOGY EXAM  08/17/2023   FOOT EXAM  10/22/2023   Diabetic kidney evaluation - eGFR measurement  12/17/2023   HEMOGLOBIN A1C  12/17/2023   Fecal DNA (Cologuard)  04/11/2024   Diabetic kidney evaluation - Urine ACR  06/18/2024   Medicare Annual Wellness (AWV)  08/13/2024   MAMMOGRAM  08/21/2024   Pneumonia Vaccine 8+ Years old  Completed   INFLUENZA VACCINE  Completed   DEXA SCAN  Completed   Hepatitis C Screening  Completed   HPV VACCINES  Aged Out   DTaP/Tdap/Td  Discontinued   Colonoscopy  Discontinued    Health Maintenance  Health Maintenance  Due  Topic Date Due   Zoster Vaccines- Shingrix (1 of 2) Never done   COVID-19 Vaccine (4 - 2023-24 season) 06/01/2023    Colorectal cancer screening: Type of screening: Cologuard. Completed 04/11/21. Repeat every 3 years  Mammogram status: Completed SCHEDULED FOR 08/25/23. Repeat every year  Bone Density status: Completed 08/21/22. Results reflect: Bone density results: OSTEOPENIA. Repeat every 5 years.  Lung Cancer Screening: (Low Dose CT Chest recommended if Age 37-80 years, 20 pack-year currently smoking OR have quit w/in 15years.) does qualify.   Lung Cancer Screening  Referral: [id number] SENT  Additional Screening:  Hepatitis C Screening: does qualify; Completed 04/15/13  Vision Screening: Recommended annual ophthalmology exams for early detection of glaucoma and other disorders of the eye. Is the patient up to date with their annual eye exam?  Yes  Who is the provider or what is the name of the office in which the patient attends annual eye exams? Dr.Bulakowski If pt is not established with a provider, would they like to be referred to a provider to establish care? No .   Dental Screening: Recommended annual dental exams for proper oral hygiene  Diabetic Foot Exam: Diabetic Foot Exam: Completed 10/21/22  Community Resource Referral / Chronic Care Management: CRR required this visit?  No   CCM required this visit?  No     Plan:     I have personally reviewed and noted the following in the patient's chart:   Medical and social history Use of alcohol, tobacco or illicit drugs  Current medications and supplements including opioid prescriptions. Patient is currently taking opioid prescriptions. Information provided to patient regarding non-opioid alternatives. Patient advised to discuss non-opioid treatment plan with their provider. Functional ability and status Nutritional status Physical activity Advanced directives List of other physicians Hospitalizations, surgeries, and ER visits in previous 12 months Vitals Screenings to include cognitive, depression, and falls Referrals and appointments  In addition, I have reviewed and discussed with patient certain preventive protocols, quality metrics, and best practice recommendations. A written personalized care plan for preventive services as well as general preventive health recommendations were provided to patient.     Hal Hope, LPN   [id number]/[id number]   After Visit Summary: (MyChart) Due to this being a telephonic visit, the after visit summary with patients personalized plan was offered to  patient via MyChart   Nurse Notes:  [phone] sent

## 2023-08-14 NOTE — Patient Instructions (Addendum)
Ms. Weisman , Thank you for taking time to come for your Medicare Wellness Visit. I appreciate your ongoing commitment to your health goals. Please review the following plan we discussed and let me know if I can assist you in the future.   Referrals/Orders/Follow-Ups/Clinician Recommendations: referral for smoker sent  This is a list of the screening recommended for you and due dates:  Health Maintenance  Topic Date Due   Zoster (Shingles) Vaccine (1 of 2) Never done   COVID-19 Vaccine (4 - 2023-24 season) 06/01/2023   Eye exam for diabetics  08/17/2023   Complete foot exam   10/22/2023   Yearly kidney function blood test for diabetes  12/17/2023   Hemoglobin A1C  12/17/2023   Cologuard (Stool DNA test)  04/11/2024   Yearly kidney health urinalysis for diabetes  06/18/2024   Medicare Annual Wellness Visit  08/13/2024   Mammogram  08/21/2024   Pneumonia Vaccine  Completed   Flu Shot  Completed   DEXA scan (bone density measurement)  Completed   Hepatitis C Screening  Completed   HPV Vaccine  Aged Out   DTaP/Tdap/Td vaccine  Discontinued   Colon Cancer Screening  Discontinued    Advanced directives: (ACP Link)Information on Advanced Care Planning can be found at [facility] Advance Health Care Directives Advance Health Care Directives ([email or web address]   Next Medicare Annual Wellness Visit scheduled for next year: Yes   08/19/24 @ 10:10 am in person

## 2023-08-22 DIAGNOSIS — Z9842 Cataract extraction status, left eye: Secondary | ICD-10-CM | POA: Diagnosis not present

## 2023-08-22 DIAGNOSIS — H52213 Irregular astigmatism, bilateral: Secondary | ICD-10-CM | POA: Diagnosis not present

## 2023-08-22 DIAGNOSIS — E113293 Type 2 diabetes mellitus with mild nonproliferative diabetic retinopathy without macular edema, bilateral: Secondary | ICD-10-CM | POA: Diagnosis not present

## 2023-08-22 DIAGNOSIS — H40023 Open angle with borderline findings, high risk, bilateral: Secondary | ICD-10-CM | POA: Diagnosis not present

## 2023-08-22 DIAGNOSIS — H40053 Ocular hypertension, bilateral: Secondary | ICD-10-CM | POA: Diagnosis not present

## 2023-08-22 DIAGNOSIS — Z9841 Cataract extraction status, right eye: Secondary | ICD-10-CM | POA: Diagnosis not present

## 2023-08-22 LAB — HM DIABETES EYE EXAM

## 2023-08-25 ENCOUNTER — Ambulatory Visit
Admission: RE | Admit: 2023-08-25 | Discharge: 2023-08-25 | Disposition: A | Discharge status: Home / Self Care | Payer: Medicare Other | Source: Ambulatory Visit | Attending: Family Medicine | Admitting: Family Medicine

## 2023-08-25 DIAGNOSIS — Z1231 Encounter for screening mammogram for malignant neoplasm of breast: Secondary | ICD-10-CM | POA: Insufficient documentation

## 2023-09-01 DIAGNOSIS — M179 Osteoarthritis of knee, unspecified: Secondary | ICD-10-CM | POA: Diagnosis not present

## 2023-09-01 DIAGNOSIS — G894 Chronic pain syndrome: Secondary | ICD-10-CM | POA: Diagnosis not present

## 2023-09-01 DIAGNOSIS — Z79891 Long term (current) use of opiate analgesic: Secondary | ICD-10-CM | POA: Diagnosis not present

## 2023-09-02 ENCOUNTER — Ambulatory Visit: Payer: Medicare Other

## 2023-10-06 DIAGNOSIS — M179 Osteoarthritis of knee, unspecified: Secondary | ICD-10-CM | POA: Diagnosis not present

## 2023-10-06 DIAGNOSIS — G894 Chronic pain syndrome: Secondary | ICD-10-CM | POA: Diagnosis not present

## 2023-10-06 DIAGNOSIS — Z79891 Long term (current) use of opiate analgesic: Secondary | ICD-10-CM | POA: Diagnosis not present

## 2023-11-03 DIAGNOSIS — G894 Chronic pain syndrome: Secondary | ICD-10-CM | POA: Diagnosis not present

## 2023-11-03 DIAGNOSIS — Z79891 Long term (current) use of opiate analgesic: Secondary | ICD-10-CM | POA: Diagnosis not present

## 2023-11-03 DIAGNOSIS — M179 Osteoarthritis of knee, unspecified: Secondary | ICD-10-CM | POA: Diagnosis not present

## 2023-11-24 ENCOUNTER — Encounter: Payer: Self-pay | Admitting: Family Medicine

## 2023-11-24 ENCOUNTER — Ambulatory Visit (INDEPENDENT_AMBULATORY_CARE_PROVIDER_SITE_OTHER): Payer: Medicare Other | Admitting: Family Medicine

## 2023-11-24 VITALS — BP 134/72 | HR 95 | Temp 97.9°F | Resp 16 | Ht 61.0 in | Wt 162.7 lb

## 2023-11-24 DIAGNOSIS — E1169 Type 2 diabetes mellitus with other specified complication: Secondary | ICD-10-CM | POA: Diagnosis not present

## 2023-11-24 DIAGNOSIS — I152 Hypertension secondary to endocrine disorders: Secondary | ICD-10-CM

## 2023-11-24 DIAGNOSIS — F325 Major depressive disorder, single episode, in full remission: Secondary | ICD-10-CM | POA: Insufficient documentation

## 2023-11-24 DIAGNOSIS — M160 Bilateral primary osteoarthritis of hip: Secondary | ICD-10-CM

## 2023-11-24 DIAGNOSIS — G894 Chronic pain syndrome: Secondary | ICD-10-CM

## 2023-11-24 DIAGNOSIS — Z8544 Personal history of malignant neoplasm of other female genital organs: Secondary | ICD-10-CM | POA: Insufficient documentation

## 2023-11-24 DIAGNOSIS — E1159 Type 2 diabetes mellitus with other circulatory complications: Secondary | ICD-10-CM

## 2023-11-24 DIAGNOSIS — E785 Hyperlipidemia, unspecified: Secondary | ICD-10-CM | POA: Diagnosis not present

## 2023-11-24 DIAGNOSIS — Z7984 Long term (current) use of oral hypoglycemic drugs: Secondary | ICD-10-CM

## 2023-11-24 DIAGNOSIS — Z780 Asymptomatic menopausal state: Secondary | ICD-10-CM

## 2023-11-24 DIAGNOSIS — M8580 Other specified disorders of bone density and structure, unspecified site: Secondary | ICD-10-CM | POA: Diagnosis not present

## 2023-11-24 DIAGNOSIS — E66811 Obesity, class 1: Secondary | ICD-10-CM | POA: Diagnosis not present

## 2023-11-24 MED ORDER — OLMESARTAN-AMLODIPINE-HCTZ 40-5-25 MG PO TABS: 1.0000 | ORAL_TABLET | Freq: Every day | ORAL | 1 refills | Status: DC

## 2023-11-24 MED ORDER — PRAVASTATIN SODIUM 40 MG PO TABS: 40.0000 mg | ORAL_TABLET | Freq: Every day | ORAL | 1 refills | Status: DC

## 2023-11-24 MED ORDER — VENLAFAXINE HCL ER 150 MG PO CP24: 150.0000 mg | ORAL_CAPSULE | Freq: Every day | ORAL | 1 refills | Status: DC

## 2023-11-24 NOTE — Progress Notes (Signed)
 Name: Lorraine Jones   MRN: [id number]    DOB: 1951-08-19   Date:11/24/2023       Progress Note  Subjective  Chief Complaint  Chief Complaint  Patient presents with   Medical Management of Chronic Issues   HPI   DMII with neuropathy,dyslipidemia and gastroparesis, history of  microalbuminuria  and mild diabetic retinopathy (diagnosed July 2020). She is now under the care of Dr. Gershon Crane, last A1C was done 06/2023 and it was   was down to 7.3 % , last urine micro was 64.9 , she also will start using a CGM  but she did not bring it in for a review today. Highest level recently has been 240 , lowest 107. She is now on Metformin and insulin N  34 units BID and pre meal is 28 units qac regular She denies polydipsia or polyphagia, or polyuria , she states neuropathy symptoms under control with gabapentin occasionally feels very full after a meals but controlled with metoclopramide She is compliant with statins and aspirin daily also on ARB.    HTN in DM:  she is compliant with medication .No chest pain, dizziness  or palpitation. She is on lower dose of Tribenzor 40/5/25 and Carvedilol, BP is okay today at 134/72 , at home also around 130's    Chronic Pain: she goes to pain clinic once a month,  taking pain medication as prescribed, she has constipation secondary to narcotics and is taking Miralax otc to control symptoms, she has bowel movements ever other day, but not straining or blood in stools [place] 3 and 4.  She has chronic back pain, and pain is on average 6/10  it can go up to 10/10 when the patch is about to have to be replaced    OA both hips: she was advised by ortho to have hip replacement but she is not ready yet . She continues to use a cane. She states losing weight helped with her hip pain. Aggravated by cold weather   Hyperlipidemia: She stopped Crestor and went back on Pravastatin because of side effects.  She denies myalgias secondary to medication.  Last LDL was at goal down to 52 ,  continue current regiment She will have labs done by Dr. Gershon Crane next month    Osteopenia: last bone density showed FRAX of 3.3 % for major osteoporotic fracture and 0.5 % for hip fracture. Osteopenia on wrist only, advised to continue high calcium diet and vitamin D supplementation. Stable    Obesity: BMI is now below 35 . She continues to lose weight , she states just monitoring her diet. She has lost about 15 lbs int he past year but being mindful. Her goal is to get down to 160 lbs. The main change was not snacking all the time - she used to have candy and potato chips    Depression in remission: she has a long of major depression, part of it secondary to living in pain and inability to do things she likes. She  takes Effexor and seems to be in remission. She helps take care of her grandson that is now a toddler  and that has been great  for her mood    Eczema: she is applying lotion and prn steroids and it helps  Hands is always dry but stable.    History of Vulvar Cancer: right vulva partial vulvectomy done in Dec 2023 by Dr. Phillis Knack , last visit with him was October 2024 and will go back  in 6 months . She is doing well    Patient Active Problem List   Diagnosis Date Noted   Vulvar cancer (HCC) 08/07/2022   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) 04/06/2019   Primary osteoarthritis of both hips 02/06/2018   Hypertension associated with diabetes (HCC) 07/23/2017   Hyperlipidemia due to type 2 diabetes mellitus (HCC) 07/23/2017   Major depression, recurrent (HCC) 06/25/2016   Osteopenia 04/18/2016   Gastroesophageal reflux disease without esophagitis 05/12/2015   Chronic pain 05/12/2015   Hyperlipidemia 05/12/2015   Lumbar post-laminectomy syndrome 02/27/2015   Neuropathy due to secondary diabetes (HCC) 02/27/2015   Spinal stenosis, lumbar region, with neurogenic claudication 02/27/2015    Sacroiliac joint dysfunction of both sides 02/27/2015   Degenerative joint disease (DJD) of hip 02/27/2015   DDD (degenerative disc disease), cervical 02/27/2015   Bilateral occipital neuralgia 02/27/2015   Chronic constipation 06/02/2014    Past Surgical History:  Procedure Laterality Date   CHOLECYSTECTOMY  09/30/1994   CYSTOSCOPY WITH URETEROSCOPY Right 09/01/2013   Procedure: CYSTOSCOPY WITH UNROOFING  RIGHT URETEROCELE AND URETERAL STONE REMOVED  WITH GYRUS COLLINS KNIFE. ;  Surgeon: Bjorn Pippin, MD;  Location: [facility];  Service: Urology;  Laterality: Right;  cysto, right uretersoscopy and stone extraction   collins knife   LUMBAR DISC SURGERY  10/01/1991   L4  --  L5   VULVECTOMY PARTIAL  09/11/2022    Family History  Problem Relation Age of Onset   Diabetes Mother    Heart disease Mother    Hypertension Mother    Breast cancer Neg Hx     Social History   Tobacco Use   Smoking status: Every Day    Current packs/day: 0.50    Average packs/day: 0.5 packs/day for 20.0 years (10.0 ttl pk-yrs)    Types: Cigarettes   Smokeless tobacco: Never   Tobacco comments:    she is cutting down   Substance Use Topics   Alcohol use: No    Alcohol/week: 0.0 standard drinks of alcohol     Current Outpatient Medications:    aspirin EC 81 MG tablet, Take 81 mg by mouth daily., Disp: , Rfl:    Blood Glucose Monitoring Suppl (ONETOUCH VERIO) w/Device KIT, , Disp: , Rfl:    carvedilol (COREG) 3.125 MG tablet, Take 1 tablet (3.125 mg total) by mouth 2 (two) times daily., Disp: 180 tablet, Rfl: 1   dorzolamide-timolol (COSOPT) 22.3-6.8 MG/ML ophthalmic solution, Place 1 drop into both eyes 2 (two) times daily., Disp: , Rfl:    fentaNYL (DURAGESIC - DOSED MCG/HR) 75 MCG/HR, Apply 2 patches to skin every 2 days if tolerated.   NOTE: Do not apply 100 g per hour fentanyl patch since insurance will not approve 100 g patch for you, Disp: 30 patch, Rfl: 0   glucose blood test  strip, 1 strip by Other route 2 (two) times daily., Disp: , Rfl:    metFORMIN (GLUCOPHAGE) 1000 MG tablet, Take 1 tablet (1,000 mg total) by mouth 2 (two) times daily with a meal., Disp: 180 tablet, Rfl: 1   metoCLOPramide (REGLAN) 5 MG tablet, TAKE 1 TABLET (5 MG TOTAL) BY MOUTH EVERY 6 (SIX) HOURS AS NEEDED. FOR NAUSEA, Disp: 90 tablet, Rfl: 1   Multiple Vitamins-Minerals (MULTIVITAMIN WITH MINERALS) tablet, Take 1 tablet by mouth daily., Disp: , Rfl:    NOVOLIN N RELION 100 UNIT/ML injection, INJECT 28 UNITS SUBCUTANEOUSLY TWO TIMES  DAILY., Disp: , Rfl:    NOVOLIN R RELION 100 UNIT/ML injection, INJECT 20 UNITS SUBCUTANEOUSLY THREE TIMES DAILY BEFORE MEAL(S) (ADD SLIDING SCALE AS NECESSARY), Disp: , Rfl:    Olmesartan-amLODIPine-HCTZ 40-5-25 MG TABS, Take 1 tablet by mouth daily., Disp: 90 tablet, Rfl: 1   Oxycodone HCl 20 MG TABS, LIMIT 1/2 TO 1 TABLET BY MOUTH 3 TO 5 TIMES PER DAY IF TOLERATED *NOTE TABLET IS TWICE AS STRONG*, Disp: , Rfl:    polyethylene glycol (MIRALAX / GLYCOLAX) packet, Take 17 g by mouth daily., Disp: 14 each, Rfl: 0   pravastatin (PRAVACHOL) 40 MG tablet, Take 1 tablet (40 mg total) by mouth daily., Disp: 90 tablet, Rfl: 1   venlafaxine XR (EFFEXOR-XR) 150 MG 24 hr capsule, Take 1 capsule (150 mg total) by mouth daily with breakfast., Disp: 90 capsule, Rfl: 1   cetirizine (ZYRTEC) 10 MG tablet, Take 1 tablet (10 mg total) by mouth at bedtime. (Patient not taking: Reported on 11/24/2023), Disp: 30 tablet, Rfl: 11  Allergies  Allergen Reactions   Diclofenac Sodium Swelling   Naproxen Sodium Swelling   Etodolac Swelling   Gabapentin Swelling   Naproxen Swelling    I personally reviewed active problem list, medication list, allergies, family history with the patient/caregiver today.   ROS  Constitutional: Negative for fever or weight change.  Respiratory: Negative for cough and shortness of breath.   Cardiovascular: Negative for chest pain or palpitations.   Gastrointestinal: Negative for abdominal pain, no bowel changes.  Musculoskeletal: Negative for gait problem or joint swelling.  Skin: Negative for rash.  Neurological: Negative for dizziness or headache.  No other specific complaints in a complete review of systems (except as listed in HPI above).   Objective  Vitals:   11/24/23 0838  BP: 134/72  Pulse: 95  Resp: 16  Temp: 97.9 F (36.6 C)  TempSrc: Oral  SpO2: 98%  Weight: 162 lb 11.2 oz (73.8 kg)  Height: 5\' 1"  (1.549 m)    Body mass index is 30.74 kg/m.  Physical Exam  Constitutional: Patient appears well-developed and well-nourished. Obese  No distress.  HEENT: head atraumatic, normocephalic, pupils equal and reactive to light, neck supple Cardiovascular: Normal rate, regular rhythm and normal heart sounds.  No murmur heard. No BLE edema. Pulmonary/Chest: Effort normal and breath sounds normal. No respiratory distress. Abdominal: Soft.  There is no tenderness. Psychiatric: Patient has a normal mood and affect. behavior is normal. Judgment and thought content normal.   Diabetic Foot Exam:     PHQ2/9:    11/24/2023    8:38 AM 08/14/2023    9:30 AM 07/22/2023    8:12 AM 02/19/2023    9:33 AM 10/21/2022   10:20 AM  Depression screen PHQ 2/9  Decreased Interest 0 0 0 0 0  Down, Depressed, Hopeless 0 0 0 0 0  PHQ - 2 Score 0 0 0 0 0  Altered sleeping 0 0 0 0 0  Tired, decreased energy 0 0 0 0 0  Change in appetite 0 0 0 0 0  Feeling bad or failure about yourself  0 0 0 0 0  Trouble concentrating 0 0 0 0 0  Moving slowly or fidgety/restless 0 0 0 0 0  Suicidal thoughts 0 0 0 0 0  PHQ-9 Score 0 0 0 0 0  Difficult doing work/chores Not difficult at all        phq 9 is negative  Fall Risk:    11/24/2023  8:32 AM 08/14/2023    9:33 AM 07/22/2023    8:12 AM 02/19/2023    9:33 AM 10/21/2022   10:20 AM  Fall Risk   Falls in the past year? 0 0 0 0 0  Number falls in past yr: 0 0 0 0   Injury with Fall? 0 0  0 0   Risk for fall due to : No Fall Risks No Fall Risks No Fall Risks No Fall Risks No Fall Risks  Follow up Falls prevention discussed;Education provided;Falls evaluation completed Falls prevention discussed;Falls evaluation completed Falls prevention discussed Falls prevention discussed Falls prevention discussed;Falls evaluation completed;Education provided     Assessment & Plan  1. Dyslipidemia associated with type 2 diabetes mellitus (HCC) (Primary)  - pravastatin (PRAVACHOL) 40 MG tablet; Take 1 tablet (40 mg total) by mouth daily.  Dispense: 90 tablet; Refill: 1  2. Hypertension associated with diabetes (HCC)  - Olmesartan-amLODIPine-HCTZ 40-5-25 MG TABS; Take 1 tablet by mouth daily.  Dispense: 90 tablet; Refill: 1  3. Major depression in remission (HCC)  - venlafaxine XR (EFFEXOR-XR) 150 MG 24 hr capsule; Take 1 capsule (150 mg total) by mouth daily with breakfast.  Dispense: 90 capsule; Refill: 1  4. Obesity (BMI 30.0-34.9)  Losing weight, goal is 160 lbs  5. Chronic pain syndrome  Not as controlled with patch every 72 hours, previous pain doctor was changing every 48 hours, advised to discuss it with pain clinic   6. Osteopenia after menopause  On calcium plus vitamin D  8. Primary osteoarthritis of both hips  Doing slightly better with weight loss  9. History of cancer of vulva  Keep follow up with gyn

## 2023-12-18 LAB — HEMOGLOBIN A1C: Hemoglobin A1C: 8.2

## 2023-12-18 LAB — LIPID PANEL
HDL: 54 (ref 35–70)
LDL Cholesterol: 67
Triglycerides: 204 — AB (ref 40–160)

## 2023-12-18 LAB — HM DIABETES FOOT EXAM

## 2023-12-18 LAB — COMPREHENSIVE METABOLIC PANEL WITH GFR: eGFR: 53

## 2023-12-30 ENCOUNTER — Other Ambulatory Visit: Payer: Self-pay | Admitting: Family Medicine

## 2024-01-14 ENCOUNTER — Inpatient Hospital Stay: Payer: Medicare Other

## 2024-01-21 ENCOUNTER — Other Ambulatory Visit: Payer: Self-pay | Admitting: Family Medicine

## 2024-01-23 ENCOUNTER — Other Ambulatory Visit: Payer: Self-pay | Admitting: Family Medicine

## 2024-01-26 ENCOUNTER — Telehealth: Payer: Self-pay

## 2024-01-26 NOTE — Telephone Encounter (Signed)
 Copied from CRM [id number]. Topic: Clinical - Prescription Issue >> Jan 26, 2024 11:37 AM Crispin Dolphin wrote: Reason for CRM: Patient called and wanted why prescription for metoCLOPramide  (REGLAN ) 5 MG tablet was denied. Let her know it was requested too soon. Patient states pharmacy told her she is out of refills. Patient states she only has enough left for the rest of the month. Would like to know if it can be sent to pharmacy once its able to be ordered. Thank You

## 2024-01-26 NOTE — Telephone Encounter (Signed)
 Advised patient since its just PRN medication she will need to call us  back once she is down to a week of her medicine to refill. It is too early to fill at this moment. Patient verbalized understanding

## 2024-02-04 ENCOUNTER — Inpatient Hospital Stay: Attending: Obstetrics and Gynecology | Admitting: Obstetrics and Gynecology

## 2024-02-04 ENCOUNTER — Encounter: Payer: Self-pay | Admitting: Obstetrics and Gynecology

## 2024-02-04 VITALS — BP 141/66 | HR 74 | Temp 98.6°F | Resp 20 | Wt 158.4 lb

## 2024-02-04 DIAGNOSIS — Z79891 Long term (current) use of opiate analgesic: Secondary | ICD-10-CM | POA: Insufficient documentation

## 2024-02-04 DIAGNOSIS — G8929 Other chronic pain: Secondary | ICD-10-CM | POA: Insufficient documentation

## 2024-02-04 DIAGNOSIS — M549 Dorsalgia, unspecified: Secondary | ICD-10-CM | POA: Diagnosis not present

## 2024-02-04 DIAGNOSIS — Z9079 Acquired absence of other genital organ(s): Secondary | ICD-10-CM | POA: Diagnosis not present

## 2024-02-04 DIAGNOSIS — Z8544 Personal history of malignant neoplasm of other female genital organs: Secondary | ICD-10-CM | POA: Insufficient documentation

## 2024-02-04 DIAGNOSIS — C519 Malignant neoplasm of vulva, unspecified: Secondary | ICD-10-CM | POA: Diagnosis not present

## 2024-02-04 DIAGNOSIS — B977 Papillomavirus as the cause of diseases classified elsewhere: Secondary | ICD-10-CM | POA: Insufficient documentation

## 2024-02-04 NOTE — Progress Notes (Signed)
 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 Surveillance.  Subjective:  Lorraine Jones is a 73 y.o. female who is seen in consultation from Dr. Luster Salters for vulvar cancer.   09/03/22 - Underwent right hemi-vulvectomy and bilateral sentinel lymph node mapping and biopsies at [facility].  Final pathology showed clear margins and negative nodes.   When we saw her two months ago part of the incision had opened and was healing by secondary intention.   No complaints today.  Using Fentanyl  patch for chronic back pain and walks with cane.    Gyn Oncology history Seen by Dr Luster Salters 07/24/22.   Vulvar itching and irritation and noticed a blister on her labia.  She went to her PCP who did HSV and RPR which have proven to be negative.  She had some discharge. She reports no previous issues with abnormal Pap smears. Exam: Right labial lesion with leukoplakia and discolored draining area.  Rugated and thickened.   A.   VULVA, BIOPSY:  -      INVASIVE SQUAMOUS CELL CARCINOMA, WELL-DIFFERENTIATED.   B.   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   Some SI and Hip DJD and walks with cane.    09/03/22 - Underwent right hemi-vulvectomy and bilateral sentinel lymph node mapping and biopsies at [facility].  Final pathology showed clear margins and negative nodes.    A. 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 Depth of invasion: 4 mm. Margins of resection are negative for invasive and in situ carcinoma, see additional final margins below. B. Right vulvar medial margin, excision: Skin, negative for malignancy. C. Right lateral vulvar margin, excision: Skin, negative for malignancy. D. Right inguinal sentinel lymph node, green and hot, lymphadenectomy: One lymph node,  negative for malignancy (0/1). E. Left inguinal sentinel lymph node, green and blue, lymphadenectomy: Two lymph nodes, negative for malignancy (0/2).  TUMOR    Tumor Focality:    Unifocal     Tumor Site:    Right vulva       :    Labium majus       :    Labium minus     Tumor Size:    Greatest Dimension (Centimeters): 3 cm     Histologic Type:    Squamous cell carcinoma, HPV-associated     Histologic Grade:    G2, moderately differentiated     Depth of Tumor Invasion:    4 mm     Tumor Border:    Infiltrating     Other Tissue / Organ Involvement:    Not applicable     Lymphovascular Invasion:    Not identified   MARGINS    Margin Status for Invasive Carcinoma:    All margins negative for invasive carcinoma       Closest Margin(s) to Invasive Carcinoma:    Deep: At 6:00 margin       Distance from Invasive Carcinoma to Closest Margin:    3 mm     Margin Status for HSIL (VIN2-3) or dVIN:    All margins negative for high-grade squamous intraepithelial lesion (HSIL) and / or differentiated vulvar intraepithelial neoplasia (dVIN)    Problem List: Patient Active Problem List   Diagnosis Date Noted   History of cancer of vulva 11/24/2023   Major depression in  remission (HCC) 11/24/2023   Vulvar cancer (HCC) 08/07/2022   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) 04/06/2019   Primary osteoarthritis of both hips 02/06/2018   Hypertension associated with diabetes (HCC) 07/23/2017   Dyslipidemia associated with type 2 diabetes mellitus (HCC) 07/23/2017   Major depression, recurrent (HCC) 06/25/2016   Osteopenia after menopause 04/18/2016   Gastroesophageal reflux disease without esophagitis 05/12/2015   Chronic pain 05/12/2015   Hyperlipidemia 05/12/2015   Lumbar post-laminectomy syndrome 02/27/2015   Neuropathy due to secondary diabetes (HCC) 02/27/2015   Spinal stenosis, lumbar region, with neurogenic  claudication 02/27/2015   Sacroiliac joint dysfunction of both sides 02/27/2015   Degenerative joint disease (DJD) of hip 02/27/2015   DDD (degenerative disc disease), cervical 02/27/2015   Bilateral occipital neuralgia 02/27/2015   Chronic constipation 06/02/2014    Past Medical History: Past Medical History:  Diagnosis Date   Arthritis    Chronic low back pain    Depression    Diabetes mellitus    Hyperlipidemia    Hypertension     Past Surgical History: Past Surgical History:  Procedure Laterality Date   CHOLECYSTECTOMY  09/30/1994   CYSTOSCOPY WITH URETEROSCOPY Right 09/01/2013   Procedure: CYSTOSCOPY WITH UNROOFING  RIGHT URETEROCELE AND URETERAL STONE REMOVED  WITH GYRUS COLLINS KNIFE. ;  Surgeon: Homero Luster, MD;  Location: [facility];  Service: Urology;  Laterality: Right;  cysto, right uretersoscopy and stone extraction   collins knife   LUMBAR DISC SURGERY  10/01/1991   L4  --  L5   VULVECTOMY PARTIAL  09/11/2022      OB History:  OB History  Gravida Para Term Preterm AB Living  3 3 3   3   SAB IAB Ectopic Multiple Live Births      3    # Outcome Date GA Lbr Len/2nd Weight Sex Type Anes PTL Lv  3 Term 1982     Vag-Spont   LIV  2 Term 1974     Vag-Spont   LIV  1 Term 63     Vag-Spont   LIV    Family History: Family History  Problem Relation Age of Onset   Diabetes Mother    Heart disease Mother    Hypertension Mother    Breast cancer Neg Hx     Social History: Social History   Socioeconomic History   Marital status: Married    Spouse name: Not on file   Number of children: 3   Years of education: Not on file   Highest education level: 12th grade  Occupational History   Occupation: retired  Tobacco Use   Smoking status: Every Day    Current packs/day: 0.50    Average packs/day: 0.5 packs/day for 20.0 years (10.0 ttl pk-yrs)    Types: Cigarettes   Smokeless tobacco: Never   Tobacco comments:    she is cutting down    Vaping Use   Vaping status: Never Used  Substance and Sexual Activity   Alcohol use: No    Alcohol/week: 0.0 standard drinks of alcohol   Drug use: No   Sexual activity: Not Currently    Partners: Male    Birth control/protection: Post-menopausal  Other Topics Concern   Not on file  Social History Narrative   Not on file   Social Drivers of Health   Financial Resource Strain: Low Risk  (09/29/2023)   Received from [facility]  Overall Financial Resource Strain (CARDIA)    Difficulty of Paying Living Expenses: Not hard at all  Food Insecurity: No Food Insecurity (09/29/2023)   Received from [facility]   Hunger Vital Sign    Worried About Running Out of Food in the Last Year: Never true    Ran Out of Food in the Last Year: Never true  Transportation Needs: No Transportation Needs (09/29/2023)   Received from [facility] - Transportation    In the past 12 months, has lack of transportation kept you from medical appointments or from getting medications?: No    Lack of Transportation (Non-Medical): No  Physical Activity: Inactive (09/29/2023)   Received from [facility]   Exercise Vital Sign    Days of Exercise per Week: 0 days    Minutes of Exercise per Session: 0 min  Stress: No Stress Concern Present (09/29/2023)   Received from [facility] - Occupational Stress Questionnaire    Feeling of Stress : Not at all  Social Connections: Moderately Isolated (09/29/2023)   Received from [facility]   Social Connection and Isolation Panel [NHANES]    Frequency of Communication with Friends and Family: Once a week    Frequency of Social Gatherings with Friends and Family: Once a week    Attends Religious Services: Never    Database administrator or Organizations: Yes    Attends Banker Meetings: 1 to 4 times  per year    Marital Status: Married  Catering manager Violence: Not At Risk (08/14/2023)   Humiliation, Afraid, Rape, and Kick questionnaire    Fear of Current or Ex-Partner: No    Emotionally Abused: No    Physically Abused: No    Sexually Abused: No    Allergies: Allergies  Allergen Reactions   Diclofenac Sodium Swelling   Naproxen Sodium Swelling   Etodolac Swelling   Gabapentin Swelling   Naproxen Swelling    Current Medications: Current Outpatient Medications  Medication Sig Dispense Refill   aspirin  EC 81 MG tablet Take 81 mg by mouth daily.     Blood Glucose Monitoring Suppl (ONETOUCH VERIO) w/Device KIT      carvedilol  (COREG ) 3.125 MG tablet Take 1 tablet (3.125 mg total) by mouth 2 (two) times daily. 180 tablet 1   dorzolamide -timolol  (COSOPT ) 22.3-6.8 MG/ML ophthalmic solution Place 1 drop into both eyes 2 (two) times daily.     fentaNYL  (DURAGESIC  - DOSED MCG/HR) 75 MCG/HR Apply 2 patches to skin every 2 days if tolerated.   NOTE: Do not apply 100 g per hour fentanyl  patch since insurance will not approve 100 g patch for you 30 patch 0   glucose blood test strip 1 strip by Other route 2 (two) times daily.     metFORMIN  (GLUCOPHAGE ) 1000 MG tablet Take 1 tablet (1,000 mg total) by mouth 2 (two) times daily with a meal. 180 tablet 1   metoCLOPramide  (REGLAN ) 5 MG tablet TAKE 1 TABLET (5 MG TOTAL) BY MOUTH EVERY 6 (SIX) HOURS AS NEEDED. FOR NAUSEA 90 tablet 0   Multiple Vitamins-Minerals (MULTIVITAMIN WITH MINERALS) tablet Take 1 tablet by mouth daily.     NOVOLIN N RELION 100 UNIT/ML injection INJECT 28 UNITS SUBCUTANEOUSLY TWO TIMES DAILY.     NOVOLIN R RELION 100 UNIT/ML injection INJECT 20 UNITS SUBCUTANEOUSLY THREE TIMES DAILY BEFORE MEAL(S) (ADD SLIDING SCALE AS NECESSARY)  Olmesartan -amLODIPine -HCTZ 40-5-25 MG TABS Take 1 tablet by mouth daily. 90 tablet 1   Oxycodone  HCl 20 MG TABS LIMIT 1/2 TO 1 TABLET BY MOUTH 3 TO 5 TIMES PER DAY IF TOLERATED *NOTE TABLET  IS TWICE AS STRONG*     polyethylene glycol (MIRALAX  / GLYCOLAX ) packet Take 17 g by mouth daily. 14 each 0   pravastatin  (PRAVACHOL ) 40 MG tablet Take 1 tablet (40 mg total) by mouth daily. 90 tablet 1   venlafaxine  XR (EFFEXOR -XR) 150 MG 24 hr capsule Take 1 capsule (150 mg total) by mouth daily with breakfast. 90 capsule 1   cetirizine  (ZYRTEC ) 10 MG tablet Take 1 tablet (10 mg total) by mouth at bedtime. (Patient not taking: Reported on 08/14/2023) 30 tablet 11   No current facility-administered medications for this visit.   Review of Systems General: negative for, fevers, chills, fatigue, changes in sleep, changes in weight or appetite Skin: negative for changes in color, texture, moles or lesions Eyes: negative for, changes in vision, pain, diplopia HEENT: negative for, change in hearing, pain, discharge, tinnitus, vertigo, voice changes, sore throat, neck masses Pulmonary: negative for, dyspnea, orthopnea, productive cough Cardiac: negative for, palpitations, syncope, pain, discomfort, pressure Gastrointestinal: negative for, dysphagia, nausea, vomiting, jaundice, pain, constipation, diarrhea, hematemesis, hematochezia Genitourinary/Sexual: negative for, dysuria, discharge, hesitancy, nocturia, retention, stones, infections, STD's, incontinence Ob/Gyn: negative for, irregular bleeding, pain Musculoskeletal: hip joints and SI joints with DJD and decreased mobility. Hematology: negative for, easy bruising, bleeding Neurologic/Psych: negative for, headaches, seizures, paralysis, weakness, tremor, change in gait, change in sensation, mood swings, depression, anxiety, change in memory  Objective:  Physical Examination:  BP (!) 141/66   Pulse 74   Temp 98.6 F (37 C)   Resp 20   Wt 158 lb 6.4 oz (71.8 kg)   SpO2 100%   BMI 29.93 kg/m    ECOG Performance Status: 1 - Symptomatic but completely ambulatory  General appearance: alert, cooperative, and appears stated  age HEENT:PERRLA, neck supple with midline trachea, and thyroid  without masses Lymph node survey: non-palpable, axillary, inguinal, supraclavicular Cardiovascular: regular rate and rhythm, no murmurs or gallops Respiratory: normal air entry, lungs clear to auscultation. Abdomen: soft, non-tender, without masses or organomegaly and no hernias Back: inspection of back is normal Extremities: extremities normal, atraumatic, no cyanosis or edema. Limited mobility in hips. Skin exam - normal coloration and turgor, no rashes, no suspicious skin lesions noted. Neurological exam reveals alert, oriented, normal speech, no focal findings or movement disorder noted.  Pelvic: exam chaperoned by nurse;  Vulva: right vulva healed completely but pink due to healing by secondary intention after wound opened, Vagina: normal vagina, some discharge; Adnexa: normal adnexa in size, nontender and no masses; Uterus: uterus is normal size, shape, consistency and nontender; Cervix: anteverted, no lesions; Rectal: not indicated Groins negative for palpable nodes., Incisions healing well.     Lab Review Lab Results  Component Value Date   WBC 9.4 08/30/2022   HGB 13.5 08/30/2022   HCT 42 08/30/2022   MCV 89.3 06/28/2019   PLT 289 08/30/2022     Chemistry      Component Value Date/Time   NA 140 12/17/2022 0000   K 3.9 12/17/2022 0000   CL 104 12/17/2022 0000   CO2 30 (A) 12/17/2022 0000   BUN 12 12/17/2022 0000   CREATININE 1.0 12/17/2022 0000   CREATININE 2.24 (H) 12/12/2021 1121   GLU 123 12/17/2022 0000      Component Value Date/Time  CALCIUM  9.6 12/17/2022 0000   ALKPHOS 123 12/17/2022 0000   AST 16 12/17/2022 0000   ALT 17 12/17/2022 0000   BILITOT 0.2 12/12/2021 1121   BILITOT <0.2 05/12/2015 1010        Assessment:  Lorraine Jones is a 73 y.o. 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  Underwent right hemi-vulvectomy and bilateral sentinel lymph node  mapping and biopsies at [facility]12/5/23.  Final pathology showed 4 mm invasion with clear margins and negative nodes.  Part of the incision opened and healed by secondary intention.  NED.  We recommended no further therapy in view of negative margins and nodes.    Medical co-morbidities complicating care: HTN, AODM/insulin , SI and hip joint DJD with reduced mobility and uses cane, BMI 36.  Plan:   Problem List Items Addressed This Visit       Genitourinary   Vulvar cancer (HCC) - Primary    She will RTC in 6 months or sooner should any concerning symptoms arise.   The patient's diagnosis, an outline of the further diagnostic and laboratory studies which will be required, the recommendation, and alternatives were discussed.  All questions were answered to the patient's satisfaction.  Hermine Loots, MD  CC:  Arleen Lacer, MD [address] [place],  [place] [address] [phone]

## 2024-02-25 ENCOUNTER — Other Ambulatory Visit: Payer: Self-pay | Admitting: Family Medicine

## 2024-03-10 ENCOUNTER — Other Ambulatory Visit: Payer: Self-pay | Admitting: Family Medicine

## 2024-03-10 DIAGNOSIS — I152 Hypertension secondary to endocrine disorders: Secondary | ICD-10-CM

## 2024-03-10 DIAGNOSIS — I10 Essential (primary) hypertension: Secondary | ICD-10-CM

## 2024-03-10 MED ORDER — CARVEDILOL 3.125 MG PO TABS: 3.1250 mg | ORAL_TABLET | Freq: Two times a day (BID) | ORAL | 0 refills | Status: DC

## 2024-03-10 NOTE — Addendum Note (Signed)
 Addended by: Arleen Lacer F on: 03/10/2024 09:11 AM   Modules accepted: Orders

## 2024-03-17 ENCOUNTER — Other Ambulatory Visit: Payer: Self-pay | Admitting: Family Medicine

## 2024-03-19 ENCOUNTER — Ambulatory Visit: Payer: Medicare Other | Admitting: Family Medicine

## 2024-03-19 ENCOUNTER — Encounter: Payer: Self-pay | Admitting: Family Medicine

## 2024-03-19 VITALS — BP 132/74 | HR 97 | Resp 16 | Ht 61.0 in | Wt 153.7 lb

## 2024-03-19 DIAGNOSIS — R198 Other specified symptoms and signs involving the digestive system and abdomen: Secondary | ICD-10-CM

## 2024-03-19 DIAGNOSIS — E1169 Type 2 diabetes mellitus with other specified complication: Secondary | ICD-10-CM | POA: Diagnosis not present

## 2024-03-19 DIAGNOSIS — E1143 Type 2 diabetes mellitus with diabetic autonomic (poly)neuropathy: Secondary | ICD-10-CM

## 2024-03-19 DIAGNOSIS — Z780 Asymptomatic menopausal state: Secondary | ICD-10-CM

## 2024-03-19 DIAGNOSIS — K3184 Gastroparesis: Secondary | ICD-10-CM

## 2024-03-19 DIAGNOSIS — E1159 Type 2 diabetes mellitus with other circulatory complications: Secondary | ICD-10-CM

## 2024-03-19 DIAGNOSIS — F325 Major depressive disorder, single episode, in full remission: Secondary | ICD-10-CM | POA: Diagnosis not present

## 2024-03-19 DIAGNOSIS — N1831 Chronic kidney disease, stage 3a: Secondary | ICD-10-CM | POA: Diagnosis not present

## 2024-03-19 DIAGNOSIS — G894 Chronic pain syndrome: Secondary | ICD-10-CM

## 2024-03-19 DIAGNOSIS — I152 Hypertension secondary to endocrine disorders: Secondary | ICD-10-CM

## 2024-03-19 DIAGNOSIS — E785 Hyperlipidemia, unspecified: Secondary | ICD-10-CM

## 2024-03-19 DIAGNOSIS — Z794 Long term (current) use of insulin: Secondary | ICD-10-CM

## 2024-03-19 DIAGNOSIS — M8580 Other specified disorders of bone density and structure, unspecified site: Secondary | ICD-10-CM

## 2024-03-19 DIAGNOSIS — M160 Bilateral primary osteoarthritis of hip: Secondary | ICD-10-CM

## 2024-03-19 MED ORDER — METOCLOPRAMIDE HCL 5 MG PO TABS: 5.0000 mg | ORAL_TABLET | Freq: Four times a day (QID) | ORAL | 0 refills | Status: DC | PRN

## 2024-03-19 MED ORDER — CARVEDILOL 3.125 MG PO TABS: 3.1250 mg | ORAL_TABLET | Freq: Two times a day (BID) | ORAL | 1 refills | Status: DC

## 2024-03-19 NOTE — Progress Notes (Signed)
 Name: PRESLEI Jones   MRN: [id number]    DOB: 03-17-1951   Date:03/19/2024       Progress Note  Subjective  Chief Complaint  Chief Complaint  Patient presents with   Medical Management of Chronic Issues   Diarrhea    About a month   Discussed the use of AI scribe software for clinical note transcription with the patient, who gave verbal consent to proceed.  History of Present Illness Lorraine Jones is a 73 year old female with type 2 diabetes, dyslipidemia, and chronic kidney disease who presents for a regular follow-up visit.  She manages her type 2 diabetes with a Damscom 7 device, metformin  1000 mg twice daily, and insulin  (32 units twice daily and pre-meal doses). Her last hemoglobin A1c was 8.2. She experiences no excessive thirst or urination, urinating every two hours, which she considers normal.  Her chronic kidney disease has progressed, with a GFR of 53  in March, down from 60 the previous year. Her urine albumin creatinine ratio was 64.9. She is currently on olmesartan , discussed SGL-2 agonists  She has a history of diabetes associated gastroparesis and takes metoclopramide  5 mg before meals twice daily. She has experienced diarrhea for the past six weeks, occurring once or twice daily, without blood in the stools. There have been no new medications or dietary changes.  She has a history of vulvar cancer, treated with a vulvectomy on the right side, with no radiation or chemotherapy. There has been no recurrence, and she sees her oncologist every six months.  Her depression is managed with venlafaxine , and she reports it is in remission, attributing her improved mood to spending time with her grandchild.  She has a history of smoking, currently smoking half a pack a day, down from a pack and a half in the past. She started smoking at age 33 and had a period of cessation before resuming.  She reports weight loss and a lack of appetite, which she attributes to diarrhea.     Patient Active Problem List   Diagnosis Date Noted   History of cancer of vulva 11/24/2023   Major depression in remission (HCC) 11/24/2023   Vulvar cancer (HCC) 08/07/2022   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) 04/06/2019   Primary osteoarthritis of both hips 02/06/2018   Hypertension associated with diabetes (HCC) 07/23/2017   Dyslipidemia associated with type 2 diabetes mellitus (HCC) 07/23/2017   Major depression, recurrent (HCC) 06/25/2016   Osteopenia after menopause 04/18/2016   Gastroesophageal reflux disease without esophagitis 05/12/2015   Chronic pain 05/12/2015   Hyperlipidemia 05/12/2015   Lumbar post-laminectomy syndrome 02/27/2015   Neuropathy due to secondary diabetes (HCC) 02/27/2015   Spinal stenosis, lumbar region, with neurogenic claudication 02/27/2015   Sacroiliac joint dysfunction of both sides 02/27/2015   Degenerative joint disease (DJD) of hip 02/27/2015   DDD (degenerative disc disease), cervical 02/27/2015   Bilateral occipital neuralgia 02/27/2015   Chronic constipation 06/02/2014    Past Surgical History:  Procedure Laterality Date   CHOLECYSTECTOMY  09/30/1994   CYSTOSCOPY WITH URETEROSCOPY Right 09/01/2013   Procedure: CYSTOSCOPY WITH UNROOFING  RIGHT URETEROCELE AND URETERAL STONE REMOVED  WITH GYRUS COLLINS KNIFE. ;  Surgeon: Homero Luster, MD;  Location: [facility] [place];  Service: Urology;  Laterality: Right;  cysto, right uretersoscopy and stone extraction   collins knife   LUMBAR DISC SURGERY  10/01/1991   L4  --  L5   VULVECTOMY PARTIAL  09/11/2022    Family History  Problem Relation Age of Onset   Diabetes Mother    Heart disease Mother    Hypertension Mother    Breast cancer Neg Hx     Social History   Tobacco Use   Smoking status: Every Day    Current packs/day: 0.50    Average packs/day: 0.5 packs/day for 20.0 years (10.0 ttl  pk-yrs)    Types: Cigarettes   Smokeless tobacco: Never   Tobacco comments:    she is cutting down   Substance Use Topics   Alcohol use: No    Alcohol/week: 0.0 standard drinks of alcohol     Current Outpatient Medications:    aspirin  EC 81 MG tablet, Take 81 mg by mouth daily., Disp: , Rfl:    Blood Glucose Monitoring Suppl (ONETOUCH VERIO) w/Device KIT, , Disp: , Rfl:    carvedilol  (COREG ) 3.125 MG tablet, Take 1 tablet (3.125 mg total) by mouth 2 (two) times daily., Disp: 180 tablet, Rfl: 0   cetirizine  (ZYRTEC ) 10 MG tablet, Take 1 tablet (10 mg total) by mouth at bedtime., Disp: 30 tablet, Rfl: 11   dorzolamide -timolol  (COSOPT ) 22.3-6.8 MG/ML ophthalmic solution, Place 1 drop into both eyes 2 (two) times daily., Disp: , Rfl:    fentaNYL  (DURAGESIC  - DOSED MCG/HR) 75 MCG/HR, Apply 2 patches to skin every 2 days if tolerated.   NOTE: Do not apply 100 g per hour fentanyl  patch since insurance will not approve 100 g patch for you, Disp: 30 patch, Rfl: 0   glucose blood test strip, 1 strip by Other route 2 (two) times daily., Disp: , Rfl:    metFORMIN  (GLUCOPHAGE ) 1000 MG tablet, Take 1 tablet (1,000 mg total) by mouth 2 (two) times daily with a meal., Disp: 180 tablet, Rfl: 1   metoCLOPramide  (REGLAN ) 5 MG tablet, TAKE 1 TABLET (5 MG TOTAL) BY MOUTH EVERY 6 (SIX) HOURS AS NEEDED. FOR NAUSEA, Disp: 90 tablet, Rfl: 0   Multiple Vitamins-Minerals (MULTIVITAMIN WITH MINERALS) tablet, Take 1 tablet by mouth daily., Disp: , Rfl:    NOVOLIN N RELION 100 UNIT/ML injection, INJECT 28 UNITS SUBCUTANEOUSLY TWO TIMES DAILY., Disp: , Rfl:    NOVOLIN R RELION 100 UNIT/ML injection, INJECT 20 UNITS SUBCUTANEOUSLY THREE TIMES DAILY BEFORE MEAL(S) (ADD SLIDING SCALE AS NECESSARY), Disp: , Rfl:    Olmesartan -amLODIPine -HCTZ 40-5-25 MG TABS, Take 1 tablet by mouth daily., Disp: 90 tablet, Rfl: 1   Oxycodone  HCl 20 MG TABS, LIMIT 1/2 TO 1 TABLET BY MOUTH 3 TO 5 TIMES PER DAY IF TOLERATED *NOTE TABLET IS  TWICE AS STRONG*, Disp: , Rfl:    polyethylene glycol (MIRALAX  / GLYCOLAX ) packet, Take 17 g by mouth daily., Disp: 14 each, Rfl: 0   pravastatin  (PRAVACHOL ) 40 MG tablet, Take 1 tablet (40 mg total) by mouth daily., Disp: 90 tablet, Rfl: 1   venlafaxine  XR (EFFEXOR -XR) 150 MG 24 hr capsule, Take 1 capsule (150 mg total) by mouth daily with breakfast., Disp: 90 capsule, Rfl: 1  Allergies  Allergen Reactions   Diclofenac Sodium Swelling   Naproxen Sodium Swelling   Etodolac Swelling   Gabapentin Swelling   Naproxen Swelling    I personally reviewed active problem list, medication list, allergies with the patient/caregiver today.   ROS  Ten systems reviewed and is negative except as mentioned in HPI    Objective Physical Exam  CONSTITUTIONAL: Patient appears well-developed and well-nourished. No distress. HEENT: Head atraumatic, normocephalic,  neck supple. CARDIOVASCULAR: Normal rate, regular rhythm and normal heart sounds. No murmur heard. No BLE edema. PULMONARY: Effort normal and breath sounds normal. No respiratory distress. ABDOMINAL: There is no tenderness or distention. MUSCULOSKELETAL:she uses a cane to assist with balance PSYCHIATRIC: Patient has a normal mood and affect. Behavior is normal. Judgment and thought content normal.  Vitals:   03/19/24 0937  BP: 132/74  Pulse: 97  Resp: 16  SpO2: 97%  Weight: 153 lb 11.2 oz (69.7 kg)  Height: 5' 1 (1.549 m)    Body mass index is 29.04 kg/m.   PHQ2/9:    03/19/2024    9:30 AM 11/24/2023    8:38 AM 08/14/2023    9:30 AM 07/22/2023    8:12 AM 02/19/2023    9:33 AM  Depression screen PHQ 2/9  Decreased Interest 0 0 0 0 0  Down, Depressed, Hopeless 0 0 0 0 0  PHQ - 2 Score 0 0 0 0 0  Altered sleeping 0 0 0 0 0  Tired, decreased energy 0 0 0 0 0  Change in appetite 0 0 0 0 0  Feeling bad or failure about yourself  0 0 0 0 0  Trouble concentrating 0 0 0 0 0  Moving slowly or fidgety/restless 0 0 0 0 0   Suicidal thoughts 0 0 0 0 0  PHQ-9 Score 0 0 0 0 0  Difficult doing work/chores Not difficult at all Not difficult at all       phq 9 is negative  Fall Risk:    03/19/2024    9:30 AM 11/24/2023    8:32 AM 08/14/2023    9:33 AM 07/22/2023    8:12 AM 02/19/2023    9:33 AM  Fall Risk   Falls in the past year? 0 0 0 0 0  Number falls in past yr: 0 0 0 0 0  Injury with Fall? 0 0 0 0 0  Risk for fall due to : No Fall Risks No Fall Risks No Fall Risks No Fall Risks No Fall Risks  Follow up Falls prevention discussed;Education provided;Falls evaluation completed Falls prevention discussed;Education provided;Falls evaluation completed Falls prevention discussed;Falls evaluation completed Falls prevention discussed Falls prevention discussed      Assessment & Plan Type 2 diabetes mellitus with complications Type 2 diabetes with dyslipidemia, hypertension, obesity, and gastroparesis. A1c previously 8.2%. - Continue metformin  1000 mg twice daily. - Continue insulin  regimen as prescribed. - Encourage bringing phone with Clarity app for glucose monitoring to appointments. - Discuss potential addition of SGLT2 inhibitor with endocrinologist for renal protection and glycemic control.  Dyslipidemia Dyslipidemia managed with pravastatin . - Continue pravastatin  40 mg daily. - Ensure pravastatin  prescription is up to date until August.  Hypertension Hypertension managed with carvedilol  and olmesartan . Current BP 132/74 mmHg. - Continue carvedilol  as prescribed. - Continue olmesartan  (Tribenzor) as prescribed.  Overweight Previously obese, now overweight with BMI <30. Recent weight loss noted.  Change in bowel movements/weight loss/change in appetite Diarrhea for 6 weeks no abdominal pain, blood in stools or change in medications - Refer to gastroenterology for evaluation of diarrhea and change in bowel movements.  Vulvar cancer Treated with right-sided vulvectomy. No recurrence. -  Continue follow-up with oncologist every six months.  Chronic pain syndrome Chronic pain syndrome with hip arthritis, managed by pain specialist. - Continue management with pain specialist.  Major depressive disorder, in remission Major depressive disorder in remission, managed with venlafaxine . - Continue venlafaxine  (Effexor ) as prescribed.  Osteopenia Osteopenia post-menopause, managed with vitamin D. - Continue vitamin D supplementation.  Tobacco use disorder Current smoking half a pack per day, desires to quit. - Encourage smoking cessation. Discuss potential benefits of quitting, including reduced risk of asthma for grandchild.

## 2024-04-11 ENCOUNTER — Other Ambulatory Visit: Payer: Self-pay | Admitting: Family Medicine

## 2024-04-11 DIAGNOSIS — E1143 Type 2 diabetes mellitus with diabetic autonomic (poly)neuropathy: Secondary | ICD-10-CM

## 2024-04-20 NOTE — Progress Notes (Signed)
 Order(s) created erroneously. Erroneous order ID: [id number]  Order moved by: CHART CORRECTION ANALYST SEVEN, IDENTITY  Order move date/time: 04/20/2024 10:56 AM  Source Patient: [id number]  Source Contact: 03/19/2024  Destination Patient: [id number]  Destination Contact: 08/02/2021

## 2024-05-09 ENCOUNTER — Other Ambulatory Visit: Payer: Self-pay | Admitting: Family Medicine

## 2024-05-09 DIAGNOSIS — K3184 Gastroparesis: Secondary | ICD-10-CM

## 2024-06-02 ENCOUNTER — Other Ambulatory Visit: Payer: Self-pay | Admitting: Family Medicine

## 2024-06-02 DIAGNOSIS — K3184 Gastroparesis: Secondary | ICD-10-CM

## 2024-06-21 ENCOUNTER — Other Ambulatory Visit: Payer: Self-pay | Admitting: Family Medicine

## 2024-06-21 DIAGNOSIS — E1169 Type 2 diabetes mellitus with other specified complication: Secondary | ICD-10-CM

## 2024-06-24 LAB — HEMOGLOBIN A1C: Hemoglobin A1C: 8.3

## 2024-06-29 ENCOUNTER — Other Ambulatory Visit: Payer: Self-pay | Admitting: Family Medicine

## 2024-06-29 DIAGNOSIS — I152 Hypertension secondary to endocrine disorders: Secondary | ICD-10-CM

## 2024-07-21 ENCOUNTER — Other Ambulatory Visit: Payer: Self-pay | Admitting: Family Medicine

## 2024-07-21 DIAGNOSIS — Z1231 Encounter for screening mammogram for malignant neoplasm of breast: Secondary | ICD-10-CM

## 2024-08-04 ENCOUNTER — Encounter: Payer: Self-pay | Admitting: Nurse Practitioner

## 2024-08-04 ENCOUNTER — Inpatient Hospital Stay: Attending: Obstetrics and Gynecology | Admitting: Nurse Practitioner

## 2024-08-04 VITALS — BP 159/63 | HR 82 | Temp 97.7°F | Ht <= 58 in | Wt 154.4 lb

## 2024-08-04 DIAGNOSIS — C519 Malignant neoplasm of vulva, unspecified: Secondary | ICD-10-CM | POA: Diagnosis not present

## 2024-08-04 DIAGNOSIS — B977 Papillomavirus as the cause of diseases classified elsewhere: Secondary | ICD-10-CM | POA: Diagnosis not present

## 2024-08-04 DIAGNOSIS — Z8544 Personal history of malignant neoplasm of other female genital organs: Secondary | ICD-10-CM | POA: Insufficient documentation

## 2024-08-04 DIAGNOSIS — Z08 Encounter for follow-up examination after completed treatment for malignant neoplasm: Secondary | ICD-10-CM

## 2024-08-04 NOTE — Progress Notes (Signed)
 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 Surveillance.  Subjective:  Lorraine Jones is a 73 y.o. 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   09/03/22 - Underwent right hemi-vulvectomy and bilateral sentinel lymph node mapping and biopsies at [facility] with Dr Mancil. Final pathology showed clear margins and negative nodes.    Post operatively, part of her incision had opened and was healing by secondary intention.  She has been NED since surgery.  No adjuvant treatment was recommended.  She has Chronic back pain and uses a cane for ambulation.  Uses a fentanyl  patch for pain.  Denies any vulvar complaints.       Gyn Oncology History Seen by Dr Janit 07/24/22. Vulvar itching and irritation and noticed a blister on her labia.  She went to her PCP who did HSV and RPR which have proven to be negative.  She had some discharge. She reports no previous issues with abnormal Pap smears. Exam: Right labial lesion with leukoplakia and discolored draining area.  Rugated and thickened.   A.   VULVA, BIOPSY:  -      INVASIVE SQUAMOUS CELL CARCINOMA, WELL-DIFFERENTIATED.   B.   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   Some SI and Hip DJD and walks with cane.    09/03/22 - Underwent right hemi-vulvectomy and bilateral sentinel lymph node mapping and biopsies at [facility].  Final pathology showed clear margins and negative nodes.    A. 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 Depth of invasion: 4 mm. Margins of resection are negative for invasive and in situ carcinoma, see additional final margins below. B. Right vulvar medial margin, excision: Skin, negative for malignancy. C. Right lateral vulvar margin,  excision: Skin, negative for malignancy. D. Right inguinal sentinel lymph node, green and hot, lymphadenectomy: One lymph node, negative for malignancy (0/1). E. Left inguinal sentinel lymph node, green and blue, lymphadenectomy: Two lymph nodes, negative for malignancy (0/2).  TUMOR    Tumor Focality:    Unifocal     Tumor Site:    Right vulva       :    Labium majus       :    Labium minus     Tumor Size:    Greatest Dimension (Centimeters): 3 cm     Histologic Type:    Squamous cell carcinoma, HPV-associated     Histologic Grade:    G2, moderately differentiated     Depth of Tumor Invasion:    4 mm     Tumor Border:    Infiltrating     Other Tissue / Organ Involvement:    Not applicable     Lymphovascular Invasion:    Not identified   MARGINS    Margin Status for Invasive Carcinoma:    All margins negative for invasive carcinoma       Closest Margin(s) to Invasive Carcinoma:    Deep: At 6:00 margin       Distance from Invasive Carcinoma to Closest Margin:    3 mm     Margin Status for HSIL (VIN2-3) or dVIN:    All margins negative for high-grade squamous intraepithelial lesion (HSIL) and / or differentiated vulvar intraepithelial neoplasia (dVIN)   No adjuvant treatment recommended in view  of negative margins and nodes.    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) 04/06/2019   Primary osteoarthritis of both hips 02/06/2018   Hypertension associated with diabetes (HCC) 07/23/2017   Dyslipidemia associated with type 2 diabetes mellitus (HCC) 07/23/2017   Major depression, recurrent 06/25/2016   Osteopenia after menopause 04/18/2016   Gastroesophageal reflux disease without esophagitis 05/12/2015   Chronic pain  05/12/2015   Hyperlipidemia 05/12/2015   Lumbar post-laminectomy syndrome 02/27/2015   Neuropathy due to secondary diabetes (HCC) 02/27/2015   Spinal stenosis, lumbar region, with neurogenic claudication 02/27/2015   Sacroiliac joint dysfunction of both sides 02/27/2015   Degenerative joint disease (DJD) of hip 02/27/2015   DDD (degenerative disc disease), cervical 02/27/2015   Bilateral occipital neuralgia 02/27/2015   Chronic constipation 06/02/2014    Past Medical History: Past Medical History:  Diagnosis Date   Arthritis    Chronic low back pain    Depression    Diabetes mellitus    Hyperlipidemia    Hypertension     Past Surgical History: Past Surgical History:  Procedure Laterality Date   CHOLECYSTECTOMY  09/30/1994   CYSTOSCOPY WITH URETEROSCOPY Right 09/01/2013   Procedure: CYSTOSCOPY WITH UNROOFING  RIGHT URETEROCELE AND URETERAL STONE REMOVED  WITH GYRUS COLLINS KNIFE. ;  Surgeon: Norleen Seltzer, MD;  Location: [facility] ;  Service: Urology;  Laterality: Right;  cysto, right uretersoscopy and stone extraction   collins knife   LUMBAR DISC SURGERY  10/01/1991   L4  --  L5   VULVECTOMY PARTIAL  09/11/2022     OB History:  OB History  Gravida Para Term Preterm AB Living  3 3 3   3   SAB IAB Ectopic Multiple Live Births      3    # Outcome Date GA Lbr Len/2nd Weight Sex Type Anes PTL Lv  3 Term 1982     Vag-Spont   LIV  2 Term 1974     Vag-Spont   LIV  1 Term 79     Vag-Spont   LIV    Family History: Family History  Problem Relation Age of Onset   Diabetes Mother    Heart disease Mother    Hypertension Mother    Breast cancer Neg Hx     Social History: Social History   Socioeconomic History   Marital status: Married    Spouse name: Not on file   Number of children: 3   Years of education: Not on file   Highest education level: 12th grade  Occupational History   Occupation: retired  Tobacco Use   Smoking status: Every Day     Current packs/day: 0.50    Average packs/day: 0.9 packs/day for 44.7 years (40.3 ttl pk-yrs)    Types: Cigarettes    Start date: 12/07/1977    Last attempt to quit: 12/07/2013   Smokeless tobacco: Never   Tobacco comments:    she is cutting down   Vaping Use   Vaping status: Never Used  Substance and Sexual Activity   Alcohol use: No    Alcohol/week: 0.0 standard drinks of alcohol   Drug use: No   Sexual activity: Not Currently    Partners: Male    Birth control/protection:  Post-menopausal  Other Topics Concern   Not on file  Social History Narrative   Not on file   Social Drivers of Health   Financial Resource Strain: Low Risk  (09/29/2023)   Received from [facility]   Overall Financial Resource Strain (CARDIA)    Difficulty of Paying Living Expenses: Not hard at all  Food Insecurity: No Food Insecurity (09/29/2023)   Received from [facility]   Hunger Vital Sign    Within the past 12 months, you worried that your food would run out before you got the money to buy more.: Never true    Within the past 12 months, the food you bought just didn't last and you didn't have money to get more.: Never true  Transportation Needs: No Transportation Needs (09/29/2023)   Received from [facility] - Transportation    In the past 12 months, has lack of transportation kept you from medical appointments or from getting medications?: No    Lack of Transportation (Non-Medical): No  Physical Activity: Inactive (09/29/2023)   Received from [facility]   Exercise Vital Sign    On average, how many days per week do you engage in moderate to strenuous exercise (like a brisk walk)?: 0 days    On average, how many minutes do you engage in exercise at this level?: 0 min  Stress: No Stress Concern Present (09/29/2023)   Received from [facility] -  Occupational Stress Questionnaire    Feeling of Stress : Not at all  Social Connections: Moderately Isolated (09/29/2023)   Received from [facility]   Social Connection and Isolation Panel    In a typical week, how many times do you talk on the phone with family, friends, or neighbors?: Once a week    How often do you get together with friends or relatives?: Once a week    How often do you attend church or religious services?: Never    Do you belong to any clubs or organizations such as church groups, unions, fraternal or athletic groups, or school groups?: Yes    How often do you attend meetings of the clubs or organizations you belong to?: 1 to 4 times per year    Are you married, widowed, divorced, separated, never married, or living with a partner?: Married  Intimate Partner Violence: Not At Risk (08/14/2023)   Humiliation, Afraid, Rape, and Kick questionnaire    Fear of Current or Ex-Partner: No    Emotionally Abused: No    Physically Abused: No    Sexually Abused: No    Allergies: Allergies  Allergen Reactions   Diclofenac Sodium Swelling   Naproxen Sodium Swelling   Etodolac Swelling   Gabapentin Swelling   Naproxen Swelling    Current Medications: Current Outpatient Medications  Medication Sig Dispense Refill   aspirin  EC 81 MG tablet Take 81 mg by mouth daily.     Blood Glucose Monitoring Suppl (ONETOUCH VERIO) w/Device KIT      carvedilol  (COREG ) 3.125 MG tablet Take 1 tablet (3.125 mg total) by mouth 2 (two) times daily. 180 tablet 1   cetirizine  (ZYRTEC ) 10 MG tablet Take 1 tablet (10 mg total) by mouth at bedtime. 30 tablet 11   dorzolamide -timolol  (COSOPT ) 22.3-6.8 MG/ML ophthalmic solution Place 1 drop into both eyes 2 (two) times daily.     fentaNYL  (DURAGESIC  - DOSED MCG/HR)  75 MCG/HR Apply 2 patches to skin every 2 days if tolerated.   NOTE: Do not apply 100 g per hour fentanyl  patch since insurance will not approve 100 g patch for you 30  patch 0   glucose blood test strip 1 strip by Other route 2 (two) times daily.     metFORMIN  (GLUCOPHAGE ) 1000 MG tablet Take 1 tablet (1,000 mg total) by mouth 2 (two) times daily with a meal. 180 tablet 1   metoCLOPramide  (REGLAN ) 5 MG tablet TAKE 1 TABLET (5 MG TOTAL) BY MOUTH EVERY 6 (SIX) HOURS AS NEEDED. FOR NAUSEA 90 tablet 0   Multiple Vitamins-Minerals (MULTIVITAMIN WITH MINERALS) tablet Take 1 tablet by mouth daily.     NOVOLIN N RELION 100 UNIT/ML injection INJECT 28 UNITS SUBCUTANEOUSLY TWO TIMES DAILY.     NOVOLIN R RELION 100 UNIT/ML injection INJECT 20 UNITS SUBCUTANEOUSLY THREE TIMES DAILY BEFORE MEAL(S) (ADD SLIDING SCALE AS NECESSARY)     Olmesartan -amLODIPine -HCTZ 40-5-25 MG TABS TAKE 1 TABLET BY MOUTH EVERY DAY 90 tablet 1   Oxycodone  HCl 20 MG TABS LIMIT 1/2 TO 1 TABLET BY MOUTH 3 TO 5 TIMES PER DAY IF TOLERATED *NOTE TABLET IS TWICE AS STRONG*     polyethylene glycol (MIRALAX  / GLYCOLAX ) packet Take 17 g by mouth daily. 14 each 0   pravastatin  (PRAVACHOL ) 40 MG tablet TAKE 1 TABLET BY MOUTH EVERY DAY 90 tablet 0   venlafaxine  XR (EFFEXOR -XR) 150 MG 24 hr capsule Take 1 capsule (150 mg total) by mouth daily with breakfast. 90 capsule 1   No current facility-administered medications for this visit.   Review of Systems General:  no complaints Skin: no complaints Eyes: no complaints HEENT: no complaints Breasts: no complaints Pulmonary: no complaints Cardiac: no complaints Gastrointestinal: no complaints Genitourinary/Sexual: no complaints Ob/Gyn: no complaints Musculoskeletal: chronic back pain. Hip arthritis  Hematology: no complaints Neurologic/Psych: no complaints   Objective:  Physical Examination:  BP (!) 159/63 (BP Location: Left Arm, Patient Position: Sitting)   Pulse 82   Temp 97.7 F (36.5 C) (Tympanic)   Ht 4' 7.51 (1.41 m)   Wt 154 lb 6.4 oz (70 kg)   SpO2 100%   BMI 35.23 kg/m    ECOG Performance Status: 1 - Symptomatic but completely  ambulatory  GENERAL: Patient is a well appearing female in no acute distress LUNGS:  Clear to auscultation bilaterally.  No wheezes or rhonchi. HEART:  Regular rate and rhythm. No murmur appreciated. ABDOMEN:  Soft, nontender.  Positive, normoactive bowel sounds. No organomegaly palpated. Fentanyl  patch in place. No inguinal adenopathy.  MSK:  limited ROM of lower extremities.  EXTREMITIES:  No peripheral edema.   SKIN:  Clear with no obvious rashes or skin changes. No nail dyscrasia. NEURO:  Nonfocal. Well oriented.  Appropriate affect.  Pelvic: exam chaperoned by nurse. Limited exam d/t hip mobility Vulva: right vulva healed completely but pink due to healing by secondary intention after wound opened. Left vulva normal appearing Vagina: normal vagina, some white/milky discharge Adnexa: normal adnexa in size, nontender and no masses;  Uterus: uterus is normal size, shape, consistency and nontender;  Cervix: anteverted. Unable to visualize posterior cervix. Palpates smooth w/o nodularity. Pap obtained.  Rectal: not indicated      Assessment:  Lorraine Jones is a 73 y.o. 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  Underwent right hemi-vulvectomy and bilateral sentinel lymph node mapping and biopsies at [facility] 09/03/22.  Final pathology showed  4 mm invasion with clear margins and negative nodes. No adjuvant treatment recommended in view of negative margins and nodes. Part of the incision opened and healed by secondary intention.  NED since. Clinically asymptomatic today. No evidence of recurrent disease on exam today.   Medical co-morbidities complicating care: HTN, AODM/insulin , SI and hip joint DJD with reduced mobility and uses cane, BMI 36.  Plan:   Problem List Items Addressed This Visit       Genitourinary   Vulvar cancer (HCC)   Relevant Orders   IGP, Aptima HPV   Other Visit Diagnoses       Encounter for follow-up surveillance of vulvar  cancer    -  Primary      She is nearly 2 years from completing treatment for her vulvar cancer. We recommended return to clinic for physical exam and pelvic in 6 months. We reviewed symptoms that would be concerning for recurrent disease and warrant sooner return. Pap/co-test collected today given HPV effect of her vulvar cancer. Would perform imaging as indicated based on symptoms or suspicious exam findings. Discussed survivorship topics including cancer screenings with her pcp.   She will RTC in 6 months or sooner should any concerning symptoms arise.   The patient's diagnosis, an outline of the further diagnostic and laboratory studies which will be required, the recommendation, and alternatives were discussed.  All questions were answered to the patient's satisfaction.  Tinnie KANDICE Dawn, NP  CC:  Glenard Mire, MD [address] [place],  [place] [address] [phone]

## 2024-08-11 LAB — IGP, APTIMA HPV: HPV Aptima: NEGATIVE

## 2024-08-19 ENCOUNTER — Ambulatory Visit: Payer: Self-pay

## 2024-08-19 VITALS — BP 130/68 | Ht 61.0 in | Wt 157.8 lb

## 2024-08-19 DIAGNOSIS — Z23 Encounter for immunization: Secondary | ICD-10-CM | POA: Diagnosis not present

## 2024-08-19 DIAGNOSIS — Z87891 Personal history of nicotine dependence: Secondary | ICD-10-CM

## 2024-08-19 DIAGNOSIS — Z0000 Encounter for general adult medical examination without abnormal findings: Secondary | ICD-10-CM

## 2024-08-19 NOTE — Addendum Note (Signed)
 Addended by: GWENN JHONNIE RAMAN on: 08/19/2024 10:34 AM   Modules accepted: Orders

## 2024-08-19 NOTE — Patient Instructions (Addendum)
 Ms. Glennie,  Thank you for taking the time for your Medicare Wellness Visit. I appreciate your continued commitment to your health goals. Please review the care plan we discussed, and feel free to reach out if I can assist you further.  Please note that Annual Wellness Visits do not include a physical exam. Some assessments may be limited, especially if the visit was conducted virtually. If needed, we may recommend an in-person follow-up with your provider.  Ongoing Care Seeing your primary care provider every 3 to 6 months helps us  monitor your health and provide consistent, personalized care.   Referrals If a referral was made during today's visit and you haven't received any updates within two weeks, please contact the referred provider directly to check on the status. REFERRAL SENT FOR LUNG CANCER SCREENING CONSULT  Recommended Screenings:  Health Maintenance  Topic Date Due   Zoster (Shingles) Vaccine (1 of 2) Never done   Screening for Lung Cancer  Never done   Colon Cancer Screening  03/19/2024   Cologuard (Stool DNA test)  04/11/2024   Flu Shot  04/30/2024   COVID-19 Vaccine (4 - 2025-26 season) 05/31/2024   Yearly kidney health urinalysis for diabetes  06/18/2024   Hemoglobin A1C  06/19/2024   Breast Cancer Screening  08/24/2024   Eye exam for diabetics  08/21/2024   Yearly kidney function blood test for diabetes  12/17/2024   Complete foot exam   12/17/2024   Medicare Annual Wellness Visit  08/19/2025   Osteoporosis screening with Bone Density Scan  08/22/2027   Pneumococcal Vaccine for age over 17  Completed   Hepatitis C Screening  Completed   Meningitis B Vaccine  Aged Out   DTaP/Tdap/Td vaccine  Discontinued    Vision: Annual vision screenings are recommended for early detection of glaucoma, cataracts, and diabetic retinopathy. These exams can also reveal signs of chronic conditions such as diabetes and high blood pressure.  Dental: Annual dental screenings help  detect early signs of oral cancer, gum disease, and other conditions linked to overall health, including heart disease and diabetes.  Please see the attached documents for additional preventive care recommendations.   NEXT AWV 09/01/25 @ 10:10 AM IN PERSON  Take care & I will see ya next year- Nelle Sayed

## 2024-08-19 NOTE — Progress Notes (Signed)
 No chief complaint on file.    Subjective:   Lorraine Jones is a 73 y.o. female who presents for a Medicare Annual Wellness Visit.  Allergies (verified) Diclofenac sodium, Naproxen sodium, Etodolac, Gabapentin, and Naproxen   History: Past Medical History:  Diagnosis Date   Arthritis    Chronic low back pain    Depression    Diabetes mellitus    Hyperlipidemia    Hypertension    Past Surgical History:  Procedure Laterality Date   CHOLECYSTECTOMY  09/30/1994   CYSTOSCOPY WITH URETEROSCOPY Right 09/01/2013   Procedure: CYSTOSCOPY WITH UNROOFING  RIGHT URETEROCELE AND URETERAL STONE REMOVED  WITH GYRUS COLLINS KNIFE. ;  Surgeon: Norleen Seltzer, MD;  Location: [facility];  Service: Urology;  Laterality: Right;  cysto, right uretersoscopy and stone extraction   collins knife   LUMBAR DISC SURGERY  10/01/1991   L4  --  L5   VULVECTOMY PARTIAL  09/11/2022   Family History  Problem Relation Age of Onset   Diabetes Mother    Heart disease Mother    Hypertension Mother    Breast cancer Neg Hx    Social History   Occupational History   Occupation: retired  Tobacco Use   Smoking status: Every Day    Current packs/day: 0.50    Average packs/day: 0.9 packs/day for 44.7 years (40.3 ttl pk-yrs)    Types: Cigarettes    Start date: 12/07/1977    Last attempt to quit: 12/07/2013   Smokeless tobacco: Never   Tobacco comments:    she is cutting down   Vaping Use   Vaping status: Never Used  Substance and Sexual Activity   Alcohol use: No    Alcohol/week: 0.0 standard drinks of alcohol   Drug use: No   Sexual activity: Not Currently    Partners: Male    Birth control/protection: Post-menopausal   Tobacco Counseling Ready to quit: Not Answered Counseling given: Not Answered Tobacco comments: she is cutting down   SDOH Screenings   Food Insecurity: No Food Insecurity (09/29/2023)   Received from [facility]  Housing: Low Risk  (12/13/2023)    Received from [facility]  Transportation Needs: No Transportation Needs (09/29/2023)   Received from [facility]  Utilities: Not At Risk (09/29/2023)   Received from [facility]  Alcohol Screen: Low Risk  (08/14/2023)  Depression (PHQ2-9): Low Risk  (03/19/2024)  Financial Resource Strain: Low Risk  (09/29/2023)   Received from [facility]  Physical Activity: Inactive (09/29/2023)   Received from [facility]  Social Connections: Moderately Isolated (09/29/2023)   Received from [facility]  Stress: No Stress Concern Present (09/29/2023)   Received from [facility]  Tobacco Use: High Risk (08/04/2024)  Health Literacy: Adequate Health Literacy (09/29/2023)   Received from [facility]   See flowsheets for full screening details  Depression Screen PHQ 2 & 9 Depression Scale- Over the past 2 weeks, how often have you been bothered by any of the following problems? Little interest or pleasure in doing things: 0 Feeling down, depressed, or hopeless (PHQ Adolescent also includes...irritable): 0 PHQ-2 Total Score: 0 Trouble falling or staying asleep, or sleeping too much: 0 Feeling tired or having little energy: 0 Poor appetite or overeating (PHQ Adolescent also includes...weight loss): 0 Feeling bad about yourself - or that you are a failure or have let yourself or your family  down: 0 Trouble concentrating on things, such as reading the newspaper or watching television [facility] Adolescent also includes...like school work): 0 Moving or speaking so slowly that other people could have noticed. Or the opposite - being so fidgety or restless that you have been moving around a lot more than usual: 0 Thoughts that you would be better off dead, or of hurting yourself in some way: 0 PHQ-9 Total Score: 0 If you checked off any problems, how difficult have these  problems made it for you to do your work, take care of things at home, or get along with other people?: Not difficult at all     Goals Addressed   None    Fall Screening Falls in the past year?: 0 Number of falls in past year: 0 Was there an injury with Fall?: 0 Fall Risk Category Calculator: 0 Patient Fall Risk Level: Low Fall Risk  Fall Risk Patient at Risk for Falls Due to: No Fall Risks Fall risk Follow up: Falls prevention discussed; Education provided; Falls evaluation completed  Advance Directives (For Healthcare) Does Patient Have a Medical Advance Directive?: No Would patient like information on creating a medical advance directive?: No - Patient declined        Objective:    There were no vitals filed for this visit. There is no height or weight on file to calculate BMI.  Current Medications (verified) Outpatient Encounter Medications as of 08/19/2024  Medication Sig   aspirin  EC 81 MG tablet Take 81 mg by mouth daily.   Blood Glucose Monitoring Suppl (ONETOUCH VERIO) w/Device KIT    carvedilol  (COREG ) 3.125 MG tablet Take 1 tablet (3.125 mg total) by mouth 2 (two) times daily.   cetirizine  (ZYRTEC ) 10 MG tablet Take 1 tablet (10 mg total) by mouth at bedtime.   dorzolamide -timolol  (COSOPT ) 22.3-6.8 MG/ML ophthalmic solution Place 1 drop into both eyes 2 (two) times daily.   fentaNYL  (DURAGESIC  - DOSED MCG/HR) 75 MCG/HR Apply 2 patches to skin every 2 days if tolerated.   NOTE: Do not apply 100 g per hour fentanyl  patch since insurance will not approve 100 g patch for you   glucose blood test strip 1 strip by Other route 2 (two) times daily.   LANTUS  SOLOSTAR 100 UNIT/ML Solostar Pen Inject into the skin daily.   metFORMIN  (GLUCOPHAGE ) 1000 MG tablet Take 1 tablet (1,000 mg total) by mouth 2 (two) times daily with a meal.   metoCLOPramide  (REGLAN ) 5 MG tablet TAKE 1 TABLET (5 MG TOTAL) BY MOUTH EVERY 6 (SIX) HOURS AS NEEDED. FOR NAUSEA   Multiple  Vitamins-Minerals (MULTIVITAMIN WITH MINERALS) tablet Take 1 tablet by mouth daily.   NOVOLIN N RELION 100 UNIT/ML injection INJECT 28 UNITS SUBCUTANEOUSLY TWO TIMES DAILY.   NOVOLIN R RELION 100 UNIT/ML injection INJECT 20 UNITS SUBCUTANEOUSLY THREE TIMES DAILY BEFORE MEAL(S) (ADD SLIDING SCALE AS NECESSARY)   Olmesartan -amLODIPine -HCTZ 40-5-25 MG TABS TAKE 1 TABLET BY MOUTH EVERY DAY   Oxycodone  HCl 20 MG TABS LIMIT 1/2 TO 1 TABLET BY MOUTH 3 TO 5 TIMES PER DAY IF TOLERATED *NOTE TABLET IS TWICE AS STRONG*   polyethylene glycol (MIRALAX  / GLYCOLAX ) packet Take 17 g by mouth daily.   pravastatin  (PRAVACHOL ) 40 MG tablet TAKE 1 TABLET BY MOUTH EVERY DAY   ULTICARE MICRO PEN NEEDLES 32G X 4 MM MISC 1 each by Other route.   venlafaxine  XR (EFFEXOR -XR) 150 MG 24 hr capsule Take 1 capsule (150 mg total) by mouth daily with breakfast.  No facility-administered encounter medications on file as of 08/19/2024.   Hearing/Vision screen No results found. Immunizations and Health Maintenance Health Maintenance  Topic Date Due   Zoster Vaccines- Shingrix  (1 of 2) Never done   Lung Cancer Screening  Never done   Colonoscopy  03/19/2024   Fecal DNA (Cologuard)  04/11/2024   Influenza Vaccine  04/30/2024   COVID-19 Vaccine (4 - 2025-26 season) 05/31/2024   Diabetic kidney evaluation - Urine ACR  06/18/2024   HEMOGLOBIN A1C  06/19/2024   Medicare Annual Wellness (AWV)  08/13/2024   OPHTHALMOLOGY EXAM  08/21/2024   Diabetic kidney evaluation - eGFR measurement  12/17/2024   FOOT EXAM  12/17/2024   Mammogram  08/24/2025   Pneumococcal Vaccine: 50+ Years  Completed   Bone Density Scan  Completed   Hepatitis C Screening  Completed   Meningococcal B Vaccine  Aged Out   DTaP/Tdap/Td  Discontinued        Assessment/Plan:  This is a routine wellness examination for [facility].  Patient Care Team: Sowles, Krichna, MD as PCP - General (Family Medicine) Cherilyn Debby CROME, MD as Consulting Physician  (Internal Medicine) Dannial Hacker, MD as Referring Physician (Pain Medicine) Maree Jannett POUR, MD as Consulting Physician (Neurology) Maurie Rayfield BIRCH, RN as Oncology Nurse Navigator [place], Marylen, OD (Optometry)  I have personally reviewed and noted the following in the patient's chart:   Medical and social history Use of alcohol, tobacco or illicit drugs  Current medications and supplements including opioid prescriptions. Functional ability and status Nutritional status Physical activity Advanced directives List of other physicians Hospitalizations, surgeries, and ER visits in previous 12 months Vitals Screenings to include cognitive, depression, and falls Referrals and appointments  No orders of the defined types were placed in this encounter.  In addition, I have reviewed and discussed with patient certain preventive protocols, quality metrics, and best practice recommendations. A written personalized care plan for preventive services as well as general preventive health recommendations were provided to patient.   Jhonnie GORMAN Das, LPN   [id number]   No follow-ups on file.  After Visit Summary: (In Person-Printed) AVS printed and given to the patient  Nurse Notes: FLU SHOT GIVEN; NEEDS SHINGRIX ; MAMMOGRAM SCHEDULED FOR 12/3; DID COLOGUARD IN MARCH THIS YEAR; UTD ON BDS; ORDERED LUNG CA CONSULT

## 2024-08-24 LAB — OPHTHALMOLOGY REPORT-SCANNED

## 2024-09-01 ENCOUNTER — Ambulatory Visit
Admission: RE | Admit: 2024-09-01 | Discharge: 2024-09-01 | Disposition: A | Discharge status: Home / Self Care | Source: Ambulatory Visit | Attending: Family Medicine | Admitting: Family Medicine

## 2024-09-01 DIAGNOSIS — Z1231 Encounter for screening mammogram for malignant neoplasm of breast: Secondary | ICD-10-CM | POA: Diagnosis present

## 2024-09-07 ENCOUNTER — Other Ambulatory Visit: Payer: Self-pay | Admitting: Family Medicine

## 2024-09-07 ENCOUNTER — Ambulatory Visit: Payer: Self-pay | Admitting: Family Medicine

## 2024-09-07 DIAGNOSIS — R928 Other abnormal and inconclusive findings on diagnostic imaging of breast: Secondary | ICD-10-CM

## 2024-09-10 ENCOUNTER — Ambulatory Visit: Payer: Self-pay | Admitting: Nurse Practitioner

## 2024-09-10 ENCOUNTER — Ambulatory Visit
Admission: RE | Admit: 2024-09-10 | Discharge: 2024-09-10 | Disposition: A | Source: Ambulatory Visit | Attending: Family Medicine | Admitting: Family Medicine

## 2024-09-10 DIAGNOSIS — R928 Other abnormal and inconclusive findings on diagnostic imaging of breast: Secondary | ICD-10-CM

## 2024-09-10 NOTE — Telephone Encounter (Signed)
 Called patient with results of pap which was NILM, HPV Negative. Review recommendation for cervical/vaginal cytology screening +/- hpv testing annually based on previous hpv effect of her vulvar cancer. Next pap in 07/2025.

## 2024-09-15 ENCOUNTER — Other Ambulatory Visit: Payer: Self-pay | Admitting: Family Medicine

## 2024-09-15 DIAGNOSIS — E1169 Type 2 diabetes mellitus with other specified complication: Secondary | ICD-10-CM

## 2024-09-17 NOTE — Telephone Encounter (Signed)
 Requested Prescriptions  Pending Prescriptions Disp Refills   pravastatin  (PRAVACHOL ) 40 MG tablet [Pharmacy Med Name: PRAVASTATIN  SODIUM 40 MG TAB] 90 tablet 0    Sig: TAKE 1 TABLET BY MOUTH EVERY DAY     Cardiovascular:  Antilipid - Statins Failed - 09/17/2024  1:27 PM      Failed - Lipid Panel in normal range within the last 12 months    Cholesterol, Total  Date Value Ref Range Status  05/12/2015 215 (H) 100 - 199 mg/dL Final   Cholesterol  Date Value Ref Range Status  12/17/2022 170 0 - 200 Final   LDL Cholesterol (Calc)  Date Value Ref Range Status  12/12/2021 70 mg/dL (calc) Final    Comment:    Reference range: <100 . Desirable range <100 mg/dL for primary prevention;   <70 mg/dL for patients with CHD or diabetic patients  with > or = 2 CHD risk factors. SABRA LDL-C is now calculated using the Martin-Hopkins  calculation, which is a validated novel method providing  better accuracy than the Friedewald equation in the  estimation of LDL-C.  Gladis APPLETHWAITE et al. SANDREA. 7986;689(80): 2061-2068  (http://education.[employer].com/faq/FAQ164)    LDL Cholesterol  Date Value Ref Range Status  12/18/2023 67  Final   HDL  Date Value Ref Range Status  12/18/2023 54 35 - 70 Final  05/12/2015 72 >39 mg/dL Final    Comment:    According to ATP-III Guidelines, HDL-C >59 mg/dL is considered a negative risk factor for CHD.    Triglycerides  Date Value Ref Range Status  12/18/2023 204 (A) 40 - 160 Final         Passed - Patient is not pregnant      Passed - Valid encounter within last 12 months    Recent Outpatient Visits           6 months ago Dyslipidemia associated with type 2 diabetes mellitus [facility] [place])   [place] [facility] Glenard Mire, MD   9 months ago Dyslipidemia associated with type 2 diabetes mellitus [facility] [place])   [place] [facility] Sowles, Krichna, MD       Future Appointments             In 4 days Sowles, Krichna, MD  [facility] [place]

## 2024-09-21 ENCOUNTER — Ambulatory Visit: Admitting: Family Medicine

## 2024-09-21 ENCOUNTER — Encounter: Payer: Self-pay | Admitting: Family Medicine

## 2024-09-21 VITALS — BP 124/74 | HR 88 | Resp 16 | Ht 61.0 in | Wt 157.2 lb

## 2024-09-21 DIAGNOSIS — E1122 Type 2 diabetes mellitus with diabetic chronic kidney disease: Secondary | ICD-10-CM

## 2024-09-21 DIAGNOSIS — N1831 Chronic kidney disease, stage 3a: Secondary | ICD-10-CM

## 2024-09-21 DIAGNOSIS — M8580 Other specified disorders of bone density and structure, unspecified site: Secondary | ICD-10-CM | POA: Diagnosis not present

## 2024-09-21 DIAGNOSIS — E1169 Type 2 diabetes mellitus with other specified complication: Secondary | ICD-10-CM | POA: Diagnosis not present

## 2024-09-21 DIAGNOSIS — F325 Major depressive disorder, single episode, in full remission: Secondary | ICD-10-CM

## 2024-09-21 DIAGNOSIS — I152 Hypertension secondary to endocrine disorders: Secondary | ICD-10-CM

## 2024-09-21 DIAGNOSIS — E1143 Type 2 diabetes mellitus with diabetic autonomic (poly)neuropathy: Secondary | ICD-10-CM

## 2024-09-21 DIAGNOSIS — Z1211 Encounter for screening for malignant neoplasm of colon: Secondary | ICD-10-CM | POA: Diagnosis not present

## 2024-09-21 DIAGNOSIS — I129 Hypertensive chronic kidney disease with stage 1 through stage 4 chronic kidney disease, or unspecified chronic kidney disease: Secondary | ICD-10-CM | POA: Diagnosis not present

## 2024-09-21 DIAGNOSIS — E785 Hyperlipidemia, unspecified: Secondary | ICD-10-CM

## 2024-09-21 DIAGNOSIS — Z780 Asymptomatic menopausal state: Secondary | ICD-10-CM | POA: Diagnosis not present

## 2024-09-21 DIAGNOSIS — E1159 Type 2 diabetes mellitus with other circulatory complications: Secondary | ICD-10-CM | POA: Diagnosis not present

## 2024-09-21 DIAGNOSIS — E538 Deficiency of other specified B group vitamins: Secondary | ICD-10-CM

## 2024-09-21 DIAGNOSIS — K3184 Gastroparesis: Secondary | ICD-10-CM

## 2024-09-21 MED ORDER — VENLAFAXINE HCL ER 150 MG PO CP24: 150.0000 mg | ORAL_CAPSULE | Freq: Every day | ORAL | 1 refills | Status: AC

## 2024-09-21 MED ORDER — CARVEDILOL 3.125 MG PO TABS: 3.1250 mg | ORAL_TABLET | Freq: Two times a day (BID) | ORAL | 1 refills | Status: AC

## 2024-09-21 MED ORDER — METOCLOPRAMIDE HCL 5 MG PO TABS: 5.0000 mg | ORAL_TABLET | Freq: Four times a day (QID) | ORAL | 1 refills | Status: AC | PRN

## 2024-09-21 MED ORDER — PRAVASTATIN SODIUM 40 MG PO TABS: 40.0000 mg | ORAL_TABLET | Freq: Every day | ORAL | 0 refills | Status: AC

## 2024-09-21 MED ORDER — OLMESARTAN-AMLODIPINE-HCTZ 40-5-25 MG PO TABS: 1.0000 | ORAL_TABLET | Freq: Every day | ORAL | 1 refills | Status: AC

## 2024-09-21 NOTE — Progress Notes (Signed)
 Name: Lorraine Jones   MRN: [id number]    DOB: 02/23/1951   Date:09/21/2024       Progress Note  Subjective  Chief Complaint  Chief Complaint  Patient presents with   Medical Management of Chronic Issues   Discussed the use of AI scribe software for clinical note transcription with the patient, who gave verbal consent to proceed.  History of Present Illness Lorraine Jones is a 73 year old female with type 2 diabetes who presents for a follow-up visit.  Her diabetes medication was recently adjusted from NPH insulin  to Lantus , with a current dose of 60 units. Her last A1c was 8.3% in September. She uses a glucose sensor, with average readings around 160 mg/dL, but experiences hypoglycemic episodes 2-3 times a week, typically around midday, with levels dropping to 53-54 mg/dL. She attributes these episodes to taking regular insulin  without eating breakfast.  She has a history of hypertension and is currently on olmesartan , amlodipine , and HCTZ. No chest pain, palpitations, or shortness of breath.  She has chronic kidney disease stage 3A and takes an ARB to protect her kidneys. She is unsure if her urine protein levels have been recently checked.  She has a history of dyslipidemia and is on rosuvastatin  20 mg daily. No muscle aches associated with this medication.  She has mild nonproliferative diabetic retinopathy, which is stable.  She has a history of vulvar cancer, treated with a right-side vulvectomy. She continues to follow up with her oncologist and had a Pap smear in November, which was negative for HPV.  She experiences chronic pain, primarily in her back, due to osteoarthritis. She is managed by a pain clinic and takes fentanyl  75 mcg every three days and oxycodone  20 mg for breakthrough pain.  She has a history of major depression, currently in remission, and takes venlafaxine  150 mg daily. She feels well, attributing her improved mood to spending time with her grandchild.  She  has diabetic gastroparesis and takes metoclopramide  once daily before her largest meal to manage nausea.  She is trying to reduce smoking and is awaiting a lung cancer screening appointment. She has osteopenia but is not currently on medication for it.  She is due for a colon cancer screening and prefers to use Cologuard.    Patient Active Problem List   Diagnosis Date Noted   History of cancer of vulva 11/24/2023   Major depression in remission 11/24/2023   Vulvar cancer (HCC) 08/07/2022   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) 04/06/2019   Primary osteoarthritis of both hips 02/06/2018   Hypertension associated with diabetes (HCC) 07/23/2017   Dyslipidemia associated with type 2 diabetes mellitus (HCC) 07/23/2017   Major depression, recurrent 06/25/2016   Osteopenia after menopause 04/18/2016   Gastroesophageal reflux disease without esophagitis 05/12/2015   Chronic pain 05/12/2015   Hyperlipidemia 05/12/2015   Lumbar post-laminectomy syndrome 02/27/2015   Neuropathy due to secondary diabetes (HCC) 02/27/2015   Spinal stenosis, lumbar region, with neurogenic claudication 02/27/2015   Sacroiliac joint dysfunction of both sides 02/27/2015   Degenerative joint disease (DJD) of hip 02/27/2015   DDD (degenerative disc disease), cervical 02/27/2015   Bilateral occipital neuralgia 02/27/2015   Chronic constipation 06/02/2014    Past Surgical History:  Procedure Laterality Date   CHOLECYSTECTOMY  09/30/1994   CYSTOSCOPY WITH URETEROSCOPY Right 09/01/2013   Procedure: CYSTOSCOPY WITH UNROOFING  RIGHT URETEROCELE AND URETERAL STONE REMOVED  WITH GYRUS  COLLINS KNIFE. ;  Surgeon: Lorraine Seltzer, MD;  Location: [facility];  Service: Urology;  Laterality: Right;  cysto, right uretersoscopy and stone extraction   collins knife   LUMBAR DISC SURGERY  10/01/1991   L4  --  L5   VULVECTOMY PARTIAL   09/11/2022    Family History  Problem Relation Age of Onset   Diabetes Mother    Heart disease Mother    Hypertension Mother    Breast cancer Neg Hx     Social History   Tobacco Use   Smoking status: Every Day    Current packs/day: 0.50    Average packs/day: 0.9 packs/day for 44.8 years (40.4 ttl pk-yrs)    Types: Cigarettes    Start date: 12/07/1977    Last attempt to quit: 12/07/2013   Smokeless tobacco: Never   Tobacco comments:    she is cutting down   Substance Use Topics   Alcohol use: No    Alcohol/week: 0.0 standard drinks of alcohol    Current Medications[1]  Allergies[2]  I personally reviewed active problem list, medication list, allergies with the patient/caregiver today.   ROS  Ten systems reviewed and is negative except as mentioned in HPI    Objective Physical Exam MEASUREMENTS: Weight- 157.2. CONSTITUTIONAL: Patient appears well-developed and well-nourished. No distress. HEENT: Head atraumatic, normocephalic, neck supple. CARDIOVASCULAR: Normal rate, regular rhythm and normal heart sounds. No murmur heard. No BLE edema. PULMONARY: Effort normal and breath sounds normal. No respiratory distress. ABDOMINAL: There is no tenderness or distention. MUSCULOSKELETAL: Normal gait. Without gross motor or sensory deficit. PSYCHIATRIC: Patient has a normal mood and affect. Behavior is normal. Judgment and thought content normal.  Vitals:   09/21/24 0757  BP: 124/74  Pulse: 88  Resp: 16  SpO2: 96%  Weight: 157 lb 3.2 oz (71.3 kg)  Height: 5' 1 (1.549 m)    Body mass index is 29.7 kg/m.  Recent Results (from the past 2160 hours)  Hemoglobin A1c     Status: None   Collection Time: 06/24/24 12:00 AM  Result Value Ref Range   Hemoglobin A1C 8.3   IGP, Aptima HPV     Status: None   Collection Time: 08/04/24 12:00 AM  Result Value Ref Range   Interpretation NILM     Comment: NEGATIVE FOR INTRAEPITHELIAL LESION OR MALIGNANCY.   Category NIL      Comment: Negative for Intraepithelial Lesion   Adequacy SECNI     Comment: Satisfactory for evaluation. No endocervical component is identified.   Performed by: Comment     Comment: Lorraine Jones, Cytologist (ASCP)   Note: Comment     Comment: The Pap smear is a screening test designed to aid in the detection of premalignant and malignant conditions of the uterine cervix.  It is not a diagnostic procedure and should not be used as the sole means of detecting cervical cancer.  Both false-positive and false-negative reports do occur.    Test Methodology Comment     Comment: This liquid based ThinPrep(R) pap test was interpreted using the Hologic(R) Genius(TM) Cervical Algorithm whole slide imaging system.    HPV Aptima Negative Negative    Comment: This nucleic acid amplification test detects fourteen high-risk HPV types (16,18,31,33,35,39,45,51,52,56,58,59,66,68) without differentiation.   OPHTHALMOLOGY REPORT-SCANNED     Status: Abnormal   Collection Time: 08/24/24  3:47 PM  Result Value Ref Range   HM Diabetic Eye Exam Retinopathy (A) No Retinopathy    Comment: Abstracted by HIM  A Comment       PHQ2/9:    09/21/2024    7:52 AM 08/19/2024   10:12 AM 03/19/2024    9:30 AM 11/24/2023    8:38 AM 08/14/2023    9:30 AM  Depression screen PHQ 2/9  Decreased Interest 0 0 0 0 0  Down, Depressed, Hopeless 0 0 0 0 0  PHQ - 2 Score 0 0 0 0 0  Altered sleeping 0 0 0 0 0  Tired, decreased energy 0 0 0 0 0  Change in appetite 0 0 0 0 0  Feeling bad or failure about yourself  0 0 0 0 0  Trouble concentrating 0 0 0 0 0  Moving slowly or fidgety/restless 0 0 0 0 0  Suicidal thoughts 0 0 0 0 0  PHQ-9 Score 0 0 0  0  0   Difficult doing work/chores Not difficult at all Not difficult at all Not difficult at all Not difficult at all      Data saved with a previous flowsheet row definition    phq 9 is negative  Fall Risk:    09/21/2024    7:52 AM 08/19/2024   10:05 AM 03/19/2024     9:30 AM 11/24/2023    8:32 AM 08/14/2023    9:33 AM  Fall Risk   Falls in the past year? 0 0 0 0 0  Number falls in past yr: 0 0 0 0 0  Injury with Fall? 0 0  0  0  0   Risk for fall due to : No Fall Risks No Fall Risks No Fall Risks No Fall Risks No Fall Risks  Follow up Falls evaluation completed Falls evaluation completed;Falls prevention discussed Falls prevention discussed;Education provided;Falls evaluation completed Falls prevention discussed;Education provided;Falls evaluation completed Falls prevention discussed;Falls evaluation completed     Data saved with a previous flowsheet row definition      Assessment & Plan Type 2 diabetes mellitus complicated by hypertension, dyslipidemia, chronic kidney disease stage 3a, gastroparesis, and mild nonproliferative diabetic retinopathy Type 2 diabetes with A1c of 8.3%. Hypoglycemia due to insulin  without meals. Retinopathy well-managed. CKD stage 3a with low kidney function. Gastroparesis managed with metoclopramide . No muscle pain from rosuvastatin . No symptoms from hypertension. - Increased Lantus  to 62 units; if morning glucose >140, increase to 64 units. - Ensure regular insulin  only with meals. - Continue metformin  and rosuvastatin . - Checked B12 levels and kidney function. - Ordered urine protein test. - Continue ARB for kidney protection. - Ordered Cologuard for colon cancer screening. - Encouraged dietary adjustments to prevent hypoglycemia.  Chronic pain due to osteoarthritis and degenerative spine disease Chronic back pain rated 5-6/10. Managed with fentanyl  and oxycodone . - Continue fentanyl  75 mcg every three days. - Use oxycodone  for breakthrough pain.  Major depressive disorder in remission Major depressive disorder in remission. Managed with venlafaxine  150 mg. No side effects. - Continue venlafaxine  150 mg daily.  Vitamin B12 deficiency Likely due to metformin  use. Previously advised B12 sublingually three times a  week. - Checked B12 levels. - Continue B12 supplementation as previously advised.  Osteopenia after menopause Osteopenia post-menopause. Bone mass last checked two years ago. - Plan for bone density scan with next mammogram in December.  History of vulvar cancer, status post right-sided vulvectomy History of vulvar cancer, status post right-sided vulvectomy. Recent screenings negative for HPV. No active disease. - Continue regular follow-up with oncologist.  General health maintenance Discussed lung and colon cancer screening. Lung  cancer screening pending. - Ordered Cologuard for colon cancer screening. - Encouraged lung cancer screening when scheduled.        [1]  Current Outpatient Medications:    aspirin  EC 81 MG tablet, Take 81 mg by mouth daily., Disp: , Rfl:    Blood Glucose Monitoring Suppl (ONETOUCH VERIO) w/Device KIT, , Disp: , Rfl:    cetirizine  (ZYRTEC ) 10 MG tablet, Take 1 tablet (10 mg total) by mouth at bedtime., Disp: 30 tablet, Rfl: 11   dorzolamide -timolol  (COSOPT ) 22.3-6.8 MG/ML ophthalmic solution, Place 1 drop into both eyes 2 (two) times daily., Disp: , Rfl:    fentaNYL  (DURAGESIC  - DOSED MCG/HR) 75 MCG/HR, Apply 2 patches to skin every 2 days if tolerated.   NOTE: Do not apply 100 g per hour fentanyl  patch since insurance will not approve 100 g patch for you, Disp: 30 patch, Rfl: 0   glucose blood test strip, 1 strip by Other route 2 (two) times daily., Disp: , Rfl:    LANTUS  SOLOSTAR 100 UNIT/ML Solostar Pen, Inject into the skin daily., Disp: , Rfl:    metFORMIN  (GLUCOPHAGE ) 1000 MG tablet, Take 1 tablet (1,000 mg total) by mouth 2 (two) times daily with a meal., Disp: 180 tablet, Rfl: 1   Multiple Vitamins-Minerals (MULTIVITAMIN WITH MINERALS) tablet, Take 1 tablet by mouth daily., Disp: , Rfl:    NOVOLIN N RELION 100 UNIT/ML injection, INJECT 28 UNITS SUBCUTANEOUSLY TWO TIMES DAILY., Disp: , Rfl:    NOVOLIN R RELION 100 UNIT/ML injection, INJECT 20 UNITS  SUBCUTANEOUSLY THREE TIMES DAILY BEFORE MEAL(S) (ADD SLIDING SCALE AS NECESSARY), Disp: , Rfl:    Oxycodone  HCl 20 MG TABS, LIMIT 1/2 TO 1 TABLET BY MOUTH 3 TO 5 TIMES PER DAY IF TOLERATED *NOTE TABLET IS TWICE AS STRONG*, Disp: , Rfl:    polyethylene glycol (MIRALAX  / GLYCOLAX ) packet, Take 17 g by mouth daily., Disp: 14 each, Rfl: 0   ULTICARE MICRO PEN NEEDLES 32G X 4 MM MISC, 1 each by Other route., Disp: , Rfl:    carvedilol  (COREG ) 3.125 MG tablet, Take 1 tablet (3.125 mg total) by mouth 2 (two) times daily., Disp: 180 tablet, Rfl: 1   metoCLOPramide  (REGLAN ) 5 MG tablet, Take 1 tablet (5 mg total) by mouth every 6 (six) hours as needed. for nausea, Disp: 90 tablet, Rfl: 1   Olmesartan -amLODIPine -HCTZ 40-5-25 MG TABS, Take 1 tablet by mouth daily., Disp: 90 tablet, Rfl: 1   pravastatin  (PRAVACHOL ) 40 MG tablet, Take 1 tablet (40 mg total) by mouth daily., Disp: 90 tablet, Rfl: 0   venlafaxine  XR (EFFEXOR -XR) 150 MG 24 hr capsule, Take 1 capsule (150 mg total) by mouth daily with breakfast., Disp: 90 capsule, Rfl: 1 [2]  Allergies Allergen Reactions   Diclofenac Sodium Swelling   Naproxen Sodium Swelling   Etodolac Swelling   Gabapentin Swelling   Naproxen Swelling

## 2024-09-22 LAB — B12 AND FOLATE PANEL
Folate: 5.7 ng/mL
Vitamin B-12: 717 pg/mL (ref 200–1100)

## 2024-09-22 LAB — MICROALBUMIN / CREATININE URINE RATIO
Creatinine, Urine: 104 mg/dL (ref 20–275)
Microalb Creat Ratio: 27 mg/g{creat}
Microalb, Ur: 2.8 mg/dL

## 2024-09-22 LAB — COMPREHENSIVE METABOLIC PANEL WITH GFR
AG Ratio: 1.5 (calc) (ref 1.0–2.5)
ALT: 44 U/L — ABNORMAL HIGH (ref 6–29)
AST: 26 U/L (ref 10–35)
Albumin: 4 g/dL (ref 3.6–5.1)
Alkaline phosphatase (APISO): 122 U/L (ref 37–153)
BUN/Creatinine Ratio: 26 (calc) — ABNORMAL HIGH (ref 6–22)
BUN: 50 mg/dL — ABNORMAL HIGH (ref 7–25)
CO2: 26 mmol/L (ref 20–32)
Calcium: 8.8 mg/dL (ref 8.6–10.4)
Chloride: 105 mmol/L (ref 98–110)
Creat: 1.93 mg/dL — ABNORMAL HIGH (ref 0.60–1.00)
Globulin: 2.7 g/dL (ref 1.9–3.7)
Glucose, Bld: 171 mg/dL — ABNORMAL HIGH (ref 65–99)
Potassium: 4.3 mmol/L (ref 3.5–5.3)
Sodium: 139 mmol/L (ref 135–146)
Total Bilirubin: 0.3 mg/dL (ref 0.2–1.2)
Total Protein: 6.7 g/dL (ref 6.1–8.1)
eGFR: 27 mL/min/1.73m2 — ABNORMAL LOW

## 2024-09-24 ENCOUNTER — Ambulatory Visit: Payer: Self-pay | Admitting: Family Medicine

## 2024-10-15 LAB — COLOGUARD: COLOGUARD: NEGATIVE

## 2024-11-02 ENCOUNTER — Encounter: Payer: Self-pay | Admitting: Gastroenterology

## 2024-11-03 ENCOUNTER — Ambulatory Visit: Admitting: Registered Nurse

## 2024-11-03 ENCOUNTER — Encounter: Admission: RE | Disposition: A | Payer: Self-pay | Source: Home / Self Care | Attending: Gastroenterology

## 2024-11-03 ENCOUNTER — Ambulatory Visit
Admission: RE | Admit: 2024-11-03 | Discharge: 2024-11-03 | Disposition: A | Source: Home / Self Care | Attending: Gastroenterology | Admitting: Gastroenterology

## 2024-11-03 ENCOUNTER — Encounter: Payer: Self-pay | Admitting: Gastroenterology

## 2024-11-03 LAB — GLUCOSE, CAPILLARY: Glucose-Capillary: 100 mg/dL — ABNORMAL HIGH (ref 70–99)

## 2024-11-03 MED ORDER — SODIUM CHLORIDE 0.9 % IV SOLN: INTRAVENOUS | Status: DC

## 2024-11-03 MED ORDER — PROPOFOL 500 MG/50ML IV EMUL
INTRAVENOUS | Status: DC | PRN
  Administered 2024-11-03: 150 ug/kg/min via INTRAVENOUS

## 2024-11-03 MED ORDER — PROPOFOL 1000 MG/100ML IV EMUL
INTRAVENOUS | Status: AC
  Filled 2024-11-03: qty 100

## 2024-11-03 MED ORDER — LIDOCAINE HCL (CARDIAC) PF 100 MG/5ML IV SOSY
PREFILLED_SYRINGE | INTRAVENOUS | Status: DC | PRN
  Administered 2024-11-03: 40 mg via INTRAVENOUS

## 2024-11-03 MED ORDER — PROPOFOL 10 MG/ML IV BOLUS
INTRAVENOUS | Status: DC | PRN
  Administered 2024-11-03: 70 mg via INTRAVENOUS

## 2024-11-03 NOTE — Anesthesia Postprocedure Evaluation (Signed)
"   Anesthesia Post Note  Patient: Lorraine Jones  Procedure(s) Performed: COLONOSCOPY COLONOSCOPY, WITH POLYPECTOMY BIOPSY, GI TRACT  Patient location during evaluation: PACU Anesthesia Type: General Level of consciousness: awake and alert Pain management: pain level controlled Vital Signs Assessment: post-procedure vital signs reviewed and stable Respiratory status: spontaneous breathing, nonlabored ventilation, respiratory function stable and patient connected to nasal cannula oxygen  Cardiovascular status: blood pressure returned to baseline and stable Postop Assessment: no apparent nausea or vomiting Anesthetic complications: no   No notable events documented.   Last Vitals:  Vitals:   11/03/24 0855 11/03/24 0905  BP: 136/60 (!) 149/61  Pulse: 81   Resp: (!) 25 14  Temp:    SpO2: 100% 100%    Last Pain:  Vitals:   11/03/24 0905  TempSrc:   PainSc: 0-No pain                 Lynwood KANDICE Clause      "

## 2024-11-03 NOTE — Anesthesia Preprocedure Evaluation (Signed)
"                                    Anesthesia Evaluation  Patient identified by MRN, date of birth, ID band Patient awake    Reviewed: Allergy & Precautions, H&P , NPO status , Patient's Chart, lab work & pertinent test results, reviewed documented beta blocker date and time   Airway Mallampati: II   Neck ROM: full    Dental  (+) Poor Dentition   Pulmonary neg pulmonary ROS, Current Smoker   Pulmonary exam normal        Cardiovascular Exercise Tolerance: Poor hypertension, On Medications negative cardio ROS Normal cardiovascular exam Rhythm:regular Rate:Normal     Neuro/Psych  Headaches PSYCHIATRIC DISORDERS  Depression     Neuromuscular disease    GI/Hepatic Neg liver ROS,GERD  Medicated,,  Endo/Other  negative endocrine ROSdiabetes, Well Controlled    Renal/GU negative Renal ROS  negative genitourinary   Musculoskeletal   Abdominal   Peds  Hematology negative hematology ROS (+)   Anesthesia Other Findings Past Medical History: No date: Arthritis No date: Chronic low back pain No date: Depression No date: Diabetes mellitus No date: Hyperlipidemia No date: Hypertension Past Surgical History: No date: BACK SURGERY 09/30/1994: CHOLECYSTECTOMY 09/01/2013: CYSTOSCOPY WITH URETEROSCOPY; Right     Comment:  Procedure: CYSTOSCOPY WITH UNROOFING  RIGHT URETEROCELE               AND URETERAL STONE REMOVED  WITH GYRUS COLLINS KNIFE. ;                Surgeon: Norleen Seltzer, MD;  Location: [facility];  Service: Urology;  Laterality: Right;  cysto,               right uretersoscopy and stone extraction   collins               knife 10/01/1991: LUMBAR DISC SURGERY     Comment:  L4  --  L5 09/11/2022: VULVECTOMY PARTIAL BMI    Body Mass Index: 29.21 kg/m     Reproductive/Obstetrics negative OB ROS                              Anesthesia Physical Anesthesia Plan  ASA: 3  Anesthesia Plan:  General   Post-op Pain Management:    Induction:   PONV Risk Score and Plan: 3  Airway Management Planned:   Additional Equipment:   Intra-op Plan:   Post-operative Plan:   Informed Consent: I have reviewed the patients History and Physical, chart, labs and discussed the procedure including the risks, benefits and alternatives for the proposed anesthesia with the patient or authorized representative who has indicated his/her understanding and acceptance.     Dental Advisory Given  Plan Discussed with: CRNA  Anesthesia Plan Comments:         Anesthesia Quick Evaluation  "

## 2024-11-03 NOTE — H&P (Signed)
 "                                                                                                                           Ruel Kung , MD [address], [place], [place], [address] Phone: [phone] Fax: [phone]  Primary Care Physician:  Glenard Mire, MD   Pre-Procedure History & Physical: HPI:  Lorraine Jones is a 74 y.o. female is here for an colonoscopy.   Past Medical History:  Diagnosis Date   Arthritis    Chronic low back pain    Depression    Diabetes mellitus    Hyperlipidemia    Hypertension     Past Surgical History:  Procedure Laterality Date   BACK SURGERY     CHOLECYSTECTOMY  09/30/1994   CYSTOSCOPY WITH URETEROSCOPY Right 09/01/2013   Procedure: CYSTOSCOPY WITH UNROOFING  RIGHT URETEROCELE AND URETERAL STONE REMOVED  WITH GYRUS COLLINS KNIFE. ;  Surgeon: Norleen Seltzer, MD;  Location: [facility];  Service: Urology;  Laterality: Right;  cysto, right uretersoscopy and stone extraction   collins knife   LUMBAR DISC SURGERY  10/01/1991   L4  --  L5   VULVECTOMY PARTIAL  09/11/2022    Prior to Admission medications  Medication Sig Start Date End Date Taking? Authorizing Provider  aspirin  EC 81 MG tablet Take 81 mg by mouth daily.   Yes [provider]  carvedilol  (COREG ) 3.125 MG tablet Take 1 tablet (3.125 mg total) by mouth 2 (two) times daily. 09/21/24  Yes Sowles, Krichna, MD  fentaNYL  (DURAGESIC  - DOSED MCG/HR) 75 MCG/HR Apply 2 patches to skin every 2 days if tolerated.   NOTE: Do not apply 100 g per hour fentanyl  patch since insurance will not approve 100 g patch for you 06/10/16  Yes Dannial Hacker, MD  LANTUS  SOLOSTAR 100 UNIT/ML Solostar Pen Inject into the skin daily.   Yes [provider]  metFORMIN  (GLUCOPHAGE ) 1000 MG tablet Take 1 tablet (1,000 mg total) by mouth 2 (two) times daily with a meal. 11/01/15  Yes Sowles, Krichna, MD  NOVOLIN N RELION 100 UNIT/ML injection INJECT 28 UNITS SUBCUTANEOUSLY  TWO TIMES DAILY. 10/29/18  Yes Cherilyn Debby CROME, MD  Olmesartan -amLODIPine -HCTZ 40-5-25 MG TABS Take 1 tablet by mouth daily. 09/21/24  Yes Sowles, Krichna, MD  Blood Glucose Monitoring Suppl (ONETOUCH VERIO) w/Device KIT  04/22/18   [provider]  cetirizine  (ZYRTEC ) 10 MG tablet Take 1 tablet (10 mg total) by mouth at bedtime. 09/27/22   Tapia, Leisa, PA-C  dorzolamide -timolol  (COSOPT ) 22.3-6.8 MG/ML ophthalmic solution Place 1 drop into both eyes 2 (two) times daily.    [provider]  glucose blood test strip 1 strip by Other route 2 (two) times daily. 02/14/17   Abisogun, Stefano Banter, MD  metoCLOPramide  (REGLAN ) 5 MG tablet Take 1 tablet (5 mg total) by mouth every 6 (six) hours as needed. for nausea 09/21/24   Sowles, Krichna, MD  Multiple Vitamins-Minerals (MULTIVITAMIN WITH MINERALS) tablet Take 1 tablet by mouth daily.    [provider]  NOVOLIN R RELION 100 UNIT/ML injection INJECT 20 UNITS SUBCUTANEOUSLY THREE TIMES DAILY BEFORE MEAL(S) (ADD SLIDING SCALE AS NECESSARY) 11/02/18   Cherilyn Debby CROME, MD  Oxycodone  HCl 20 MG TABS LIMIT 1/2 TO 1 TABLET BY MOUTH 3 TO 5 TIMES PER DAY IF TOLERATED *NOTE TABLET IS TWICE AS STRONG* 11/16/18   Dannial Hacker, MD  polyethylene glycol (MIRALAX  / GLYCOLAX ) packet Take 17 g by mouth daily. 06/03/14   Gabriel Noa, MD  pravastatin  (PRAVACHOL ) 40 MG tablet Take 1 tablet (40 mg total) by mouth daily. 09/21/24   Sowles, Krichna, MD  ULTICARE MICRO PEN NEEDLES 32G X 4 MM MISC 1 each by Other route. 06/24/24   [provider]  venlafaxine  XR (EFFEXOR -XR) 150 MG 24 hr capsule Take 1 capsule (150 mg total) by mouth daily with breakfast. 09/21/24   Glenard Mire, MD    Allergies as of 10/20/2024 - Review Complete 09/21/2024  Allergen Reaction Noted   Diclofenac sodium Swelling 10/14/2011   Naproxen sodium Swelling 10/14/2011   Etodolac Swelling 09/01/2013   Gabapentin Swelling 10/14/2011   Naproxen Swelling 11/30/2015     Family History  Problem Relation Age of Onset   Diabetes Mother    Heart disease Mother    Hypertension Mother    Breast cancer Neg Hx     Social History   Socioeconomic History   Marital status: Married    Spouse name: Not on file   Number of children: 3   Years of education: Not on file   Highest education level: 12th grade  Occupational History   Occupation: retired  Tobacco Use   Smoking status: Every Day    Current packs/day: 0.50    Average packs/day: 0.9 packs/day for 44.9 years (40.5 ttl pk-yrs)    Types: Cigarettes    Start date: 12/07/1977    Last attempt to quit: 12/07/2013   Smokeless tobacco: Never   Tobacco comments:    she is cutting down   Vaping Use   Vaping status: Never Used  Substance and Sexual Activity   Alcohol use: No    Alcohol/week: 0.0 standard drinks of alcohol   Drug use: No   Sexual activity: Not Currently    Partners: Male    Birth control/protection: Post-menopausal  Other Topics Concern   Not on file  Social History Narrative   Not on file   Social Drivers of Health   Tobacco Use: High Risk (11/03/2024)   Patient History    Smoking Tobacco Use: Every Day    Smokeless Tobacco Use: Never    Passive Exposure: Not on file  Financial Resource Strain: Low Risk  (10/18/2024)   Received from [facility]   Overall Financial Resource Strain (CARDIA)    Difficulty of Paying Living Expenses: Not hard at all  Food Insecurity: No Food Insecurity (10/18/2024)   Received from [facility]   Epic    Within the past 12 months, you worried that your food would run out before you got the money to buy more.: Never true    Within the past 12 months, the food you bought just didn't last and you didn't have money to get more.: Never true  Transportation Needs: No Transportation Needs (10/18/2024)   Received from [facility] - Transportation    In the past 12 months, has lack of  transportation kept you from medical appointments or from getting medications?: No    Lack of Transportation (Non-Medical): No  Physical Activity: Insufficiently Active (08/19/2024)   Exercise Vital Sign    Days of Exercise per Week: 2 days    Minutes of Exercise per Session: 30 min  Stress: No Stress Concern Present (08/19/2024)   [employer] - Occupational Stress Questionnaire    Feeling of Stress: Not at all  Social Connections: Moderately Integrated (08/19/2024)   Social Connection and Isolation Panel    Frequency of Communication with Friends and Family: Three times a week    Frequency of Social Gatherings with Friends and Family: Once a week    Attends Religious Services: More than 4 times per year    Active Member of Clubs or Organizations: No    Attends Banker Meetings: Never    Marital Status: Married  Catering Manager Violence: Not At Risk (08/19/2024)   Epic    Fear of Current or Ex-Partner: No    Emotionally Abused: No    Physically Abused: No    Sexually Abused: No  Depression (PHQ2-9): Low Risk (09/21/2024)   Depression (PHQ2-9)    PHQ-2 Score: 0  Alcohol Screen: Low Risk (08/14/2023)   Alcohol Screen    Last Alcohol Screening Score (AUDIT): 0  Housing: Low Risk  (10/18/2024)   Received from [facility]   Epic    In the last 12 months, was there a time when you were not able to pay the mortgage or rent on time?: No    In the past 12 months, how many times have you moved where you were living?: 0    At any time in the past 12 months, were you homeless or living in a shelter (including now)?: No  Utilities: Not At Risk (10/18/2024)   Received from [facility]   Epic    In the past 12 months has the electric, gas, oil, or water company threatened to shut off services in your home?: No  Health Literacy: Adequate Health Literacy (08/19/2024)   B1300 Health Literacy    Frequency of need for  help with medical instructions: Never    Review of Systems: See HPI, otherwise negative ROS  Physical Exam: BP 126/69   Pulse 71   Temp (!) 96.5 F (35.8 C) (Temporal)   Resp 16   Wt 70.1 kg   SpO2 100%   BMI 29.21 kg/m  General:   Alert,  pleasant and cooperative in NAD Head:  Normocephalic and atraumatic. Neck:  Supple; no masses or thyromegaly. Lungs:  Clear throughout to auscultation, normal respiratory effort.    Heart:  +S1, +S2, Regular rate and rhythm, No edema. Abdomen:  Soft, nontender and nondistended. Normal bowel sounds, without guarding, and without rebound.   Neurologic:  Alert and  oriented x4;  grossly normal neurologically.  Impression/Plan: Lorraine Jones is here for an colonoscopy to be performed for change in bowel habits, diarrhea. Risks, benefits, limitations, and alternatives regarding  colonoscopy have been reviewed with the patient.  Questions have been answered.  All parties agreeable.   Ruel Kung, MD  11/03/2024, 8:16 AM  "

## 2024-11-03 NOTE — Anesthesia Procedure Notes (Signed)
 Date/Time: 11/03/2024 8:30 AM  Performed by: Tod Handing, CRNAPre-anesthesia Checklist: Patient identified, Emergency Drugs available, Suction available and Patient being monitored Patient Re-evaluated:Patient Re-evaluated prior to induction Oxygen  Delivery Method: Nasal cannula Induction Type: IV induction Dental Injury: Teeth and Oropharynx as per pre-operative assessment  Comments: Nasal cannula with etCO2 monitoring

## 2024-11-03 NOTE — Transfer of Care (Signed)
 Immediate Anesthesia Transfer of Care Note  Patient: Lorraine Jones  Procedure(s) Performed: Procedures with comments: COLONOSCOPY (N/A) - IDDM POLYPECTOMY, INTESTINE BIOPSY GI  Patient Location: PACU and Endoscopy Unit  Anesthesia Type:General  Level of Consciousness: sedated  Airway & Oxygen  Therapy: Patient Spontanous Breathing and Patient connected to nasal cannula oxygen   Post-op Assessment: Report given to RN and Post -op Vital signs reviewed and stable  Post vital signs: Reviewed and stable  Last Vitals:  Vitals:   11/03/24 0802 11/03/24 0855  BP: 126/69 136/60  Pulse: 71 81  Resp: 16 (!) 25  Temp: (!) 35.8 C   SpO2: 100% 100%    Complications: No apparent anesthesia complications

## 2024-11-03 NOTE — Op Note (Signed)
 [facility] Gastroenterology Patient Name: Lorraine Jones Procedure Date: 11/03/2024 8:02 AM MRN: [id number] Account #: [id number] Date of Birth: 1950/11/18 Admit Type: Outpatient Age: 74 Room: [facility] ENDO ROOM 1 Gender: Female Note Status: Finalized Instrument Name: Peds Colonoscope [id number] Procedure:             Colonoscopy Indications:           Change in bowel habits, Chronic diarrhea Providers:             Ruel Kung MD, MD Referring MD:          Dorette FALCON. Sowles, MD (Referring MD) Medicines:             Monitored Anesthesia Care Complications:         No immediate complications. Procedure:             Pre-Anesthesia Assessment:                        - Prior to the procedure, a History and Physical was                         performed, and patient medications, allergies and                         sensitivities were reviewed. The patient's tolerance                         of previous anesthesia was reviewed.                        - The risks and benefits of the procedure and the                         sedation options and risks were discussed with the                         patient. All questions were answered and informed                         consent was obtained.                        - ASA Grade Assessment: II - A patient with mild                         systemic disease.                        After obtaining informed consent, the colonoscope was                         passed under direct vision. Throughout the procedure,                         the patient's blood pressure, pulse, and oxygen                          saturations were monitored continuously. The  Colonoscope was introduced through the anus and                         advanced to the the cecum, identified by the                         appendiceal orifice. The colonoscopy was performed                         with ease. The patient tolerated the procedure well.                          The quality of the bowel preparation was excellent. Findings:      The perianal and digital rectal examinations were normal.      A 5 mm polyp was found in the ascending colon. The polyp was sessile.       The polyp was removed with a cold snare. Resection and retrieval were       complete.      Normal mucosa was found in the entire colon. Biopsies were taken with a       cold forceps for histology.      The exam was otherwise without abnormality on direct and retroflexion       views. Impression:            - One 5 mm polyp in the ascending colon, removed with                         a cold snare. Resected and retrieved.                        - Normal mucosa in the entire examined colon. Biopsied.                        - The examination was otherwise normal on direct and                         retroflexion views. Recommendation:        - Discharge patient to home (with escort).                        - Resume previous diet.                        - Continue present medications.                        - Await pathology results.                        - Repeat colonoscopy is not recommended due to current                         age (38 years or older) for surveillance. Procedure Code(s):     --- Professional ---                        [phone], Colonoscopy, flexible; with removal of  tumor(s), polyp(s), or other lesion(s) by snare                         technique                        [id number], 59, Colonoscopy, flexible; with biopsy, single                         or multiple Diagnosis Code(s):     --- Professional ---                        D12.2, Benign neoplasm of ascending colon                        R19.4, Change in bowel habit                        K52.9, Noninfective gastroenteritis and colitis,                         unspecified CPT copyright 2022 American Medical Association. All rights reserved. The codes documented in this  report are preliminary and upon coder review may  be revised to meet current compliance requirements. Ruel Kung, MD Ruel Kung MD, MD 11/03/2024 8:51:46 AM This report has been signed electronically. Number of Addenda: 0 Note Initiated On: 11/03/2024 8:02 AM Scope Withdrawal Time: 0 hours 10 minutes 7 seconds  Total Procedure Duration: 0 hours 16 minutes 9 seconds  Estimated Blood Loss:  Estimated blood loss: none.      [facility]

## 2024-11-04 LAB — SURGICAL PATHOLOGY

## 2024-11-05 ENCOUNTER — Ambulatory Visit: Payer: Self-pay | Admitting: Gastroenterology

## 2025-02-02 ENCOUNTER — Inpatient Hospital Stay

## 2025-03-22 ENCOUNTER — Ambulatory Visit: Admitting: Family Medicine

## 2025-09-01 ENCOUNTER — Ambulatory Visit
